# Patient Record
Sex: Female | Born: 1975 | Race: Black or African American | Hispanic: No | Marital: Married | Smoking: Never smoker
Health system: Southern US, Community
[De-identification: ages and names within clinical notes are randomized; demographics above are authoritative.]

## PROBLEM LIST (undated history)

## (undated) DIAGNOSIS — E119 Type 2 diabetes mellitus without complications: Secondary | ICD-10-CM

## (undated) DIAGNOSIS — Z9221 Personal history of antineoplastic chemotherapy: Secondary | ICD-10-CM

## (undated) DIAGNOSIS — Z923 Personal history of irradiation: Secondary | ICD-10-CM

## (undated) DIAGNOSIS — G709 Myoneural disorder, unspecified: Secondary | ICD-10-CM

## (undated) DIAGNOSIS — C801 Malignant (primary) neoplasm, unspecified: Secondary | ICD-10-CM

## (undated) HISTORY — DX: Myoneural disorder, unspecified: G70.9

## (undated) HISTORY — DX: Type 2 diabetes mellitus without complications: E11.9

## (undated) HISTORY — PX: WISDOM TOOTH EXTRACTION: SHX21

## (undated) HISTORY — PX: MASTECTOMY: SHX3

## (undated) HISTORY — DX: Personal history of irradiation: Z92.3

## (undated) HISTORY — PX: TUBAL LIGATION: SHX77

---

## 2003-09-30 ENCOUNTER — Encounter: Admission: RE | Admit: 2003-09-30 | Discharge: 2003-09-30 | Payer: Self-pay | Admitting: General Surgery

## 2004-10-26 ENCOUNTER — Other Ambulatory Visit: Admission: RE | Admit: 2004-10-26 | Discharge: 2004-10-26 | Payer: Self-pay | Admitting: Family Medicine

## 2005-11-16 ENCOUNTER — Other Ambulatory Visit: Admission: RE | Admit: 2005-11-16 | Discharge: 2005-11-16 | Payer: Self-pay | Admitting: Family Medicine

## 2006-07-18 ENCOUNTER — Emergency Department (HOSPITAL_COMMUNITY): Admission: EM | Admit: 2006-07-18 | Discharge: 2006-07-18 | Payer: Self-pay | Admitting: Pediatrics

## 2007-01-25 ENCOUNTER — Other Ambulatory Visit: Admission: RE | Admit: 2007-01-25 | Discharge: 2007-01-25 | Payer: Self-pay | Admitting: Family Medicine

## 2007-05-28 ENCOUNTER — Other Ambulatory Visit (HOSPITAL_COMMUNITY): Admission: RE | Admit: 2007-05-28 | Discharge: 2007-08-26 | Payer: Self-pay | Admitting: Psychiatry

## 2007-06-01 ENCOUNTER — Ambulatory Visit: Payer: Self-pay | Admitting: Psychiatry

## 2008-01-14 ENCOUNTER — Emergency Department (HOSPITAL_COMMUNITY): Admission: EM | Admit: 2008-01-14 | Discharge: 2008-01-15 | Payer: Self-pay | Admitting: Emergency Medicine

## 2008-07-16 ENCOUNTER — Other Ambulatory Visit: Admission: RE | Admit: 2008-07-16 | Discharge: 2008-07-16 | Payer: Self-pay | Admitting: Obstetrics and Gynecology

## 2008-07-30 ENCOUNTER — Emergency Department (HOSPITAL_COMMUNITY)
Admission: EM | Admit: 2008-07-30 | Discharge: 2008-07-30 | Payer: No Typology Code available for payment source | Admitting: Emergency Medicine

## 2010-02-13 ENCOUNTER — Emergency Department (HOSPITAL_COMMUNITY): Admission: EM | Admit: 2010-02-13 | Discharge: 2010-02-13 | Payer: Self-pay | Admitting: Emergency Medicine

## 2010-05-24 ENCOUNTER — Ambulatory Visit (HOSPITAL_COMMUNITY): Admission: RE | Admit: 2010-05-24 | Discharge: 2010-05-24 | Payer: Self-pay | Admitting: Psychiatry

## 2010-05-26 ENCOUNTER — Other Ambulatory Visit (HOSPITAL_COMMUNITY): Admission: RE | Admit: 2010-05-26 | Discharge: 2010-06-14 | Payer: Self-pay | Admitting: Psychiatry

## 2010-05-27 ENCOUNTER — Ambulatory Visit (HOSPITAL_COMMUNITY): Payer: Self-pay | Admitting: Psychiatry

## 2010-06-15 ENCOUNTER — Ambulatory Visit (HOSPITAL_COMMUNITY): Payer: Self-pay | Admitting: Psychiatry

## 2010-06-17 ENCOUNTER — Ambulatory Visit: Payer: Self-pay | Admitting: Psychiatry

## 2010-06-24 ENCOUNTER — Ambulatory Visit (HOSPITAL_COMMUNITY): Payer: Self-pay | Admitting: Psychiatry

## 2010-07-01 ENCOUNTER — Emergency Department (HOSPITAL_COMMUNITY): Admission: EM | Admit: 2010-07-01 | Discharge: 2010-07-01 | Payer: Self-pay | Admitting: Emergency Medicine

## 2010-07-12 ENCOUNTER — Ambulatory Visit (HOSPITAL_COMMUNITY): Payer: Self-pay | Admitting: Psychiatry

## 2010-07-20 ENCOUNTER — Ambulatory Visit (HOSPITAL_COMMUNITY): Payer: Self-pay | Admitting: Psychiatry

## 2011-01-12 ENCOUNTER — Other Ambulatory Visit (HOSPITAL_COMMUNITY)
Admission: RE | Admit: 2011-01-12 | Discharge: 2011-01-12 | Disposition: A | Discharge status: Home / Self Care | Payer: BC Managed Care – PPO | Source: Ambulatory Visit | Attending: Obstetrics and Gynecology | Admitting: Obstetrics and Gynecology

## 2011-01-12 DIAGNOSIS — Z01419 Encounter for gynecological examination (general) (routine) without abnormal findings: Secondary | ICD-10-CM | POA: Insufficient documentation

## 2011-05-12 LAB — RAPID STREP SCREEN (MED CTR MEBANE ONLY): Streptococcus, Group A Screen (Direct): POSITIVE — AB

## 2011-05-20 LAB — D-DIMER, QUANTITATIVE: D-Dimer, Quant: 0.23 ug/mL-FEU (ref 0.00–0.48)

## 2011-12-04 ENCOUNTER — Encounter (HOSPITAL_COMMUNITY): Payer: Self-pay | Admitting: Emergency Medicine

## 2011-12-04 ENCOUNTER — Emergency Department (HOSPITAL_COMMUNITY): Payer: BC Managed Care – PPO

## 2011-12-04 ENCOUNTER — Emergency Department (HOSPITAL_COMMUNITY)
Admission: EM | Admit: 2011-12-04 | Discharge: 2011-12-04 | Disposition: A | Discharge status: Home / Self Care | Payer: BC Managed Care – PPO | Attending: Emergency Medicine | Admitting: Emergency Medicine

## 2011-12-04 DIAGNOSIS — M25579 Pain in unspecified ankle and joints of unspecified foot: Secondary | ICD-10-CM | POA: Insufficient documentation

## 2011-12-04 DIAGNOSIS — Y9229 Other specified public building as the place of occurrence of the external cause: Secondary | ICD-10-CM | POA: Insufficient documentation

## 2011-12-04 DIAGNOSIS — M79609 Pain in unspecified limb: Secondary | ICD-10-CM | POA: Insufficient documentation

## 2011-12-04 DIAGNOSIS — S92901A Unspecified fracture of right foot, initial encounter for closed fracture: Secondary | ICD-10-CM

## 2011-12-04 DIAGNOSIS — X500XXA Overexertion from strenuous movement or load, initial encounter: Secondary | ICD-10-CM | POA: Insufficient documentation

## 2011-12-04 DIAGNOSIS — S92253A Displaced fracture of navicular [scaphoid] of unspecified foot, initial encounter for closed fracture: Secondary | ICD-10-CM | POA: Insufficient documentation

## 2011-12-04 DIAGNOSIS — S92109A Unspecified fracture of unspecified talus, initial encounter for closed fracture: Secondary | ICD-10-CM | POA: Insufficient documentation

## 2011-12-04 MED ORDER — HYDROCODONE-ACETAMINOPHEN 5-325 MG PO TABS: 1.0000 | ORAL_TABLET | ORAL | Status: AC | PRN

## 2011-12-04 NOTE — Discharge Instructions (Signed)
Take ibuprofen, up to 800mg  three times a day, as needed for pain.  Take vicodin as prescribed for severe pain.   Do not drive within four hours of taking this medication (may cause drowsiness or confusion).  Ice 3 times a day for 15-20 minutes.  Elevate when possible.  Follow up with Dr. Despina Hick.Foot Fracture A fractured foot is a broken bone in your foot. These fractures are usually caused by twisting or crush injuries. Some foot fractures are stress fractures which are due to excess walking or exercise. If the bones are in a good position, foot fractures will usually heal in about 6 weeks. You should keep your foot elevated for the next 2 to 4 days and apply ice packs to the area of the injury for 20 to 30 minutes every 2 to 3 hours until the swelling and pain get better. If you have been placed in a cast or splint, keep it on until you have been checked by your caregiver. Do not walk on a broken foot until bearing weight is relatively painless. Often a cast or podiatric shoe with a stiff sole is used to allow early walking. Repeat X-rays are often needed in 3 to 6 weeks to make sure the fracture is healing. Follow up with your caregiver as recommended.  SEEK IMMEDIATE MEDICAL CARE IF:  You have increased pain, or your toes become cold, numb, or pale.  MAKE SURE YOU:   Understand these instructions.   Will watch your condition.   Will get help right away if you are not doing well or get worse.  Document Released: 09/08/2004 Document Revised: 07/21/2011 Document Reviewed: 09/03/2008 [facility] Patient Information 2012 [place], [place]. You may return to the ER if your pain worsens or you have any other concerns.

## 2011-12-04 NOTE — ED Notes (Signed)
Pt states she fell down some steps this morning and injured her right ankle  Pt has swelling noted  Pt able to walk on it with limp

## 2011-12-04 NOTE — ED Notes (Signed)
Pt transported to xray 

## 2011-12-04 NOTE — ED Provider Notes (Signed)
History     CSN: [id number]  Arrival date & time 12/04/11  2046   First MD Initiated Contact with Patient 12/04/11 2135      Chief Complaint  Patient presents with  . Fall  . Ankle Injury    (Consider location/radiation/quality/duration/timing/severity/associated sxs/prior treatment) HPI History provided by pt.   Pt was walking down the stairs in heels at church at 10am this morning, missed a step and twisted her right ankle.  Has had severe pain that shoots from forefoot up her leg ever since.  Aggravated by bearing weight and no relief w/ ibuprofen.  Associated w/ edema; no paresthesias.   History reviewed. No pertinent past medical history.  History reviewed. No pertinent past surgical history.  Family History  Problem Relation Age of Onset  . Diabetes Brother     History  Substance Use Topics  . Smoking status: Never Smoker   . Smokeless tobacco: Not on file  . Alcohol Use: No    OB History    Grav Para Term Preterm Abortions TAB SAB Ect Mult Living                  Review of Systems  All other systems reviewed and are negative.    Allergies  Review of patient's allergies indicates no known allergies.  Home Medications   Current Outpatient Rx  Name Route Sig Dispense Refill  . IBUPROFEN 200 MG PO TABS Oral Take 800 mg by mouth every 6 (six) hours as needed. Ankle pain      BP 114/91  Pulse 84  Temp(Src) 98.8 F (37.1 C) (Oral)  Resp 18  SpO2 100%  LMP 11/06/2011  Physical Exam  Nursing note and vitals reviewed. Constitutional: She is oriented to person, place, and time. She appears well-developed and well-nourished. No distress.  HENT:  Head: Normocephalic and atraumatic.  Eyes:       Normal appearance  Neck: Normal range of motion.  Musculoskeletal:       Right ankle and foot w/out deformity.  Edema of dorsal surface of foot and lateral ankle.  Tenderness over 1st-3rd metatarsals and entire lateral ankle w/ guarding.  Pain w/ minimal  passive ROM ankle.  Full active ROM toes.  2+ DP pulse and distal sensation intact.   Neurological: She is alert and oriented to person, place, and time.  Psychiatric: She has a normal mood and affect. Her behavior is normal.    ED Course  Procedures (including critical care time)  Labs Reviewed - No data to display Dg Ankle Complete Right  12/04/2011  *RADIOLOGY REPORT*  Clinical Data: Lateral right foot and ankle pain after fall.  RIGHT ANKLE - COMPLETE 3+ VIEW  Comparison: None.  Findings: Mild soft tissue swelling.  Tiny osseous densities projecting superior to the distal talus and proximal navicular suggesting avulsion fragments.  No displaced fractures of the ankle are identified.  No focal bone lesion or bone destruction.  The no radiopaque soft tissue foreign bodies.  Minimal Achilles calcaneal spur.  IMPRESSION: Tiny bone fragments over the dorsal aspect of the distal talus and proximal navicular suggesting avulsion fractures.  Original Report Authenticated By: Marlon Pel, M.D.   Dg Foot Complete Right  12/04/2011  *RADIOLOGY REPORT*  Clinical Data: Lateral right foot and ankle pain after fall.  RIGHT FOOT COMPLETE - 3+ VIEW  Comparison: None.  Findings: Small osseous fragments again demonstrated over the distal talus and proximal navicular consistent with avulsion fractures.  Suggestion of focal  cortical irregularity of the distal aspect of the proximal phalanx of the fifth toe which may represent normal variation.  If the patient is tender in this area, nondisplaced fracture is not excluded.  No additional fractures demonstrated.  No focal bone lesion or bone destruction.  No radiopaque soft tissue foreign bodies.  IMPRESSION: Small avulsion fracture fragments demonstrated over the dorsal aspect of the distal talus and proximal navicular.  The focal irregularity of the cortex of the distal aspect of the proximal phalanx of the fifth toe which might represent normal variation versus  nondisplaced fracture.  Clinical correlation with site of pain is recommended.  Original Report Authenticated By: Marlon Pel, M.D.     1. Fracture of right foot       MDM  Pt presents w/ right foot/ankle injury.  Significant tenderness and edema and limited ankle ROM on exam.  No NV deficits.  Xrays ordered to r/o fracture/dislocation and show tiny avulsion fracture of talus vs. Navicular bone as well as possible non-displaced fx of distal aspect of proximal 5th right phalanx.  On re-examination, pt does have tenderness in this location.  Ortho tech placed in short leg and stirrup splint and provided her with crutches.  Pt d/c'd home w/ vicodin and referral to ortho.  Recommended NSAID, ice and elevation.          Otilio Miu, [place] 12/04/11 2253

## 2011-12-04 NOTE — ED Provider Notes (Signed)
Medical screening examination/treatment/procedure(s) were performed by non-physician practitioner and as supervising physician I was immediately available for consultation/collaboration.  Toy Baker, MD 12/04/11 2325

## 2011-12-04 NOTE — ED Notes (Signed)
Otho tech contacted

## 2012-02-09 ENCOUNTER — Emergency Department (HOSPITAL_COMMUNITY)
Admission: EM | Admit: 2012-02-09 | Discharge: 2012-02-10 | Disposition: A | Discharge status: Home / Self Care | Payer: BC Managed Care – PPO | Attending: Emergency Medicine | Admitting: Emergency Medicine

## 2012-02-09 ENCOUNTER — Encounter (HOSPITAL_COMMUNITY): Payer: Self-pay | Admitting: *Deleted

## 2012-02-09 DIAGNOSIS — N949 Unspecified condition associated with female genital organs and menstrual cycle: Secondary | ICD-10-CM | POA: Insufficient documentation

## 2012-02-09 DIAGNOSIS — N898 Other specified noninflammatory disorders of vagina: Secondary | ICD-10-CM | POA: Insufficient documentation

## 2012-02-09 DIAGNOSIS — R102 Pelvic and perineal pain: Secondary | ICD-10-CM

## 2012-02-09 LAB — CBC WITH DIFFERENTIAL/PLATELET
Basophils Absolute: 0 10*3/uL (ref 0.0–0.1)
Basophils Relative: 0 % (ref 0–1)
Eosinophils Absolute: 0.1 10*3/uL (ref 0.0–0.7)
Eosinophils Relative: 1 % (ref 0–5)
HCT: 37.3 % (ref 36.0–46.0)
Hemoglobin: 12 g/dL (ref 12.0–15.0)
Lymphocytes Relative: 39 % (ref 12–46)
Lymphs Abs: 3.3 10*3/uL (ref 0.7–4.0)
MCH: 26.7 pg (ref 26.0–34.0)
MCHC: 32.2 g/dL (ref 30.0–36.0)
MCV: 83.1 fL (ref 78.0–100.0)
Monocytes Absolute: 0.5 10*3/uL (ref 0.1–1.0)
Monocytes Relative: 6 % (ref 3–12)
Neutro Abs: 4.6 10*3/uL (ref 1.7–7.7)
Neutrophils Relative %: 54 % (ref 43–77)
Platelets: 228 10*3/uL (ref 150–400)
RBC: 4.49 MIL/uL (ref 3.87–5.11)
RDW: 13.8 % (ref 11.5–15.5)
WBC: 8.4 10*3/uL (ref 4.0–10.5)

## 2012-02-09 LAB — COMPREHENSIVE METABOLIC PANEL
ALT: 10 U/L (ref 0–35)
AST: 10 U/L (ref 0–37)
Albumin: 3.7 g/dL (ref 3.5–5.2)
Alkaline Phosphatase: 46 U/L (ref 39–117)
BUN: 14 mg/dL (ref 6–23)
CO2: 24 mEq/L (ref 19–32)
Calcium: 9.8 mg/dL (ref 8.4–10.5)
Chloride: 99 mEq/L (ref 96–112)
Creatinine, Ser: 0.57 mg/dL (ref 0.50–1.10)
GFR calc Af Amer: >90 mL/min (ref >90)
GFR calc non Af Amer: >90 mL/min (ref >90)
Glucose, Bld: 288 mg/dL — ABNORMAL HIGH (ref 70–99)
Potassium: 4 mEq/L (ref 3.5–5.1)
Sodium: 134 mEq/L — ABNORMAL LOW (ref 135–145)
Total Bilirubin: 0.3 mg/dL (ref 0.3–1.2)
Total Protein: 7 g/dL (ref 6.0–8.3)

## 2012-02-09 MED ORDER — HYDROCODONE-ACETAMINOPHEN 5-325 MG PO TABS
2.0000 | ORAL_TABLET | Freq: Once | ORAL | Status: AC
  Administered 2012-02-10: 2 via ORAL
  Filled 2012-02-09: qty 2

## 2012-02-09 NOTE — ED Notes (Signed)
Pt states she started to have left pelvic pain about 4 days ago. Pt states is having burning with urination. Pt denies any other symptoms

## 2012-02-09 NOTE — ED Provider Notes (Signed)
History     CSN: [id number]  Arrival date & time 02/09/12  2210   First MD Initiated Contact with Patient 02/09/12 2344      Chief Complaint  Patient presents with  . Pelvic Pain    (Consider location/radiation/quality/duration/timing/severity/associated sxs/prior treatment) Patient is a 36 y.o. female presenting with pelvic pain. The history is provided by the patient.  Pelvic Pain Pertinent negatives include no chest pain, no headaches and no shortness of breath.  pt c/o pelvic pain for past 3-4 days. Constant, dull, non radiating. No back or flank pain. Mild dysuria and vaginal discharge. States had normal period approx 2 weeks ago. ?hx ovarian cyst, pt unsure if same. Denies hx fibroids or endometriosis. No abd trauma. Having normal bms. Normal appetite. No nvd. No fever or chills.     History reviewed. No pertinent past medical history.  History reviewed. No pertinent past surgical history.  Family History  Problem Relation Age of Onset  . Diabetes Brother     History  Substance Use Topics  . Smoking status: Never Smoker   . Smokeless tobacco: Not on file  . Alcohol Use: No    OB History    Grav Para Term Preterm Abortions TAB SAB Ect Mult Living                  Review of Systems  Constitutional: Negative for fever and chills.  HENT: Negative for neck pain.   Eyes: Negative for redness.  Respiratory: Negative for cough and shortness of breath.   Cardiovascular: Negative for chest pain.  Gastrointestinal: Negative for vomiting, diarrhea and constipation.  Genitourinary: Positive for pelvic pain. Negative for flank pain.  Musculoskeletal: Negative for back pain.  Skin: Negative for rash.  Neurological: Negative for headaches.  Hematological: Does not bruise/bleed easily.  Psychiatric/Behavioral: Negative for confusion.    Allergies  Review of patient's allergies indicates no known allergies.  Home Medications  No current outpatient prescriptions on  file.  BP 130/84  Pulse 94  Temp 98.1 F (36.7 C) (Oral)  Resp 18  SpO2 98%  LMP 01/25/2012  Physical Exam  Nursing note and vitals reviewed. Constitutional: She is oriented to person, place, and time. She appears well-developed and well-nourished. No distress.  Eyes: Conjunctivae are normal. No scleral icterus.  Neck: Neck supple. No tracheal deviation present.  Cardiovascular: Normal rate.   Pulmonary/Chest: Effort normal. No respiratory distress.  Abdominal: Soft. Normal appearance and bowel sounds are normal. She exhibits no distension and no mass. There is no tenderness. There is no rebound and no guarding.       No hernias.   Genitourinary:       No cva tenderness. Ext gen normal. Mild whitish discharge. No cmt. No focal adn tenderness.   Musculoskeletal: She exhibits no edema and no tenderness.  Neurological: She is alert and oriented to person, place, and time.  Skin: Skin is warm and dry. No rash noted.  Psychiatric: She has a normal mood and affect.    ED Course  Procedures (including critical care time)   Labs Reviewed  CBC WITH DIFFERENTIAL  COMPREHENSIVE METABOLIC PANEL  URINALYSIS, ROUTINE W REFLEX MICROSCOPIC  PREGNANCY, URINE  WET PREP, GENITAL  GC/CHLAMYDIA PROBE AMP, GENITAL    Results for orders placed during the hospital encounter of 02/09/12  CBC WITH DIFFERENTIAL      Component Value Range   WBC 8.4  4.0 - 10.5 K/uL   RBC 4.49  3.87 - 5.11 MIL/uL  Hemoglobin 12.0  12.0 - 15.0 g/dL   HCT 56.2  13.0 - 86.5 %   MCV 83.1  78.0 - 100.0 fL   MCH 26.7  26.0 - 34.0 pg   MCHC 32.2  30.0 - 36.0 g/dL   RDW 78.4  69.6 - 29.5 %   Platelets 228  150 - 400 K/uL   Neutrophils Relative 54  43 - 77 %   Neutro Abs 4.6  1.7 - 7.7 K/uL   Lymphocytes Relative 39  12 - 46 %   Lymphs Abs 3.3  0.7 - 4.0 K/uL   Monocytes Relative 6  3 - 12 %   Monocytes Absolute 0.5  0.1 - 1.0 K/uL   Eosinophils Relative 1  0 - 5 %   Eosinophils Absolute 0.1  0.0 - 0.7 K/uL    Basophils Relative 0  0 - 1 %   Basophils Absolute 0.0  0.0 - 0.1 K/uL  COMPREHENSIVE METABOLIC PANEL      Component Value Range   Sodium 134 (*) 135 - 145 mEq/L   Potassium 4.0  3.5 - 5.1 mEq/L   Chloride 99  96 - 112 mEq/L   CO2 24  19 - 32 mEq/L   Glucose, Bld 288 (*) 70 - 99 mg/dL   BUN 14  6 - 23 mg/dL   Creatinine, Ser 2.84  0.50 - 1.10 mg/dL   Calcium 9.8  8.4 - 13.2 mg/dL   Total Protein 7.0  6.0 - 8.3 g/dL   Albumin 3.7  3.5 - 5.2 g/dL   AST 10  0 - 37 U/L   ALT 10  0 - 35 U/L   Alkaline Phosphatase 46  39 - 117 U/L   Total Bilirubin 0.3  0.3 - 1.2 mg/dL   GFR calc non Af Amer >90  >90 mL/min   GFR calc Af Amer >90  >90 mL/min  URINALYSIS, ROUTINE W REFLEX MICROSCOPIC      Component Value Range   Color, Urine YELLOW  YELLOW   APPearance CLOUDY (*) CLEAR   Specific Gravity, Urine 1.042 (*) 1.005 - 1.030   pH 6.0  5.0 - 8.0   Glucose, UA >1000 (*) NEGATIVE mg/dL   Hgb urine dipstick NEGATIVE  NEGATIVE   Bilirubin Urine NEGATIVE  NEGATIVE   Ketones, ur TRACE (*) NEGATIVE mg/dL   Protein, ur NEGATIVE  NEGATIVE mg/dL   Urobilinogen, UA 1.0  0.0 - 1.0 mg/dL   Nitrite NEGATIVE  NEGATIVE   Leukocytes, UA NEGATIVE  NEGATIVE  WET PREP, GENITAL      Component Value Range   Yeast Wet Prep HPF POC NONE SEEN  NONE SEEN   Trich, Wet Prep NONE SEEN  NONE SEEN   Clue Cells Wet Prep HPF POC NONE SEEN  NONE SEEN   WBC, Wet Prep HPF POC FEW (*) NONE SEEN  URINE MICROSCOPIC-ADD ON      Component Value Range   Squamous Epithelial / LPF RARE  RARE   WBC, UA 0-2  <3 WBC/hpf   RBC / HPF 0-2  <3 RBC/hpf   Bacteria, UA RARE  RARE  POCT PREGNANCY, URINE      Component Value Range   Preg Test, Ur NEGATIVE  NEGATIVE   US Transvaginal Non-ob  02/10/2012  *RADIOLOGY REPORT*  Clinical Data:  Right-sided pelvic pain.  TRANSABDOMINAL AND TRANSVAGINAL ULTRASOUND OF PELVIS DOPPLER ULTRASOUND OF OVARIES  Technique:  Both transabdominal and transvaginal ultrasound examinations of the pelvis were  performed. Transabdominal  technique was performed for global imaging of the pelvis including uterus, ovaries, adnexal regions, and pelvic cul-de-sac.  It was necessary to proceed with endovaginal exam following the transabdominal exam to visualize the ovaries and endometrium.  Color and duplex Doppler ultrasound was utilized to evaluate blood flow to the ovaries.  Comparison:  None.  Findings:  Uterus:  The uterus is retroverted and measures 8.3 x 5 x 5.5 cm. Small cysts in the cervix consistent with Nabothian cyst.  Endometrium:  Normal endometrial stripe thickness measured at 0.9 mm.  Right ovary: The right ovary measures 3.8 x 2.5 x 2.1 cm.  Normal follicular changes.  Flow is demonstrated within the ovary on color flow Doppler imaging.  Left ovary:     The left ovary measures 3.4 x 1.6 x 2.2 cm.  Normal follicular changes.  Flow is demonstrated in the left ovary using color flow Doppler imaging.  Small amount of free fluid in the pelvis.  Pulsed Doppler evaluation demonstrates normal low-resistance arterial and venous waveforms in both ovaries.  IMPRESSION: Free fluid in the pelvis.  Otherwise normal exam.  No evidence of pelvic mass or other significant abnormality.  No sonographic evidence for ovarian torsion.  Original Report Authenticated By: Marlon Pel, M.D.   US Pelvis Complete  02/10/2012  *RADIOLOGY REPORT*  Clinical Data:  Right-sided pelvic pain.  TRANSABDOMINAL AND TRANSVAGINAL ULTRASOUND OF PELVIS DOPPLER ULTRASOUND OF OVARIES  Technique:  Both transabdominal and transvaginal ultrasound examinations of the pelvis were performed. Transabdominal technique was performed for global imaging of the pelvis including uterus, ovaries, adnexal regions, and pelvic cul-de-sac.  It was necessary to proceed with endovaginal exam following the transabdominal exam to visualize the ovaries and endometrium.  Color and duplex Doppler ultrasound was utilized to evaluate blood flow to the ovaries.  Comparison:   None.  Findings:  Uterus:  The uterus is retroverted and measures 8.3 x 5 x 5.5 cm. Small cysts in the cervix consistent with Nabothian cyst.  Endometrium:  Normal endometrial stripe thickness measured at 0.9 mm.  Right ovary: The right ovary measures 3.8 x 2.5 x 2.1 cm.  Normal follicular changes.  Flow is demonstrated within the ovary on color flow Doppler imaging.  Left ovary:     The left ovary measures 3.4 x 1.6 x 2.2 cm.  Normal follicular changes.  Flow is demonstrated in the left ovary using color flow Doppler imaging.  Small amount of free fluid in the pelvis.  Pulsed Doppler evaluation demonstrates normal low-resistance arterial and venous waveforms in both ovaries.  IMPRESSION: Free fluid in the pelvis.  Otherwise normal exam.  No evidence of pelvic mass or other significant abnormality.  No sonographic evidence for ovarian torsion.  Original Report Authenticated By: Marlon Pel, M.D.   [place] Art/ven Flow Abd Pelv Doppler  02/10/2012  *RADIOLOGY REPORT*  Clinical Data:  Right-sided pelvic pain.  TRANSABDOMINAL AND TRANSVAGINAL ULTRASOUND OF PELVIS DOPPLER ULTRASOUND OF OVARIES  Technique:  Both transabdominal and transvaginal ultrasound examinations of the pelvis were performed. Transabdominal technique was performed for global imaging of the pelvis including uterus, ovaries, adnexal regions, and pelvic cul-de-sac.  It was necessary to proceed with endovaginal exam following the transabdominal exam to visualize the ovaries and endometrium.  Color and duplex Doppler ultrasound was utilized to evaluate blood flow to the ovaries.  Comparison:  None.  Findings:  Uterus:  The uterus is retroverted and measures 8.3 x 5 x 5.5 cm. Small cysts in the cervix consistent with Nabothian  cyst.  Endometrium:  Normal endometrial stripe thickness measured at 0.9 mm.  Right ovary: The right ovary measures 3.8 x 2.5 x 2.1 cm.  Normal follicular changes.  Flow is demonstrated within the ovary on color flow Doppler  imaging.  Left ovary:     The left ovary measures 3.4 x 1.6 x 2.2 cm.  Normal follicular changes.  Flow is demonstrated in the left ovary using color flow Doppler imaging.  Small amount of free fluid in the pelvis.  Pulsed Doppler evaluation demonstrates normal low-resistance arterial and venous waveforms in both ovaries.  IMPRESSION: Free fluid in the pelvis.  Otherwise normal exam.  No evidence of pelvic mass or other significant abnormality.  No sonographic evidence for ovarian torsion.  Original Report Authenticated By: Marlon Pel, M.D.      MDM  Labs. Pt says family member here who can drive her home. vicodin po.   Pt notes hx ovarian cyst. Pain initially persisting. U/s ordered.  Recheck -  Pt comfortable. Denies pain. No nv. abd soft non tender on re-exam.   Discussed close pcp follow up with pt and to return to ER if worsening or increased pain, fevers, vomiting.         Suzi Roots, MD 02/10/12 [phone]

## 2012-02-10 ENCOUNTER — Emergency Department (HOSPITAL_COMMUNITY): Payer: BC Managed Care – PPO

## 2012-02-10 LAB — URINALYSIS, ROUTINE W REFLEX MICROSCOPIC
Bilirubin Urine: NEGATIVE
Glucose, UA: >1000 mg/dL — AB
Hgb urine dipstick: NEGATIVE
Leukocytes, UA: NEGATIVE
Nitrite: NEGATIVE
Protein, ur: NEGATIVE mg/dL
Specific Gravity, Urine: 1.042 — ABNORMAL HIGH (ref 1.005–1.030)
Urobilinogen, UA: 1 mg/dL (ref 0.0–1.0)
pH: 6 (ref 5.0–8.0)

## 2012-02-10 LAB — POCT PREGNANCY, URINE: Preg Test, Ur: NEGATIVE

## 2012-02-10 LAB — WET PREP, GENITAL
Clue Cells Wet Prep HPF POC: NONE SEEN
Trich, Wet Prep: NONE SEEN
Yeast Wet Prep HPF POC: NONE SEEN

## 2012-02-10 LAB — URINE MICROSCOPIC-ADD ON

## 2012-02-10 MED ORDER — IBUPROFEN 600 MG PO TABS: 600.0000 mg | ORAL_TABLET | Freq: Three times a day (TID) | ORAL | Status: AC | PRN

## 2012-02-10 NOTE — ED Notes (Signed)
Dr. Denton Lank in room to perform pelvic exam, accompanied by nurse tech. Pt tolerated well.

## 2012-02-10 NOTE — Discharge Instructions (Signed)
You may take motrin as need for pain.  Follow up with primary care doctor, or here, for recheck in 1 days time if symptoms fail to improve/resolve (return right away if worse, worsening or severe abdominal pain, persistent vomiting, fevers, other concern). Your blood sugar is high today (288). Drink plenty of water, follow diabetic diet, and follow up with primary care doctor for recheck in the next couple days  - discuss this result with them. You were given pain medication in the ER - no driving for the next 4 hours.    Abdominal Pain, Women Abdominal (stomach, pelvic, or belly) pain can be caused by many things. It is important to tell your doctor:  The location of the pain.   Does it come and go or is it present all the time?   Are there things that start the pain (eating certain foods, exercise)?   Are there other symptoms associated with the pain (fever, nausea, vomiting, diarrhea)?  All of this is helpful to know when trying to find the cause of the pain. CAUSES   Stomach: virus or bacteria infection, or ulcer.   Intestine: appendicitis (inflamed appendix), regional ileitis (Crohn's disease), ulcerative colitis (inflamed colon), irritable bowel syndrome, diverticulitis (inflamed diverticulum of the colon), or cancer of the stomach or intestine.   Gallbladder disease or stones in the gallbladder.   Kidney disease, kidney stones, or infection.   Pancreas infection or cancer.   Fibromyalgia (pain disorder).   Diseases of the female organs:   Uterus: fibroid (non-cancerous) tumors or infection.   Fallopian tubes: infection or tubal pregnancy.   Ovary: cysts or tumors.   Pelvic adhesions (scar tissue).   Endometriosis (uterus lining tissue growing in the pelvis and on the pelvic organs).   Pelvic congestion syndrome (female organs filling up with blood just before the menstrual period).   Pain with the menstrual period.   Pain with ovulation (producing an egg).    Pain with an IUD (intrauterine device, birth control) in the uterus.   Cancer of the female organs.   Functional pain (pain not caused by a disease, may improve without treatment).   Psychological pain.   Depression.  DIAGNOSIS  Your doctor will decide the seriousness of your pain by doing an examination.  Blood tests.   X-rays.   Ultrasound.   CT scan (computed tomography, special type of X-ray).   MRI (magnetic resonance imaging).   Cultures, for infection.   Barium enema (dye inserted in the large intestine, to better view it with X-rays).   Colonoscopy (looking in intestine with a lighted tube).   Laparoscopy (minor surgery, looking in abdomen with a lighted tube).   Major abdominal exploratory surgery (looking in abdomen with a large incision).  TREATMENT  The treatment will depend on the cause of the pain.   Many cases can be observed and treated at home.   Over-the-counter medicines recommended by your caregiver.   Prescription medicine.   Antibiotics, for infection.   Birth control pills, for painful periods or for ovulation pain.   Hormone treatment, for endometriosis.   Nerve blocking injections.   Physical therapy.   Antidepressants.   Counseling with a psychologist or psychiatrist.   Minor or major surgery.  HOME CARE INSTRUCTIONS   Do not take laxatives, unless directed by your caregiver.   Take over-the-counter pain medicine only if ordered by your caregiver. Do not take aspirin because it can cause an upset stomach or bleeding.   Try a  clear liquid diet (broth or water) as ordered by your caregiver. Slowly move to a bland diet, as tolerated, if the pain is related to the stomach or intestine.   Have a thermometer and take your temperature several times a day, and record it.   Bed rest and sleep, if it helps the pain.   Avoid sexual intercourse, if it causes pain.   Avoid stressful situations.   Keep your follow-up appointments  and tests, as your caregiver orders.   If the pain does not go away with medicine or surgery, you may try:   Acupuncture.   Relaxation exercises (yoga, meditation).   Group therapy.   Counseling.  SEEK MEDICAL CARE IF:   You notice certain foods cause stomach pain.   Your home care treatment is not helping your pain.   You need stronger pain medicine.   You want your IUD removed.   You feel faint or lightheaded.   You develop nausea and vomiting.   You develop a rash.   You are having side effects or an allergy to your medicine.  SEEK IMMEDIATE MEDICAL CARE IF:   Your pain does not go away or gets worse.   You have a fever.   Your pain is felt only in portions of the abdomen. The right side could possibly be appendicitis. The left lower portion of the abdomen could be colitis or diverticulitis.   You are passing blood in your stools (bright red or black tarry stools, with or without vomiting).   You have blood in your urine.   You develop chills, with or without a fever.   You pass out.  MAKE SURE YOU:   Understand these instructions.   Will watch your condition.   Will get help right away if you are not doing well or get worse.  Document Released: 05/29/2007 Document Revised: 07/21/2011 Document Reviewed: 06/18/2009 [facility] Patient Information 2012 [place], [place].       Hyperglycemia Hyperglycemia occurs when the glucose (sugar) in your blood is too high. Hyperglycemia can happen for many reasons, but it most often happens to people who do not know they have diabetes or are not managing their diabetes properly.  CAUSES  Whether you have diabetes or not, there are other causes of hyperglycemia. Hyperglycemia can occur when you have diabetes, but it can also occur in other situations that you might not be as aware of, such as: Diabetes  If you have diabetes and are having problems controlling your blood glucose, hyperglycemia could occur because of  some of the following reasons:   Not following your meal plan.   Not taking your diabetes medications or not taking it properly.   Exercising less or doing less activity than you normally do.   Being sick.  Pre-diabetes  This cannot be ignored. Before people develop Type 2 diabetes, they almost always have "pre-diabetes." This is when your blood glucose levels are higher than normal, but not yet high enough to be diagnosed as diabetes. Research has shown that some long-term damage to the body, especially the heart and circulatory system, may already be occurring during pre-diabetes. If you take action to manage your blood glucose when you have pre-diabetes, you may delay or prevent Type 2 diabetes from developing.  Stress  If you have diabetes, you may be "diet" controlled or on oral medications or insulin to control your diabetes. However, you may find that your blood glucose is higher than usual in the hospital whether you have  diabetes or not. This is often referred to as "stress hyperglycemia." Stress can elevate your blood glucose. This happens because of hormones put out by the body during times of stress. If stress has been the cause of your high blood glucose, it can be followed regularly by your caregiver. That way he/she can make sure your hyperglycemia does not continue to get worse or progress to diabetes.  Steroids  Steroids are medications that act on the infection fighting system (immune system) to block inflammation or infection. One side effect can be a rise in blood glucose. Most people can produce enough extra insulin to allow for this rise, but for those who cannot, steroids make blood glucose levels go even higher. It is not unusual for steroid treatments to "uncover" diabetes that is developing. It is not always possible to determine if the hyperglycemia will go away after the steroids are stopped. A special blood test called an A1c is sometimes done to determine if your blood  glucose was elevated before the steroids were started.  SYMPTOMS  Thirsty.   Frequent urination.   Dry mouth.   Blurred vision.   Tired or fatigue.   Weakness.   Sleepy.   Tingling in feet or leg.  DIAGNOSIS  Diagnosis is made by monitoring blood glucose in one or all of the following ways:  A1c test. This is a chemical found in your blood.   Fingerstick blood glucose monitoring.   Laboratory results.  TREATMENT  First, knowing the cause of the hyperglycemia is important before the hyperglycemia can be treated. Treatment may include, but is not be limited to:  Education.   Change or adjustment in medications.   Change or adjustment in meal plan.   Treatment for an illness, infection, etc.   More frequent blood glucose monitoring.   Change in exercise plan.   Decreasing or stopping steroids.   Lifestyle changes.  HOME CARE INSTRUCTIONS   Test your blood glucose as directed.   Exercise regularly. Your caregiver will give you instructions about exercise. Pre-diabetes or diabetes which comes on with stress is helped by exercising.   Eat wholesome, balanced meals. Eat often and at regular, fixed times. Your caregiver or nutritionist will give you a meal plan to guide your sugar intake.   Being at an ideal weight is important. If needed, losing as little as 10 to 15 pounds may help improve blood glucose levels.  SEEK MEDICAL CARE IF:   You have questions about medicine, activity, or diet.   You continue to have symptoms (problems such as increased thirst, urination, or weight gain).  SEEK IMMEDIATE MEDICAL CARE IF:   You are vomiting or have diarrhea.   Your breath smells fruity.   You are breathing faster or slower.   You are very sleepy or incoherent.   You have numbness, tingling, or pain in your feet or hands.   You have chest pain.   Your symptoms get worse even though you have been following your caregiver's orders.   If you have any other  questions or concerns.  Document Released: 01/25/2001 Document Revised: 07/21/2011 Document Reviewed: 03/23/2009 [facility] Patient Information 2012 [place], [place].     1800 Calorie Diabetic Diet The 1800 calorie diabetic diet is designed for eating up to 1800 calories each day. Following this diet and making healthy meal choices can help improve overall health. It controls blood glucose (sugar) levels, and it can also help lower blood pressure and cholesterol. SERVING SIZES Measuring foods and  serving sizes helps to make sure you are getting the right amount of food. The list below tells how big or small some common serving sizes are:  1 oz.........4 stacked dice.   3 oz........Marland KitchenDeck of cards.   1 tsp.......Marland KitchenTip of little finger.   1 tbs......Marland KitchenMarland KitchenThumb.   2 tbs.......Marland KitchenGolf ball.    cup......Marland KitchenHalf of a fist.   1 cup.......Marland KitchenA fist.  GUIDELINES FOR CHOOSING FOODS The goal of this diet is to eat a variety of foods and limit calories to 1800 each day. This can be done by choosing foods that are low in calories and fat. The diet also suggests eating small amounts of food frequently. Doing this helps control your blood glucose levels so they do not get too high or too low. Each meal or snack may include a protein food source to help you feel more satisfied. Try to eat about the same amount of food around the same time each day. This includes weekend days, travel days, and days off work. Space your meals about 4 to 5 hours apart, and add a snack between them, if you wish.  For example, a daily food plan could include breakfast, a morning snack, lunch, dinner, and an evening snack. Healthy meals and snacks have different types of foods, including whole grains, vegetables, fruits, lean meats, poultry, fish, and dairy products. As you plan your meals, select a variety of foods. Choose from the bread and starch, vegetable, fruit, dairy, and meat/protein groups. Examples of foods from each group are  listed below with their suggested serving sizes. Use measuring cups and spoons to become familiar with what a healthy portion looks like. Bread and Starch Each serving equals 15 grams of carbohydrates.  1 slice bread.    bagel.    cup cold cereal (unsweetened).    cup hot cereal or mashed potatoes.   1 small potato (size of a computer mouse).   ? cup cooked pasta or rice.    English muffin.   1 cup broth-based soup.   3 cups of popcorn.   4 to 6 whole-wheat crackers.    cup cooked beans, peas, or corn.  Vegetables Each serving equals 5 grams of carbohydrates.   cup cooked vegetables.   1 cup raw vegetables.    cup tomato or vegetable juice.  Fruit Each serving equals 15 grams of carbohydrates.  1 small apple or orange.   1  cup watermelon or strawberries.    cup applesauce (no sugar added).   2 tbs raisins.    banana.    cup canned fruit, packed in water or in its own juice.    cup unsweetened fruit juice.  Dairy Each serving equals 12 to 15 grams of carbohydrates.  1 cup fat-free milk.   6 oz artificially sweetened yogurt or plain yogurt.   1 cup low-fat buttermilk.   1 cup soy milk.   1 cup almond milk.  Meat/Protein  1 large egg.   2 to 3 oz meat, poultry, or fish.    cup low-fat cottage cheese.   1 tbs peanut butter.   1 oz low-fat cheese.    cup tuna, packed in water.    cup tofu.  Fat  1 tsp oil.   1 tsp trans-fat-free margarine.   1 tsp butter.   1 tsp mayonnaise.   2 tbs avocado.   1 tbs salad dressing.   1 tbs cream cheese.   2 tbs sour cream.  SAMPLE 1800 CALORIE DIET PLAN  Breakfast   cup unsweetened cereal (1 carb serving).   1 cup fat-free milk (1 carb serving).   1 slice whole-wheat toast (1 carb serving).    small banana (1 carb serving).   1 scrambled egg.   1 tsp trans-fat-free margarine.  Lunch  Tuna sandwich.   2 slices whole-wheat bread (2 carb servings).    cup canned  tuna in water, drained.   1 tbs reduced fat mayonnaise.   1 stalk celery, chopped.   2 slices tomato.   1 lettuce leaf.   1 cup carrot sticks.   24 to 30 seedless grapes (2 carb servings).   6 oz light yogurt (1 carb serving).  Afternoon Snack  3 graham cracker squares (1 carb serving).   1 cup fat-free milk (1 carb serving).   1 tbs peanut butter.  Dinner  3 oz salmon, broiled with 1 tsp oil.   1 cup mashed potatoes (2 carb servings) with 1 tsp trans-fat-free margarine.   1 cup fresh or frozen green beans.   1 cup steamed asparagus.   1 cup fat-free milk (1 carb serving).  Evening Snack  3 cups of air-popped popcorn (1 carb serving).   2 tbs Parmesan cheese.  Meal Plan You can use this worksheet to help you make a daily meal plan based on the 1800 calorie diabetic diet suggestions. If you are using this plan to help you control your blood glucose, you may interchange carbohydrate-containing foods (dairy, starches, and fruits). Select a variety of fresh foods of varying colors and flavors. The total amount of carbohydrate in your meals or snacks is more important than making sure you include all of the food groups every time you eat. Choose from the approximate amount of the following foods to build your day's meals:  8 Starches.   4 Vegetables.   3 Fruits.   2 Dairy.   6 to 7 oz Meat/Protein.   Up to 4 Fats.  Your dietician can use this worksheet to help you decide how many servings and which types of foods are right for you. BREAKFAST Food Group and Servings / Food Choice Starches _______________________________________________________ Dairy __________________________________________________________ Fruit ___________________________________________________________ Meat/Protein ____________________________________________________ Fat ____________________________________________________________ LUNCH Food Group and Servings / Food Choice Starch  _________________________________________________________ Meat/Protein ___________________________________________________ Vegetables _____________________________________________________ Fruit __________________________________________________________ Dairy __________________________________________________________ Fat ____________________________________________________________ Aura Fey Food Group and Servings / Food Choice Starch ________________________________________________________ Meat/Protein ___________________________________________________ Fruit __________________________________________________________ Dairy __________________________________________________________ Laural Golden Food Group and Servings / Food Choice Starches _______________________________________________________ Meat/Protein ___________________________________________________ Dairy __________________________________________________________ Vegetable ______________________________________________________ Fruit ___________________________________________________________ Fat ____________________________________________________________ Lollie Sails Food Group and Servings / Food Choice Fruit __________________________________________________________ Meat/Protein ___________________________________________________ Dairy __________________________________________________________ Starch _________________________________________________________ DAILY TOTALS Starches _________________________ Vegetables _______________________ Fruits ____________________________ Dairy ____________________________ Meat/Protein_____________________ Fats _____________________________ Document Released: 02/21/2005 Document Revised: 07/21/2011 Document Reviewed: 06/17/2011 [employer] Patient Information 2012 [place], [place].       RESOURCE GUIDE  Chronic Pain Problems: Contact [facility]   [phone] Patients need to be referred by their primary care doctor.  Insufficient Money for Medicine: Contact United Way:  call "211" or [facility] [phone].  No Primary Care Doctor: - Call [facility]  [phone] - can help you locate a primary care doctor that  accepts your insurance, provides certain services, etc. - Physician Referral Service- [phone]  Agencies that provide inexpensive medical care: - [facility]  [phone] - [facility]  [phone] - [facility]  [phone] - Women's Clinic  [phone] - Planned Parenthood  [phone] [facility]  [phone]  Medicaid-accepting [facility] Providers: - [facility]- [address]  [phone], Mon-Fri 9am-7pm, Sat 9am-1pm - [facility]- [address] [place], [place]  [phone] - [facility]- [address] [place] [address] [place]  [phone] [facility]- [address]  [phone] - Renaye Rakers- [address] [place], [address], [phone]  Only accepts [place] patients after they have their name  applied to their card  Self Pay (no insurance) in [place]: - Sickle Cell Patients: Dr Willey Blade, [facility]  [address] [place], [phone] - [facility]- [address] [place]  [phone]       [facility] [place]- [address]       -     [facility]- see information above (Speak to [employer] if you do not have insurance)       -  [facility]- [address] [place], [phone]       -  [facility]- [address],  [phone]       -  [facility]- [address] [phone]       -  Dr Julio Sicks-  [address], [place], [phone]       -  [facility]- [address] [phone]       -  [facility]- [address], [phone],  also [address] [phone]       -    [facility]- [address] [place], [phone], 1st & 3rd Saturday   every month, 10am-1pm  1) Find a Doctor and Pay Out of Pocket Although you won't have to find out who is covered by your insurance plan, it is a good idea to ask around and get recommendations. You will then need to call the office and see if the doctor you have chosen will accept you as a new patient and what types of options they offer for patients who are self-pay. Some doctors offer discounts or will set up payment plans for their patients who do not have insurance, but you will need to ask so you aren't surprised when you get to your appointment.  2) Contact Your Local Health Department Not all health departments have doctors that can see patients for sick visits, but many do, so it is worth a call to see if yours does. If you don't know where your local health department is, you can check in your phone book. The CDC also has a tool to help you locate your state's health department, and many state websites also have listings of all of their local health departments.  3) Find a Walk-in Clinic If your illness is not likely to be very severe or complicated, you may want to try a walk in clinic. These are popping up all over the country in pharmacies, drugstores, and shopping centers. They're usually staffed by nurse practitioners or physician assistants that have been trained to treat common illnesses and complaints. They're usually fairly quick and inexpensive. However, if you have serious medical issues or chronic medical problems, these are probably not your best option  STD Testing - [facility] Department of [facility], STD Clinic, [address] [place], phone [phone] or  [phone].  Monday - Friday, call for an appointment. [facility] Department of [employer], STD Clinic, [address], [place], phone [phone]  or [phone].  Monday - Friday, call for an appointment.  Abuse/Neglect: [facility] Child Abuse Hotline 218-815-3337 [facility] Child Abuse Hotline 979-088-4948 (After Hours)  Emergency Shelter:  [facility] 779-330-1474  Maternity Homes: - [facility] [place] [facility] 480-820-2012 - [facility] [phone]  MRSA Hotline #:   780 542 7400  [facility] Resources  [facility] [place]  United Way [facility] Dept. [address]                 [address]         [address] [place] [address]  Blondell Reveal Phone:  [phone]                                  Phone:  [phone]                   Phone:  [phone]  [facility], [phone] - [facility] - [phone]       -     [facility] in [place], [address]                                  [phone], [facility] Child Abuse Hotline 312 870 1119 or 706-033-0817 (After Hours)   Behavioral Health Services  Substance Abuse Resources: - [facility] Drug [facility]  [phone] - [facility] [phone] - [facility] [phone] Floydene Flock [phone] - Residential & Outpatient Substance Abuse Program  [phone]  Psychological Services: [facility]  [phone] Services  [phone] - [facility], [phone] [place]. [address] [place], ACCESS LINE: [phone] or [phone], [email or web address].[email or web address]  Dental Assistance  If unable to pay or uninsured, contact:  [facility] or [facility]. to become qualified for the adult dental clinic.  Patients with Medicaid: [facility] [phone] [address],  [phone] [address], [phone]  If unable to pay, or uninsured, contact [facility] ([phone]) or [facility] Department [phone] in [place], [phone] in [facility]) to become qualified for the adult dental clinic  Other Low-Cost Community Dental Services: - [facility]- [address] [place], [place], [place], [address], [phone], [address]. [address], 2nd and 4th Thursday of the month at 6:30am.  10 clients each day by appointment, can sometimes see walk-in patients if someone does not show for an appointment. [facility]- [address] Ether Griffins [name], [place], [address], [phone] - [facility]- [address] [place], [place], [phone] [facility]- [phone] - [facility]-  [phone] [facility] Department364 [phone]

## 2012-02-10 NOTE — ED Notes (Signed)
Pelvic cart at bedside. 

## 2012-02-11 LAB — GC/CHLAMYDIA PROBE AMP, GENITAL
Chlamydia, DNA Probe: NEGATIVE
GC Probe Amp, Genital: NEGATIVE

## 2012-05-01 ENCOUNTER — Other Ambulatory Visit: Payer: Self-pay | Admitting: Obstetrics & Gynecology

## 2012-05-01 DIAGNOSIS — N644 Mastodynia: Secondary | ICD-10-CM

## 2012-05-02 ENCOUNTER — Encounter: Payer: Self-pay | Admitting: Dietician

## 2012-05-02 ENCOUNTER — Encounter: Payer: BC Managed Care – PPO | Attending: Obstetrics & Gynecology | Admitting: Dietician

## 2012-05-02 ENCOUNTER — Ambulatory Visit
Admission: RE | Admit: 2012-05-02 | Discharge: 2012-05-02 | Disposition: A | Discharge status: Home / Self Care | Payer: BC Managed Care – PPO | Source: Ambulatory Visit | Attending: Obstetrics & Gynecology | Admitting: Obstetrics & Gynecology

## 2012-05-02 VITALS — Ht 68.0 in | Wt 208.4 lb

## 2012-05-02 DIAGNOSIS — E119 Type 2 diabetes mellitus without complications: Secondary | ICD-10-CM

## 2012-05-02 DIAGNOSIS — Z713 Dietary counseling and surveillance: Secondary | ICD-10-CM | POA: Insufficient documentation

## 2012-05-02 DIAGNOSIS — N644 Mastodynia: Secondary | ICD-10-CM

## 2012-05-02 NOTE — Progress Notes (Signed)
Medical Nutrition Therapy:  Appt start time: 1500 end time:  1630.   Assessment:  Primary concerns today: To find out how to take care of myself.  Has had a urinary infection in June and again in the last 2 weeks.  Was found to have a random glucose of 288 mg on the ED blood draw on 02/09/2012.  At that time the urine was positive for a glucose >1000 mg and was positive for ketones. Since April of this year, has lost 50 lb without dieting.  Recent new diagnosis of type 2 diabetes.    HYPOGLYCEMIA:  Gives history of shakey, sweaty, hungry.  Has occurred intermittently.  HYPERGLYCEMIA:  Gives a history of being really tired.  Has to lay down and rest  BLOOD GLUCOSE:  To learn today.  Provided a One Touch Ultra Meter.  Lot [id number]  Exp: 11/2012.  On return demonstration of the meter, her blood glucose was 288 mg/dl.  Instructed to monitor at least 2-3 times per day and to call her MD office with the results of her blood glucose monitoring and obtain a prescription for glucose meter strips and Delica lancets.   MEDICATIONS: No medications, finishing her run of antibiotics today.   DIETARY INTAKE:  Usual eating pattern includes 3 meals and 2-3 snacks per day.  24-hr recall:  B ( AM): 8:30  Sausage, eggs 2, grits 1/2 cup, biscuit.  Pepsi (regular) or water.   Snk ( AM): water through out the day, will drink 3 (16.9 os ) bottles at work each day. Snack of pringles one serving about 10:30 L ( PM): 12:30 subway 12 inch sub (subway melt with roast beef, and ham and cheese, on wheat bread. Drink of coke of pepsi, 16 oz. Snk ( PM): none D ( PM): 6:00-7:00  OR if second job will eat at 9:00 PM chicken, cabbage or mash potatoes with gravy or the mac and cheese.  9:00 Will eat the food at home, chicken or chop and the veggie, and starch Husband cooks.water or juice of regular soda Snk ( PM): cookies  Orreo cookies or the dunkin sticks.  Usually water Beverages: Water, regular soda, regular juice,    Usual physical activity: Walking daily for about 20 minutes.  Estimated energy needs: HT:  68 in  WT: 208.4 lb  BMI: 31.8 kg/m2  Adj WT: 162 lb (73 kg) 1400-1500 calories 160-165 g carbohydrates 105-110 g protein 38-40 g fat  Progress Towards Goal(s):  No progress.   Nutritional Diagnosis:  [place]-2.1 Inpaired nutrition utilization As related to blood glucose metabolism.  As evidenced by recent diagnosis of type 2 diabetes with blood glucose levels in the 280 mg/dl range, and urine + for ketones..    Intervention:  Nutrition Review of the food groups and their individual effects on blood glucose levels.  Recommended use of the carbohydrate diet for blood glucose control  Recommended use of water and other sugar free and diet beverages rather than regular.  Recommended using more complex carbohydrates with fiber and protein to limit glucose excursions.  Review of the food label, carbohydrate exchange system and portion sizes.  Recommended trying to keep carb intake to 45 gm per meal and 15 gm for snacks.  Have protein at all meals and snacks.  Handouts given during visit include:  Living Well with Diabetes  Controlling Blood Glucose  Menu suggestions for 45 gm CHO meals  Yellow card with diet prescription and carb exchange portions.  Snack list  Monitoring/Evaluation:  Dietary intake, exercise, blood glucose levesl, and body weight in 8-12 weeks.  To call with questions.

## 2012-05-20 ENCOUNTER — Encounter: Payer: Self-pay | Admitting: Dietician

## 2012-06-06 ENCOUNTER — Ambulatory Visit: Payer: BC Managed Care – PPO | Admitting: Family Medicine

## 2012-06-06 VITALS — BP 124/82 | HR 92 | Temp 98.0°F | Resp 16 | Ht 67.25 in | Wt 209.6 lb

## 2012-06-06 DIAGNOSIS — Z79899 Other long term (current) drug therapy: Secondary | ICD-10-CM

## 2012-06-06 DIAGNOSIS — IMO0001 Reserved for inherently not codable concepts without codable children: Secondary | ICD-10-CM

## 2012-06-06 DIAGNOSIS — E119 Type 2 diabetes mellitus without complications: Secondary | ICD-10-CM

## 2012-06-06 LAB — POCT GLYCOSYLATED HEMOGLOBIN (HGB A1C): Hemoglobin A1C: 9.2

## 2012-06-06 MED ORDER — METFORMIN HCL 1000 MG PO TABS: 1000.0000 mg | ORAL_TABLET | Freq: Two times a day (BID) | ORAL | Status: DC

## 2012-06-06 NOTE — Patient Instructions (Signed)
Diabetes, Eating Away From Home Sometimes, you might eat in a restaurant or have meals that are prepared by someone else. You can enjoy eating out. However, the portions in restaurants may be much larger than needed. Listed below are some ideas to help you choose foods that will keep your blood glucose (sugar) in better control.  TIPS FOR EATING OUT  Know your meal plan and how many carbohydrate servings you should have at each meal. You may wish to carry a copy of your meal plan in your purse or wallet. Learn the foods included in each food group.  Make a list of restaurants near you that offer healthy choices. Take a copy of the carry-out menus to see what they offer. Then, you can plan what you will order ahead of time.  Become familiar with serving sizes by practicing them at home using measuring cups and spoons. Once you learn to recognize portion sizes, you will be able to correctly estimate the amount of total carbohydrate you are allowed to eat at the restaurant. Ask for a takeout box if the portion is more than you should have. When your food comes, leave the amount you should have on the plate, and put the rest in the takeout box before you start eating.  Plan ahead if your mealtime will be different from usual. Check with your caregiver to find out how to time meals and medicine if you are taking insulin.  Avoid high-fat foods, such as fried foods, cream sauces, high-fat salad dressings, or any added butter or margarine.  Do not be afraid to ask questions. Ask your server about the portion size, cooking methods, ingredients and if items can be substituted. Restaurants do not list all available items on the menu. You can ask for your main entree to be prepared using skim milk, oil instead of butter or margarine, and without gravy or sauces. Ask your waiter or waitress to serve salad dressings, gravy, sauces, margarine, and sour cream on the side. You can then add the amount your meal plan  suggests.  Add more vegetables whenever possible.  Avoid items that are labeled "jumbo," "giant," "deluxe," or "supersized."  You may want to split an entre with someone and order an extra side salad.  Watch for hidden calories in foods like croutons, bacon, or cheese.  Ask your server to take away the bread basket or chips from your table.  Order a dinner salad as an appetizer. You can eat most foods served in a restaurant. Some foods are better choices than others. Breads and Starches  Recommended: All kinds of bread (wheat, rye, white, oatmeal, [place], [place], raisin), hard or soft dinner rolls, frankfurter or hamburger buns, small bagels, small corn or whole-wheat flour tortillas.  Avoid: Frosted or glazed breads, butter rolls, egg or cheese breads, croissants, sweet rolls, pastries, coffee cake, glazed or frosted doughnuts, muffins. Crackers  Recommended: Animal crackers, graham, rye, saltine, oyster, and matzoth crackers. Bread sticks, melba toast, rusks, pretzels, popcorn (without fat), zwieback toast.  Avoid: High-fat snack crackers or chips. Buttered popcorn. Cereals  Recommended: Hot and cold cereals. Whole grains such as oatmeal or shredded wheat are good choices.  Avoid: Sugar-coated or granola type cereals. Potatoes/Pasta/Rice/Beans  Recommended: Order baked, boiled, or mashed potatoes, rice or noodles without added fat, whole beans. Order gravies, butter, margarine, or sauces on the side so you can control the amount you add.  Avoid: Hash browns or fried potatoes. Potatoes, pasta, or rice prepared with cream  or cheese sauce. Potato or pasta salads prepared with large amounts of dressing. Fried beans or fried rice. Vegetables  Recommended: Order steamed, baked, boiled, or stewed vegetables without sauces or extra fat. Ask that sauce be served on the side. If vegetables are not listed on the menu, ask what is available.  Avoid: Vegetables prepared with cream,  butter, or cheese sauce. Fried vegetables. Salad Bars  Recommended: Many of the vegetables at a salad bar are considered "free." Use lemon juice, vinegar, or low-calorie salad dressing (fewer than 20 calories per serving) as "free" dressings for your salad. Look for salad bar ingredients that have no added fat or sugar such as tomatoes, lettuce, cucumbers, broccoli, carrots, onions, and mushrooms.  Avoid: Prepared salads with large amounts of dressing, such as coleslaw, caesar salad, macaroni salad, bean salad, or carrot salad. Fruit  Recommended: Eat fresh fruit or fresh fruit salad without added dressing. A salad bar often offers fresh fruit choices, but canned fruit at a restaurant is usually packed in sugar or syrup.  Avoid: Sweetened canned or frozen fruits, plain or sweetened fruit juice. Fruit salads with dressing, sour cream, or sugar added to them. Meat and Meat Substitutes  Recommended: Order broiled, baked, roasted, or grilled meat, poultry, or fish. Trim off all visible fat. Do not eat the skin of poultry. The size stated on the menu is the raw weight. Meat shrinks by  in cooking (for example, 4 oz raw equals 3 oz cooked meat).  Avoid: Deep-fat fried meat, poultry, or fish. Breaded meats. Eggs  Recommended: Order soft, hard-cooked, poached, or scrambled eggs. Omelets may be okay, depending on what ingredients are added. Egg substitutes are also a good choice.  Avoid: Fried eggs, eggs prepared with cream or cheese sauce. Milk  Recommended: Order low-fat or fat-free milk according to your meal plan. Plain, nonfat yogurt or flavored yogurt with no sugar added may be used as a substitute for milk. Soy milk may also be used.  Avoid: Milk shakes or sweetened milk beverages. Soups and Combination Foods  Recommended: Clear broth or consomm are "free" foods and may be used as an appetizer. Broth-based soups with fat removed count as a starch serving and are preferred over cream  soups. Soups made with beans or split peas may be eaten but count as a starch.  Avoid: Fatty soups, soup made with cream, cheese soup. Combination foods prepared with excessive amounts of fat or with cream or cheese sauces. Desserts and Sweets  Recommended: Ask for fresh fruit. Sponge or angel food cake without icing, ice milk, no sugar added ice cream, sherbet, or frozen yogurt may fit into your meal plan occasionally.  Avoid: Pastries, puddings, pies, cakes with icing, custard, gelatin desserts. Fats and Oils  Recommended: Choose healthy fats such as olive oil, canola oil, or tub margarine, reduced fat or fat-free sour cream, cream cheese, avocado, or nuts.  Avoid: Any fats in excess of your allowed portion. Deep-fried foods or any food with a large amount of fat. Note: Ask for all fats to be served on the side, and limit your portion sizes according to your meal plan. Document Released: 08/01/2005 Document Revised: 10/24/2011 Document Reviewed: 02/19/2009 [facility] Patient Information 2013 [place], [place].   Diabetes Meal Planning Guide The diabetes meal planning guide is a tool to help you plan your meals and snacks. It is important for people with diabetes to manage their blood glucose (sugar) levels. Choosing the right foods and the right amounts throughout your  day will help control your blood glucose. Eating right can even help you improve your blood pressure and reach or maintain a healthy weight. CARBOHYDRATE COUNTING MADE EASY When you eat carbohydrates, they turn to sugar. This raises your blood glucose level. Counting carbohydrates can help you control this level so you feel better. When you plan your meals by counting carbohydrates, you can have more flexibility in what you eat and balance your medicine with your food intake. Carbohydrate counting simply means adding up the total amount of carbohydrate grams in your meals and snacks. Try to eat about the same amount at each meal.  Foods with carbohydrates are listed below. Each portion below is 1 carbohydrate serving or 15 grams of carbohydrates. Ask your dietician how many grams of carbohydrates you should eat at each meal or snack. Grains and Starches  1 slice bread.   English muffin or hotdog/hamburger bun.   cup cold cereal (unsweetened).   cup cooked pasta or rice.   cup starchy vegetables (corn, potatoes, peas, beans, winter squash).  1 tortilla (6 inches).   bagel.  1 waffle or pancake (size of a CD).   cup cooked cereal.  4 to 6 small crackers. *Whole grain is recommended. Fruit  1 cup fresh unsweetened berries, melon, papaya, pineapple.  1 small fresh fruit.   banana or mango.   cup fruit juice (4 oz unsweetened).   cup canned fruit in natural juice or water.  2 tbs dried fruit.  12 to 15 grapes or cherries. Milk and Yogurt  1 cup fat-free or 1% milk.  1 cup soy milk.  6 oz light yogurt with sugar-free sweetener.  6 oz low-fat soy yogurt.  6 oz plain yogurt. Vegetables  1 cup raw or  cup cooked is counted as 0 carbohydrates or a "free" food.  If you eat 3 or more servings at 1 meal, count them as 1 carbohydrate serving. Other Carbohydrates   oz chips or pretzels.   cup ice cream or frozen yogurt.   cup sherbet or sorbet.  2 inch square cake, no frosting.  1 tbs honey, sugar, jam, jelly, or syrup.  2 small cookies.  3 squares of graham crackers.  3 cups popcorn.  6 crackers.  1 cup broth-based soup.  Count 1 cup casserole or other mixed foods as 2 carbohydrate servings.  Foods with less than 20 calories in a serving may be counted as 0 carbohydrates or a "free" food. You may want to purchase a book or computer software that lists the carbohydrate gram counts of different foods. In addition, the nutrition facts panel on the labels of the foods you eat are a good source of this information. The label will tell you how big the serving size is and  the total number of carbohydrate grams you will be eating per serving. Divide this number by 15 to obtain the number of carbohydrate servings in a portion. Remember, 1 carbohydrate serving equals 15 grams of carbohydrate. SERVING SIZES Measuring foods and serving sizes helps you make sure you are getting the right amount of food. The list below tells how big or small some common serving sizes are.  1 oz.........4 stacked dice.  3 oz........Marland KitchenDeck of cards.  1 tsp.......Marland KitchenTip of little finger.  1 tbs......Marland KitchenMarland KitchenThumb.  2 tbs.......Marland KitchenGolf ball.   cup......Marland KitchenHalf of a fist.  1 cup.......Marland KitchenA fist. SAMPLE DIABETES MEAL PLAN Below is a sample meal plan that includes foods from the grain and starches, dairy, vegetable, fruit, and meat  groups. A dietician can individualize a meal plan to fit your calorie needs and tell you the number of servings needed from each food group. However, controlling the total amount of carbohydrates in your meal or snack is more important than making sure you include all of the food groups at every meal. You may interchange carbohydrate containing foods (dairy, starches, and fruits). The meal plan below is an example of a 2000 calorie diet using carbohydrate counting. This meal plan has 17 carbohydrate servings. Breakfast  1 cup oatmeal (2 carb servings).   cup light yogurt (1 carb serving).  1 cup blueberries (1 carb serving).   cup almonds. Snack  1 large apple (2 carb servings).  1 low-fat string cheese stick. Lunch  Chicken breast salad.  1 cup spinach.   cup chopped tomatoes.  2 oz chicken breast, sliced.  2 tbs low-fat [place] dressing.  12 whole-wheat crackers (2 carb servings).  12 to 15 grapes (1 carb serving).  1 cup low-fat milk (1 carb serving). Snack  1 cup carrots.   cup hummus (1 carb serving). Dinner  3 oz broiled salmon.  1 cup brown rice (3 carb servings). Snack  1  cups steamed broccoli (1 carb serving) drizzled with  1 tsp olive oil and lemon juice.  1 cup light pudding (2 carb servings). DIABETES MEAL PLANNING WORKSHEET Your dietician can use this worksheet to help you decide how many servings of foods and what types of foods are right for you.  BREAKFAST Food Group and Servings / Carb Servings Grain/Starches __________________________________ Dairy __________________________________________ Vegetable ______________________________________ Fruit ___________________________________________ Meat __________________________________________ Fat ____________________________________________ LUNCH Food Group and Servings / Carb Servings Grain/Starches ___________________________________ Dairy ___________________________________________ Fruit ____________________________________________ Meat ___________________________________________ Fat _____________________________________________ Laural Golden Food Group and Servings / Carb Servings Grain/Starches ___________________________________ Dairy ___________________________________________ Fruit ____________________________________________ Meat ___________________________________________ Fat _____________________________________________ SNACKS Food Group and Servings / Carb Servings Grain/Starches ___________________________________ Dairy ___________________________________________ Vegetable _______________________________________ Fruit ____________________________________________ Meat ___________________________________________ Fat _____________________________________________ DAILY TOTALS Starches _________________________ Vegetable ________________________ Fruit ____________________________ Dairy ____________________________ Meat ____________________________ Fat ______________________________ Document Released: 04/28/2005 Document Revised: 10/24/2011 Document Reviewed: 03/09/2009 [employer] Patient Information 2013 [place], [place].   Diabetes,  Frequently Asked Questions WHAT IS DIABETES? Most of the food we eat is turned into glucose (sugar). Our bodies use it for energy. The pancreas makes a hormone called insulin. It helps glucose get into the cells of our bodies. When you have diabetes, your body either does not make enough insulin or cannot use its own insulin as well as it should. This causes sugars to build up in your blood. WHAT ARE THE SYMPTOMS OF DIABETES?  Frequent urination.  Excessive thirst.  Unexplained weight loss.  Extreme hunger.  Blurred vision.  Tingling or numbness in hands or feet.  Feeling very tired much of the time.  Dry, itchy skin.  Sores that are slow to heal.  Yeast infections. WHAT ARE THE TYPES OF DIABETES? Type 1 Diabetes   About 10% of affected people have this type.  Usually occurs before the age of 91.  Usually occurs in thin to normal weight people. Type 2 Diabetes  About 90% of affected people have this type.  Usually occurs after the age of 51.  Usually occurs in overweight people.  More likely to have:  A family history of diabetes.  A history of diabetes during pregnancy (gestational diabetes).  High blood pressure.  High cholesterol and triglycerides. Gestational Diabetes  Occurs in about 4% of pregnancies.  Usually goes away after the baby is born.  More likely to occur in women with:  Family history of diabetes.  Previous gestational diabetes.  Obese.  Over 51 years old. WHAT IS PRE-DIABETES? Pre-diabetes means your blood glucose is higher than normal, but lower than the diabetes range. It also means you are at risk of getting type 2 diabetes and heart disease. If you are told you have pre-diabetes, have your blood glucose checked again in 1 to 2 years. WHAT IS THE TREATMENT FOR DIABETES? Treatment is aimed at keeping blood glucose near normal levels at all times. Learning how to manage this yourself is important in treating diabetes. Depending on  the type of diabetes you have, your treatment will include one or more of the following:  Monitoring your blood glucose.  Meal planning.  Exercise.  Oral medicine (pills) or insulin. CAN DIABETES BE PREVENTED? With type 1 diabetes, prevention is more difficult, because the triggers that cause it are not yet known. With type 2 diabetes, prevention is more likely, with lifestyle changes:  Maintain a healthy weight.  Eat healthy.  Exercise. IS THERE A CURE FOR DIABETES? No, there is no cure for diabetes. There is a lot of research going on that is looking for a cure, and progress is being made. Diabetes can be treated and controlled. People with diabetes can manage their diabetes and lead normal, active lives. SHOULD I BE TESTED FOR DIABETES? If you are at least 36 years old, you should be tested for diabetes. You should be tested again every 3 years. If you are 45 or older and overweight, you may want to get tested more often. If you are younger than 45, overweight, and have one or more of the following risk factors, you should be tested:  Family history of diabetes.  Inactive lifestyle.  High blood pressure. WHAT ARE SOME OTHER SOURCES FOR INFORMATION ON DIABETES? The following organizations may help in your search for more information on diabetes: National Diabetes Education Program (NDEP) Internet: SolarDiscussions.es American Diabetes Association Internet: http://www.diabetes.org  Juvenile Diabetes Foundation International Internet: WetlessWash.is Document Released: 08/04/2003 Document Revised: 10/24/2011 Document Reviewed: 05/29/2009 [facility] Patient Information 2013 [place], [place].   Diabetes and Standards of Medical Care  Diabetes is complicated. You may find that your diabetes team includes a dietitian, nurse, diabetes educator, eye doctor, and more. To help everyone know what is going on and to help you get the care you deserve, the following  schedule of care was developed to help keep you on track. Below are the tests, exams, vaccines, medicines, education, and plans you will need. A1c test  Performed at least 2 times a year if you are meeting treatment goals.  Performed 4 times a year if therapy has changed or if you are not meeting therapy/glycemic goals. Aspirin medicine  Take daily as directed by your caregiver. Blood pressure test  Performed at every routine medical visit. The goal is less than 130/80 mm/Hg. Dental exam  Get a dental exam at least 2 times a year. Dilated eye exam (retinal exam)  Type 1 diabetes: Get an exam within 5 years of diagnosis and then yearly.  Type 2 diabetes: Get an exam at diagnosis and then yearly. All exams thereafter can be extended to every 2 to 3 years if one or more exams have been normal. Foot care exam  Visual foot exams are performed at every routine medical visit. The exams check for cuts, injuries, or other problems with the feet.  A comprehensive foot exam should be done yearly. This  includes visual inspection as well as assessing foot pulses and testing for loss of sensation. Kidney function test (urine microalbumin)  Performed once a year.  Type 1 diabetes: The first test is performed 5 years after diagnosis.  Type 2 diabetes: The first test is performed at the time of diagnosis.  A serum creatinine and estimated glomerular filtration rate (eGFR) test is done once a year to tell the level of chronic kidney disease (CKD), if present. Lipid profile (Cholesterol, HDL, LDL, Triglycerides)  Performed once a year for most people. If at low risk, may be assessed every 2 years.  The goal for LDL is less than 100 mg/dl. If at high risk, the goal is less than 70 mg/dl.  The goal for HDL is higher than 40 mg/dl for men and higher than 50 mg/dl for women.  The goal for triglycerides is less than 150 mg/dl. Flu vaccine, pneumonia vaccine, and hepatitis B vaccine  The flu  vaccine is recommended yearly.  The pneumonia vaccine is generally given once in a lifetime. However, there are some instances where another vaccine is recommended. Check with your caregiver.  The hepatitis B vaccine is also recommended for adults with diabetes. Diabetes self-management education  Recommended at diagnosis and ongoing as needed. Treatment plan  Reviewed at every medical visit. Document Released: 05/29/2009 Document Revised: 10/24/2011 Document Reviewed: 02/01/2011 [facility] Patient Information 2013 [place], [place].  Diabetes and Exercise Regular exercise is important and can help:   Control blood glucose (sugar).  Decrease blood pressure.    Control blood lipids (cholesterol, triglycerides).  Improve overall health. BENEFITS FROM EXERCISE  Improved fitness.  Improved flexibility.  Improved endurance.  Increased bone density.  Weight control.  Increased muscle strength.  Decreased body fat.  Improvement of the body's use of insulin, a hormone.  Increased insulin sensitivity.  Reduction of insulin needs.  Reduced stress and tension.  Helps you feel better. People with diabetes who add exercise to their lifestyle gain additional benefits, including:  Weight loss.  Reduced appetite.  Improvement of the body's use of blood glucose.  Decreased risk factors for heart disease:  Lowering of cholesterol and triglycerides.  Raising the level of good cholesterol (high-density lipoproteins, HDL).  Lowering blood sugar.  Decreased blood pressure. TYPE 1 DIABETES AND EXERCISE  Exercise will usually lower your blood glucose.  If blood glucose is greater than 240 mg/dl, check urine ketones. If ketones are present, do not exercise.  Location of the insulin injection sites may need to be adjusted with exercise. Avoid injecting insulin into areas of the body that will be exercised. For example, avoid injecting insulin into:  The arms when playing  tennis.  The legs when jogging. For more information, discuss this with your caregiver.  Keep a record of:  Food intake.  Type and amount of exercise.  Expected peak times of insulin action.  Blood glucose levels. Do this before, during, and after exercise. Review your records with your caregiver. This will help you to develop guidelines for adjusting food intake and insulin amounts.  TYPE 2 DIABETES AND EXERCISE  Regular physical activity can help control blood glucose.  Exercise is important because it may:  Increase the body's sensitivity to insulin.  Improve blood glucose control.  Exercise reduces the risk of heart disease. It decreases serum cholesterol and triglycerides. It also lowers blood pressure.  Those who take insulin or oral hypoglycemic agents should watch for signs of hypoglycemia. These signs include dizziness, shaking, sweating, chills, and  confusion.  Body water is lost during exercise. It must be replaced. This will help to avoid loss of body fluids (dehydration) or heat stroke. Be sure to talk to your caregiver before starting an exercise program to make sure it is safe for you. Remember, any activity is better than none.  Document Released: 10/22/2003 Document Revised: 10/24/2011 Document Reviewed: 02/05/2009 [facility] Patient Information 2013 [place], [place].

## 2012-06-06 NOTE — Progress Notes (Signed)
Subjective:    Patient ID: Robin Kennedy, female    DOB: [birth year], 36 y.o.   MRN: [id number]  HPI  Recently diagnosed with diabetes by Dr. Aldona Bar [facility] - has 2 kids in college and a 62 yo.  Told her she might have mild DM and need to establish a PCP.  Using one touch meter.  Not on any meds  Review of Systems  Constitutional: Positive for activity change and fatigue. Negative for fever, chills, diaphoresis, appetite change and unexpected weight change.  Eyes: Negative for visual disturbance.  Respiratory: Negative for cough and shortness of breath.   Cardiovascular: Negative for chest pain, palpitations and leg swelling.  Genitourinary: Negative for decreased urine volume.  Neurological: Negative for syncope and headaches.  Hematological: Does not bruise/bleed easily.      BP 124/82  Pulse 92  Temp 98 F (36.7 C) (Oral)  Resp 16  Ht 5' 7.25" (1.708 m)  Wt 209 lb 9.6 oz (95.074 kg)  BMI 32.58 kg/m2  SpO2 100%  LMP 05/30/2012 Objective:   Physical Exam  Constitutional: She is oriented to person, place, and time. She appears well-developed and well-nourished. No distress.  HENT:  Head: Normocephalic and atraumatic.  Right Ear: External ear normal.  Left Ear: External ear normal.  Eyes: Conjunctivae normal are normal. No scleral icterus.  Neck: Normal range of motion. Neck supple. No thyromegaly present.  Cardiovascular: Normal rate, regular rhythm, normal heart sounds and intact distal pulses.   Pulmonary/Chest: Effort normal and breath sounds normal. No respiratory distress.  Musculoskeletal: She exhibits no edema.  Lymphadenopathy:    She has no cervical adenopathy.  Neurological: She is alert and oriented to person, place, and time.  Skin: Skin is warm and dry. She is not diaphoretic. No erythema.  Psychiatric: She has a normal mood and affect. Her behavior is normal.          Results for orders placed in visit on 06/06/12  COMPREHENSIVE  METABOLIC PANEL      Component Value Range   Sodium 137  135 - 145 mEq/L   Potassium 4.1  3.5 - 5.3 mEq/L   Chloride 105  96 - 112 mEq/L   CO2 21  19 - 32 mEq/L   Glucose, Bld 220 (*) 70 - 99 mg/dL   BUN 13  6 - 23 mg/dL   Creat 4.09  8.11 - 9.14 mg/dL   Total Bilirubin 0.4  0.3 - 1.2 mg/dL   Alkaline Phosphatase 35 (*) 39 - 117 U/L   AST 11  0 - 37 U/L   ALT 9  0 - 35 U/L   Total Protein 6.5  6.0 - 8.3 g/dL   Albumin 4.4  3.5 - 5.2 g/dL   Calcium 9.2  8.4 - 78.2 mg/dL  POCT GLYCOSYLATED HEMOGLOBIN (HGB A1C)      Component Value Range   Hemoglobin A1C 9.2    MICROALBUMIN, URINE      Component Value Range   Microalb, Ur 0.87  0.00 - 1.89 mg/dL  C-PEPTIDE      Component Value Range   C-Peptide 3.89  0.80 - 3.90 ng/mL  INSULIN, RANDOM      Component Value Range   Insulin 59 (*) 3 - 28 uIU/mL  GLUTAMIC ACID DECARBOXYLASE AUTO ABS      Component Value Range   Glutamic Acid Decarb Ab <1.0  <=1.0 U/mL  ANTI-ISLET CELL ANTIBODY      Component Value Range  Pancreatic Islet Cell Antibody <5  < 5 JDF Units    Assessment & Plan:   1. Diabetes mellitus - check labs to see if pt is type 1 vs 2 - a1c 9.2 and pt is mildly obese and fairly young to have such advanced type 2 DM. Start asa 81mg  qd.  Recheck in 1-2 wks once labs are back. Start metformin 1000 bid and keep a close cbg log to see if we need to advance meds at f/u OV. POCT glycosylated hemoglobin (Hb A1C), Microalbumin, urine, C-peptide, Insulin, random, metFORMIN (GLUCOPHAGE) 1000 MG tablet, Ambulatory referral to diabetic education, Glutamic acid decarboxylase auto abs, Anti-islet cell antibody  2. Encounter for long-term (current) use of other medications  Comprehensive metabolic panel   Meds ordered this encounter  Medications  . metFORMIN (GLUCOPHAGE) 1000 MG tablet    Sig: Take 1 tablet (1,000 mg total) by mouth 2 (two) times daily with a meal.    Dispense:  180 tablet    Refill:  3

## 2012-06-07 LAB — MICROALBUMIN, URINE: Microalb, Ur: 0.87 mg/dL (ref 0.00–1.89)

## 2012-06-07 LAB — INSULIN, RANDOM: Insulin: 59 u[IU]/mL — ABNORMAL HIGH (ref 3–28)

## 2012-06-07 LAB — C-PEPTIDE: C-Peptide: 3.89 ng/mL (ref 0.80–3.90)

## 2012-06-08 LAB — COMPREHENSIVE METABOLIC PANEL
ALT: 9 U/L (ref 0–35)
AST: 11 U/L (ref 0–37)
Albumin: 4.4 g/dL (ref 3.5–5.2)
Alkaline Phosphatase: 35 U/L — ABNORMAL LOW (ref 39–117)
BUN: 13 mg/dL (ref 6–23)
CO2: 21 mEq/L (ref 19–32)
Calcium: 9.2 mg/dL (ref 8.4–10.5)
Chloride: 105 mEq/L (ref 96–112)
Creat: 0.66 mg/dL (ref 0.50–1.10)
Glucose, Bld: 220 mg/dL — ABNORMAL HIGH (ref 70–99)
Potassium: 4.1 mEq/L (ref 3.5–5.3)
Sodium: 137 mEq/L (ref 135–145)
Total Bilirubin: 0.4 mg/dL (ref 0.3–1.2)
Total Protein: 6.5 g/dL (ref 6.0–8.3)

## 2012-06-11 LAB — GLUTAMIC ACID DECARBOXYLASE AUTO ABS: Glutamic Acid Decarb Ab: <1 U/mL (ref <=1.0)

## 2012-06-12 LAB — ANTI-ISLET CELL ANTIBODY: Pancreatic Islet Cell Antibody: <5 JDF Units (ref <5)

## 2012-06-15 ENCOUNTER — Ambulatory Visit (INDEPENDENT_AMBULATORY_CARE_PROVIDER_SITE_OTHER): Payer: BC Managed Care – PPO | Admitting: Family Medicine

## 2012-06-15 VITALS — BP 118/74 | HR 80 | Temp 99.4°F | Resp 16 | Ht 68.0 in | Wt 207.2 lb

## 2012-06-15 DIAGNOSIS — E119 Type 2 diabetes mellitus without complications: Secondary | ICD-10-CM

## 2012-06-19 DIAGNOSIS — E119 Type 2 diabetes mellitus without complications: Secondary | ICD-10-CM | POA: Insufficient documentation

## 2012-06-19 NOTE — Progress Notes (Signed)
Subjective:    Patient ID: Robin Kennedy, female    DOB: [birth year], 36 y.o.   MRN: [id number]  HPI  Has been doing fine on increased metfomrin dose - tolerating w/o difficulty. Has seen huge improvement in cbgs.  Has appt w/ nutrition set up.  Noticing sig changes in cbgs depending on diet choices so getting good feedback from this.  Starting to exercise - joined [employer] w/ daughter.  We did testing at her last visit to differentiate type II from adult onset type I as pt is quite young and just borderline overweight to obese but all returned confirming type II.  Past Medical History  Diagnosis Date  . Diabetes mellitus    Review of Systems  Constitutional: Negative for fever, chills, diaphoresis, appetite change, fatigue and unexpected weight change.  Eyes: Negative for visual disturbance.  Respiratory: Negative for cough and shortness of breath.   Cardiovascular: Negative for chest pain, palpitations and leg swelling.  Genitourinary: Negative for urgency and frequency.  Neurological: Negative for syncope and headaches.      BP 118/74  Pulse 80  Temp 99.4 F (37.4 C) (Oral)  Resp 16  Ht 5\' 8"  (1.727 m)  Wt 207 lb 3.2 oz (93.985 kg)  BMI 31.50 kg/m2  SpO2 98%  LMP 05/30/2012 Objective:   Physical Exam  Constitutional: She is oriented to person, place, and time. She appears well-developed and well-nourished. No distress.  HENT:  Head: Normocephalic and atraumatic.  Right Ear: External ear normal.  Left Ear: External ear normal.  Eyes: Conjunctivae normal are normal. No scleral icterus.  Neck: Normal range of motion. Neck supple. No thyromegaly present.  Cardiovascular: Normal rate, regular rhythm, normal heart sounds and intact distal pulses.   Pulmonary/Chest: Effort normal and breath sounds normal. No respiratory distress.  Musculoskeletal: She exhibits no edema.  Lymphadenopathy:    She has no cervical adenopathy.  Neurological: She is alert and oriented to  person, place, and time.  Skin: Skin is warm and dry. She is not diaphoretic. No erythema.  Psychiatric: She has a normal mood and affect. Her behavior is normal.          Results for orders placed in visit on 06/06/12  COMPREHENSIVE METABOLIC PANEL      Component Value Range   Sodium 137  135 - 145 mEq/L   Potassium 4.1  3.5 - 5.3 mEq/L   Chloride 105  96 - 112 mEq/L   CO2 21  19 - 32 mEq/L   Glucose, Bld 220 (*) 70 - 99 mg/dL   BUN 13  6 - 23 mg/dL   Creat 4.69  6.29 - 5.28 mg/dL   Total Bilirubin 0.4  0.3 - 1.2 mg/dL   Alkaline Phosphatase 35 (*) 39 - 117 U/L   AST 11  0 - 37 U/L   ALT 9  0 - 35 U/L   Total Protein 6.5  6.0 - 8.3 g/dL   Albumin 4.4  3.5 - 5.2 g/dL   Calcium 9.2  8.4 - 41.3 mg/dL  POCT GLYCOSYLATED HEMOGLOBIN (HGB A1C)      Component Value Range   Hemoglobin A1C 9.2    MICROALBUMIN, URINE      Component Value Range   Microalb, Ur 0.87  0.00 - 1.89 mg/dL  C-PEPTIDE      Component Value Range   C-Peptide 3.89  0.80 - 3.90 ng/mL  INSULIN, RANDOM      Component Value Range   Insulin 59 (*)  3 - 28 uIU/mL  GLUTAMIC ACID DECARBOXYLASE AUTO ABS      Component Value Range   Glutamic Acid Decarb Ab <1.0  <=1.0 U/mL  ANTI-ISLET CELL ANTIBODY      Component Value Range   Pancreatic Islet Cell Antibody <5  < 5 JDF Units    Assessment & Plan:  DM II - Cont metformin 1000 bid. Pt has had sig improvement in cbgs and plans to cont to focus on diet and exercise, will meet w/ DM ed/nutrition. Recheck at f/u in sev mos and if a1c not at goal will have to step up therapy.

## 2012-07-18 ENCOUNTER — Encounter: Payer: BC Managed Care – PPO | Attending: Obstetrics & Gynecology | Admitting: Dietician

## 2012-07-18 ENCOUNTER — Encounter: Payer: Self-pay | Admitting: Dietician

## 2012-07-18 VITALS — Ht 68.0 in | Wt 208.9 lb

## 2012-07-18 DIAGNOSIS — E119 Type 2 diabetes mellitus without complications: Secondary | ICD-10-CM | POA: Insufficient documentation

## 2012-07-18 DIAGNOSIS — Z713 Dietary counseling and surveillance: Secondary | ICD-10-CM | POA: Insufficient documentation

## 2012-07-18 NOTE — Progress Notes (Signed)
  Medical Nutrition Therapy:  Appt start time: 1600 end time:  1630.  Assessment:  Primary concerns today: Comes today concerned that she is not losing weight even though her blood glucose levels have improved.  Weight today is at 208.9 lb with a gain of about 0.5 lb.  It is as if she has hit a plateau.  Request a review of carb counting and label reading. Notes she needs to get back on track.  HYPERGLYCEMIA: Gives no S/S of hight blood glucose  HYPOGLYCEMIA:  Gives no S/S of low blood glucose  BLOOD GLUCOSE MONITORING:  Fasting: 150-160      AC Lunch: 100-120      PC Lunch: 150's  -MEDICATIONS: Has started Metformins since her last visit.  DIETARY INTAKE:  Spent our time reviewing carb counting and label reading and more healthy food choices.  Emphasized the need to monitor portions and measure and weigh her foods periodically.   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  Works a part-time job at [employer] at [employer] on Wednesday's.  On those days, she is on her feet more and moving about the baking area.  Estimated energy needs: HT: 68 in  WT: 208.9 lb  BMI: 31.8 kg/m2  AdJ WT:162 lb (73 kg) 1400 calories 160-165 g carbohydrates 105-110 g protein 38-40 g fat  Progress Towards Goal(s):  In progress.   Nutritional Diagnosis:  [place]-2.1 Inpaired nutrition utilization As related to blood glucose.  As evidenced by diagnosis of type 2 diabetes with slowing of weight loss with decreased activity, and lowering of blood glucose with the use of Metformin.    Intervention:  Nutrition Review of the need to keep her carb levels tight and to have some daily exercise.  Needs to aim for 20+ minutes for 5 days per week.  Handouts given during visit include:  Novo Nordisk Carb Counting booklet  Monitoring/Evaluation:  Dietary intake, exercise, blood glucose levesl, and body weight in 3 months and to call or e-mail  with questions or concerns in the meantime.

## 2012-09-21 ENCOUNTER — Ambulatory Visit (INDEPENDENT_AMBULATORY_CARE_PROVIDER_SITE_OTHER): Payer: BC Managed Care – PPO | Admitting: Family Medicine

## 2012-09-21 ENCOUNTER — Encounter: Payer: Self-pay | Admitting: Family Medicine

## 2012-09-21 VITALS — BP 119/69 | HR 88 | Temp 97.8°F | Resp 16 | Ht 67.0 in | Wt 203.0 lb

## 2012-09-21 DIAGNOSIS — E7800 Pure hypercholesterolemia, unspecified: Secondary | ICD-10-CM

## 2012-09-21 DIAGNOSIS — E785 Hyperlipidemia, unspecified: Secondary | ICD-10-CM

## 2012-09-21 DIAGNOSIS — Z23 Encounter for immunization: Secondary | ICD-10-CM

## 2012-09-21 DIAGNOSIS — E119 Type 2 diabetes mellitus without complications: Secondary | ICD-10-CM

## 2012-09-21 LAB — LIPID PANEL
Cholesterol: 151 mg/dL (ref 0–200)
HDL: 44 mg/dL (ref >39)
LDL Cholesterol: 95 mg/dL (ref 0–99)
Total CHOL/HDL Ratio: 3.4 Ratio
Triglycerides: 58 mg/dL (ref <150)
VLDL: 12 mg/dL (ref 0–40)

## 2012-09-21 LAB — POCT GLYCOSYLATED HEMOGLOBIN (HGB A1C): Hemoglobin A1C: 7.2

## 2012-09-21 MED ORDER — GLUCOSE BLOOD VI STRP: ORAL_STRIP | Status: AC

## 2012-09-21 NOTE — Progress Notes (Signed)
Subjective:    Patient ID: Robin Kennedy, female    DOB: 1976/02/03, 37 y.o.   MRN: [id number] Chief Complaint  Patient presents with  . Diabetes    follow up metformin    HPI  Robin Kennedy is doing great.  She has made some drastic dietary changes since her last OV when she was initially diagnosed w/ DM. Has lost some weight. Is still working out.  Is really following diabetic diet and refers to the handout I have given her every few wks to ensure she is still on track. Cut down her carb intake a lot and when she does eat carbs - eats less and trying to stick with whole grain.  Initially, she was checking her sugars regularly and was able to learn what foods spike them and what her symptoms were of even mildly elevated sugars - just fills a bit more sluggish so now she is just checking them about once a wk.  She was also able to eleminate most of the foods that really spiked her cbgs.  cbg 123 after eating last wk.  Past Medical History  Diagnosis Date  . Diabetes mellitus    Current Outpatient Prescriptions on File Prior to Visit  Medication Sig Dispense Refill  . metFORMIN (GLUCOPHAGE) 1000 MG tablet Take 1 tablet (1,000 mg total) by mouth 2 (two) times daily with a meal.  180 tablet  3   No current facility-administered medications on file prior to visit.   No Known Allergies  Review of Systems  Constitutional: Positive for activity change and appetite change. Negative for fever, chills, diaphoresis, fatigue and unexpected weight change.  Eyes: Negative for visual disturbance.  Respiratory: Negative for cough and shortness of breath.   Cardiovascular: Negative for chest pain, palpitations and leg swelling.  Genitourinary: Negative for decreased urine volume.  Skin: Negative for rash.  Neurological: Negative for syncope, weakness, numbness and headaches.  Hematological: Negative for adenopathy. Does not bruise/bleed easily.      BP 119/69  Pulse 88  Temp(Src) 97.8 F (36.6 C)  (Oral)  Resp 16  Ht 5\' 7"  (1.702 m)  Wt 203 lb (92.08 kg)  BMI 31.79 kg/m2  LMP 09/21/2012 Objective:   Physical Exam  Constitutional: She is oriented to person, place, and time. She appears well-developed and well-nourished. No distress.  HENT:  Head: Normocephalic and atraumatic.  Right Ear: External ear normal.  Left Ear: External ear normal.  Eyes: Conjunctivae are normal. No scleral icterus.  Neck: Normal range of motion. Neck supple. No thyromegaly present.  Cardiovascular: Normal rate, regular rhythm, normal heart sounds and intact distal pulses.   Pulmonary/Chest: Effort normal and breath sounds normal. No respiratory distress.  Musculoskeletal: She exhibits no edema.  Lymphadenopathy:    She has no cervical adenopathy.  Neurological: She is alert and oriented to person, place, and time.  Skin: Skin is warm and dry. She is not diaphoretic. No erythema.  Psychiatric: She has a normal mood and affect. Her behavior is normal.      Results for orders placed in visit on 09/21/12  POCT GLYCOSYLATED HEMOGLOBIN (HGB A1C)      Component Value Range   Hemoglobin A1C 7.2      Assessment & Plan:   1. Need for prophylactic vaccination with combined diphtheria-tetanus-pertussis (DTP) vaccine  Tdap vaccine greater than or equal to 7yo IM   Tdap vaccine greater than or equal to 7yo IM   POCT glycosylated hemoglobin (Hb A1C)   Lipid panel  2. Type II or unspecified type diabetes mellitus without mention of complication, not stated as uncontrolled  POCT glycosylated hemoglobin (Hb A1C)   POCT glycosylated hemoglobin (Hb A1C)   Lipid panel   glucose blood (ONE TOUCH ULTRA TEST) test strip  3. Pure hypercholesterolemia  POCT glycosylated hemoglobin (Hb A1C)   POCT glycosylated hemoglobin (Hb A1C)   Lipid panel  4. Diabetes mellitus    Hgba1c close to goal and dramatically improved of >2 hgba1c decrease!!  Pt doing a great job so continue diet and exercise and weight loss so that we  can try to get a1c <6.5.  Cont to check on feet and take asa 81mg  po qd. Meds ordered this encounter  Medications  . DISCONTD: ONE TOUCH ULTRA TEST test strip    Sig:   . glucose blood (ONE TOUCH ULTRA TEST) test strip    Sig: Use to check cbgs qd    Dispense:  100 each    Refill:  11

## 2012-09-21 NOTE — Patient Instructions (Addendum)
Diabetes and Standards of Medical Care  Diabetes is complicated. You may find that your diabetes team includes a dietitian, nurse, diabetes educator, eye doctor, and more. To help everyone know what is going on and to help you get the care you deserve, the following schedule of care was developed to help keep you on track. Below are the tests, exams, vaccines, medicines, education, and plans you will need. A1c test  Performed at least 2 times a year if you are meeting treatment goals.  Performed 4 times a year if therapy has changed or if you are not meeting therapy/glycemic goals. Aspirin medicine  Take daily as directed by your caregiver. Blood pressure test  Performed at every routine medical visit. The goal is less than 130/80 mm/Hg. Dental exam  Get a dental exam at least 2 times a year. Dilated eye exam (retinal exam)  Type 1 diabetes: Get an exam within 5 years of diagnosis and then yearly.  Type 2 diabetes: Get an exam at diagnosis and then yearly. All exams thereafter can be extended to every 2 to 3 years if one or more exams have been normal. Foot care exam  Visual foot exams are performed at every routine medical visit. The exams check for cuts, injuries, or other problems with the feet.  A comprehensive foot exam should be done yearly. This includes visual inspection as well as assessing foot pulses and testing for loss of sensation. Kidney function test (urine microalbumin)  Performed once a year.  Type 1 diabetes: The first test is performed 5 years after diagnosis.  Type 2 diabetes: The first test is performed at the time of diagnosis.  A serum creatinine and estimated glomerular filtration rate (eGFR) test is done once a year to tell the level of chronic kidney disease (CKD), if present. Lipid profile (Cholesterol, HDL, LDL, Triglycerides)  Performed once a year for most people. If at low risk, may be assessed every 2 years.  The goal for LDL is less than 100  mg/dl. If at high risk, the goal is less than 70 mg/dl.  The goal for HDL is higher than 40 mg/dl for men and higher than 50 mg/dl for women.  The goal for triglycerides is less than 150 mg/dl. Flu vaccine, pneumonia vaccine, and hepatitis B vaccine  The flu vaccine is recommended yearly.  The pneumonia vaccine is generally given once in a lifetime. However, there are some instances where another vaccine is recommended. Check with your caregiver.  The hepatitis B vaccine is also recommended for adults with diabetes. Diabetes self-management education  Recommended at diagnosis and ongoing as needed. Treatment plan  Reviewed at every medical visit. Document Released: 05/29/2009 Document Revised: 10/24/2011 Document Reviewed: 02/01/2011 [facility] Patient Information 2013 [place], [place].   Diabetes Meal Planning Guide The diabetes meal planning guide is a tool to help you plan your meals and snacks. It is important for people with diabetes to manage their blood glucose (sugar) levels. Choosing the right foods and the right amounts throughout your day will help control your blood glucose. Eating right can even help you improve your blood pressure and reach or maintain a healthy weight. CARBOHYDRATE COUNTING MADE EASY When you eat carbohydrates, they turn to sugar. This raises your blood glucose level. Counting carbohydrates can help you control this level so you feel better. When you plan your meals by counting carbohydrates, you can have more flexibility in what you eat and balance your medicine with your food intake. Carbohydrate counting simply  means adding up the total amount of carbohydrate grams in your meals and snacks. Try to eat about the same amount at each meal. Foods with carbohydrates are listed below. Each portion below is 1 carbohydrate serving or 15 grams of carbohydrates. Ask your dietician how many grams of carbohydrates you should eat at each meal or snack. Grains and  Starches  1 slice bread.   English muffin or hotdog/hamburger bun.   cup cold cereal (unsweetened).   cup cooked pasta or rice.   cup starchy vegetables (corn, potatoes, peas, beans, winter squash).  1 tortilla (6 inches).   bagel.  1 waffle or pancake (size of a CD).   cup cooked cereal.  4 to 6 small crackers. *Whole grain is recommended. Fruit  1 cup fresh unsweetened berries, melon, papaya, pineapple.  1 small fresh fruit.   banana or mango.   cup fruit juice (4 oz unsweetened).   cup canned fruit in natural juice or water.  2 tbs dried fruit.  12 to 15 grapes or cherries. Milk and Yogurt  1 cup fat-free or 1% milk.  1 cup soy milk.  6 oz light yogurt with sugar-free sweetener.  6 oz low-fat soy yogurt.  6 oz plain yogurt. Vegetables  1 cup raw or  cup cooked is counted as 0 carbohydrates or a "free" food.  If you eat 3 or more servings at 1 meal, count them as 1 carbohydrate serving. Other Carbohydrates   oz chips or pretzels.   cup ice cream or frozen yogurt.   cup sherbet or sorbet.  2 inch square cake, no frosting.  1 tbs honey, sugar, jam, jelly, or syrup.  2 small cookies.  3 squares of graham crackers.  3 cups popcorn.  6 crackers.  1 cup broth-based soup.  Count 1 cup casserole or other mixed foods as 2 carbohydrate servings.  Foods with less than 20 calories in a serving may be counted as 0 carbohydrates or a "free" food. You may want to purchase a book or computer software that lists the carbohydrate gram counts of different foods. In addition, the nutrition facts panel on the labels of the foods you eat are a good source of this information. The label will tell you how big the serving size is and the total number of carbohydrate grams you will be eating per serving. Divide this number by 15 to obtain the number of carbohydrate servings in a portion. Remember, 1 carbohydrate serving equals 15 grams of  carbohydrate. SERVING SIZES Measuring foods and serving sizes helps you make sure you are getting the right amount of food. The list below tells how big or small some common serving sizes are.  1 oz.........4 stacked dice.  3 oz........[place] KitchenDeck of cards.  1 tsp.......[place] KitchenTip of little finger.  1 tbs......Marland KitchenMarland KitchenThumb.  2 tbs.......Marland KitchenGolf ball.   cup......Marland KitchenHalf of a fist.  1 cup.......Marland KitchenA fist. SAMPLE DIABETES MEAL PLAN Below is a sample meal plan that includes foods from the grain and starches, dairy, vegetable, fruit, and meat groups. A dietician can individualize a meal plan to fit your calorie needs and tell you the number of servings needed from each food group. However, controlling the total amount of carbohydrates in your meal or snack is more important than making sure you include all of the food groups at every meal. You may interchange carbohydrate containing foods (dairy, starches, and fruits). The meal plan below is an example of a 2000 calorie diet using carbohydrate counting. This meal plan has  17 carbohydrate servings. Breakfast  1 cup oatmeal (2 carb servings).   cup light yogurt (1 carb serving).  1 cup blueberries (1 carb serving).   cup almonds. Snack  1 large apple (2 carb servings).  1 low-fat string cheese stick. Lunch  Chicken breast salad.  1 cup spinach.   cup chopped tomatoes.  2 oz chicken breast, sliced.  2 tbs low-fat [place] dressing.  12 whole-wheat crackers (2 carb servings).  12 to 15 grapes (1 carb serving).  1 cup low-fat milk (1 carb serving). Snack  1 cup carrots.   cup hummus (1 carb serving). Dinner  3 oz broiled salmon.  1 cup brown rice (3 carb servings). Snack  1  cups steamed broccoli (1 carb serving) drizzled with 1 tsp olive oil and lemon juice.  1 cup light pudding (2 carb servings). DIABETES MEAL PLANNING WORKSHEET Your dietician can use this worksheet to help you decide how many servings of foods and what types  of foods are right for you.  BREAKFAST Food Group and Servings / Carb Servings Grain/Starches __________________________________ Dairy __________________________________________ Vegetable ______________________________________ Fruit ___________________________________________ Meat __________________________________________ Fat ____________________________________________ LUNCH Food Group and Servings / Carb Servings Grain/Starches ___________________________________ Dairy ___________________________________________ Fruit ____________________________________________ Meat ___________________________________________ Fat _____________________________________________ Laural Golden Food Group and Servings / Carb Servings Grain/Starches ___________________________________ Dairy ___________________________________________ Fruit ____________________________________________ Meat ___________________________________________ Fat _____________________________________________ SNACKS Food Group and Servings / Carb Servings Grain/Starches ___________________________________ Dairy ___________________________________________ Vegetable _______________________________________ Fruit ____________________________________________ Meat ___________________________________________ Fat _____________________________________________ DAILY TOTALS Starches _________________________ Vegetable ________________________ Fruit ____________________________ Dairy ____________________________ Meat ____________________________ Fat ______________________________ Document Released: 04/28/2005 Document Revised: 10/24/2011 Document Reviewed: 03/09/2009 [employer] Patient Information 2013 [place], [place].   Diabetes and Exercise Regular exercise is important and can help:   Control blood glucose (sugar).  Decrease blood pressure.    Control blood lipids (cholesterol, triglycerides).  Improve overall health. BENEFITS  FROM EXERCISE  Improved fitness.  Improved flexibility.  Improved endurance.  Increased bone density.  Weight control.  Increased muscle strength.  Decreased body fat.  Improvement of the body's use of insulin, a hormone.  Increased insulin sensitivity.  Reduction of insulin needs.  Reduced stress and tension.  Helps you feel better. People with diabetes who add exercise to their lifestyle gain additional benefits, including:  Weight loss.  Reduced appetite.  Improvement of the body's use of blood glucose.  Decreased risk factors for heart disease:  Lowering of cholesterol and triglycerides.  Raising the level of good cholesterol (high-density lipoproteins, HDL).  Lowering blood sugar.  Decreased blood pressure. TYPE 1 DIABETES AND EXERCISE  Exercise will usually lower your blood glucose.  If blood glucose is greater than 240 mg/dl, check urine ketones. If ketones are present, do not exercise.  Location of the insulin injection sites may need to be adjusted with exercise. Avoid injecting insulin into areas of the body that will be exercised. For example, avoid injecting insulin into:  The arms when playing tennis.  The legs when jogging. For more information, discuss this with your caregiver.  Keep a record of:  Food intake.  Type and amount of exercise.  Expected peak times of insulin action.  Blood glucose levels. Do this before, during, and after exercise. Review your records with your caregiver. This will help you to develop guidelines for adjusting food intake and insulin amounts.  TYPE 2 DIABETES AND EXERCISE  Regular physical activity can help control blood glucose.  Exercise is important because it may:  Increase the body's sensitivity to insulin.  Improve blood glucose control.  Exercise reduces the risk of heart disease. It decreases serum cholesterol and triglycerides. It also lowers blood pressure.  Those who take insulin or oral  hypoglycemic agents should watch for signs of hypoglycemia. These signs include dizziness, shaking, sweating, chills, and confusion.  Body water is lost during exercise. It must be replaced. This will help to avoid loss of body fluids (dehydration) or heat stroke. Be sure to talk to your caregiver before starting an exercise program to make sure it is safe for you. Remember, any activity is better than none.  Document Released: 10/22/2003 Document Revised: 10/24/2011 Document Reviewed: 02/05/2009 [facility] Patient Information 2013 [place], [place].   Diabetes, Eating Away From Home Sometimes, you might eat in a restaurant or have meals that are prepared by someone else. You can enjoy eating out. However, the portions in restaurants may be much larger than needed. Listed below are some ideas to help you choose foods that will keep your blood glucose (sugar) in better control.  TIPS FOR EATING OUT  Know your meal plan and how many carbohydrate servings you should have at each meal. You may wish to carry a copy of your meal plan in your purse or wallet. Learn the foods included in each food group.  Make a list of restaurants near you that offer healthy choices. Take a copy of the carry-out menus to see what they offer. Then, you can plan what you will order ahead of time.  Become familiar with serving sizes by practicing them at home using measuring cups and spoons. Once you learn to recognize portion sizes, you will be able to correctly estimate the amount of total carbohydrate you are allowed to eat at the restaurant. Ask for a takeout box if the portion is more than you should have. When your food comes, leave the amount you should have on the plate, and put the rest in the takeout box before you start eating.  Plan ahead if your mealtime will be different from usual. Check with your caregiver to find out how to time meals and medicine if you are taking insulin.  Avoid high-fat foods, such as fried  foods, cream sauces, high-fat salad dressings, or any added butter or margarine.  Do not be afraid to ask questions. Ask your server about the portion size, cooking methods, ingredients and if items can be substituted. Restaurants do not list all available items on the menu. You can ask for your main entree to be prepared using skim milk, oil instead of butter or margarine, and without gravy or sauces. Ask your waiter or waitress to serve salad dressings, gravy, sauces, margarine, and sour cream on the side. You can then add the amount your meal plan suggests.  Add more vegetables whenever possible.  Avoid items that are labeled "jumbo," "giant," "deluxe," or "supersized."  You may want to split an entre with someone and order an extra side salad.  Watch for hidden calories in foods like croutons, bacon, or cheese.  Ask your server to take away the bread basket or chips from your table.  Order a dinner salad as an appetizer. You can eat most foods served in a restaurant. Some foods are better choices than others. Breads and Starches  Recommended: All kinds of bread (wheat, rye, white, oatmeal, [place], [place], raisin), hard or soft dinner rolls, frankfurter or hamburger buns, small bagels, small corn or whole-wheat flour tortillas.  Avoid: Frosted or glazed breads, butter rolls, egg or cheese breads, croissants, sweet rolls, pastries, coffee  cake, glazed or frosted doughnuts, muffins. Crackers  Recommended: Animal crackers, graham, rye, saltine, oyster, and matzoth crackers. Bread sticks, melba toast, rusks, pretzels, popcorn (without fat), zwieback toast.  Avoid: High-fat snack crackers or chips. Buttered popcorn. Cereals  Recommended: Hot and cold cereals. Whole grains such as oatmeal or shredded wheat are good choices.  Avoid: Sugar-coated or granola type cereals. Potatoes/Pasta/Rice/Beans  Recommended: Order baked, boiled, or mashed potatoes, rice or noodles without added fat,  whole beans. Order gravies, butter, margarine, or sauces on the side so you can control the amount you add.  Avoid: Hash browns or fried potatoes. Potatoes, pasta, or rice prepared with cream or cheese sauce. Potato or pasta salads prepared with large amounts of dressing. Fried beans or fried rice. Vegetables  Recommended: Order steamed, baked, boiled, or stewed vegetables without sauces or extra fat. Ask that sauce be served on the side. If vegetables are not listed on the menu, ask what is available.  Avoid: Vegetables prepared with cream, butter, or cheese sauce. Fried vegetables. Salad Bars  Recommended: Many of the vegetables at a salad bar are considered "free." Use lemon juice, vinegar, or low-calorie salad dressing (fewer than 20 calories per serving) as "free" dressings for your salad. Look for salad bar ingredients that have no added fat or sugar such as tomatoes, lettuce, cucumbers, broccoli, carrots, onions, and mushrooms.  Avoid: Prepared salads with large amounts of dressing, such as coleslaw, caesar salad, macaroni salad, bean salad, or carrot salad. Fruit  Recommended: Eat fresh fruit or fresh fruit salad without added dressing. A salad bar often offers fresh fruit choices, but canned fruit at a restaurant is usually packed in sugar or syrup.  Avoid: Sweetened canned or frozen fruits, plain or sweetened fruit juice. Fruit salads with dressing, sour cream, or sugar added to them. Meat and Meat Substitutes  Recommended: Order broiled, baked, roasted, or grilled meat, poultry, or fish. Trim off all visible fat. Do not eat the skin of poultry. The size stated on the menu is the raw weight. Meat shrinks by  in cooking (for example, 4 oz raw equals 3 oz cooked meat).  Avoid: Deep-fat fried meat, poultry, or fish. Breaded meats. Eggs  Recommended: Order soft, hard-cooked, poached, or scrambled eggs. Omelets may be okay, depending on what ingredients are added. Egg substitutes are  also a good choice.  Avoid: Fried eggs, eggs prepared with cream or cheese sauce. Milk  Recommended: Order low-fat or fat-free milk according to your meal plan. Plain, nonfat yogurt or flavored yogurt with no sugar added may be used as a substitute for milk. Soy milk may also be used.  Avoid: Milk shakes or sweetened milk beverages. Soups and Combination Foods  Recommended: Clear broth or consomm are "free" foods and may be used as an appetizer. Broth-based soups with fat removed count as a starch serving and are preferred over cream soups. Soups made with beans or split peas may be eaten but count as a starch.  Avoid: Fatty soups, soup made with cream, cheese soup. Combination foods prepared with excessive amounts of fat or with cream or cheese sauces. Desserts and Sweets  Recommended: Ask for fresh fruit. Sponge or angel food cake without icing, ice milk, no sugar added ice cream, sherbet, or frozen yogurt may fit into your meal plan occasionally.  Avoid: Pastries, puddings, pies, cakes with icing, custard, gelatin desserts. Fats and Oils  Recommended: Choose healthy fats such as olive oil, canola oil, or tub margarine, reduced fat or  fat-free sour cream, cream cheese, avocado, or nuts.  Avoid: Any fats in excess of your allowed portion. Deep-fried foods or any food with a large amount of fat. Note: Ask for all fats to be served on the side, and limit your portion sizes according to your meal plan. Document Released: 08/01/2005 Document Revised: 10/24/2011 Document Reviewed: 02/19/2009 [facility] Patient Information 2013 [place], [place].

## 2012-12-21 ENCOUNTER — Ambulatory Visit: Payer: BC Managed Care – PPO | Admitting: Family Medicine

## 2013-06-23 ENCOUNTER — Other Ambulatory Visit: Payer: Self-pay | Admitting: Family Medicine

## 2015-02-19 ENCOUNTER — Encounter: Payer: No Typology Code available for payment source | Attending: Endocrinology

## 2015-02-19 VITALS — Ht 68.0 in | Wt 210.2 lb

## 2015-02-19 DIAGNOSIS — E119 Type 2 diabetes mellitus without complications: Secondary | ICD-10-CM

## 2015-02-19 DIAGNOSIS — Z713 Dietary counseling and surveillance: Secondary | ICD-10-CM | POA: Insufficient documentation

## 2015-02-19 NOTE — Progress Notes (Signed)

## 2015-02-26 DIAGNOSIS — E119 Type 2 diabetes mellitus without complications: Secondary | ICD-10-CM

## 2015-02-26 NOTE — Progress Notes (Signed)

## 2015-03-05 DIAGNOSIS — E119 Type 2 diabetes mellitus without complications: Secondary | ICD-10-CM

## 2015-03-05 NOTE — Progress Notes (Signed)
Patient was seen on 03/05/2015 for the third of a series of three diabetes self-management courses at the [facility]. The following learning objectives were met by the patient during this class:  . State the amount of activity recommended for healthy living . Describe activities suitable for individual needs . Identify ways to regularly incorporate activity into daily life . Identify barriers to activity and ways to over come these barriers  Identify diabetes medications being personally used and their primary action for lowering glucose and possible side effects . Describe role of stress on blood glucose and develop strategies to address psychosocial issues . Identify diabetes complications and ways to prevent them  Explain how to manage diabetes during illness . Evaluate success in meeting personal goal . Establish 2-3 goals that they will plan to diligently work on until they return for the  52-monthfollow-up visit  Goals:   I will count my carb choices at most meals and snacks  I will be active 30 minutes or more 5 times a week  I will test my glucose at least 2 times a day, 7 days a week  Your patient has identified these potential barriers to change:  Motivation  Your patient has identified their diabetes self-care support plan as  Family Support On-line Resources  Plan:  Attend Core 4 in 4 months

## 2015-11-18 DIAGNOSIS — E1165 Type 2 diabetes mellitus with hyperglycemia: Secondary | ICD-10-CM | POA: Diagnosis not present

## 2015-11-18 DIAGNOSIS — E049 Nontoxic goiter, unspecified: Secondary | ICD-10-CM | POA: Diagnosis not present

## 2015-11-18 DIAGNOSIS — E6609 Other obesity due to excess calories: Secondary | ICD-10-CM | POA: Diagnosis not present

## 2016-01-16 ENCOUNTER — Emergency Department (HOSPITAL_COMMUNITY): Payer: BLUE CROSS/BLUE SHIELD

## 2016-01-16 ENCOUNTER — Encounter (HOSPITAL_COMMUNITY): Payer: Self-pay | Admitting: Emergency Medicine

## 2016-01-16 ENCOUNTER — Emergency Department (HOSPITAL_COMMUNITY)
Admission: EM | Admit: 2016-01-16 | Discharge: 2016-01-16 | Disposition: A | Discharge status: Home / Self Care | Payer: BLUE CROSS/BLUE SHIELD | Attending: Emergency Medicine | Admitting: Emergency Medicine

## 2016-01-16 DIAGNOSIS — R200 Anesthesia of skin: Secondary | ICD-10-CM | POA: Insufficient documentation

## 2016-01-16 DIAGNOSIS — Z7984 Long term (current) use of oral hypoglycemic drugs: Secondary | ICD-10-CM | POA: Insufficient documentation

## 2016-01-16 DIAGNOSIS — E119 Type 2 diabetes mellitus without complications: Secondary | ICD-10-CM | POA: Diagnosis not present

## 2016-01-16 DIAGNOSIS — Z791 Long term (current) use of non-steroidal anti-inflammatories (NSAID): Secondary | ICD-10-CM | POA: Diagnosis not present

## 2016-01-16 DIAGNOSIS — R519 Headache, unspecified: Secondary | ICD-10-CM

## 2016-01-16 DIAGNOSIS — R51 Headache: Secondary | ICD-10-CM | POA: Insufficient documentation

## 2016-01-16 LAB — CBC WITH DIFFERENTIAL/PLATELET
Basophils Absolute: 0 10*3/uL (ref 0.0–0.1)
Basophils Relative: 0 %
Eosinophils Absolute: 0.1 10*3/uL (ref 0.0–0.7)
Eosinophils Relative: 2 %
HCT: 35.7 % — ABNORMAL LOW (ref 36.0–46.0)
Hemoglobin: 11.6 g/dL — ABNORMAL LOW (ref 12.0–15.0)
Lymphocytes Relative: 37 %
Lymphs Abs: 2.5 10*3/uL (ref 0.7–4.0)
MCH: 26.4 pg (ref 26.0–34.0)
MCHC: 32.5 g/dL (ref 30.0–36.0)
MCV: 81.3 fL (ref 78.0–100.0)
Monocytes Absolute: 0.4 10*3/uL (ref 0.1–1.0)
Monocytes Relative: 6 %
Neutro Abs: 3.7 10*3/uL (ref 1.7–7.7)
Neutrophils Relative %: 55 %
Platelets: 287 10*3/uL (ref 150–400)
RBC: 4.39 MIL/uL (ref 3.87–5.11)
RDW: 14.6 % (ref 11.5–15.5)
WBC: 6.8 10*3/uL (ref 4.0–10.5)

## 2016-01-16 LAB — I-STAT CHEM 8, ED
BUN: 10 mg/dL (ref 6–20)
Calcium, Ion: 1.2 mmol/L (ref 1.12–1.23)
Chloride: 102 mmol/L (ref 101–111)
Creatinine, Ser: 0.7 mg/dL (ref 0.44–1.00)
Glucose, Bld: 249 mg/dL — ABNORMAL HIGH (ref 65–99)
HCT: 37 % (ref 36.0–46.0)
Hemoglobin: 12.6 g/dL (ref 12.0–15.0)
Potassium: 3.7 mmol/L (ref 3.5–5.1)
Sodium: 140 mmol/L (ref 135–145)
TCO2: 25 mmol/L (ref 0–100)

## 2016-01-16 MED ORDER — IBUPROFEN 400 MG PO TABS: 400.0000 mg | ORAL_TABLET | Freq: Four times a day (QID) | ORAL | Status: DC | PRN

## 2016-01-16 MED ORDER — IOPAMIDOL (ISOVUE-370) INJECTION 76%
100.0000 mL | Freq: Once | INTRAVENOUS | Status: AC | PRN
  Administered 2016-01-16: 100 mL via INTRAVENOUS

## 2016-01-16 MED ORDER — SODIUM CHLORIDE 0.9 % IV BOLUS (SEPSIS)
1000.0000 mL | Freq: Once | INTRAVENOUS | Status: AC
  Administered 2016-01-16: 1000 mL via INTRAVENOUS

## 2016-01-16 MED ORDER — KETOROLAC TROMETHAMINE 30 MG/ML IJ SOLN
15.0000 mg | Freq: Once | INTRAMUSCULAR | Status: AC
  Administered 2016-01-16: 15 mg via INTRAVENOUS
  Filled 2016-01-16: qty 1

## 2016-01-16 MED ORDER — METOCLOPRAMIDE HCL 5 MG/ML IJ SOLN
10.0000 mg | Freq: Once | INTRAMUSCULAR | Status: AC
  Administered 2016-01-16: 10 mg via INTRAVENOUS
  Filled 2016-01-16: qty 2

## 2016-01-16 NOTE — ED Notes (Signed)
Pt c/o sharp pain in head that radiates down her right arm. States her right arm/hand is numb. Started at work approx 0930 today.

## 2016-01-16 NOTE — ED Provider Notes (Signed)
CSN: [id number]     Arrival date & time 01/16/16  1021 History   First MD Initiated Contact with Patient 01/16/16 1036     Chief Complaint  Patient presents with  . Headache  . Numbness    hand     (Consider location/radiation/quality/duration/timing/severity/associated sxs/prior Treatment) HPI Comments: Pt comes in with cc of headaches. She has no hx of headaches. Reports fam hx of brain AN and bleeds on her father's side of the family. Pt reports that at 9:30 am today, she started having severe R sided headaches, throbbing, squeezing type pain. Pain radiates from her forehead, to the back of her head and neck into her R upper extremity. Pt was unable to make a good fist, and she had some numbness in her RUE. The symptoms lasted for just a few minutes, resolved completely, and then returned again after 5 minutes. The pain since then has been 6/10, not as sharp. Pt has no RUE numbness anymore.   ROS 10 Systems reviewed and are negative for acute change except as noted in the HPI.     Patient is a 40 y.o. female presenting with headaches. The history is provided by the patient.  Headache   Past Medical History  Diagnosis Date  . Diabetes mellitus without complication ([place])    History reviewed. No pertinent past surgical history. No family history on file. Social History  Substance Use Topics  . Smoking status: Never Smoker   . Smokeless tobacco: None  . Alcohol Use: None   OB History    No data available     Review of Systems  Neurological: Positive for headaches.      Allergies  Review of patient's allergies indicates no known allergies.  Home Medications   Prior to Admission medications   Medication Sig Start Date End Date Taking? Authorizing Provider  glyBURIDE-metformin (GLUCOVANCE) 2.5-500 MG tablet Take 1 tablet by mouth 2 (two) times daily. 12/15/15  Yes Historical Provider, MD  ibuprofen (ADVIL,MOTRIN) 400 MG tablet Take 1 tablet (400 mg total) by mouth  every 6 (six) hours as needed. 01/16/16   Zaedyn Covin, MD   BP 115/67 mmHg  Pulse 84  Temp(Src) 98.6 F (37 C) (Oral)  Resp 16  SpO2 99%  LMP 01/13/2016 Physical Exam  Constitutional: She is oriented to person, place, and time. She appears well-developed.  HENT:  Head: Normocephalic and atraumatic.  Eyes: Conjunctivae and EOM are normal. Pupils are equal, round, and reactive to light.  Neck: Normal range of motion. Neck supple.  Cardiovascular: Normal rate, regular rhythm, normal heart sounds and intact distal pulses.   Pulmonary/Chest: Effort normal and breath sounds normal. No respiratory distress.  Abdominal: Soft. Bowel sounds are normal. She exhibits no distension. There is no tenderness. There is no rebound and no guarding.  Neurological: She is alert and oriented to person, place, and time. No cranial nerve deficit. Coordination normal.  Cerebellar exam is normal (finger to nose) Sensory exam normal for bilateral upper and lower extremities - and patient is able to discriminate between sharp and dull. Motor exam is 4+/5   Skin: Skin is warm and dry.    ED Course  Procedures (including critical care time) Labs Review Labs Reviewed  CBC WITH DIFFERENTIAL/PLATELET - Abnormal; Notable for the following:    Hemoglobin 11.6 (*)    HCT 35.7 (*)    All other components within normal limits  I-STAT CHEM 8, ED - Abnormal; Notable for the following:    Glucose,  Bld 249 (*)    All other components within normal limits    Imaging Review Ct Angio Head W/cm &/or Wo Cm  01/16/2016  CLINICAL DATA:  Diabetes. Right-sided headache and neck pain. Family history of cerebral aneurysm. Numbness in the right upper extremity. The numbness has resolved. The headaches are intermittent. EXAM: CT ANGIOGRAPHY HEAD AND NECK TECHNIQUE: Multidetector CT imaging of the head and neck was performed using the standard protocol during bolus administration of intravenous contrast. Multiplanar CT image  reconstructions and MIPs were obtained to evaluate the vascular anatomy. Carotid stenosis measurements (when applicable) are obtained utilizing NASCET criteria, using the distal internal carotid diameter as the denominator. CONTRAST:  100 mL Isovue 370 COMPARISON:  None. FINDINGS: CT HEAD Brain: No acute infarct, hemorrhage, or mass lesion is present. The ventricles are of normal size. No significant extraaxial fluid collection is present. Calvarium and skull base: Intact. Paranasal sinuses: Clear Orbits: Within normal limits. CTA NECK Aortic arch: The patient was moving during the exam. This changed the position and the aortic arch was incompletely imaged. The proximal great vessels are unremarkable. Right carotid system: The right common carotid artery is within normal limits. The carotid bifurcation is within normal limits. The cervical right ICA is within normal limits. There is distortion with the patient moved. Left carotid system: The left common carotid artery is within normal limits. The left carotid bifurcation is normal. The cervical left ICA is unremarkable. Again, there is distortion with the patient has moved. Vertebral arteries:Both vertebral arteries originate from the subclavian arteries without significant stenosis. Left vertebral artery is dominant. There is motion distortion at the C1-2 level. No focal stenosis is present. Skeleton: Vertebral body heights and alignment are maintained. There is some straightening of the normal cervical lordosis. Other neck: The soft tissues the neck are within normal limits. Lung apices are clear. CTA HEAD Anterior circulation: The internal carotid arteries are within normal limits from the skullbase through the ICA termini. The A1 and M1 segments are normal. The MCA bifurcations are intact. ACA and MCA branch vessels are within normal limits. Posterior circulation: The PICA origins are visualized and normal. The vertebrobasilar junction is within normal limits.  Both posterior cerebral arteries originate from the basilar tip. A right posterior communicating artery is present. Venous sinuses: The dural sinuses are patent. The straight sinus and deep cerebral veins are intact. Cortical veins are unremarkable. Anatomic variants: None Delayed phase: The postcontrast images demonstrate no pathologic enhancement. IMPRESSION: 1. Negative pre and postcontrast CT of the head. No acute or focal lesion to explain the patient's headaches or numbness. 2. Negative CTA of the neck. No significant carotid or vertebral artery stenosis. 3. Negative CTA circle of Willis. No significant proximal stenosis, aneurysm, or branch vessel occlusion. Electronically Signed   By: San Morelle M.D.   On: 01/16/2016 13:05   Ct Angio Neck W/cm &/or Wo/cm  01/16/2016  CLINICAL DATA:  Diabetes. Right-sided headache and neck pain. Family history of cerebral aneurysm. Numbness in the right upper extremity. The numbness has resolved. The headaches are intermittent. EXAM: CT ANGIOGRAPHY HEAD AND NECK TECHNIQUE: Multidetector CT imaging of the head and neck was performed using the standard protocol during bolus administration of intravenous contrast. Multiplanar CT image reconstructions and MIPs were obtained to evaluate the vascular anatomy. Carotid stenosis measurements (when applicable) are obtained utilizing NASCET criteria, using the distal internal carotid diameter as the denominator. CONTRAST:  100 mL Isovue 370 COMPARISON:  None. FINDINGS: CT HEAD Brain: No  acute infarct, hemorrhage, or mass lesion is present. The ventricles are of normal size. No significant extraaxial fluid collection is present. Calvarium and skull base: Intact. Paranasal sinuses: Clear Orbits: Within normal limits. CTA NECK Aortic arch: The patient was moving during the exam. This changed the position and the aortic arch was incompletely imaged. The proximal great vessels are unremarkable. Right carotid system: The right  common carotid artery is within normal limits. The carotid bifurcation is within normal limits. The cervical right ICA is within normal limits. There is distortion with the patient moved. Left carotid system: The left common carotid artery is within normal limits. The left carotid bifurcation is normal. The cervical left ICA is unremarkable. Again, there is distortion with the patient has moved. Vertebral arteries:Both vertebral arteries originate from the subclavian arteries without significant stenosis. Left vertebral artery is dominant. There is motion distortion at the C1-2 level. No focal stenosis is present. Skeleton: Vertebral body heights and alignment are maintained. There is some straightening of the normal cervical lordosis. Other neck: The soft tissues the neck are within normal limits. Lung apices are clear. CTA HEAD Anterior circulation: The internal carotid arteries are within normal limits from the skullbase through the ICA termini. The A1 and M1 segments are normal. The MCA bifurcations are intact. ACA and MCA branch vessels are within normal limits. Posterior circulation: The PICA origins are visualized and normal. The vertebrobasilar junction is within normal limits. Both posterior cerebral arteries originate from the basilar tip. A right posterior communicating artery is present. Venous sinuses: The dural sinuses are patent. The straight sinus and deep cerebral veins are intact. Cortical veins are unremarkable. Anatomic variants: None Delayed phase: The postcontrast images demonstrate no pathologic enhancement. IMPRESSION: 1. Negative pre and postcontrast CT of the head. No acute or focal lesion to explain the patient's headaches or numbness. 2. Negative CTA of the neck. No significant carotid or vertebral artery stenosis. 3. Negative CTA circle of Willis. No significant proximal stenosis, aneurysm, or branch vessel occlusion. Electronically Signed   By: San Morelle M.D.   On: 01/16/2016  13:05   I have personally reviewed and evaluated these images and lab results as part of my medical decision-making.   EKG Interpretation None      MDM   Final diagnoses:  Nonintractable headache, unspecified chronicity pattern, unspecified headache type    Pt comes in with cc of h/a, neck pain.  DDX includes: Primary headaches - her symptoms are not classic of any. ICH Carotid / vertebral dissection Cavernous sinus thrombosis Tumor Vascular headaches AV malformation Brain aneurysm  A/P: Pt comes in with cc of headaches and neck pain. She had some RUE numbness as well, which has now resolved. She reports fam hx of brain AN.  Sudden, severe pain - which is now better, with some neuro deficits. CT-A head and neck ordered. R/o SAH and dissection / aneurysm in the process.  Hx of DM. No clinical concerns for infectious process.  2:28 PM Upon reassessment, patient reports that the headache has resolved. She continued to have no neurologic complains. Strict return precautions discussed, pt will return to the ER if there is visual complains, seizures, altered mental status, loss of consciousness, dizziness, new focal weakness, or numbness.       Varney Biles, MD 01/16/16 1428

## 2016-01-16 NOTE — Discharge Instructions (Signed)

## 2016-01-18 ENCOUNTER — Encounter: Payer: Self-pay | Admitting: Family Medicine

## 2016-03-17 DIAGNOSIS — E6609 Other obesity due to excess calories: Secondary | ICD-10-CM | POA: Diagnosis not present

## 2016-03-17 DIAGNOSIS — E1165 Type 2 diabetes mellitus with hyperglycemia: Secondary | ICD-10-CM | POA: Diagnosis not present

## 2016-03-17 DIAGNOSIS — E049 Nontoxic goiter, unspecified: Secondary | ICD-10-CM | POA: Diagnosis not present

## 2016-07-18 DIAGNOSIS — E6609 Other obesity due to excess calories: Secondary | ICD-10-CM | POA: Diagnosis not present

## 2016-07-18 DIAGNOSIS — E049 Nontoxic goiter, unspecified: Secondary | ICD-10-CM | POA: Diagnosis not present

## 2016-07-18 DIAGNOSIS — E1165 Type 2 diabetes mellitus with hyperglycemia: Secondary | ICD-10-CM | POA: Diagnosis not present

## 2016-10-03 DIAGNOSIS — E119 Type 2 diabetes mellitus without complications: Secondary | ICD-10-CM | POA: Diagnosis not present

## 2016-10-10 DIAGNOSIS — E049 Nontoxic goiter, unspecified: Secondary | ICD-10-CM | POA: Diagnosis not present

## 2016-10-10 DIAGNOSIS — E1165 Type 2 diabetes mellitus with hyperglycemia: Secondary | ICD-10-CM | POA: Diagnosis not present

## 2016-10-17 ENCOUNTER — Ambulatory Visit (INDEPENDENT_AMBULATORY_CARE_PROVIDER_SITE_OTHER): Payer: BLUE CROSS/BLUE SHIELD | Admitting: Obstetrics & Gynecology

## 2016-10-17 ENCOUNTER — Other Ambulatory Visit: Payer: Self-pay | Admitting: Obstetrics & Gynecology

## 2016-10-17 ENCOUNTER — Encounter: Payer: Self-pay | Admitting: Obstetrics & Gynecology

## 2016-10-17 VITALS — BP 113/85 | HR 82 | Resp 18 | Ht 68.0 in | Wt 214.0 lb

## 2016-10-17 DIAGNOSIS — Z1231 Encounter for screening mammogram for malignant neoplasm of breast: Secondary | ICD-10-CM

## 2016-10-17 DIAGNOSIS — R102 Pelvic and perineal pain: Secondary | ICD-10-CM

## 2016-10-17 DIAGNOSIS — Z01419 Encounter for gynecological examination (general) (routine) without abnormal findings: Secondary | ICD-10-CM

## 2016-10-17 DIAGNOSIS — N920 Excessive and frequent menstruation with regular cycle: Secondary | ICD-10-CM

## 2016-10-17 DIAGNOSIS — E049 Nontoxic goiter, unspecified: Secondary | ICD-10-CM | POA: Diagnosis not present

## 2016-10-17 DIAGNOSIS — R1032 Left lower quadrant pain: Secondary | ICD-10-CM | POA: Diagnosis not present

## 2016-10-17 DIAGNOSIS — E1165 Type 2 diabetes mellitus with hyperglycemia: Secondary | ICD-10-CM | POA: Diagnosis not present

## 2016-10-17 DIAGNOSIS — E6609 Other obesity due to excess calories: Secondary | ICD-10-CM | POA: Diagnosis not present

## 2016-10-17 DIAGNOSIS — Z0000 Encounter for general adult medical examination without abnormal findings: Secondary | ICD-10-CM | POA: Diagnosis not present

## 2016-10-17 NOTE — Patient Instructions (Addendum)
  To manage bleeding:  Hormones vs Lysteda (do research about these)   Thank you for enrolling in [facility]. Please follow the instructions below to securely access your online medical record. MyChart allows you to send messages to your doctor, view your test results, manage appointments, and more.   How Do I Sign Up? 1. In your Internet browser, go to [employer] and enter [email or web address]. 2. Click on the Sign Up Now link in the Sign In box. You will see the New Member Sign Up page. 3. Enter your MyChart Access Code exactly as it appears below. You will not need to use this code after you've completed the sign-up process. If you do not sign up before the expiration date, you must request a new code.  MyChart Access Code: [email or web address] Expires: 12/16/2016  3:07 PM  4. Enter your Social Security Number ([id number]) and Date of Birth (mm/dd/yyyy) as indicated and click Submit. You will be taken to the next sign-up page. 5. Create a MyChart ID. This will be your MyChart login ID and cannot be changed, so think of one that is secure and easy to remember. 6. Create a MyChart password. You can change your password at any time. 7. Enter your Password Reset Question and Answer. This can be used at a later time if you forget your password.  8. Enter your e-mail address. You will receive e-mail notification when new information is available in [facility]. 9. Click Sign Up. You can now view your medical record.   Additional Information Remember, MyChart is NOT to be used for urgent needs. For medical emergencies, dial 911.

## 2016-10-17 NOTE — Progress Notes (Signed)
GYNECOLOGY ANNUAL PREVENTATIVE CARE ENCOUNTER NOTE  Subjective:   Robin Kennedy is a 41 y.o. [phone] female here for a routine annual gynecologic exam.  Current complaints: periodic left lower abdominal pain x 4 months; not related to menses, food or other cause.  Alleviated by NSAIDs, exacerbated by intercourse. Also noticed heavier flow during her menstrual periods since past 4 months.  Denies abnormal vaginal discharge or other gynecologic concerns.    Gynecologic History Patient's last menstrual period was 10/09/2016. Contraception: tubal ligation Last Pap: 3 years ago. Results were: normal Last mammogram: 2013. Results were: normal  Obstetric History OB History  Gravida Para Term Preterm AB Living  4 3 3  0 1 3  SAB TAB Ectopic Multiple Live Births  0 1 0   3    # Outcome Date GA Lbr Len/2nd Weight Sex Delivery Anes PTL Lv  4 Term 02/06/02    M Vag-Spont   LIV  3 TAB 1998          2 Term 10/09/95    M Vag-Spont   LIV  1 Term 11/19/93    F Vag-Spont   LIV      Past Medical History:  Diagnosis Date  . Diabetes mellitus   . Diabetes mellitus without complication [facility])     Past Surgical History:  Procedure Laterality Date  . TUBAL LIGATION      Current Outpatient Prescriptions on File Prior to Visit  Medication Sig Dispense Refill  . glucose blood (ONE TOUCH ULTRA TEST) test strip Use to check cbgs qd 100 each 11   No current facility-administered medications on file prior to visit.     No Known Allergies  Social History   Social History  . Marital status: Married    Spouse name: N/A  . Number of children: N/A  . Years of education: N/A   Occupational History  . Not on file.   Social History Main Topics  . Smoking status: Never Smoker  . Smokeless tobacco: Never Used  . Alcohol use No  . Drug use: No  . Sexual activity: Yes    Birth control/ protection: Surgical   Other Topics Concern  . Not on file   Social History Narrative   **  Merged History Encounter **        Family History  Problem Relation Age of Onset  . Diabetes Brother   . Asthma Daughter   . GER disease Daughter   . Stroke Paternal Grandmother   . Hyperlipidemia Maternal Aunt   . Stroke Maternal Uncle     The following portions of the patient's history were reviewed and updated as appropriate: allergies, current medications, past family history, past medical history, past social history, past surgical history and problem list.  Review of Systems Pertinent items noted in HPI and remainder of comprehensive ROS otherwise negative.   Objective:  BP 113/85 (BP Location: Left Arm, Patient Position: Sitting, Cuff Size: Large)   Pulse 82   Resp 18   Ht 5\' 8"  (1.727 m)   Wt 214 lb (97.1 kg)   LMP 10/09/2016   BMI 32.54 kg/m  CONSTITUTIONAL: Well-developed, well-nourished female in no acute distress.  HENT:  Normocephalic, atraumatic, External right and left ear normal. Oropharynx is clear and moist EYES: Conjunctivae and EOM are normal. Pupils are equal, round, and reactive to light. No scleral icterus.  NECK: Normal range of motion, supple, no masses.  Normal thyroid.  SKIN: Skin is warm and dry. No  rash noted. Not diaphoretic. No erythema. No pallor. NEUROLOGIC: Alert and oriented to person, place, and time. Normal reflexes, muscle tone coordination. No cranial nerve deficit noted. PSYCHIATRIC: Normal mood and affect. Normal behavior. Normal judgment and thought content. CARDIOVASCULAR: Normal heart rate noted, regular rhythm RESPIRATORY: Clear to auscultation bilaterally. Effort and breath sounds normal, no problems with respiration noted. BREASTS: Symmetric in size. No masses, skin changes, nipple drainage, or lymphadenopathy. ABDOMEN: Soft, normal bowel sounds, no distention noted.  Mild left adnexal tenderness, no rebound or guarding.  PELVIC: Normal appearing external genitalia; normal appearing vaginal mucosa and cervix.  No abnormal discharge  noted.  Pap smear obtained.  Normal uterine size, no other palpable masses, no uterine tenderness but has left adnexal tenderness. MUSCULOSKELETAL: Normal range of motion. No tenderness.  No cyanosis, clubbing, or edema.  2+ distal pulses.   Assessment:  Annual gynecologic examination with pap smear Pelvic pain and menorrhagia   Plan:  Will follow up results of pap smear and manage accordingly. Pelvic ultrasound ordered for further evaluation of pain; CBC and TSH checked for menorrhagia. Offered hormonal management vs Lysteda for AUB; she declined for now. Mammogram scheduled, routine preventative health maintenance measures emphasized. Please refer to After Visit Summary for other counseling recommendations.    Verita Schneiders, MD, [place] Attending Obstetrician & Gynecologist, [place] for [facility]

## 2016-10-17 NOTE — Progress Notes (Signed)
Pt here today to establish care and have her annual exam.  Currently c/o pelvic pain on and off over the past 4 months as well as a change in her menstrual cycle with an increased flow.

## 2016-10-19 LAB — TSH: TSH: 1.41 u[IU]/mL (ref 0.450–4.500)

## 2016-10-21 ENCOUNTER — Ambulatory Visit (HOSPITAL_COMMUNITY)
Admission: RE | Admit: 2016-10-21 | Discharge: 2016-10-21 | Disposition: A | Discharge status: Home / Self Care | Payer: BLUE CROSS/BLUE SHIELD | Source: Ambulatory Visit | Attending: Obstetrics & Gynecology | Admitting: Obstetrics & Gynecology

## 2016-10-21 DIAGNOSIS — N920 Excessive and frequent menstruation with regular cycle: Secondary | ICD-10-CM

## 2016-10-21 DIAGNOSIS — D252 Subserosal leiomyoma of uterus: Secondary | ICD-10-CM | POA: Insufficient documentation

## 2016-10-21 DIAGNOSIS — R102 Pelvic and perineal pain: Secondary | ICD-10-CM

## 2016-10-21 DIAGNOSIS — D250 Submucous leiomyoma of uterus: Secondary | ICD-10-CM | POA: Insufficient documentation

## 2016-10-21 LAB — CYTOLOGY - PAP
Bacterial vaginitis: NEGATIVE
Candida vaginitis: NEGATIVE
Chlamydia: NEGATIVE
Diagnosis: NEGATIVE
HPV: NOT DETECTED
Neisseria Gonorrhea: NEGATIVE
Trichomonas: NEGATIVE

## 2016-10-31 ENCOUNTER — Ambulatory Visit
Admission: RE | Admit: 2016-10-31 | Discharge: 2016-10-31 | Disposition: A | Discharge status: Home / Self Care | Payer: BLUE CROSS/BLUE SHIELD | Source: Ambulatory Visit | Attending: Obstetrics & Gynecology | Admitting: Obstetrics & Gynecology

## 2016-10-31 DIAGNOSIS — Z1231 Encounter for screening mammogram for malignant neoplasm of breast: Secondary | ICD-10-CM

## 2016-11-07 ENCOUNTER — Ambulatory Visit (INDEPENDENT_AMBULATORY_CARE_PROVIDER_SITE_OTHER): Payer: BLUE CROSS/BLUE SHIELD | Admitting: Obstetrics & Gynecology

## 2016-11-07 ENCOUNTER — Encounter: Payer: Self-pay | Admitting: Obstetrics & Gynecology

## 2016-11-07 ENCOUNTER — Ambulatory Visit: Payer: BLUE CROSS/BLUE SHIELD | Admitting: Obstetrics & Gynecology

## 2016-11-07 VITALS — BP 104/71 | HR 76 | Ht 68.0 in | Wt 216.0 lb

## 2016-11-07 DIAGNOSIS — D250 Submucous leiomyoma of uterus: Secondary | ICD-10-CM

## 2016-11-07 DIAGNOSIS — D251 Intramural leiomyoma of uterus: Secondary | ICD-10-CM

## 2016-11-07 DIAGNOSIS — R102 Pelvic and perineal pain: Secondary | ICD-10-CM

## 2016-11-07 DIAGNOSIS — N920 Excessive and frequent menstruation with regular cycle: Secondary | ICD-10-CM | POA: Diagnosis not present

## 2016-11-07 DIAGNOSIS — D252 Subserosal leiomyoma of uterus: Secondary | ICD-10-CM

## 2016-11-07 NOTE — Progress Notes (Addendum)
GYNECOLOGY OFFICE VISIT NOTE  History:  41 y.o. [id number] here today for discussion of ultrasound results; done for menorrhagia and pelvic pain.  No current bleeding, pain is intermittent, mild-moderate and attributed to her large fibroid.  She denies any abnormal vaginal discharge, bleeding or other concerns.   Past Medical History:  Diagnosis Date  . Diabetes mellitus without complication [facility])     Past Surgical History:  Procedure Laterality Date  . TUBAL LIGATION      The following portions of the patient's history were reviewed and updated as appropriate: allergies, current medications, past family history, past medical history, past social history, past surgical history and problem list.   Health Maintenance:  Normal pap and negative HRHPV on 10/17/2016.    Review of Systems:  Pertinent items noted in HPI and remainder of comprehensive ROS otherwise negative.   Objective:  Physical Exam BP 104/71 (BP Location: Right Arm, Patient Position: Sitting, Cuff Size: Large)   Pulse 76   Ht 5\' 8"  (1.727 m)   Wt 216 lb (98 kg)   LMP 10/09/2016   BMI 32.84 kg/m  CONSTITUTIONAL: Well-developed, well-nourished female in no acute distress.  HENT:  Normocephalic, atraumatic. External right and left ear normal. Oropharynx is clear and moist EYES: Conjunctivae and EOM are normal. Pupils are equal, round, and reactive to light. No scleral icterus.  NECK: Normal range of motion, supple, no masses SKIN: Skin is warm and dry. No rash noted. Not diaphoretic. No erythema. No pallor. NEUROLOGIC: Alert and oriented to person, place, and time. Normal reflexes, muscle tone coordination. No cranial nerve deficit noted. PSYCHIATRIC: Normal mood and affect. Normal behavior. Normal judgment and thought content. CARDIOVASCULAR: Normal heart rate noted RESPIRATORY: Effort and breath sounds normal, no problems with respiration noted ABDOMEN: Soft, no distention noted.   PELVIC: Deferred MUSCULOSKELETAL:  Normal range of motion. No edema noted.  Labs and Imaging US Transvaginal Non-ob  Result Date: 10/21/2016 CLINICAL DATA:  Menorrhagia for 4 months. EXAM: TRANSABDOMINAL AND TRANSVAGINAL ULTRASOUND OF PELVIS TECHNIQUE: Both transabdominal and transvaginal ultrasound examinations of the pelvis were performed. Transabdominal technique was performed for global imaging of the pelvis including uterus, ovaries, adnexal regions, and pelvic cul-de-sac. It was necessary to proceed with endovaginal exam following the transabdominal exam to visualize the endometrium and ovaries. COMPARISON:  None FINDINGS: Uterus Measurements: 9.1 x 10.5 x 7.2 cm. 7.5 x 5.6 x 7 cm hypoechoic anterior fundal subserosal fibroid. 1.3 x 1 x 1.4 cm intramural left fundal hypoechoic mass consistent with a fibroid. 1.2 x 0.9 x 1.1 cm submucosal left posterior uterine mass most consistent with a fibroid. Endometrium Thickness: 10.3 mm.  No focal abnormality visualized. Right ovary Measurements: 3.8 x 1.9 x 2 cm. Normal appearance/no adnexal mass. Left ovary Not visualize. Other findings Trace pelvic free fluid in the right adnexa. IMPRESSION: 1. Multiple uterine fibroids as described above. Electronically Signed   By: Kathreen Devoid   On: 10/21/2016 20:06   US Pelvis Complete  Result Date: 10/21/2016 CLINICAL DATA:  Menorrhagia for 4 months. EXAM: TRANSABDOMINAL AND TRANSVAGINAL ULTRASOUND OF PELVIS TECHNIQUE: Both transabdominal and transvaginal ultrasound examinations of the pelvis were performed. Transabdominal technique was performed for global imaging of the pelvis including uterus, ovaries, adnexal regions, and pelvic cul-de-sac. It was necessary to proceed with endovaginal exam following the transabdominal exam to visualize the endometrium and ovaries. COMPARISON:  None FINDINGS: Uterus Measurements: 9.1 x 10.5 x 7.2 cm. 7.5 x 5.6 x 7 cm hypoechoic anterior fundal subserosal fibroid.  1.3 x 1 x 1.4 cm intramural left fundal hypoechoic mass  consistent with a fibroid. 1.2 x 0.9 x 1.1 cm submucosal left posterior uterine mass most consistent with a fibroid. Endometrium Thickness: 10.3 mm.  No focal abnormality visualized. Right ovary Measurements: 3.8 x 1.9 x 2 cm. Normal appearance/no adnexal mass. Left ovary Not visualize. Other findings Trace pelvic free fluid in the right adnexa. IMPRESSION: 1. Multiple uterine fibroids as described above. Electronically Signed   By: Kathreen Devoid   On: 10/21/2016 20:06   Mm Screening Breast Tomo Bilateral  Result Date: 10/31/2016 CLINICAL DATA:  Screening. EXAM: 2D DIGITAL SCREENING BILATERAL MAMMOGRAM WITH CAD AND ADJUNCT TOMO COMPARISON:  Previous exam(s). ACR Breast Density Category b: There are scattered areas of fibroglandular density. FINDINGS: There are no findings suspicious for malignancy. Images were processed with CAD. IMPRESSION: No mammographic evidence of malignancy. A result letter of this screening mammogram will be mailed directly to the patient. RECOMMENDATION: Screening mammogram in one year. (Code:SM-B-01Y) BI-RADS CATEGORY  1: Negative. Electronically Signed   By: Curlene Dolphin M.D.   On: 10/31/2016 07:39    Assessment & Plan:  1. Menorrhagia with regular cycle 2. Pelvic pain in female 3. Intramural, submucous, and subserous leiomyoma of uterus Discussed ultrasound findings with patient and management options.  She does not want medical options as she is worried about the large 7 cm fibroid causing her pain. She initially desired laparoscopic myomectomy and removal of smaller fibroids (via possible hysteroscopy) for treatment of her AUB.  After much discussion with her and emphasizing that she is done with childbearing and had a tubal ligation, she changed her mind and desires a hysterectomy. Worried about recovery time and wants minimally invasive surgery.  Discussed option of robotic-assisted laparoscopic hysterectomy.  Appointment made with Dr. Lavonia Drafts at [facility] to have further discussion about this surgical modality.    Routine preventative health maintenance measures emphasized. Please refer to After Visit Summary for other counseling recommendations.   Return for appointment scheduled with Dr. Ihor Dow at [facility] on 11/14/16 at 8:15 am.   Total face-to-face time with patient: 25 minutes. Over 50% of encounter was spent on counseling and coordination of care.   Verita Schneiders, MD, [place] Attending [place], [facility] for [employer], [place]

## 2016-11-07 NOTE — Patient Instructions (Signed)

## 2016-11-14 ENCOUNTER — Encounter (HOSPITAL_COMMUNITY): Payer: Self-pay

## 2016-11-14 ENCOUNTER — Encounter: Payer: Self-pay | Admitting: Obstetrics & Gynecology

## 2016-11-14 ENCOUNTER — Ambulatory Visit (INDEPENDENT_AMBULATORY_CARE_PROVIDER_SITE_OTHER): Payer: BLUE CROSS/BLUE SHIELD | Admitting: Obstetrics & Gynecology

## 2016-11-14 VITALS — BP 114/63 | HR 92 | Ht 68.0 in | Wt 218.0 lb

## 2016-11-14 DIAGNOSIS — D250 Submucous leiomyoma of uterus: Secondary | ICD-10-CM | POA: Diagnosis not present

## 2016-11-14 DIAGNOSIS — R102 Pelvic and perineal pain: Secondary | ICD-10-CM

## 2016-11-14 DIAGNOSIS — D252 Subserosal leiomyoma of uterus: Secondary | ICD-10-CM

## 2016-11-14 DIAGNOSIS — D251 Intramural leiomyoma of uterus: Secondary | ICD-10-CM

## 2016-11-14 NOTE — Progress Notes (Signed)
History:  41 y.o. [id number] here today for eval of pelvic pain and bleeding. LMP 11/06/2016.  Pt denies intermenstral bleeding but, reports heavy menses.  Pt reports that she has been having lots of pain with intercourse.  She can feel movement in her pelvis.  Pt is here on referral from Dr. Harolyn Rutherford for hysterectomy she has already been evaluated with exam and [place].  Pt requests minimally invasive surgery so that she can go back to work quickly. 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   The following portions of the patient's history were reviewed and updated as appropriate: allergies, current medications, past family history, past medical history, past social history, past surgical history and problem list.  Review of Systems:  Pertinent items are noted in HPI.   Objective:  Physical Exam Blood pressure 114/63, pulse 92, height 5\' 8"  (1.727 m), weight 218 lb (98.9 kg), last menstrual period 11/06/2016. BP 114/63 (BP Location: Left Arm)   Pulse 92   Ht 5\' 8"  (1.727 m)   Wt 218 lb (98.9 kg)   LMP 11/06/2016 (Exact Date)   BMI 33.15 kg/m  CONSTITUTIONAL: Well-developed, well-nourished female in no acute distress.  HENT:  Normocephalic, atraumatic EYES: Conjunctivae and EOM are normal. No scleral icterus.  NECK: Normal range of motion SKIN: Skin is warm and dry. No rash noted. Not diaphoretic.No pallor. Bruno: Alert and oriented to person, place, and time. Normal coordination.  Abd: Soft, nontender and nondistended. No surgical incision noted. Pelvic: Normal appearing external genitalia; normal appearing vaginal mucosa and cervix.  Normal discharge.  Enlarged uterus to 11 weeks with good descensus and irreg contour, no other palpable masses, no uterine or adnexal tenderness  Labs and Imaging US Transvaginal Non-ob  Result Date: 10/21/2016 CLINICAL DATA:  Menorrhagia for 4 months. EXAM: TRANSABDOMINAL AND TRANSVAGINAL ULTRASOUND OF PELVIS TECHNIQUE: Both transabdominal  and transvaginal ultrasound examinations of the pelvis were performed. Transabdominal technique was performed for global imaging of the pelvis including uterus, ovaries, adnexal regions, and pelvic cul-de-sac. It was necessary to proceed with endovaginal exam following the transabdominal exam to visualize the endometrium and ovaries. COMPARISON:  None FINDINGS: Uterus Measurements: 9.1 x 10.5 x 7.2 cm. 7.5 x 5.6 x 7 cm hypoechoic anterior fundal subserosal fibroid. 1.3 x 1 x 1.4 cm intramural left fundal hypoechoic mass consistent with a fibroid. 1.2 x 0.9 x 1.1 cm submucosal left posterior uterine mass most consistent with a fibroid. Endometrium Thickness: 10.3 mm.  No focal abnormality visualized. Right ovary Measurements: 3.8 x 1.9 x 2 cm. Normal appearance/no adnexal mass. Left ovary Not visualize. Other findings Trace pelvic free fluid in the right adnexa. IMPRESSION: 1. Multiple uterine fibroids as described above. Electronically Signed   By: Kathreen Devoid   On: 10/21/2016 20:06   US Pelvis Complete  Result Date: 10/21/2016 CLINICAL DATA:  Menorrhagia for 4 months. EXAM: TRANSABDOMINAL AND TRANSVAGINAL ULTRASOUND OF PELVIS TECHNIQUE: Both transabdominal and transvaginal ultrasound examinations of the pelvis were performed. Transabdominal technique was performed for global imaging of the pelvis including uterus, ovaries, adnexal regions, and pelvic cul-de-sac. It was necessary to proceed with endovaginal exam following the transabdominal exam to visualize the endometrium and ovaries. COMPARISON:  None FINDINGS: Uterus Measurements: 9.1 x 10.5 x 7.2 cm. 7.5 x 5.6 x 7 cm hypoechoic anterior fundal subserosal fibroid. 1.3 x 1 x 1.4 cm intramural left fundal hypoechoic mass consistent with a fibroid. 1.2 x 0.9 x 1.1 cm submucosal left posterior uterine mass most  consistent with a fibroid. Endometrium Thickness: 10.3 mm.  No focal abnormality visualized. Right ovary Measurements: 3.8 x 1.9 x 2 cm. Normal  appearance/no adnexal mass. Left ovary Not visualize. Other findings Trace pelvic free fluid in the right adnexa. IMPRESSION: 1. Multiple uterine fibroids as described above. Electronically Signed   By: Kathreen Devoid   On: 10/21/2016 20:06   Mm Screening Breast Tomo Bilateral  Result Date: 10/31/2016 CLINICAL DATA:  Screening. EXAM: 2D DIGITAL SCREENING BILATERAL MAMMOGRAM WITH CAD AND ADJUNCT TOMO COMPARISON:  Previous exam(s). ACR Breast Density Category b: There are scattered areas of fibroglandular density. FINDINGS: There are no findings suspicious for malignancy. Images were processed with CAD. IMPRESSION: No mammographic evidence of malignancy. A result letter of this screening mammogram will be mailed directly to the patient. RECOMMENDATION: Screening mammogram in one year. (Code:[id number]-B-01Y) BI-RADS CATEGORY  1: Negative. Electronically Signed   By: Curlene Dolphin M.D.   On: 10/31/2016 07:39    Assessment & Plan:  Pelvic pain and AUB due to uterine fibroids.  Patient desires surgical management with [place] with bilateral salpingectomy.  The risks of surgery were discussed in detail with the patient including but not limited to: bleeding which may require transfusion or reoperation; infection which may require prolonged hospitalization or re-hospitalization and antibiotic therapy; injury to bowel, bladder, ureters and major vessels or other surrounding organs; need for additional procedures including laparotomy; thromboembolic phenomenon, incisional problems and other postoperative or anesthesia complications.  Patient was told that the likelihood that her condition and symptoms will be treated effectively with this surgical management was very high; the postoperative expectations were also discussed in detail. The patient also understands the alternative treatment options which were discussed in full. All questions were answered.  She was told that she will be contacted by our surgical scheduler  regarding the time and date of her surgery; routine preoperative instructions of having nothing to eat or drink after midnight on the day prior to surgery and also coming to the hospital 1 1/2 hours prior to her time of surgery were also emphasized.  She was told she may be called for a preoperative appointment about a week prior to surgery and will be given further preoperative instructions at that visit. Printed patient education handouts about the procedure were given to the patient to review at home.  Pt has diabetes that she reports is now controlled.  She reports that her HgbA1c is <7.   I have asked her to have her results faxed to my ofc.  Total face-to-face time with patient was 30 min.  Greater than 50% was spent in counseling and coordination of care with the patient.   Melisse Caetano L. Harraway-Smith, M.D., [name]

## 2016-11-14 NOTE — Patient Instructions (Addendum)
Total Laparoscopic Hysterectomy A total laparoscopic hysterectomy is a minimally invasive surgery to remove your uterus and cervix. This surgery is performed by making several small cuts (incisions) in your abdomen. It can also be done with a thin, lighted tube (laparoscope) inserted into two small incisions in your lower abdomen. Your fallopian tubes and ovaries can be removed (bilateral salpingo-oophorectomy) during this surgery as well.Benefits of minimally invasive surgery include:  Less pain.  Less risk of blood loss.  Less risk of infection.  Quicker return to normal activities.  Tell a health care provider about:  Any allergies you have.  All medicines you are taking, including vitamins, herbs, eye drops, creams, and over-the-counter medicines.  Any problems you or family members have had with anesthetic medicines.  Any blood disorders you have.  Any surgeries you have had.  Any medical conditions you have. What are the risks? Generally, this is a safe procedure. However, as with any procedure, complications can occur. Possible complications include:  Bleeding.  Blood clots in the legs or lung.  Infection.  Injury to surrounding organs.  Problems with anesthesia.  Early menopause symptoms (hot flashes, night sweats, insomnia).  Risk of conversion to an open abdominal incision.  What happens before the procedure?  Ask your health care provider about changing or stopping your regular medicines.  Do not take aspirin or blood thinners (anticoagulants) for 1 week before the surgery or as told by your health care provider.  Do not eat or drink anything for 8 hours before the surgery or as told by your health care provider.  Quit smoking if you smoke.  Arrange for a ride home after surgery and for someone to help you at home during recovery. What happens during the procedure?  You will be given antibiotic medicine.  An IV tube will be placed in your arm. You  will be given medicine to make you sleep (general anesthetic).  A gas (carbon dioxide) will be used to inflate your abdomen. This will allow your surgeon to look inside your abdomen, perform your surgery, and treat any other problems found if necessary.  Three or four small incisions (often less than 1/2 inch) will be made in your abdomen. One of these incisions will be made in the area of your belly button (navel). The laparoscope will be inserted into the incision. Your surgeon will look through the laparoscope while doing your procedure.  Other surgical instruments will be inserted through the other incisions.  Your uterus may be removed through your vagina or cut into small pieces and removed through the small incisions.  Your incisions will be closed. What happens after the procedure?  The gas will be released from inside your abdomen.  You will be taken to the recovery area where a nurse will watch and check your progress. Once you are awake, stable, and taking fluids well, without other problems, you will return to your room or be allowed to go home.  There is usually minimal discomfort following the surgery because the incisions are so small.  You will be given pain medicine while you are in the hospital and for when you go home. This information is not intended to replace advice given to you by your health care provider. Make sure you discuss any questions you have with your health care provider. Document Released: 05/29/2007 Document Revised: 01/07/2016 Document Reviewed: 02/19/2013 Elsevier Interactive Patient Education  2017 [employer]. Total Laparoscopic Hysterectomy, Care After Refer to this sheet in the next few   weeks. These instructions provide you with information on caring for yourself after your procedure. Your health care provider may also give you more specific instructions. Your treatment has been planned according to current medical practices, but problems sometimes  occur. Call your health care provider if you have any problems or questions after your procedure. What can I expect after the procedure?  Pain and bruising at the incision sites. You will be given pain medicine to control it.  Menopausal symptoms such as hot flashes, night sweats, and insomnia if your ovaries were removed.  Sore throat from the breathing tube that was inserted during surgery. Follow these instructions at home:  Only take over-the-counter or prescription medicines for pain, discomfort, or fever as directed by your health care provider.  Do not take aspirin. It can cause bleeding.  Do not drive when taking pain medicine.  Follow your health care provider's advice regarding diet, exercise, lifting, driving, and general activities.  Resume your usual diet as directed and allowed.  Get plenty of rest and sleep.  Do not douche, use tampons, or have sexual intercourse for at least 6 weeks, or until your health care provider gives you permission.  Change your bandages (dressings) as directed by your health care provider.  Monitor your temperature and notify your health care provider of a fever.  Take showers instead of baths for 2-3 weeks.  Do not drink alcohol until your health care provider gives you permission.  If you develop constipation, you may take a mild laxative with your health care provider's permission. Bran foods may help with constipation problems. Drinking enough fluids to keep your urine clear or pale yellow may help as well.  Try to have someone home with you for 1-2 weeks to help around the house.  Keep all of your follow-up appointments as directed by your health care provider. Contact a health care provider if:  You have swelling, redness, or increasing pain around your incision sites.  You have pus coming from your incision.  You notice a bad smell coming from your incision.  Your incision breaks open.  You feel dizzy or  lightheaded.  You have pain or bleeding when you urinate.  You have persistent diarrhea.  You have persistent nausea and vomiting.  You have abnormal vaginal discharge.  You have a rash.  You have any type of abnormal reaction or develop an allergy to your medicine.  You have poor pain control with your prescribed medicine. Get help right away if:  You have chest pain or shortness of breath.  You have severe abdominal pain that is not relieved with pain medicine.  You have pain or swelling in your legs. This information is not intended to replace advice given to you by your health care provider. Make sure you discuss any questions you have with your health care provider. Document Released: 05/22/2013 Document Revised: 01/07/2016 Document Reviewed: 02/19/2013 Elsevier Interactive Patient Education  2017 Elsevier Inc.  

## 2016-12-06 DIAGNOSIS — R102 Pelvic and perineal pain: Secondary | ICD-10-CM

## 2016-12-06 DIAGNOSIS — D219 Benign neoplasm of connective and other soft tissue, unspecified: Secondary | ICD-10-CM

## 2016-12-06 HISTORY — DX: Benign neoplasm of connective and other soft tissue, unspecified: D21.9

## 2016-12-08 NOTE — Patient Instructions (Addendum)
Your procedure is scheduled on:  Tuesday, May 8  Enter through the Main Entrance of [facility] at: 6 am  Pick up the phone at the desk and dial [phone].  Call this number if you have problems the morning of surgery: [phone].  Remember: Do NOT eat or drink (including water) after midnight Monday  Take these medicines the morning of surgery with a SIP OF WATER: None  Diabetes medication:  Do not take your diabetes medication on the day of surgery.  Do not take your glimepride Monday night dose.  We will check your sugar upon arrival to Short Stay and treat if needed.  Do Not Smoke on the day of surgery.  Do NOT wear jewelry (body piercing), metal hair clips/bobby pins, make-up, or nail polish. Do NOT wear lotions, powders, or perfumes.  You may wear deoderant. Do NOT shave for 48 hours prior to surgery. Do NOT bring valuables to the hospital.   Leave suitcase in car.  After surgery it may be brought to your room.  For patients admitted to the hospital, checkout time is 11:00 AM the day of discharge. Have a responsible adult drive you home and stay with you for 24 hours after your procedure.  Home with husband [name] cell [phone].

## 2016-12-12 ENCOUNTER — Encounter (HOSPITAL_COMMUNITY)
Admission: RE | Admit: 2016-12-12 | Discharge: 2016-12-12 | Disposition: A | Discharge status: Home / Self Care | Payer: BLUE CROSS/BLUE SHIELD | Source: Ambulatory Visit | Attending: Obstetrics & Gynecology | Admitting: Obstetrics & Gynecology

## 2016-12-12 ENCOUNTER — Encounter (HOSPITAL_COMMUNITY): Payer: Self-pay

## 2016-12-12 DIAGNOSIS — Z01812 Encounter for preprocedural laboratory examination: Secondary | ICD-10-CM | POA: Diagnosis not present

## 2016-12-12 LAB — BASIC METABOLIC PANEL
Anion gap: 8 (ref 5–15)
BUN: 13 mg/dL (ref 6–20)
CO2: 23 mmol/L (ref 22–32)
Calcium: 9.3 mg/dL (ref 8.9–10.3)
Chloride: 103 mmol/L (ref 101–111)
Creatinine, Ser: 0.73 mg/dL (ref 0.44–1.00)
GFR calc Af Amer: >60 mL/min (ref >60)
GFR calc non Af Amer: >60 mL/min (ref >60)
Glucose, Bld: 193 mg/dL — ABNORMAL HIGH (ref 65–99)
Potassium: 3.9 mmol/L (ref 3.5–5.1)
Sodium: 134 mmol/L — ABNORMAL LOW (ref 135–145)

## 2016-12-12 LAB — CBC
HCT: 35.4 % — ABNORMAL LOW (ref 36.0–46.0)
Hemoglobin: 11.6 g/dL — ABNORMAL LOW (ref 12.0–15.0)
MCH: 26.4 pg (ref 26.0–34.0)
MCHC: 32.8 g/dL (ref 30.0–36.0)
MCV: 80.6 fL (ref 78.0–100.0)
Platelets: 273 10*3/uL (ref 150–400)
RBC: 4.39 MIL/uL (ref 3.87–5.11)
RDW: 14.9 % (ref 11.5–15.5)
WBC: 7.7 10*3/uL (ref 4.0–10.5)

## 2016-12-12 LAB — TYPE AND SCREEN
ABO/RH(D): A POS
Antibody Screen: NEGATIVE

## 2016-12-12 LAB — ABO/RH: ABO/RH(D): A POS

## 2016-12-13 ENCOUNTER — Other Ambulatory Visit: Payer: Self-pay | Admitting: General Practice

## 2016-12-14 LAB — HEMOGLOBIN A1C
Hgb A1c MFr Bld: 7.6 % — ABNORMAL HIGH (ref 4.8–5.6)
Mean Plasma Glucose: 171 mg/dL

## 2016-12-20 ENCOUNTER — Ambulatory Visit (HOSPITAL_COMMUNITY): Payer: BLUE CROSS/BLUE SHIELD | Admitting: Anesthesiology

## 2016-12-20 ENCOUNTER — Encounter (HOSPITAL_COMMUNITY): Payer: Self-pay | Admitting: *Deleted

## 2016-12-20 ENCOUNTER — Observation Stay (HOSPITAL_COMMUNITY)
Admission: RE | Admit: 2016-12-20 | Discharge: 2016-12-21 | Disposition: A | Discharge status: Home / Self Care | Payer: BLUE CROSS/BLUE SHIELD | Source: Ambulatory Visit | Attending: Obstetrics & Gynecology | Admitting: Obstetrics & Gynecology

## 2016-12-20 ENCOUNTER — Encounter (HOSPITAL_COMMUNITY)
Admission: RE | Disposition: A | Discharge status: Home / Self Care | Payer: Self-pay | Source: Ambulatory Visit | Attending: Obstetrics & Gynecology

## 2016-12-20 DIAGNOSIS — N800 Endometriosis of uterus: Secondary | ICD-10-CM | POA: Insufficient documentation

## 2016-12-20 DIAGNOSIS — E119 Type 2 diabetes mellitus without complications: Secondary | ICD-10-CM | POA: Insufficient documentation

## 2016-12-20 DIAGNOSIS — Z9889 Other specified postprocedural states: Secondary | ICD-10-CM

## 2016-12-20 DIAGNOSIS — N838 Other noninflammatory disorders of ovary, fallopian tube and broad ligament: Secondary | ICD-10-CM | POA: Insufficient documentation

## 2016-12-20 DIAGNOSIS — R102 Pelvic and perineal pain unspecified side: Secondary | ICD-10-CM

## 2016-12-20 DIAGNOSIS — Z7984 Long term (current) use of oral hypoglycemic drugs: Secondary | ICD-10-CM | POA: Insufficient documentation

## 2016-12-20 DIAGNOSIS — D252 Subserosal leiomyoma of uterus: Secondary | ICD-10-CM

## 2016-12-20 DIAGNOSIS — D251 Intramural leiomyoma of uterus: Secondary | ICD-10-CM | POA: Diagnosis not present

## 2016-12-20 DIAGNOSIS — E0800 Diabetes mellitus due to underlying condition with hyperosmolarity without nonketotic hyperglycemic-hyperosmolar coma (NKHHC): Secondary | ICD-10-CM

## 2016-12-20 DIAGNOSIS — D250 Submucous leiomyoma of uterus: Secondary | ICD-10-CM

## 2016-12-20 DIAGNOSIS — N938 Other specified abnormal uterine and vaginal bleeding: Secondary | ICD-10-CM | POA: Diagnosis not present

## 2016-12-20 DIAGNOSIS — D259 Leiomyoma of uterus, unspecified: Secondary | ICD-10-CM | POA: Diagnosis not present

## 2016-12-20 DIAGNOSIS — Z794 Long term (current) use of insulin: Secondary | ICD-10-CM

## 2016-12-20 DIAGNOSIS — D219 Benign neoplasm of connective and other soft tissue, unspecified: Secondary | ICD-10-CM

## 2016-12-20 HISTORY — PX: ROBOTIC ASSISTED TOTAL HYSTERECTOMY WITH BILATERAL SALPINGO OOPHERECTOMY: SHX6086

## 2016-12-20 HISTORY — PX: CYSTOSCOPY: SHX5120

## 2016-12-20 LAB — GLUCOSE, CAPILLARY
Glucose-Capillary: 94 mg/dL (ref 65–99)
Glucose-Capillary: 149 mg/dL — ABNORMAL HIGH (ref 65–99)
Glucose-Capillary: 161 mg/dL — ABNORMAL HIGH (ref 65–99)
Glucose-Capillary: 179 mg/dL — ABNORMAL HIGH (ref 65–99)
Glucose-Capillary: 159 mg/dL — ABNORMAL HIGH (ref 65–99)
Glucose-Capillary: 121 mg/dL — ABNORMAL HIGH (ref 65–99)

## 2016-12-20 LAB — PREGNANCY, URINE: Preg Test, Ur: NEGATIVE

## 2016-12-20 SURGERY — ROBOTIC ASSISTED TOTAL HYSTERECTOMY WITH BILATERAL SALPINGO OOPHORECTOMY
Anesthesia: General | Site: Bladder

## 2016-12-20 MED ORDER — HYDROMORPHONE HCL 1 MG/ML IJ SOLN
INTRAMUSCULAR | Status: AC
  Filled 2016-12-20: qty 1

## 2016-12-20 MED ORDER — ONDANSETRON HCL 4 MG/2ML IJ SOLN: 4.0000 mg | Freq: Four times a day (QID) | INTRAMUSCULAR | Status: DC | PRN

## 2016-12-20 MED ORDER — METOCLOPRAMIDE HCL 5 MG/ML IJ SOLN
INTRAMUSCULAR | Status: AC
  Filled 2016-12-20: qty 2

## 2016-12-20 MED ORDER — BUPIVACAINE HCL (PF) 0.5 % IJ SOLN
INTRAMUSCULAR | Status: AC
  Filled 2016-12-20: qty 30

## 2016-12-20 MED ORDER — STERILE WATER FOR IRRIGATION IR SOLN
Status: DC | PRN
  Administered 2016-12-20: 1000 mL via INTRAVESICAL

## 2016-12-20 MED ORDER — FENTANYL CITRATE (PF) 250 MCG/5ML IJ SOLN
INTRAMUSCULAR | Status: AC
  Filled 2016-12-20: qty 5

## 2016-12-20 MED ORDER — ONDANSETRON HCL 4 MG PO TABS: 4.0000 mg | ORAL_TABLET | Freq: Four times a day (QID) | ORAL | Status: DC | PRN

## 2016-12-20 MED ORDER — CEFAZOLIN SODIUM-DEXTROSE 2-4 GM/100ML-% IV SOLN
2.0000 g | INTRAVENOUS | Status: AC
  Administered 2016-12-20: 2 g via INTRAVENOUS

## 2016-12-20 MED ORDER — ONDANSETRON HCL 4 MG/2ML IJ SOLN
INTRAMUSCULAR | Status: AC
  Filled 2016-12-20: qty 2

## 2016-12-20 MED ORDER — LIDOCAINE HCL (CARDIAC) 20 MG/ML IV SOLN
INTRAVENOUS | Status: DC | PRN
  Administered 2016-12-20: 80 mg via INTRAVENOUS

## 2016-12-20 MED ORDER — SUGAMMADEX SODIUM 200 MG/2ML IV SOLN
INTRAVENOUS | Status: AC
  Filled 2016-12-20: qty 2

## 2016-12-20 MED ORDER — FENTANYL CITRATE (PF) 100 MCG/2ML IJ SOLN
INTRAMUSCULAR | Status: DC | PRN
  Administered 2016-12-20: 100 ug via INTRAVENOUS
  Administered 2016-12-20: 150 ug via INTRAVENOUS
  Administered 2016-12-20: 100 ug via INTRAVENOUS

## 2016-12-20 MED ORDER — PANTOPRAZOLE SODIUM 40 MG PO TBEC
40.0000 mg | DELAYED_RELEASE_TABLET | Freq: Every day | ORAL | Status: DC
  Administered 2016-12-21: 40 mg via ORAL
  Filled 2016-12-20: qty 1

## 2016-12-20 MED ORDER — METOCLOPRAMIDE HCL 5 MG/ML IJ SOLN
INTRAMUSCULAR | Status: DC | PRN
  Administered 2016-12-20: 10 mg via INTRAVENOUS

## 2016-12-20 MED ORDER — METFORMIN HCL ER 500 MG PO TB24
1000.0000 mg | ORAL_TABLET | Freq: Every day | ORAL | Status: DC
  Administered 2016-12-21: 1000 mg via ORAL
  Filled 2016-12-20: qty 2

## 2016-12-20 MED ORDER — ROCURONIUM BROMIDE 100 MG/10ML IV SOLN
INTRAVENOUS | Status: DC | PRN
  Administered 2016-12-20: 50 mg via INTRAVENOUS
  Administered 2016-12-20: 20 mg via INTRAVENOUS

## 2016-12-20 MED ORDER — ROCURONIUM BROMIDE 100 MG/10ML IV SOLN
INTRAVENOUS | Status: AC
  Filled 2016-12-20: qty 1

## 2016-12-20 MED ORDER — OXYCODONE HCL 5 MG PO TABS: 5.0000 mg | ORAL_TABLET | Freq: Once | ORAL | Status: DC | PRN

## 2016-12-20 MED ORDER — SUGAMMADEX SODIUM 200 MG/2ML IV SOLN
INTRAVENOUS | Status: DC | PRN
  Administered 2016-12-20: 200 mg via INTRAVENOUS

## 2016-12-20 MED ORDER — KETOROLAC TROMETHAMINE 30 MG/ML IJ SOLN
30.0000 mg | Freq: Four times a day (QID) | INTRAMUSCULAR | Status: DC
  Administered 2016-12-20 – 2016-12-21 (×3): 30 mg via INTRAVENOUS
  Filled 2016-12-20 (×3): qty 1

## 2016-12-20 MED ORDER — PROPOFOL 10 MG/ML IV BOLUS
INTRAVENOUS | Status: DC | PRN
  Administered 2016-12-20: 200 mg via INTRAVENOUS

## 2016-12-20 MED ORDER — ONDANSETRON HCL 4 MG/2ML IJ SOLN
4.0000 mg | Freq: Four times a day (QID) | INTRAMUSCULAR | Status: DC | PRN
Start: 2016-12-20 — End: 2016-12-20

## 2016-12-20 MED ORDER — PROPOFOL 10 MG/ML IV BOLUS
INTRAVENOUS | Status: AC
  Filled 2016-12-20: qty 20

## 2016-12-20 MED ORDER — SODIUM CHLORIDE 0.9 % IR SOLN
Status: DC | PRN
Start: 2016-12-20 — End: 2016-12-20
  Administered 2016-12-20: 3000 mL

## 2016-12-20 MED ORDER — SCOPOLAMINE 1 MG/3DAYS TD PT72
1.0000 | MEDICATED_PATCH | Freq: Once | TRANSDERMAL | Status: DC
  Administered 2016-12-20: 1.5 mg via TRANSDERMAL

## 2016-12-20 MED ORDER — MIDAZOLAM HCL 2 MG/2ML IJ SOLN
INTRAMUSCULAR | Status: AC
  Filled 2016-12-20: qty 2

## 2016-12-20 MED ORDER — DOCUSATE SODIUM 100 MG PO CAPS
100.0000 mg | ORAL_CAPSULE | Freq: Two times a day (BID) | ORAL | Status: DC
  Administered 2016-12-20 – 2016-12-21 (×2): 100 mg via ORAL
  Filled 2016-12-20 (×2): qty 1

## 2016-12-20 MED ORDER — OXYCODONE-ACETAMINOPHEN 5-325 MG PO TABS: 1.0000 | ORAL_TABLET | ORAL | Status: DC | PRN

## 2016-12-20 MED ORDER — MAGNESIUM CITRATE PO SOLN
1.0000 | Freq: Once | ORAL | Status: DC | PRN
  Filled 2016-12-20: qty 296

## 2016-12-20 MED ORDER — IBUPROFEN 600 MG PO TABS: 600.0000 mg | ORAL_TABLET | Freq: Four times a day (QID) | ORAL | Status: DC | PRN

## 2016-12-20 MED ORDER — SIMETHICONE 80 MG PO CHEW: 80.0000 mg | CHEWABLE_TABLET | Freq: Four times a day (QID) | ORAL | Status: DC | PRN

## 2016-12-20 MED ORDER — ONDANSETRON HCL 4 MG/2ML IJ SOLN
INTRAMUSCULAR | Status: DC | PRN
  Administered 2016-12-20: 4 mg via INTRAVENOUS

## 2016-12-20 MED ORDER — METHYLENE BLUE 0.5 % INJ SOLN
INTRAVENOUS | Status: DC | PRN
  Administered 2016-12-20: 50 mg via INTRAVENOUS

## 2016-12-20 MED ORDER — OXYCODONE HCL 5 MG/5ML PO SOLN: 5.0000 mg | Freq: Once | ORAL | Status: DC | PRN

## 2016-12-20 MED ORDER — HYDROMORPHONE HCL 1 MG/ML IJ SOLN
INTRAMUSCULAR | Status: DC | PRN
  Administered 2016-12-20: 1 mg via INTRAVENOUS

## 2016-12-20 MED ORDER — DEXTROSE-NACL 5-0.45 % IV SOLN: INTRAVENOUS | Status: AC

## 2016-12-20 MED ORDER — POLYETHYLENE GLYCOL 3350 17 G PO PACK: 17.0000 g | PACK | Freq: Every day | ORAL | Status: DC | PRN

## 2016-12-20 MED ORDER — BISACODYL 10 MG RE SUPP
10.0000 mg | Freq: Every day | RECTAL | Status: DC | PRN
  Filled 2016-12-20: qty 1

## 2016-12-20 MED ORDER — DEXTROSE-NACL 5-0.45 % IV SOLN
INTRAVENOUS | Status: DC
  Administered 2016-12-20: 15:00:00 via INTRAVENOUS

## 2016-12-20 MED ORDER — KETOROLAC TROMETHAMINE 30 MG/ML IJ SOLN
INTRAMUSCULAR | Status: AC
  Filled 2016-12-20: qty 1

## 2016-12-20 MED ORDER — MIDAZOLAM HCL 2 MG/2ML IJ SOLN
INTRAMUSCULAR | Status: DC | PRN
  Administered 2016-12-20: 2 mg via INTRAVENOUS

## 2016-12-20 MED ORDER — SCOPOLAMINE 1 MG/3DAYS TD PT72
MEDICATED_PATCH | TRANSDERMAL | Status: AC
  Administered 2016-12-20: 1.5 mg via TRANSDERMAL
  Filled 2016-12-20: qty 1

## 2016-12-20 MED ORDER — KETOROLAC TROMETHAMINE 30 MG/ML IJ SOLN
INTRAMUSCULAR | Status: DC | PRN
  Administered 2016-12-20: 30 mg via INTRAVENOUS

## 2016-12-20 MED ORDER — HYDROMORPHONE HCL 1 MG/ML IJ SOLN
0.2500 mg | INTRAMUSCULAR | Status: DC | PRN
Start: 2016-12-20 — End: 2016-12-20
  Administered 2016-12-20: 0.5 mg via INTRAVENOUS

## 2016-12-20 MED ORDER — KETOROLAC TROMETHAMINE 30 MG/ML IJ SOLN: 30.0000 mg | Freq: Four times a day (QID) | INTRAMUSCULAR | Status: DC

## 2016-12-20 MED ORDER — LINAGLIPTIN 5 MG PO TABS
5.0000 mg | ORAL_TABLET | Freq: Every day | ORAL | Status: DC
  Administered 2016-12-21: 5 mg via ORAL
  Filled 2016-12-20: qty 1

## 2016-12-20 MED ORDER — SITAGLIP PHOS-METFORMIN HCL ER 50-1000 MG PO TB24: 1.0000 | ORAL_TABLET | Freq: Every day | ORAL | Status: DC

## 2016-12-20 MED ORDER — HYDROMORPHONE HCL 1 MG/ML IJ SOLN
0.2000 mg | INTRAMUSCULAR | Status: DC | PRN
  Administered 2016-12-20: 0.6 mg via INTRAVENOUS
  Filled 2016-12-20: qty 1

## 2016-12-20 MED ORDER — LACTATED RINGERS IV SOLN
INTRAVENOUS | Status: DC
  Administered 2016-12-20: 09:00:00 via INTRAVENOUS
  Administered 2016-12-20: 125 mL/h via INTRAVENOUS
  Administered 2016-12-20: 07:00:00 via INTRAVENOUS

## 2016-12-20 MED ORDER — GLIMEPIRIDE 4 MG PO TABS
4.0000 mg | ORAL_TABLET | Freq: Two times a day (BID) | ORAL | Status: DC
  Administered 2016-12-20 – 2016-12-21 (×2): 4 mg via ORAL
  Filled 2016-12-20 (×2): qty 1

## 2016-12-20 MED ORDER — BUPIVACAINE HCL 0.5 % IJ SOLN
INTRAMUSCULAR | Status: DC | PRN
  Administered 2016-12-20: 30 mL

## 2016-12-20 MED ORDER — LIDOCAINE HCL (CARDIAC) 20 MG/ML IV SOLN
INTRAVENOUS | Status: AC
  Filled 2016-12-20: qty 5

## 2016-12-20 MED ORDER — METHYLENE BLUE 0.5 % INJ SOLN
INTRAVENOUS | Status: AC
  Filled 2016-12-20: qty 10

## 2016-12-20 SURGICAL SUPPLY — 73 items
ADH SKN CLS APL DERMABOND .7 (GAUZE/BANDAGES/DRESSINGS) ×2
APL SRG 38 LTWT LNG FL B (MISCELLANEOUS) ×2
APPLICATOR ARISTA FLEXITIP XL (MISCELLANEOUS) ×1 IMPLANT
APPLIER CLIP 5 13 M/L LIGAMAX5 (MISCELLANEOUS)
APR CLP MED LRG 5 ANG JAW (MISCELLANEOUS)
BARRIER ADHS 3X4 INTERCEED (GAUZE/BANDAGES/DRESSINGS) IMPLANT
BRR ADH 4X3 ABS CNTRL BYND (GAUZE/BANDAGES/DRESSINGS)
CATH FOLEY 3WAY  5CC 16FR (CATHETERS) ×1
CATH FOLEY 3WAY 5CC 16FR (CATHETERS) ×2 IMPLANT
CLIP APPLIE 5 13 M/L LIGAMAX5 (MISCELLANEOUS) IMPLANT
CLOTH BEACON ORANGE TIMEOUT ST (SAFETY) ×3 IMPLANT
CONT PATH 16OZ SNAP LID 3702 (MISCELLANEOUS) ×3 IMPLANT
COVER BACK TABLE 60X90IN (DRAPES) ×6 IMPLANT
COVER TIP SHEARS 8 DVNC (MISCELLANEOUS) ×2 IMPLANT
COVER TIP SHEARS 8MM DA VINCI (MISCELLANEOUS) ×1
DECANTER SPIKE VIAL GLASS SM (MISCELLANEOUS) ×3 IMPLANT
DEFOGGER SCOPE WARMER CLEARIFY (MISCELLANEOUS) ×3 IMPLANT
DERMABOND ADVANCED (GAUZE/BANDAGES/DRESSINGS) ×1
DERMABOND ADVANCED .7 DNX12 (GAUZE/BANDAGES/DRESSINGS) ×2 IMPLANT
DRAPE ROBOTICS STRL (DRAPES) ×1 IMPLANT
DRAPE UNDERBUTTOCKS STRL (DRAPE) ×1 IMPLANT
DRSG OPSITE POSTOP 3X4 (GAUZE/BANDAGES/DRESSINGS) ×3 IMPLANT
DURAPREP 26ML APPLICATOR (WOUND CARE) ×3 IMPLANT
ELECT REM PT RETURN 9FT ADLT (ELECTROSURGICAL) ×3
ELECTRODE REM PT RTRN 9FT ADLT (ELECTROSURGICAL) ×2 IMPLANT
GAUZE VASELINE 3X9 (GAUZE/BANDAGES/DRESSINGS) IMPLANT
GLOVE BIO SURGEON STRL SZ7 (GLOVE) ×6 IMPLANT
GLOVE BIOGEL PI IND STRL 7.0 (GLOVE) ×10 IMPLANT
GLOVE BIOGEL PI INDICATOR 7.0 (GLOVE) ×5
GYRUS RUMI II 2.5CM BLUE (DISPOSABLE) ×3
GYRUS RUMI II 3.5CM BLUE (DISPOSABLE)
GYRUS RUMI II 4.0CM BLUE (DISPOSABLE)
HEMOSTAT ARISTA ABSORB 3G PWDR (MISCELLANEOUS) ×1 IMPLANT
KIT ACCESSORY DA VINCI DISP (KITS) ×1
KIT ACCESSORY DVNC DISP (KITS) ×2 IMPLANT
LEGGING LITHOTOMY PAIR STRL (DRAPES) ×3 IMPLANT
NEEDLE INSUFFLATION 120MM (ENDOMECHANICALS) ×3 IMPLANT
OCCLUDER COLPOPNEUMO (BALLOONS) ×3 IMPLANT
PACK ROBOT WH (CUSTOM PROCEDURE TRAY) ×3 IMPLANT
PACK ROBOTIC GOWN (GOWN DISPOSABLE) ×3 IMPLANT
PACK TRENDGUARD 450 HYBRID PRO (MISCELLANEOUS) IMPLANT
PACK TRENDGUARD 600 HYBRD PROC (MISCELLANEOUS) IMPLANT
PAD PREP 24X48 CUFFED NSTRL (MISCELLANEOUS) ×3 IMPLANT
PROTECTOR NERVE ULNAR (MISCELLANEOUS) ×6 IMPLANT
RUMI II 3.0CM BLUE KOH-EFFICIE (DISPOSABLE) ×1 IMPLANT
RUMI II GYRUS 2.5CM BLUE (DISPOSABLE) IMPLANT
RUMI II GYRUS 3.5CM BLUE (DISPOSABLE) IMPLANT
RUMI II GYRUS 4.0CM BLUE (DISPOSABLE) IMPLANT
SEALER ENDOWRIST ONE VESSEL (MISCELLANEOUS) ×3 IMPLANT
SET CYSTO W/LG BORE CLAMP LF (SET/KITS/TRAYS/PACK) ×1 IMPLANT
SET IRRIG TUBING LAPAROSCOPIC (IRRIGATION / IRRIGATOR) ×3 IMPLANT
SET TRI-LUMEN FLTR TB AIRSEAL (TUBING) IMPLANT
SUT VIC AB 0 CT1 27 (SUTURE) ×6
SUT VIC AB 0 CT1 27XBRD ANBCTR (SUTURE) ×4 IMPLANT
SUT VICRYL 0 UR6 27IN ABS (SUTURE) ×6 IMPLANT
SUT VICRYL 4-0 PS2 18IN ABS (SUTURE) ×6 IMPLANT
SUT VLOC 180 0 9IN  GS21 (SUTURE) ×1
SUT VLOC 180 0 9IN GS21 (SUTURE) ×2 IMPLANT
SYSTEM CARTER THOMASON II (TROCAR) IMPLANT
TIP RUMI ORANGE 6.7MMX12CM (TIP) IMPLANT
TIP UTERINE 5.1X6CM LAV DISP (MISCELLANEOUS) IMPLANT
TIP UTERINE 6.7X10CM GRN DISP (MISCELLANEOUS) IMPLANT
TIP UTERINE 6.7X6CM WHT DISP (MISCELLANEOUS) IMPLANT
TIP UTERINE 6.7X8CM BLUE DISP (MISCELLANEOUS) ×1 IMPLANT
TOWEL OR 17X24 6PK STRL BLUE (TOWEL DISPOSABLE) ×6 IMPLANT
TRENDGUARD 450 HYBRID PRO PACK (MISCELLANEOUS) ×3
TRENDGUARD 600 HYBRID PROC PK (MISCELLANEOUS)
TROCAR 12M 150ML BLUNT (TROCAR) ×1 IMPLANT
TROCAR DILATING TIP 12MM 150MM (ENDOMECHANICALS) ×1 IMPLANT
TROCAR DISP BLADELESS 8 DVNC (TROCAR) ×2 IMPLANT
TROCAR DISP BLADELESS 8MM (TROCAR) ×1
TROCAR PORT AIRSEAL 5X120 (TROCAR) IMPLANT
WATER STERILE IRR 1000ML POUR (IV SOLUTION) ×3 IMPLANT

## 2016-12-20 NOTE — Anesthesia Postprocedure Evaluation (Signed)
Anesthesia Post Note  Patient: Industrial/product designer  Procedure(s) Performed: Procedure(s) (LRB): ROBOTIC ASSISTED TOTAL HYSTERECTOMY WITH BILATERAL SALPINGO OOPHORECTOMY (Bilateral) CYSTOSCOPY (N/A)  Patient location during evaluation: [facility] Anesthesia Type: General Level of consciousness: awake and alert and oriented Pain management: pain level controlled Vital Signs Assessment: post-procedure vital signs reviewed and stable Respiratory status: spontaneous breathing, nonlabored ventilation, respiratory function stable and patient connected to nasal cannula oxygen Cardiovascular status: blood pressure returned to baseline Postop Assessment: no signs of nausea or vomiting and adequate PO intake Anesthetic complications: no        Last Vitals:  Vitals:   12/20/16 1140 12/20/16 1240  BP: 101/62 (!) 93/58  Pulse: 86 89  Resp: 16 16  Temp: 36.9 C 36.7 C    Last Pain:  Vitals:   12/20/16 1433  TempSrc:   PainSc: 7    Pain Goal: Patients Stated Pain Goal: 3 (12/20/16 9233)               Willa Rough

## 2016-12-20 NOTE — Anesthesia Procedure Notes (Signed)
Procedure Name: Intubation Date/Time: 12/20/2016 7:24 AM Performed by: Jonna Munro Pre-anesthesia Checklist: Patient identified, Emergency Drugs available, Suction available, Timeout performed and Patient being monitored Patient Re-evaluated:Patient Re-evaluated prior to inductionOxygen Delivery Method: Circle system utilized Preoxygenation: Pre-oxygenation with 100% oxygen Intubation Type: IV induction Ventilation: Mask ventilation without difficulty Laryngoscope Size: Mac and 3 Grade View: Grade I Tube type: Oral Number of attempts: 1 Airway Equipment and Method: Stylet Placement Confirmation: ETT inserted through vocal cords under direct vision,  positive ETCO2 and breath sounds checked- equal and bilateral Secured at: 22 cm Tube secured with: Tape Dental Injury: Teeth and Oropharynx as per pre-operative assessment

## 2016-12-20 NOTE — Anesthesia Postprocedure Evaluation (Signed)
Anesthesia Post Note  Patient: Industrial/product designer  Procedure(s) Performed: Procedure(s) (LRB): ROBOTIC ASSISTED TOTAL HYSTERECTOMY WITH BILATERAL SALPINGO OOPHORECTOMY (Bilateral)  Patient location during evaluation: PACU Anesthesia Type: General Level of consciousness: awake and alert and patient cooperative Pain management: pain level controlled Vital Signs Assessment: post-procedure vital signs reviewed and stable Respiratory status: spontaneous breathing and respiratory function stable Cardiovascular status: stable Anesthetic complications: no        Last Vitals:  Vitals:   12/20/16 1115 12/20/16 1140  BP: 106/73 101/62  Pulse: 78 86  Resp: 16 16  Temp: 36.6 C 36.9 C    Last Pain:  Vitals:   12/20/16 1140  TempSrc:   PainSc: 3    Pain Goal: Patients Stated Pain Goal: 3 (12/20/16 2411)               Amori Colomb S

## 2016-12-20 NOTE — Transfer of Care (Signed)
Immediate Anesthesia Transfer of Care Note  Patient: Robin Kennedy  Procedure(s) Performed: Procedure(s): ROBOTIC ASSISTED TOTAL HYSTERECTOMY WITH BILATERAL SALPINGO OOPHORECTOMY (Bilateral)  Patient Location: PACU  Anesthesia Type:General  Level of Consciousness: awake, alert  and oriented  Airway & Oxygen Therapy: Patient Spontanous Breathing and Patient connected to nasal cannula oxygen  Post-op Assessment: Report given to RN and Post -op Vital signs reviewed and stable  Post vital signs: Reviewed and stable  Last Vitals:  Vitals:   12/20/16 0608  BP: 110/79  Pulse: 88  Resp: 18  Temp: 36.9 C    Last Pain:  Vitals:   12/20/16 0608  TempSrc: Oral      Patients Stated Pain Goal: 3 (59/74/71 8550)  Complications: No apparent anesthesia complications

## 2016-12-20 NOTE — Op Note (Addendum)
12/20/2016  10:07 AM  PATIENT:  Robin Kennedy  41 y.o. female  PRE-OPERATIVE DIAGNOSIS:  Pelvic Pain DUB  POST-OPERATIVE DIAGNOSIS:  PELVIC PAIN,DYSFUNCTIONAL UTERINE BLEEDING  PROCEDURE:  Procedure(s): ROBOTIC ASSISTED TOTAL HYSTERECTOMY WITH BILATERAL SALPINGO OOPHORECTOMY (Bilateral); cystoscopy  SURGEON:  Surgeon(s) and Role:    * Lavonia Drafts, MD - Primary    * Anyanwu, Sallyanne Havers, MD - Assisting  ANESTHESIA:   general  EBL:  Total I/O In: 1300 [I.V.:1300] Out: 600 [Urine:400; Blood:200]  BLOOD ADMINISTERED:none  DRAINS: none   LOCAL MEDICATIONS USED:  MARCAINE     SPECIMEN:  Source of Specimen:  uterus and fallopian tubes  DISPOSITION OF SPECIMEN:  PATHOLOGY  COUNTS:  YES  DICTATION: .Note written in EPIC  PLAN OF CARE: Admit for overnight observation  PATIENT DISPOSITION:  PACU - hemodynamically stable.   Delay start of Pharmacological VTE agent (>24hrs) due to surgical blood loss or risk of bleeding: yes  Complications : none immediate  The risks, benefits, and alternatives of surgery were explained, understood, and accepted. Consents were signed. All questions were answered. She was taken to the operating room and general anesthesia was applied without complication. She was placed in the dorsal lithotomy position and her abdomen and vagina were prepped and draped after she had been carefully positioned on the table. A bimanual exam revealed a 12 week size uterus that was mobile. Her adnexa were not enlarged. The cervix was measured and the uterus was sounded to 8 cm. A Rumi uterine manipulator was placed without difficulty. A Foley catheter was placed and it drained clear throughout the case. Gloves were changed and attention was turned to the abdomen where a 47mm incision was made in the umbilicus and a Veress needle was placed intraperitoneally. CO2 was used to insufflate the abdomen to approximately 4 L. After good pneumoperitoneum was  established, a 12 mm incision was made about 4 cm above the umbilicus. A trocar was placed. Laparoscopy confirmed correct placement. She was placed in Trendelenburg position and ports were placed in appropriate positions on her abdomen to allow maximum exposure during the robotic case. Specifically there was an 71mm assistant port placed in the left upper quadrant under direct laproscopic visualization. Two 8 mm ports were placed 8cm lateral to the midline port.  These were all placed under direct laparoscopic visualization. The robot was docked and I proceeded with the robotic portion of the case.  The pelvis was inspected and the uterus was found to have fibroids and be enlarged.  The fallopian tubes and ovaries were found to be normal. The remainder of her pelvis appeared normal. The ureters and the infundibulopelvic ligaments were identified. I excised the fallopian tubes bilaterally. The round ligament on each side was cauterized and cut. The Vessel Sealer instrument was used for this portion. The round ligaments were identified, cauterized and ligated, a bladder flap was created anteriorly. The uterine vessels were identified and cauterized and then cut.The bladder was pushed out of the operative site and an anterior colpotomy was made. The colpotomy incision was extended circumferentially, following the blue outline of the Rumi manipulator. All pedicles were hemostatic.  The uterus was removed from the vagina with the fallopian tube segments. The vaginal cuff was closed with v-lock suture.  Excellent hemostasis was noted throughout. The pelvis was irrigated. At this point I performed cystoscopy. The cystoscopy revealed blue ejection from both ureters. After determining excellent hemostasis, the robot was undocked.   The midline fascial incision was closed with  0 vicryl.  The skin from all of the other ports was closed with 3-0 vicryl. 30cc of 0.5% Marcaine was injected into the port sites.  The patient was  then extubated and taken to recovery in stable condition.   Sponge, lap and needle counts were correct x 2.   Malashia Kamaka L. Harraway-Smith, M.D., [name]

## 2016-12-20 NOTE — Addendum Note (Signed)
Addendum  created 12/20/16 1559 by Jonna Munro, CRNA   Anesthesia Intra Flowsheets edited, Sign clinical note

## 2016-12-20 NOTE — Brief Op Note (Addendum)
12/20/2016  10:07 AM  PATIENT:  Robin Kennedy  41 y.o. female  PRE-OPERATIVE DIAGNOSIS:  Pelvic Pain DUB  POST-OPERATIVE DIAGNOSIS:  PELVIC PAIN,DYSFUNCTIONAL UTERINE BLEEDING  PROCEDURE:  Procedure(s): ROBOTIC ASSISTED TOTAL HYSTERECTOMY WITH BILATERAL SALPINGO OOPHORECTOMY (Bilateral);cystoscopy  SURGEON:  Surgeon(s) and Role:    * Lavonia Drafts, MD - Primary    * Anyanwu, Sallyanne Havers, MD - Assisting  ANESTHESIA:   general  EBL:  Total I/O In: 1300 [I.V.:1300] Out: 600 [Urine:400; Blood:200]  BLOOD ADMINISTERED:none  DRAINS: none   LOCAL MEDICATIONS USED:  MARCAINE     SPECIMEN:  Source of Specimen:  uterus and fallopian tubes  DISPOSITION OF SPECIMEN:  PATHOLOGY  COUNTS:  YES  TOURNIQUET:  * No tourniquets in log *  DICTATION: .Note written in EPIC  PLAN OF CARE: Admit for overnight observation  PATIENT DISPOSITION:  PACU - hemodynamically stable.   Delay start of Pharmacological VTE agent (>24hrs) due to surgical blood loss or risk of bleeding: yes  Complications : none immediate  Tionna Gigante L. Harraway-Smith, M.D., [name]

## 2016-12-20 NOTE — Anesthesia Preprocedure Evaluation (Signed)
Anesthesia Evaluation  Patient identified by MRN, date of birth, ID band Patient awake    Reviewed: Allergy & Precautions, H&P , NPO status , Patient's Chart, lab work & pertinent test results  Airway Mallampati: II   Neck ROM: full    Dental   Pulmonary    breath sounds clear to auscultation       Cardiovascular negative cardio ROS   Rhythm:regular Rate:Normal     Neuro/Psych    GI/Hepatic   Endo/Other  diabetes, Type 2  Renal/GU      Musculoskeletal   Abdominal   Peds  Hematology   Anesthesia Other Findings   Reproductive/Obstetrics                             Anesthesia Physical Anesthesia Plan  ASA: II  Anesthesia Plan: General   Post-op Pain Management:    Induction: Intravenous  Airway Management Planned: Oral ETT  Additional Equipment:   Intra-op Plan:   Post-operative Plan: Extubation in OR  Informed Consent: I have reviewed the patients History and Physical, chart, labs and discussed the procedure including the risks, benefits and alternatives for the proposed anesthesia with the patient or authorized representative who has indicated his/her understanding and acceptance.     Plan Discussed with: CRNA, Anesthesiologist and Surgeon  Anesthesia Plan Comments:         Anesthesia Quick Evaluation

## 2016-12-20 NOTE — H&P (Signed)
Preoperative History and Physical  Lenah Messenger is a 41 y.o. [phone] here for surgical management of uterine fibroids, menorrhagia and pelvic pain   Proposed surgery: Robot assisted total laparoscopic hysterectomy with bilateral salpingectomy  Past Medical History:  Diagnosis Date  . Diabetes mellitus without complication ([place])   . SVD (spontaneous vaginal delivery)    X 3   Past Surgical History:  Procedure Laterality Date  . TUBAL LIGATION    . WISDOM TOOTH EXTRACTION     OB History    Gravida Para Term Preterm AB Living   4 3 3  0 1 3   SAB TAB Ectopic Multiple Live Births   0 1 0   3     Patient denies any cervical dysplasia or STIs. Prescriptions Prior to Admission  Medication Sig Dispense Refill Last Dose  . glimepiride (AMARYL) 4 MG tablet Take 4 mg by mouth 2 (two) times daily.  6 12/19/2016 at 900  . glucose blood (ONE TOUCH ULTRA TEST) test strip Use to check cbgs qd 100 each 11 12/19/2016 at Unknown time  . JANUMET XR 50-1000 MG TB24 Take 1 tablet by mouth daily.  12 12/19/2016 at Unknown time    No Known Allergies Social History:   reports that she has never smoked. She has never used smokeless tobacco. She reports that she does not drink alcohol or use drugs. Family History  Problem Relation Age of Onset  . Diabetes Brother   . Asthma Daughter   . GER disease Daughter   . Stroke Paternal Grandmother   . Hyperlipidemia Maternal Aunt   . Stroke Maternal Uncle     Review of Systems: Noncontributory  PHYSICAL EXAM: Blood pressure 110/79, pulse 88, temperature 98.4 F (36.9 C), temperature source Oral, resp. rate 18, SpO2 97 %. General appearance - alert, well appearing, and in no distress Chest - clear to auscultation, no wheezes, rales or rhonchi, symmetric air entry Heart - normal rate and regular rhythm Abdomen - soft, nontender, nondistended, no masses or organomegaly Pelvic - examination not indicated Extremities - peripheral pulses normal, no  pedal edema, no clubbing or cyanosis  Labs: Results for orders placed or performed during the hospital encounter of 12/20/16 (from the past 336 hour(s))  Pregnancy, urine   Collection Time: 12/20/16  6:00 AM  Result Value Ref Range   Preg Test, Ur NEGATIVE NEGATIVE  Results for orders placed or performed in visit on 12/13/16 (from the past 336 hour(s))  Hemoglobin A1c   Collection Time: 12/12/16  8:45 AM  Result Value Ref Range   Hgb A1c MFr Bld 7.6 (H) 4.8 - 5.6 %   Mean Plasma Glucose 171 mg/dL  Results for orders placed or performed during the hospital encounter of 12/12/16 (from the past 336 hour(s))  CBC   Collection Time: 12/12/16  8:45 AM  Result Value Ref Range   WBC 7.7 4.0 - 10.5 K/uL   RBC 4.39 3.87 - 5.11 MIL/uL   Hemoglobin 11.6 (L) 12.0 - 15.0 g/dL   HCT 35.4 (L) 36.0 - 46.0 %   MCV 80.6 78.0 - 100.0 fL   MCH 26.4 26.0 - 34.0 pg   MCHC 32.8 30.0 - 36.0 g/dL   RDW 14.9 11.5 - 15.5 %   Platelets 273 150 - 400 K/uL  Basic metabolic panel   Collection Time: 12/12/16  8:45 AM  Result Value Ref Range   Sodium 134 (L) 135 - 145 mmol/L   Potassium 3.9 3.5 - 5.1 mmol/L  Chloride 103 101 - 111 mmol/L   CO2 23 22 - 32 mmol/L   Glucose, Bld 193 (H) 65 - 99 mg/dL   BUN 13 6 - 20 mg/dL   Creatinine, Ser 0.73 0.44 - 1.00 mg/dL   Calcium 9.3 8.9 - 10.3 mg/dL   GFR calc non Af Amer >60 >60 mL/min   GFR calc Af Amer >60 >60 mL/min   Anion gap 8 5 - 15  Type and screen   Collection Time: 12/12/16  8:45 AM  Result Value Ref Range   ABO/RH(D) A POS    Antibody Screen NEG    Sample Expiration 12/26/2016    Extend sample reason NO TRANSFUSIONS OR PREGNANCY IN THE PAST 3 MONTHS   ABO/Rh   Collection Time: 12/12/16  8:45 AM  Result Value Ref Range   ABO/RH(D) A POS     Imaging Studies: No results found.  Assessment: Patient Active Problem List   Diagnosis Date Noted  . Fibroids 12/06/2016  . Pelvic pain in female 12/06/2016  . Diabetes mellitus ([place]) 06/19/2012     Plan: Patient will undergo surgical management with Robot assisted total laparoscopic hysterectomy with bilateral salpingectomy.   The risks of surgery were discussed in detail with the patient including but not limited to: bleeding which may require transfusion or reoperation; infection which may require antibiotics; injury to surrounding organs which may involve bowel, bladder, ureters ; need for additional procedures including laparoscopy or laparotomy; thromboembolic phenomenon, surgical site problems and other postoperative/anesthesia complications. Likelihood of success in alleviating the patient's condition was discussed. Routine postoperative instructions will be reviewed with the patient and her family in detail after surgery.  The patient concurred with the proposed plan, giving informed written consent for the surgery.  Patient has been NPO since last night she will remain NPO for procedure.  Anesthesia and OR aware.  Preoperative prophylactic antibiotics and SCDs ordered on call to the OR.  To OR when ready.  Loyd Salvador L. Harraway-Smith, M.D., [facility] 12/20/2016 7:06 AM

## 2016-12-21 ENCOUNTER — Encounter (HOSPITAL_COMMUNITY): Payer: Self-pay | Admitting: Obstetrics & Gynecology

## 2016-12-21 DIAGNOSIS — N938 Other specified abnormal uterine and vaginal bleeding: Secondary | ICD-10-CM | POA: Diagnosis not present

## 2016-12-21 DIAGNOSIS — D259 Leiomyoma of uterus, unspecified: Secondary | ICD-10-CM | POA: Diagnosis not present

## 2016-12-21 DIAGNOSIS — N800 Endometriosis of uterus: Secondary | ICD-10-CM | POA: Diagnosis not present

## 2016-12-21 DIAGNOSIS — Z7984 Long term (current) use of oral hypoglycemic drugs: Secondary | ICD-10-CM | POA: Diagnosis not present

## 2016-12-21 DIAGNOSIS — N838 Other noninflammatory disorders of ovary, fallopian tube and broad ligament: Secondary | ICD-10-CM | POA: Diagnosis not present

## 2016-12-21 DIAGNOSIS — R102 Pelvic and perineal pain: Secondary | ICD-10-CM | POA: Diagnosis not present

## 2016-12-21 DIAGNOSIS — E119 Type 2 diabetes mellitus without complications: Secondary | ICD-10-CM | POA: Diagnosis not present

## 2016-12-21 LAB — BASIC METABOLIC PANEL
Anion gap: 6 (ref 5–15)
BUN: 10 mg/dL (ref 6–20)
CO2: 24 mmol/L (ref 22–32)
Calcium: 8.2 mg/dL — ABNORMAL LOW (ref 8.9–10.3)
Chloride: 107 mmol/L (ref 101–111)
Creatinine, Ser: 0.72 mg/dL (ref 0.44–1.00)
GFR calc Af Amer: >60 mL/min (ref >60)
GFR calc non Af Amer: >60 mL/min (ref >60)
Glucose, Bld: 148 mg/dL — ABNORMAL HIGH (ref 65–99)
Potassium: 3.5 mmol/L (ref 3.5–5.1)
Sodium: 137 mmol/L (ref 135–145)

## 2016-12-21 LAB — CBC
HCT: 29.7 % — ABNORMAL LOW (ref 36.0–46.0)
Hemoglobin: 9.8 g/dL — ABNORMAL LOW (ref 12.0–15.0)
MCH: 26.6 pg (ref 26.0–34.0)
MCHC: 33 g/dL (ref 30.0–36.0)
MCV: 80.7 fL (ref 78.0–100.0)
Platelets: 244 10*3/uL (ref 150–400)
RBC: 3.68 MIL/uL — ABNORMAL LOW (ref 3.87–5.11)
RDW: 15.1 % (ref 11.5–15.5)
WBC: 10.1 10*3/uL (ref 4.0–10.5)

## 2016-12-21 LAB — GLUCOSE, CAPILLARY: Glucose-Capillary: 129 mg/dL — ABNORMAL HIGH (ref 65–99)

## 2016-12-21 MED ORDER — IBUPROFEN 600 MG PO TABS: 600.0000 mg | ORAL_TABLET | Freq: Four times a day (QID) | ORAL | 0 refills | Status: DC | PRN

## 2016-12-21 MED ORDER — OXYCODONE-ACETAMINOPHEN 5-325 MG PO TABS: 1.0000 | ORAL_TABLET | ORAL | 0 refills | Status: DC | PRN

## 2016-12-21 MED ORDER — DOCUSATE SODIUM 100 MG PO CAPS: 100.0000 mg | ORAL_CAPSULE | Freq: Two times a day (BID) | ORAL | 0 refills | Status: DC

## 2016-12-21 NOTE — Progress Notes (Signed)
Pt d/c home teaching complete  Out in wheelchair

## 2016-12-21 NOTE — Discharge Instructions (Signed)

## 2016-12-21 NOTE — Discharge Summary (Signed)
Physician Discharge Summary  Patient ID: Robin Kennedy MRN: [id number] DOB/AGE: February 18, 1976 41 y.o.  Admit date: 12/20/2016 Discharge date: 12/21/2016  Admission Diagnoses: Pelvic pain; AUB; fibroids   Discharge Diagnoses:  Principal Problem:   Fibroids Active Problems:   Pelvic pain in female   Post-operative state   Discharged Condition: good  Hospital Course: Patient had an uncomplicated surgery; for further details of this surgery, please refer to the operative note. Furthermore, the patient had an uncomplicated postoperative course.  By time of discharge, her pain was controlled on oral pain medications; she was ambulating, voiding without difficulty, tolerating regular diet and passing flatus.  She was deemed stable for discharge to home.  Her glucose was well controlled throughout her admission. I reviewed with her the importance of glucose control.  Consults: None  Significant Diagnostic Studies: labs: CBC  Treatments: surgery: Robot assisted total lap hyst with bilateral salpingectomy  Discharge Exam: Blood pressure 102/70, pulse 97, temperature 98.2 F (36.8 C), temperature source Oral, resp. rate 18, SpO2 100 %.  I/O last 3 completed shifts: In: 1780 [P.O.:480; I.V.:1300] Out: 1800 [Urine:1600; Blood:200] Total I/O In: -  Out: 350 [Urine:350]    General appearance: alert and no distress Resp: clear to auscultation bilaterally Cardio: regular rate and rhythm, S1, S2 normal, no murmur, click, rub or gallop GI: soft, non-tender; bowel sounds normal; no masses,  no organomegaly Extremities: extremities normal, atraumatic, no cyanosis or edema Incision/Wound: all port site are intact with no erythema or tenderness.  CBC Latest Ref Rng & Units 12/21/2016 12/12/2016 01/16/2016  WBC 4.0 - 10.5 K/uL 10.1 7.7 -  Hemoglobin 12.0 - 15.0 g/dL 9.8(L) 11.6(L) 12.6  Hematocrit 36.0 - 46.0 % 29.7(L) 35.4(L) 37.0  Platelets 150 - 400 K/uL 244 273 -   Disposition: 01-Home or  Self Care  Discharge Instructions    Call MD for:  difficulty breathing, headache or visual disturbances    Complete by:  As directed    Call MD for:  extreme fatigue    Complete by:  As directed    Call MD for:  hives    Complete by:  As directed    Call MD for:  persistant dizziness or light-headedness    Complete by:  As directed    Call MD for:  persistant nausea and vomiting    Complete by:  As directed    Call MD for:  redness, tenderness, or signs of infection (pain, swelling, redness, odor or green/yellow discharge around incision site)    Complete by:  As directed    Call MD for:  severe uncontrolled pain    Complete by:  As directed    Call MD for:  temperature >100.4    Complete by:  As directed    Diet Carb Modified    Complete by:  As directed    Driving Restrictions    Complete by:  As directed    No driving for 2 weeks   Increase activity slowly    Complete by:  As directed    Lifting restrictions    Complete by:  As directed    No heavy lifting for 4 weeks   Sexual Activity Restrictions    Complete by:  As directed    No sexual activity for 8 weeks      Allergies as of 12/21/2016   No Known Allergies     Medication List    TAKE these medications   docusate sodium 100 MG capsule Commonly known as:  COLACE Take 1 capsule (100 mg total) by mouth 2 (two) times daily.   glimepiride 4 MG tablet Commonly known as:  AMARYL Take 4 mg by mouth 2 (two) times daily.   glucose blood test strip Commonly known as:  ONE TOUCH ULTRA TEST Use to check cbgs qd   ibuprofen 600 MG tablet Commonly known as:  ADVIL,MOTRIN Take 1 tablet (600 mg total) by mouth every 6 (six) hours as needed (mild pain).   JANUMET XR 50-1000 MG Tb24 Generic drug:  SitaGLIPtin-MetFORMIN HCl Take 1 tablet by mouth daily.   oxyCODONE-acetaminophen 5-325 MG tablet Commonly known as:  PERCOCET/ROXICET Take 1-2 tablets by mouth every 4 (four) hours as needed (moderate to severe pain  (when tolerating fluids)).       Follow-up Information    [facility] Follow up in 2 week(s).   Contact information: [address], [place] [phone]          Signed: Lavonia Drafts 12/21/2016, 8:29 AM

## 2017-01-04 ENCOUNTER — Ambulatory Visit (INDEPENDENT_AMBULATORY_CARE_PROVIDER_SITE_OTHER): Payer: BLUE CROSS/BLUE SHIELD | Admitting: Obstetrics & Gynecology

## 2017-01-04 ENCOUNTER — Encounter: Payer: Self-pay | Admitting: Obstetrics & Gynecology

## 2017-01-04 VITALS — BP 130/79 | HR 112 | Wt 213.0 lb

## 2017-01-04 DIAGNOSIS — Z9889 Other specified postprocedural states: Secondary | ICD-10-CM

## 2017-01-04 NOTE — Patient Instructions (Signed)

## 2017-01-04 NOTE — Progress Notes (Signed)
History:  41 y.o. [name] here today for 2 week post op check. Pt reports that she is doing well. This is her first day out of the house. She has limited her activities.  The following portions of the patient's history were reviewed and updated as appropriate: allergies, current medications, past family history, past medical history, past social history, past surgical history and problem list.  Review of Systems:  Pertinent items are noted in HPI.   Objective:  Physical Exam Weight 213 lb (96.6 kg). Gen: NAD Lungs: CTA CV: RRR Abd: Soft, nontender and non distended; port sites healing well Pelvic: Normal appearing external genitalia; normal appearing vaginal mucosa and cervix.  Normal discharge.  Small uterus, no other palpable masses, no uterine or adnexal tenderness  Labs and Imaging 12/20/2016 Diagnosis Uterus, cervix and bilateral fallopian tubes - UTERUS: -ENDOMETRIUM: SECRETORY ENDOMETRIUM. NO HYPERPLASIA OR MALIGNANCY. -MYOMETRIUM: ADENOMYOSIS. LEIOMYOMATA. NO MALIGNANCY. -SEROSA: UNREMARKABLE. NO MALIGNANCY. - CERVIX: BENIGN SQUAMOUS AND ENDOCERVICAL MUCOSA. NO DYSPLASIA OR MALIGNANCY. - BILATERAL FALLOPIAN TUBES: PARATUBAL CYSTS. NO MALIGNANCY.  Assessment & Plan:  2 week post op check. Pt is doing well  I reviewed surg path with pt Gradual increase of activities return to work 6 weeks post op NO intercourse for 8 full weeks post op  Idris Edmundson L. Harraway-Smith, M.D., [name]

## 2017-01-27 ENCOUNTER — Ambulatory Visit: Payer: BLUE CROSS/BLUE SHIELD | Admitting: Obstetrics & Gynecology

## 2017-01-30 ENCOUNTER — Ambulatory Visit (INDEPENDENT_AMBULATORY_CARE_PROVIDER_SITE_OTHER): Payer: BLUE CROSS/BLUE SHIELD | Admitting: Obstetrics & Gynecology

## 2017-01-30 ENCOUNTER — Encounter: Payer: Self-pay | Admitting: Obstetrics & Gynecology

## 2017-01-30 VITALS — BP 122/65 | HR 85 | Ht 68.0 in | Wt 211.0 lb

## 2017-01-30 DIAGNOSIS — Z9889 Other specified postprocedural states: Secondary | ICD-10-CM

## 2017-01-30 NOTE — Patient Instructions (Addendum)
MIRALAX- 1-2 capfuls daily until you're having 1 BM per day.   Preventing Constipation After Surgery Constipation is when a person has fewer than 3 bowel movements a week; has difficulty having a bowel movement; or has stools that are dry, hard, or larger than normal. Many things can make constipation likely after surgery. They include:  Medicines, especially numbing medicines (anesthetics) and very strong pain medicines called narcotics.  Feeling stressed because of the surgery.  Eating different foods than normal.  Being less active.  Symptoms of constipation include:  Having fewer than 3 bowel movements a week.  Straining to have a bowel movement.  Having hard, dry, or larger-than-normal stools.  Feeling full or bloated.  Having pain in the lower abdomen.  Not feeling relief after having a bowel movement.  Follow these instructions at home: Diet  Eat foods that have a lot of fiber. These include fruits, vegetables, whole grains, and beans. Limit foods high in fat and processed sugars. These include french fries, hamburgers, cookies, and candy.  Take a fiber supplement as directed. If you are not taking a fiber supplement and think that you are not getting enough fiber from foods, talk to your health care provider about adding a fiber supplement to your diet.  Drink clear fluids, especially water. Avoid drinking alcohol, caffeine, and soda. These can make constipation worse.  Drink enough fluids to keep your urine clear or pale yellow. Activity  After surgery, return to your normal activities slowly or when your health care provider says it is okay.  Start walking as soon as you can. Try to go a little farther each day.  Once your health care provider approves, do some sort of regular exercise. This helps prevent constipation. Bowel Movements  Go to the restroom when you have the urge to go. Do not hold it in.  Try drinking something hot to get a bowel movement  started.  Keep track of how often you use the restroom. If you miss 2-3 bowel movements, talk to your health care provider about medicines that prevent constipation. Your health care provider may suggest a stool softener, laxative, or fiber supplement.  Only take over-the-counter or prescription medicines as directed by your health care provider.  Do not take other medicines without talking to your health care provider first. If you become constipated and take a medicine to make you have a bowel movement, the problem may get worse. Other kinds of medicine can also make the problem worse. Contact a health care provider if:  You used stool softeners or laxatives and still have not had a bowel movement within 24-48 hours after using them.  You have not had a bowel movement in 3 days. Get help right away if:  Your constipation lasts for more than 4 days or gets worse.  You have bright red blood in your stool.  You have abdominal or rectal pain.  You have very bad cramping.  You have thin, pencil-like stools.  You have unexplained weight loss.  You have a fever or persistent symptoms for more than 2-3 days.  You have a fever and your symptoms suddenly get worse. This information is not intended to replace advice given to you by your health care provider. Make sure you discuss any questions you have with your health care provider. Document Released: 11/26/2012 Document Revised: 01/07/2016 Document Reviewed: 09/14/2012 Elsevier Interactive Patient Education  2018 [employer]. Total Laparoscopic Hysterectomy, Care After Refer to this sheet in the next few weeks.  These instructions provide you with information on caring for yourself after your procedure. Your health care provider may also give you more specific instructions. Your treatment has been planned according to current medical practices, but problems sometimes occur. Call your health care provider if you have any problems or  questions after your procedure. What can I expect after the procedure?  Pain and bruising at the incision sites. You will be given pain medicine to control it.  Menopausal symptoms such as hot flashes, night sweats, and insomnia if your ovaries were removed.  Sore throat from the breathing tube that was inserted during surgery. Follow these instructions at home:  Only take over-the-counter or prescription medicines for pain, discomfort, or fever as directed by your health care provider.  Do not take aspirin. It can cause bleeding.  Do not drive when taking pain medicine.  Follow your health care provider's advice regarding diet, exercise, lifting, driving, and general activities.  Resume your usual diet as directed and allowed.  Get plenty of rest and sleep.  Do not douche, use tampons, or have sexual intercourse for at least 6 weeks, or until your health care provider gives you permission.  Change your bandages (dressings) as directed by your health care provider.  Monitor your temperature and notify your health care provider of a fever.  Take showers instead of baths for 2-3 weeks.  Do not drink alcohol until your health care provider gives you permission.  If you develop constipation, you may take a mild laxative with your health care provider's permission. Bran foods may help with constipation problems. Drinking enough fluids to keep your urine clear or pale yellow may help as well.  Try to have someone home with you for 1-2 weeks to help around the house.  Keep all of your follow-up appointments as directed by your health care provider. Contact a health care provider if:  You have swelling, redness, or increasing pain around your incision sites.  You have pus coming from your incision.  You notice a bad smell coming from your incision.  Your incision breaks open.  You feel dizzy or lightheaded.  You have pain or bleeding when you urinate.  You have persistent  diarrhea.  You have persistent nausea and vomiting.  You have abnormal vaginal discharge.  You have a rash.  You have any type of abnormal reaction or develop an allergy to your medicine.  You have poor pain control with your prescribed medicine. Get help right away if:  You have chest pain or shortness of breath.  You have severe abdominal pain that is not relieved with pain medicine.  You have pain or swelling in your legs. This information is not intended to replace advice given to you by your health care provider. Make sure you discuss any questions you have with your health care provider. Document Released: 05/22/2013 Document Revised: 01/07/2016 Document Reviewed: 02/19/2013 Elsevier Interactive Patient Education  2017 [employer].

## 2017-01-30 NOTE — Progress Notes (Signed)
History:  41 y.o. [id number] here today for her 6 week post check.  Pt is s/p  RATH with bilateral salpingectomy on 12/20/2016. Pt  Reports only 2 stools in 2 weeks. She reports that her stools are hard and difficult to pass. This is new for her.   She reports stools every 2 days prior to surgery.   She reports one episode of hot flushes 2 weeks ago. None since.  She denies problems voiding.    The following portions of the patient's history were reviewed and updated as appropriate: allergies, current medications, past family history, past medical history, past social history, past surgical history and problem list.  Review of Systems:  Pertinent items are noted in HPI.   Objective:  Physical Exam Blood pressure 122/65, pulse 85, height 5\' 8"  (1.727 m), weight 211 lb (95.7 kg). CONSTITUTIONAL: Well-developed, well-nourished female in no acute distress.  HENT:  Normocephalic, atraumatic EYES: Conjunctivae and EOM are normal. No scleral icterus.  NECK: Normal range of motion SKIN: Skin is warm and dry. No rash noted. Not diaphoretic.No pallor. [place]: Alert and oriented to person, place, and time. Normal coordination.  Abd: Soft, nontender and nondistended; port sites all closed. The LUQ port is healing slower. It appears that the glue came off it Is healing well by secondary intention.  Pelvic: Normal appearing external genitalia; normal appearing vaginal mucosa and cervix.  Normal discharge.  Small uterus, no other palpable masses, no uterine or adnexal tenderness  Labs and Imaging No results found.  Assessment & Plan:  6 week post op check constipation  MIRALAX- 1-2 capfuls daily until you're having 1 BM per day.  F/u in 3 months or sooner prn May begin intercourse in 2 weeks Gradual return to all activities.  Esterlene Atiyeh L. Harraway-Smith, M.D., [name]

## 2017-05-03 ENCOUNTER — Ambulatory Visit (INDEPENDENT_AMBULATORY_CARE_PROVIDER_SITE_OTHER): Payer: BLUE CROSS/BLUE SHIELD | Admitting: Obstetrics & Gynecology

## 2017-05-03 ENCOUNTER — Encounter: Payer: Self-pay | Admitting: Obstetrics & Gynecology

## 2017-05-03 VITALS — BP 111/66 | HR 75 | Resp 16 | Ht 68.0 in | Wt 211.0 lb

## 2017-05-03 DIAGNOSIS — Z01419 Encounter for gynecological examination (general) (routine) without abnormal findings: Secondary | ICD-10-CM

## 2017-05-03 DIAGNOSIS — Z309 Encounter for contraceptive management, unspecified: Secondary | ICD-10-CM

## 2017-05-03 NOTE — Progress Notes (Signed)
History:  41 y.o. [name] here today for 3 month f/u after a RATH with BSO. Pt reports no complaints. She denies incontinence. She does not report vasomotor sx. She denies pain with intercourse.     The following portions of the patient's history were reviewed and updated as appropriate: allergies, current medications, past family history, past medical history, past social history, past surgical history and problem list.  Review of Systems:  Pertinent items are noted in HPI.   Objective:  Physical Exam Blood pressure 111/66, pulse 75, resp. rate 16, height 5\' 8"  (1.727 m), weight 211 lb (95.7 kg), last menstrual period 11/06/2016. CONSTITUTIONAL: Well-developed, well-nourished female in no acute distress.  HENT:  Normocephalic, atraumatic EYES: Conjunctivae and EOM are normal. No scleral icterus.  NECK: Normal range of motion SKIN: Skin is warm and dry. No rash noted. Not diaphoretic.No pallor. [place]: Alert and oriented to person, place, and time. Normal coordination.  Abd: Soft, nontender and nondistended; well healed port sites Pelvic: Normal appearing external genitalia; normal appearing vaginal mucosa. Normal discharge.  Uterus surgically absent.  No palpable masses, no adnexal tenderness   Assessment & Plan:  3 month post op check- pt is doing well Rec f/u in 1 year or sooner prn  Total face-to-face time with patient was 10 min.  Greater than 50% was spent in counseling and coordination of care with the patient.   Robin Kennedy L. Robin Kennedy, M.D., Robin Kennedy

## 2017-06-15 DIAGNOSIS — E6609 Other obesity due to excess calories: Secondary | ICD-10-CM | POA: Diagnosis not present

## 2017-06-15 DIAGNOSIS — I80299 Phlebitis and thrombophlebitis of other deep vessels of unspecified lower extremity: Secondary | ICD-10-CM | POA: Diagnosis not present

## 2017-06-15 DIAGNOSIS — E049 Nontoxic goiter, unspecified: Secondary | ICD-10-CM | POA: Diagnosis not present

## 2017-06-15 DIAGNOSIS — E1165 Type 2 diabetes mellitus with hyperglycemia: Secondary | ICD-10-CM | POA: Diagnosis not present

## 2017-06-16 DIAGNOSIS — I809 Phlebitis and thrombophlebitis of unspecified site: Secondary | ICD-10-CM | POA: Diagnosis not present

## 2017-10-10 DIAGNOSIS — I80299 Phlebitis and thrombophlebitis of other deep vessels of unspecified lower extremity: Secondary | ICD-10-CM | POA: Diagnosis not present

## 2017-10-10 DIAGNOSIS — E049 Nontoxic goiter, unspecified: Secondary | ICD-10-CM | POA: Diagnosis not present

## 2017-10-10 DIAGNOSIS — E1165 Type 2 diabetes mellitus with hyperglycemia: Secondary | ICD-10-CM | POA: Diagnosis not present

## 2017-10-17 DIAGNOSIS — E1165 Type 2 diabetes mellitus with hyperglycemia: Secondary | ICD-10-CM | POA: Diagnosis not present

## 2017-10-17 DIAGNOSIS — E049 Nontoxic goiter, unspecified: Secondary | ICD-10-CM | POA: Diagnosis not present

## 2017-10-17 DIAGNOSIS — E6609 Other obesity due to excess calories: Secondary | ICD-10-CM | POA: Diagnosis not present

## 2017-12-08 DIAGNOSIS — E119 Type 2 diabetes mellitus without complications: Secondary | ICD-10-CM | POA: Diagnosis not present

## 2018-06-19 DIAGNOSIS — E049 Nontoxic goiter, unspecified: Secondary | ICD-10-CM | POA: Diagnosis not present

## 2018-06-19 DIAGNOSIS — E1165 Type 2 diabetes mellitus with hyperglycemia: Secondary | ICD-10-CM | POA: Diagnosis not present

## 2018-06-19 DIAGNOSIS — E6609 Other obesity due to excess calories: Secondary | ICD-10-CM | POA: Diagnosis not present

## 2018-07-18 ENCOUNTER — Encounter: Payer: Self-pay | Admitting: Obstetrics & Gynecology

## 2018-07-18 ENCOUNTER — Ambulatory Visit (INDEPENDENT_AMBULATORY_CARE_PROVIDER_SITE_OTHER): Payer: BLUE CROSS/BLUE SHIELD | Admitting: Obstetrics & Gynecology

## 2018-07-18 VITALS — BP 97/67 | HR 97 | Ht 68.0 in | Wt 211.0 lb

## 2018-07-18 DIAGNOSIS — Z1239 Encounter for other screening for malignant neoplasm of breast: Secondary | ICD-10-CM

## 2018-07-18 DIAGNOSIS — Z01419 Encounter for gynecological examination (general) (routine) without abnormal findings: Secondary | ICD-10-CM

## 2018-07-18 NOTE — Progress Notes (Signed)
Subjective:     Robin Kennedy is a 42 y.o. female here for a routine exam.  Current complaints: none. Pt reports that we are her primary care provider. She does see endocrine for control of her DM.       Gynecologic History Patient's last menstrual period was 11/06/2016 (exact date). Contraception: status post hysterectomy Last Pap: 10/17/2016. Results were: normal Last mammogram: 10/31/2016. Results were: normal  Obstetric History OB History  Gravida Para Term Preterm AB Living  _0 0 1 3  SAB TAB Ectopic Multiple Live Births  0 1 0   3    # Outcome Date GA Lbr Len/2nd Weight Sex Delivery Anes PTL Lv  4 Term 02/06/02    M Vag-Spont   LIV  3 TAB 1998          2 Term 10/09/95    M Vag-Spont   LIV  1 Term 11/19/93    F Vag-Spont   LIV    The following portions of the patient's history were reviewed and updated as appropriate: allergies, current medications, past family history, past medical history, past social history, past surgical history and problem list.  Review of Systems Pertinent items are noted in HPI.    Objective:  BP 97/67   Pulse 97   Ht _1  (1.727 m)   Wt 211 lb (95.7 kg)   LMP 11/06/2016 (Exact Date)   BMI 32.08 kg/m  General Appearance:    Alert, cooperative, no distress, appears stated age  Head:    Normocephalic, without obvious abnormality, atraumatic  Eyes:    conjunctiva/corneas clear, EOM's intact, both eyes  Ears:    Normal external ear canals, both ears  Nose:   Nares normal, septum midline, mucosa normal, no drainage    or sinus tenderness  Throat:   Lips, mucosa, and tongue normal; teeth and gums normal  Neck:   Supple, symmetrical, trachea midline, no adenopathy;    thyroid:  no enlargement/tenderness/nodules  Back:     Symmetric, no curvature, ROM normal, no CVA tenderness  Lungs:     Clear to auscultation bilaterally, respirations unlabored  Chest Wall:    No tenderness or deformity   Heart:    Regular rate and rhythm, S1 and S2  normal, no murmur, rub   or gallop  Breast Exam:    No tenderness, masses, or nipple abnormality  Abdomen:     Soft, non-tender, bowel sounds active all four quadrants,    no masses, no organomegaly  Genitalia:    Normal female without lesion, discharge or tenderness     Extremities:   Extremities normal, atraumatic, no cyanosis or edema  Pulses:   2+ and symmetric all extremities  Skin:   Skin color, texture, turgor normal, no rashes or lesions    Assessment:   Robin Kennedy was seen today for gynecologic exam.  Diagnoses and all orders for this visit:  Well female exam with routine gynecological exam -     Hemoglobin A1c -     HIV antibody (with reflex) -     CBC w/Diff -     Comp Met (CMET)  Breast cancer screening -     Cancel: MM DIGITAL SCREENING BILATERAL; Future -     MM 3D SCREEN BREAST BILATERAL; Future  Other orders -     Fe Fum-FePoly-FA-Vit C-Vit B3 (INTEGRA F) 125-1 MG CAPS; Take 1 capsule by mouth daily.  Stephen Baruch L. Harraway-Smith, M.D., [name]

## 2018-07-19 ENCOUNTER — Ambulatory Visit (HOSPITAL_BASED_OUTPATIENT_CLINIC_OR_DEPARTMENT_OTHER)
Admission: RE | Admit: 2018-07-19 | Discharge: 2018-07-19 | Disposition: A | Discharge status: Home / Self Care | Payer: BLUE CROSS/BLUE SHIELD | Source: Ambulatory Visit | Attending: Obstetrics & Gynecology | Admitting: Obstetrics & Gynecology

## 2018-07-19 ENCOUNTER — Telehealth: Payer: Self-pay

## 2018-07-19 DIAGNOSIS — Z1239 Encounter for other screening for malignant neoplasm of breast: Secondary | ICD-10-CM | POA: Insufficient documentation

## 2018-07-19 DIAGNOSIS — Z1231 Encounter for screening mammogram for malignant neoplasm of breast: Secondary | ICD-10-CM | POA: Diagnosis not present

## 2018-07-19 LAB — CBC WITH DIFFERENTIAL/PLATELET
Basophils Absolute: 0 10*3/uL (ref 0.0–0.2)
Basos: 0 %
EOS (ABSOLUTE): 0.1 10*3/uL (ref 0.0–0.4)
Eos: 1 %
Hematocrit: 36.8 % (ref 34.0–46.6)
Hemoglobin: 12.4 g/dL (ref 11.1–15.9)
Immature Grans (Abs): 0 10*3/uL (ref 0.0–0.1)
Immature Granulocytes: 0 %
Lymphocytes Absolute: 3.1 10*3/uL (ref 0.7–3.1)
Lymphs: 43 %
MCH: 27.1 pg (ref 26.6–33.0)
MCHC: 33.7 g/dL (ref 31.5–35.7)
MCV: 80 fL (ref 79–97)
Monocytes Absolute: 0.4 10*3/uL (ref 0.1–0.9)
Monocytes: 6 %
Neutrophils Absolute: 3.6 10*3/uL (ref 1.4–7.0)
Neutrophils: 50 %
Platelets: 283 10*3/uL (ref 150–450)
RBC: 4.58 x10E6/uL (ref 3.77–5.28)
RDW: 13 % (ref 12.3–15.4)
WBC: 7.2 10*3/uL (ref 3.4–10.8)

## 2018-07-19 LAB — COMPREHENSIVE METABOLIC PANEL
ALT: 9 IU/L (ref 0–32)
AST: 9 IU/L (ref 0–40)
Albumin/Globulin Ratio: 2 (ref 1.2–2.2)
Albumin: 4.6 g/dL (ref 3.5–5.5)
Alkaline Phosphatase: 48 IU/L (ref 39–117)
BUN/Creatinine Ratio: 15 (ref 9–23)
BUN: 9 mg/dL (ref 6–24)
Bilirubin Total: 0.3 mg/dL (ref 0.0–1.2)
CO2: 18 mmol/L — ABNORMAL LOW (ref 20–29)
Calcium: 9.7 mg/dL (ref 8.7–10.2)
Chloride: 103 mmol/L (ref 96–106)
Creatinine, Ser: 0.61 mg/dL (ref 0.57–1.00)
GFR calc Af Amer: 129 mL/min/{1.73_m2} (ref >59)
GFR calc non Af Amer: 112 mL/min/{1.73_m2} (ref >59)
Globulin, Total: 2.3 g/dL (ref 1.5–4.5)
Glucose: 146 mg/dL — ABNORMAL HIGH (ref 65–99)
Potassium: 4.1 mmol/L (ref 3.5–5.2)
Sodium: 138 mmol/L (ref 134–144)
Total Protein: 6.9 g/dL (ref 6.0–8.5)

## 2018-07-19 LAB — HEMOGLOBIN A1C
Est. average glucose Bld gHb Est-mCnc: 223 mg/dL
Hgb A1c MFr Bld: 9.4 % — ABNORMAL HIGH (ref 4.8–5.6)

## 2018-07-19 LAB — HIV ANTIBODY (ROUTINE TESTING W REFLEX): HIV Screen 4th Generation wRfx: NONREACTIVE

## 2018-07-19 MED ORDER — INTEGRA F 125-1 MG PO CAPS: 1.0000 | ORAL_CAPSULE | Freq: Every day | ORAL | 6 refills | Status: DC

## 2018-07-19 NOTE — Telephone Encounter (Signed)
Patient called and made aware that she needs to begin taking Integra daily on empty stomach. Patient advised best absorbed with OJ. Patient warned of constipation and may turn stools darker color.   ALso made aware of hgba1c >9% and this needs attention from her endocrinologist. Patient states she was just started on new medication from endocrinologist and is schedule for follow up at beginning of feb. 2020. Kathrene Alu RN

## 2018-07-19 NOTE — Telephone Encounter (Signed)
-----   Message from Lavonia Drafts, MD sent at 07/19/2018  2:36 PM EST ----- Please call pt.   #1 Please call pt. She is anemic. She needs to begin Integra daily. Rx sent to pharmacy  She should take this med on an empty stomach.   She needs a repeat CBC in 3 months. Will decide at that point how long she needs to be on the meds.    Please warn her that her stools may turn dark and she may develop some constipation while on this medication.    #2  Her HgbA1c is >9%. She should f/u with her endocrinologist for better control.   Thx, clh-S

## 2018-09-02 DIAGNOSIS — S80861A Insect bite (nonvenomous), right lower leg, initial encounter: Secondary | ICD-10-CM | POA: Diagnosis not present

## 2018-12-19 DIAGNOSIS — E1165 Type 2 diabetes mellitus with hyperglycemia: Secondary | ICD-10-CM | POA: Diagnosis not present

## 2018-12-19 DIAGNOSIS — E049 Nontoxic goiter, unspecified: Secondary | ICD-10-CM | POA: Diagnosis not present

## 2018-12-25 DIAGNOSIS — E049 Nontoxic goiter, unspecified: Secondary | ICD-10-CM | POA: Diagnosis not present

## 2018-12-25 DIAGNOSIS — E6609 Other obesity due to excess calories: Secondary | ICD-10-CM | POA: Diagnosis not present

## 2018-12-25 DIAGNOSIS — E1165 Type 2 diabetes mellitus with hyperglycemia: Secondary | ICD-10-CM | POA: Diagnosis not present

## 2019-05-01 DIAGNOSIS — E1165 Type 2 diabetes mellitus with hyperglycemia: Secondary | ICD-10-CM | POA: Diagnosis not present

## 2019-05-01 DIAGNOSIS — E049 Nontoxic goiter, unspecified: Secondary | ICD-10-CM | POA: Diagnosis not present

## 2019-05-01 DIAGNOSIS — E6609 Other obesity due to excess calories: Secondary | ICD-10-CM | POA: Diagnosis not present

## 2019-05-27 DIAGNOSIS — E119 Type 2 diabetes mellitus without complications: Secondary | ICD-10-CM | POA: Diagnosis not present

## 2019-07-16 DIAGNOSIS — E1165 Type 2 diabetes mellitus with hyperglycemia: Secondary | ICD-10-CM | POA: Diagnosis not present

## 2019-07-16 DIAGNOSIS — E049 Nontoxic goiter, unspecified: Secondary | ICD-10-CM | POA: Diagnosis not present

## 2019-07-23 DIAGNOSIS — E6609 Other obesity due to excess calories: Secondary | ICD-10-CM | POA: Diagnosis not present

## 2019-07-23 DIAGNOSIS — E049 Nontoxic goiter, unspecified: Secondary | ICD-10-CM | POA: Diagnosis not present

## 2019-07-23 DIAGNOSIS — E1165 Type 2 diabetes mellitus with hyperglycemia: Secondary | ICD-10-CM | POA: Diagnosis not present

## 2019-08-19 ENCOUNTER — Ambulatory Visit: Payer: BLUE CROSS/BLUE SHIELD | Admitting: Obstetrics & Gynecology

## 2019-09-13 ENCOUNTER — Encounter: Payer: Self-pay | Admitting: Obstetrics & Gynecology

## 2019-09-13 ENCOUNTER — Ambulatory Visit (INDEPENDENT_AMBULATORY_CARE_PROVIDER_SITE_OTHER): Payer: 59 | Admitting: Obstetrics & Gynecology

## 2019-09-13 ENCOUNTER — Other Ambulatory Visit: Payer: Self-pay

## 2019-09-13 VITALS — BP 127/77 | HR 73 | Ht 68.0 in

## 2019-09-13 DIAGNOSIS — Z1239 Encounter for other screening for malignant neoplasm of breast: Secondary | ICD-10-CM | POA: Diagnosis not present

## 2019-09-13 DIAGNOSIS — R209 Unspecified disturbances of skin sensation: Secondary | ICD-10-CM | POA: Diagnosis not present

## 2019-09-13 DIAGNOSIS — R231 Pallor: Secondary | ICD-10-CM

## 2019-09-13 DIAGNOSIS — Z01419 Encounter for gynecological examination (general) (routine) without abnormal findings: Secondary | ICD-10-CM | POA: Diagnosis not present

## 2019-09-13 DIAGNOSIS — D508 Other iron deficiency anemias: Secondary | ICD-10-CM | POA: Diagnosis not present

## 2019-09-13 NOTE — Patient Instructions (Signed)
Menopause Menopause is the normal time of life when menstrual periods stop completely. It is usually confirmed by 12 months without a menstrual period. The transition to menopause (perimenopause) most often happens between the ages of 45 and 55. During perimenopause, hormone levels change in your body, which can cause symptoms and affect your health. Menopause may increase your risk for:  Loss of bone (osteoporosis), which causes bone breaks (fractures).  Depression.  Hardening and narrowing of the arteries (atherosclerosis), which can cause heart attacks and strokes. What are the causes? This condition is usually caused by a natural change in hormone levels that happens as you get older. The condition may also be caused by surgery to remove both ovaries (bilateral oophorectomy). What increases the risk? This condition is more likely to start at an earlier age if you have certain medical conditions or treatments, including:  A tumor of the pituitary gland in the brain.  A disease that affects the ovaries and hormone production.  Radiation treatment for cancer.  Certain cancer treatments, such as chemotherapy or hormone (anti-estrogen) therapy.  Heavy smoking and excessive alcohol use.  Family history of early menopause. This condition is also more likely to develop earlier in women who are very thin. What are the signs or symptoms? Symptoms of this condition include:  Hot flashes.  Irregular menstrual periods.  Night sweats.  Changes in feelings about sex. This could be a decrease in sex drive or an increased comfort around your sexuality.  Vaginal dryness and thinning of the vaginal walls. This may cause painful intercourse.  Dryness of the skin and development of wrinkles.  Headaches.  Problems sleeping (insomnia).  Mood swings or irritability.  Memory problems.  Weight gain.  Hair growth on the face and chest.  Bladder infections or problems with urinating. How  is this diagnosed? This condition is diagnosed based on your medical history, a physical exam, your age, your menstrual history, and your symptoms. Hormone tests may also be done. How is this treated? In some cases, no treatment is needed. You and your health care provider should make a decision together about whether treatment is necessary. Treatment will be based on your individual condition and preferences. Treatment for this condition focuses on managing symptoms. Treatment may include:  Menopausal hormone therapy (MHT).  Medicines to treat specific symptoms or complications.  Acupuncture.  Vitamin or herbal supplements. Before starting treatment, make sure to let your health care provider know if you have a personal or family history of:  Heart disease.  Breast cancer.  Blood clots.  Diabetes.  Osteoporosis. Follow these instructions at home: Lifestyle  Do not use any products that contain nicotine or tobacco, such as cigarettes and e-cigarettes. If you need help quitting, ask your health care provider.  Get at least 30 minutes of physical activity on 5 or more days each week.  Avoid alcoholic and caffeinated beverages, as well as spicy foods. This may help prevent hot flashes.  Get 7-8 hours of sleep each night.  If you have hot flashes, try: ? Dressing in layers. ? Avoiding things that may trigger hot flashes, such as spicy food, warm places, or stress. ? Taking slow, deep breaths when a hot flash starts. ? Keeping a fan in your home and office.  Find ways to manage stress, such as deep breathing, meditation, or journaling.  Consider going to group therapy with other women who are having menopause symptoms. Ask your health care provider about recommended group therapy meetings. Eating and   drinking  Eat a healthy, balanced diet that contains whole grains, lean protein, low-fat dairy, and plenty of fruits and vegetables.  Your health care provider may recommend  adding more soy to your diet. Foods that contain soy include tofu, tempeh, and soy milk.  Eat plenty of foods that contain calcium and vitamin D for bone health. Items that are rich in calcium include low-fat milk, yogurt, beans, almonds, sardines, broccoli, and kale. Medicines  Take over-the-counter and prescription medicines only as told by your health care provider.  Talk with your health care provider before starting any herbal supplements. If prescribed, take vitamins and supplements as told by your health care provider. These may include: ? Calcium. Women age 51 and older should get 1,200 mg (milligrams) of calcium every day. ? Vitamin D. Women need 600-800 International Units of vitamin D each day. ? Vitamins B12 and B6. Aim for 50 micrograms of B12 and 1.5 mg of B6 each day. General instructions  Keep track of your menstrual periods, including: ? When they occur. ? How heavy they are and how long they last. ? How much time passes between periods.  Keep track of your symptoms, noting when they start, how often you have them, and how long they last.  Use vaginal lubricants or moisturizers to help with vaginal dryness and improve comfort during sex.  Keep all follow-up visits as told by your health care provider. This is important. This includes any group therapy or counseling. Contact a health care provider if:  You are still having menstrual periods after age 55.  You have pain during sex.  You have not had a period for 12 months and you develop vaginal bleeding. Get help right away if:  You have: ? Severe depression. ? Excessive vaginal bleeding. ? Pain when you urinate. ? A fast or irregular heart beat (palpitations). ? Severe headaches. ? Abdomen (abdominal) pain or severe indigestion.  You fell and you think you have a broken bone.  You develop leg or chest pain.  You develop vision problems.  You feel a lump in your breast. Summary  Menopause is the normal  time of life when menstrual periods stop completely. It is usually confirmed by 12 months without a menstrual period.  The transition to menopause (perimenopause) most often happens between the ages of 45 and 55.  Symptoms can be managed through medicines, lifestyle changes, and complementary therapies such as acupuncture.  Eat a balanced diet that is rich in nutrients to promote bone health and heart health and to manage symptoms during menopause. This information is not intended to replace advice given to you by your health care provider. Make sure you discuss any questions you have with your health care provider. Document Revised: 07/14/2017 Document Reviewed: 09/03/2016 Elsevier Patient Education  2020 [employer]  

## 2019-09-13 NOTE — Progress Notes (Signed)
Subjective:     Robin Kennedy is a 44 y.o. female here for a routine exam.  LMP 2018. Pt is s/p hyst. Current complaints: Pt reports new onset of hot flushes. They occur intermittently. Not incapacitating.   She denies bleeding, pelvic pain or leaking of urine.       Gynecologic History Patient's last menstrual period was 11/06/2016 (exact date). Contraception: status post hysterectomy Last Pap: 10/17/2016. Results were: normal Last mammogram: 07/19/2018. Results were: normal  Obstetric History OB History  Gravida Para Term Preterm AB Living  4 3 3  0 1 3  SAB TAB Ectopic Multiple Live Births  0 1 0   3    # Outcome Date GA Lbr Len/2nd Weight Sex Delivery Anes PTL Lv  4 Term 02/06/02    M Vag-Spont   LIV  3 TAB 1998          2 Term 10/09/95    M Vag-Spont   LIV  1 Term 11/19/93    F Vag-Spont   LIV     The following portions of the patient's history were reviewed and updated as appropriate: allergies, current medications, past family history, past medical history, past social history, past surgical history and problem list.  Review of Systems Pertinent items are noted in HPI.    Objective:  BP 127/77   Pulse 73   Ht 5\' 8"  (1.727 m)   LMP 11/06/2016 (Exact Date)   BMI 32.08 kg/m   General Appearance:    Alert, cooperative, no distress, appears stated age  Head:    Normocephalic, without obvious abnormality, atraumatic  Eyes:    conjunctiva/corneas clear, EOM's intact, both eyes  Ears:    Normal external ear canals, both ears  Nose:   Nares normal, septum midline, mucosa normal, no drainage    or sinus tenderness  Throat:   Lips, mucosa, and tongue normal; teeth and gums normal  Neck:   Supple, symmetrical, trachea midline, no adenopathy;    thyroid:  no enlargement/tenderness/nodules  Back:     Symmetric, no curvature, ROM normal, no CVA tenderness  Lungs:     respirations unlabored  Chest Wall:    No tenderness or deformity   Heart:    Regular rate and rhythm   Breast Exam:    No tenderness, masses, or nipple abnormality  Abdomen:     Soft, non-tender, bowel sounds active all four quadrants,    no masses, no organomegaly  Genitalia:    Normal female without lesion, discharge or tenderness; vaginal cuff well healed- no prolapse     Extremities:   Extremities normal, atraumatic, no cyanosis or edema  Pulses:   2+ and symmetric all extremities  Skin:   Skin color, texture, turgor normal, no rashes or lesions    Assessment:    Healthy female exam.   Hot flushes- may be related to perimenopause. Pt with a h/o anemia. Will check CBC and TSH    Plan:   Screening mammogram F/u in 1 year or sooner prn Labs: CBC and TSH  Anayeli Arel L. Harraway-Smith, M.D., [name]

## 2019-09-14 LAB — CBC
Hematocrit: 37.9 % (ref 34.0–46.6)
Hemoglobin: 12.4 g/dL (ref 11.1–15.9)
MCH: 27.3 pg (ref 26.6–33.0)
MCHC: 32.7 g/dL (ref 31.5–35.7)
MCV: 84 fL (ref 79–97)
Platelets: 269 10*3/uL (ref 150–450)
RBC: 4.54 x10E6/uL (ref 3.77–5.28)
RDW: 12.7 % (ref 11.7–15.4)
WBC: 6.6 10*3/uL (ref 3.4–10.8)

## 2019-09-14 LAB — TSH: TSH: 1.76 u[IU]/mL (ref 0.450–4.500)

## 2019-09-16 ENCOUNTER — Ambulatory Visit (HOSPITAL_BASED_OUTPATIENT_CLINIC_OR_DEPARTMENT_OTHER)
Admission: RE | Admit: 2019-09-16 | Discharge: 2019-09-16 | Disposition: A | Discharge status: Home / Self Care | Payer: 59 | Source: Ambulatory Visit | Attending: Obstetrics & Gynecology | Admitting: Obstetrics & Gynecology

## 2019-09-16 ENCOUNTER — Other Ambulatory Visit: Payer: Self-pay

## 2019-09-16 DIAGNOSIS — Z1239 Encounter for other screening for malignant neoplasm of breast: Secondary | ICD-10-CM

## 2019-09-16 DIAGNOSIS — Z1231 Encounter for screening mammogram for malignant neoplasm of breast: Secondary | ICD-10-CM | POA: Diagnosis present

## 2019-09-16 DIAGNOSIS — Z01419 Encounter for gynecological examination (general) (routine) without abnormal findings: Secondary | ICD-10-CM | POA: Diagnosis not present

## 2019-09-18 ENCOUNTER — Other Ambulatory Visit: Payer: Self-pay | Admitting: Obstetrics & Gynecology

## 2019-09-18 DIAGNOSIS — R928 Other abnormal and inconclusive findings on diagnostic imaging of breast: Secondary | ICD-10-CM

## 2019-09-24 ENCOUNTER — Ambulatory Visit
Admission: RE | Admit: 2019-09-24 | Discharge: 2019-09-24 | Disposition: A | Discharge status: Home / Self Care | Payer: 59 | Source: Ambulatory Visit | Attending: Obstetrics & Gynecology | Admitting: Obstetrics & Gynecology

## 2019-09-24 ENCOUNTER — Other Ambulatory Visit: Payer: Self-pay | Admitting: Obstetrics & Gynecology

## 2019-09-24 ENCOUNTER — Other Ambulatory Visit: Payer: Self-pay

## 2019-09-24 DIAGNOSIS — R921 Mammographic calcification found on diagnostic imaging of breast: Secondary | ICD-10-CM

## 2019-09-24 DIAGNOSIS — R928 Other abnormal and inconclusive findings on diagnostic imaging of breast: Secondary | ICD-10-CM

## 2019-10-02 ENCOUNTER — Ambulatory Visit
Admission: RE | Admit: 2019-10-02 | Discharge: 2019-10-02 | Disposition: A | Discharge status: Home / Self Care | Payer: 59 | Source: Ambulatory Visit | Attending: Obstetrics & Gynecology | Admitting: Obstetrics & Gynecology

## 2019-10-02 ENCOUNTER — Other Ambulatory Visit: Payer: Self-pay | Admitting: Obstetrics & Gynecology

## 2019-10-02 ENCOUNTER — Other Ambulatory Visit: Payer: Self-pay

## 2019-10-02 DIAGNOSIS — R921 Mammographic calcification found on diagnostic imaging of breast: Secondary | ICD-10-CM

## 2020-04-01 ENCOUNTER — Other Ambulatory Visit: Payer: Self-pay

## 2020-04-01 ENCOUNTER — Ambulatory Visit
Admission: RE | Admit: 2020-04-01 | Discharge: 2020-04-01 | Disposition: A | Discharge status: Home / Self Care | Payer: 59 | Source: Ambulatory Visit | Attending: Obstetrics & Gynecology | Admitting: Obstetrics & Gynecology

## 2020-04-01 ENCOUNTER — Other Ambulatory Visit: Payer: Self-pay | Admitting: Obstetrics & Gynecology

## 2020-04-01 DIAGNOSIS — R921 Mammographic calcification found on diagnostic imaging of breast: Secondary | ICD-10-CM

## 2020-05-27 ENCOUNTER — Other Ambulatory Visit: Payer: Self-pay

## 2020-05-27 ENCOUNTER — Ambulatory Visit (INDEPENDENT_AMBULATORY_CARE_PROVIDER_SITE_OTHER): Payer: 59 | Admitting: Family Medicine

## 2020-05-27 ENCOUNTER — Encounter: Payer: Self-pay | Admitting: Family Medicine

## 2020-05-27 VITALS — BP 106/72 | HR 84 | Ht 68.0 in | Wt 205.0 lb

## 2020-05-27 DIAGNOSIS — N61 Mastitis without abscess: Secondary | ICD-10-CM | POA: Diagnosis not present

## 2020-05-27 MED ORDER — SULFAMETHOXAZOLE-TRIMETHOPRIM 800-160 MG PO TABS
1.0000 | ORAL_TABLET | Freq: Two times a day (BID) | ORAL | 0 refills | Status: AC
Start: 2020-05-27 — End: 2020-06-06

## 2020-05-27 NOTE — Progress Notes (Signed)
Pt states left breast has been swollen since yesterday. Philipp Callegari l Rachelann Enloe, CMA

## 2020-05-27 NOTE — Progress Notes (Signed)
   Subjective:    Patient ID: Robin Kennedy, female    DOB: 12-07-1975, 44 y.o.   MRN: [id number]  HPI  Patient seen for left breast swelling and erythema that started yesterday and has continued today. Encompasses areola and upper breast. Tender to palpation. This hasn't happened before. No fevers, chills.  Review of Systems     Objective:   Physical Exam Vitals reviewed. Exam conducted with a chaperone present.  Constitutional:      Appearance: Normal appearance.  Chest:    Neurological:     Mental Status: She is alert.        Assessment & Plan:  1. Mastitis, left, acute Bactrim BID x 10 days. Return next week - check [place] if not improving.

## 2020-06-01 ENCOUNTER — Ambulatory Visit (INDEPENDENT_AMBULATORY_CARE_PROVIDER_SITE_OTHER): Payer: 59 | Admitting: Obstetrics & Gynecology

## 2020-06-01 ENCOUNTER — Encounter: Payer: Self-pay | Admitting: Obstetrics & Gynecology

## 2020-06-01 ENCOUNTER — Other Ambulatory Visit: Payer: Self-pay

## 2020-06-01 ENCOUNTER — Other Ambulatory Visit: Payer: Self-pay | Admitting: Obstetrics & Gynecology

## 2020-06-01 VITALS — BP 109/68 | HR 80 | Wt 205.0 lb

## 2020-06-01 DIAGNOSIS — N644 Mastodynia: Secondary | ICD-10-CM

## 2020-06-01 DIAGNOSIS — N61 Mastitis without abscess: Secondary | ICD-10-CM

## 2020-06-01 DIAGNOSIS — N63 Unspecified lump in unspecified breast: Secondary | ICD-10-CM | POA: Diagnosis not present

## 2020-06-01 NOTE — Progress Notes (Signed)
Patient states that she she is still having pain in the left breast  And notes that it is still swollen.

## 2020-06-01 NOTE — Progress Notes (Signed)
History:  44 y.o. [id number] here today for follow up of breast swelling and pain.  Pt reports that about 6-7 days ago she notice swelling and 'heaviness' of her left breast. She was dx'd with mastitis and started on atbx. Pt denies trauma or manipulation of the breast. She denies pain at present and does report that after 5 days of atbx the breast is somewhat smaller but, it is still 'heavy' but, not painful. She denies nipple discharge.        The following portions of the patient's history were reviewed and updated as appropriate: allergies, current medications, past family history, past medical history, past social history, past surgical history and problem list.  Review of Systems:  Pertinent items are noted in HPI.    Objective:  Physical Exam Blood pressure 109/68, pulse 80, weight 205 lb (93 kg), last menstrual period 11/06/2016.  CONSTITUTIONAL: Well-developed, well-nourished female in no acute distress.  HENT:  Normocephalic, atraumatic EYES: Conjunctivae and EOM are normal. No scleral icterus.  NECK: Normal range of motion SKIN: Skin is warm and dry. No rash noted. Not diaphoretic.No pallor. Royal: Alert and oriented to person, place, and time. Normal coordination.   Breast exam: Left breast: peau d'orange changes to the skin especially of the areola. There is a firm area in the breast at the 11-1 o'clock region. There is no fluctuance, tenderness or erythema. NO nipple discharge. The left breast is larger than the right.    Labs and Imaging 04/01/2020 CLINICAL DATA:  Short-term follow-up for right breast calcifications, which were assessed on 09/24/2019. An attempt at stereotactic biopsy was performed on 10/02/2019, but the calcifications were too superficial, either in the dermis or immediate subdermal fat. Short-term follow-up was recommended.  EXAM: DIGITAL DIAGNOSTIC UNILATERAL RIGHT MAMMOGRAM WITH TOMO AND CAD  COMPARISON:  Previous exam(s).  ACR Breast Density  Category b: There are scattered areas of fibroglandular density.  FINDINGS: The small group of probably benign calcifications are unchanged, lying very superficial in the medial right breast.  There are no suspicious masses or new calcifications. There are no areas of architectural distortion.  Mammographic images were processed with CAD.  IMPRESSION: 1. Probably benign calcifications in the right breast, stable for 6 months. Additional short-term follow-up recommended.  RECOMMENDATION: Diagnostic mammography with right breast magnification views in 6 months.  I have discussed the findings and recommendations with the patient. If applicable, a reminder letter will be sent to the patient regarding the next appointment.  BI-RADS CATEGORY  3: Probably benign.   Assessment & Plan:  Breast enlargement and swelling. Pt has had a mammogram in Aug but, the findings were in the right breast and were birad 3. Although an infection has not bee ruled out, pt has no risk factors for mastitis and the usual s/sx of mastitis are absent. I am encouraged by the fact that the sx are slightly better after 5 days of atbx, I will have her go for imagining to r/o malignancy.   Keep Bactrim to complete 10 days F/u in 1 week Diagnostic mammogram on the left side. (may need breast MRI)  Total face-to-face time with patient was 25 min.  Greater than 50% was spent in counseling and coordination of care with the patient.   Tyeler Goedken L. Harraway-Smith, M.D., [name]

## 2020-06-02 ENCOUNTER — Ambulatory Visit
Admission: RE | Admit: 2020-06-02 | Discharge: 2020-06-02 | Disposition: A | Discharge status: Home / Self Care | Payer: 59 | Source: Ambulatory Visit | Attending: Obstetrics & Gynecology | Admitting: Obstetrics & Gynecology

## 2020-06-02 ENCOUNTER — Other Ambulatory Visit: Payer: Self-pay | Admitting: Obstetrics & Gynecology

## 2020-06-02 DIAGNOSIS — N61 Mastitis without abscess: Secondary | ICD-10-CM

## 2020-06-02 HISTORY — PX: BREAST BIOPSY: SHX20

## 2020-06-04 ENCOUNTER — Encounter: Payer: Self-pay | Admitting: *Deleted

## 2020-06-04 ENCOUNTER — Telehealth: Payer: Self-pay | Admitting: Obstetrics & Gynecology

## 2020-06-04 NOTE — Telephone Encounter (Signed)
TC to pt to check on her post dx of ductal breast cancer. All questions answered. Support given.  Robin Kennedy, M.D., [name]

## 2020-06-05 ENCOUNTER — Other Ambulatory Visit: Payer: Self-pay | Admitting: *Deleted

## 2020-06-05 ENCOUNTER — Encounter: Payer: Self-pay | Admitting: *Deleted

## 2020-06-05 ENCOUNTER — Telehealth: Payer: Self-pay | Admitting: Hematology and Oncology

## 2020-06-05 DIAGNOSIS — Z170 Estrogen receptor positive status [ER+]: Secondary | ICD-10-CM | POA: Insufficient documentation

## 2020-06-05 DIAGNOSIS — C50212 Malignant neoplasm of upper-inner quadrant of left female breast: Secondary | ICD-10-CM

## 2020-06-05 DIAGNOSIS — C801 Malignant (primary) neoplasm, unspecified: Secondary | ICD-10-CM

## 2020-06-05 HISTORY — DX: Malignant neoplasm of upper-inner quadrant of left female breast: Z17.0

## 2020-06-05 HISTORY — DX: Malignant neoplasm of upper-inner quadrant of left female breast: C50.212

## 2020-06-05 HISTORY — DX: Malignant (primary) neoplasm, unspecified: C80.1

## 2020-06-05 NOTE — Telephone Encounter (Signed)
Spoke to patient to confirm morning [facility] appointment for 10/27, explained surgeon's office will call, packet was emailed to patient at [email or web address].

## 2020-06-08 ENCOUNTER — Ambulatory Visit: Payer: 59 | Admitting: Obstetrics & Gynecology

## 2020-06-09 NOTE — Progress Notes (Signed)
Radiation Oncology         [phone] ________________________________  Initial Outpatient Consultation  Name: Robin Kennedy MRN: [id number]  Date: 06/10/2020  DOB: 10-26-1975  XT:[id number], No Pcp Per  [name], MD   REFERRING PHYSICIAN: [name], MD  DIAGNOSIS:    ICD-10-CM   1. Malignant neoplasm of upper-inner quadrant of left breast in female, estrogen receptor positive ([place])  C50.212    Z17.0     Cancer Staging Malignant neoplasm of upper-inner quadrant of left breast in female, estrogen receptor positive ([place]) Staging form: Breast, AJCC 8th Edition - Clinical stage from 06/10/2020: Stage IIB (cT2, cN1, cM0, G3, ER+, PR-, HER2+) - Unsigned  CHIEF COMPLAINT: Here to discuss management of left breast cancer  HISTORY OF PRESENT ILLNESS::Robin Kennedy is a 44 y.o. female who presented with left breast abnormality on the following imaging: diagnostic left mammogram of the left breast on the date of 06/02/2020.  Symptoms, if any, at that time, were: a one-week history of an inflamed, enlarged, and painful left breast. Ultrasound of breast on 06/02/2020 revealed imaging findings that were suspicious for inflammatory breast carcinoma. There was a bilobed, 3.5 cm hypoechoic irregular mass at the 11 o'clock position that was considered inflammatory versus a complex collection. There were also enlarged/thickened left axillary lymph nodes that could have been reactive but also metastatic. 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 ER status: 10% moderate; PR status: 0% negative; Her2 status: positive; Grade 3.  She reports she has not yet been vaccinated against COVID.   Re: breast changes, She reiterates that her symptoms came on rapidly - approximate one week history before work up.  PREVIOUS RADIATION THERAPY: No  PAST MEDICAL HISTORY:  has a past medical history of Diabetes mellitus  without complication ([place]) and SVD (spontaneous vaginal delivery).    PAST SURGICAL HISTORY: Past Surgical History:  Procedure Laterality Date  . CYSTOSCOPY N/A 12/20/2016   Procedure: CYSTOSCOPY;  Surgeon: Lavonia Drafts, MD;  Location: [facility];  Service: Gynecology;  Laterality: N/A;  . ROBOTIC ASSISTED TOTAL HYSTERECTOMY WITH BILATERAL SALPINGO OOPHERECTOMY Bilateral 12/20/2016   Procedure: ROBOTIC ASSISTED TOTAL HYSTERECTOMY WITH BILATERAL SALPINGO OOPHORECTOMY;  Surgeon: Lavonia Drafts, MD;  Location: [facility];  Service: Gynecology;  Laterality: Bilateral;  . TUBAL LIGATION    . WISDOM TOOTH EXTRACTION      FAMILY HISTORY: family history includes Asthma in her daughter; Diabetes in her brother; GER disease in her daughter; Hyperlipidemia in her maternal aunt; Sarcoidosis in her mother; Stroke in her maternal uncle and paternal grandmother.  SOCIAL HISTORY:  reports that she has never smoked. She has never used smokeless tobacco. She reports that she does not drink alcohol and does not use drugs.  ALLERGIES: Patient has no known allergies.  MEDICATIONS:  Current Outpatient Medications  Medication Sig Dispense Refill  . Fe Fum-FePoly-FA-Vit C-Vit B3 (INTEGRA F) 125-1 MG CAPS Take 1 capsule by mouth daily. (Patient not taking: Reported on 05/27/2020) 30 capsule 6  . glimepiride (AMARYL) 4 MG tablet Take 4 mg by mouth 2 (two) times daily.  6  . glucose blood (ONE TOUCH ULTRA TEST) test strip Use to check cbgs qd 100 each 11  . ibuprofen (ADVIL,MOTRIN) 600 MG tablet Take 1 tablet (600 mg total) by mouth every 6 (six) hours as needed (mild pain). (Patient not taking: Reported on 05/27/2020) 30 tablet 0  . JANUMET XR 50-1000 MG TB24 Take 1  tablet by mouth daily. (Patient not taking: Reported on 05/27/2020)  12  . lidocaine-prilocaine (EMLA) cream Apply to affected area once 30 g 3  . LORazepam (ATIVAN) 0.5 MG tablet Take 1 tablet (0.5 mg total) by mouth at bedtime as needed for  sleep. 30 tablet 1  . metFORMIN (GLUCOPHAGE-XR) 500 MG 24 hr tablet Take 1,000 mg by mouth 2 (two) times daily.  3  . ondansetron (ZOFRAN) 8 MG tablet Take 1 tablet (8 mg total) by mouth 2 (two) times daily as needed (Nausea or vomiting). Start on the third day after chemotherapy. 30 tablet 1  . prochlorperazine (COMPAZINE) 10 MG tablet Take 1 tablet (10 mg total) by mouth every 6 (six) hours as needed (Nausea or vomiting). 30 tablet 1   No current facility-administered medications for this encounter.    REVIEW OF SYSTEMS: As above   PHYSICAL EXAM:  vitals were not taken for this visit.   General: Alert and oriented, in no acute distress  Heart: Regular in rate and rhythm with no murmurs, rubs, or gallops. Chest: Clear to auscultation bilaterally, with no rhonchi, wheezes, or rales.  Psychiatric: Judgment and insight are intact. Affect is appropriate. Breasts: significant swelling of left breast, no erythema of skin.  Mass in [place] in upper left breast is hard to measure - seems at least 5cm on exam to palpation. No other palpable masses appreciated in the breasts or axillae appreciated.   ECOG = 0  0 - Asymptomatic (Fully active, able to carry on all predisease activities without restriction)  1 - Symptomatic but completely ambulatory (Restricted in physically strenuous activity but ambulatory and able to carry out work of a light or sedentary nature. For example, light housework, office work)  2 - Symptomatic, <50% in bed during the day (Ambulatory and capable of all self care but unable to carry out any work activities. Up and about more than 50% of waking hours)  3 - Symptomatic, >50% in bed, but not bedbound (Capable of only limited self-care, confined to bed or chair 50% or more of waking hours)  4 - Bedbound (Completely disabled. Cannot carry on any self-care. Totally confined to bed or chair)  5 - Death   Eustace Pen MM, Creech RH, Tormey DC, et al. [phone]). "Toxicity and response  criteria of the [facility]". [place] Oncol. 5 (6): 649-55   LABORATORY DATA:  Lab Results  Component Value Date   WBC 7.8 06/10/2020   HGB 12.1 06/10/2020   HCT 38.2 06/10/2020   MCV 84.9 06/10/2020   PLT 253 06/10/2020   CMP     Component Value Date/Time   NA 139 06/10/2020 0857   NA 138 07/18/2018 1632   K 4.0 06/10/2020 0857   CL 105 06/10/2020 0857   CO2 26 06/10/2020 0857   GLUCOSE 161 (H) 06/10/2020 0857   BUN 9 06/10/2020 0857   BUN 9 07/18/2018 1632   CREATININE 0.78 06/10/2020 0857   CREATININE 0.66 06/06/2012 1746   CALCIUM 9.6 06/10/2020 0857   PROT 6.6 06/10/2020 0857   PROT 6.9 07/18/2018 1632   ALBUMIN 3.7 06/10/2020 0857   ALBUMIN 4.6 07/18/2018 1632   AST 8 (L) 06/10/2020 0857   ALT 7 06/10/2020 0857   ALKPHOS 43 06/10/2020 0857   BILITOT 0.6 06/10/2020 0857   GFRNONAA >60 06/10/2020 0857   GFRAA 129 07/18/2018 1632         RADIOGRAPHY: US BREAST LTD UNI LEFT INC AXILLA  Addendum  Date: 06/02/2020   ADDENDUM REPORT: 06/02/2020 15:41 ADDENDUM: Patient was prescribed a 10 day course of Augmentin, 875 mg tablets, 1 by mouth twice daily. She was instructed to fill this prescription once she finished her current Bactrim course. If the biopsy reveals carcinoma, she will not need to fill this prescription or continue her Bactrim. Electronically Signed   By: Lajean Manes M.D.   On: 06/02/2020 15:41   Result Date: 06/02/2020 CLINICAL DATA:  Patient presents with an inflamed, enlarged left breast, which began approximately 1 week ago, relatively abruptly, at which time there was significant associated pain. She was given Bactrim antibiotic therapy by her clinician, of which she has taken approximately 7 days worth, with improvement, particularly in pain. She still has breast swelling and heaviness. EXAM: DIGITAL DIAGNOSTIC LEFT MAMMOGRAM WITH CAD AND TOMO ULTRASOUND LEFT BREAST COMPARISON:  Previous exam(s). ACR Breast Density Category  c: The breast tissue is heterogeneously dense, which may obscure small masses. FINDINGS: There is diffuse increased density in the breast, predominantly located in the retroareolar and upper outer quadrant, with associated trabecular thickening and significant anterior skin thickening. In the upper inner quadrant there is a bilobed mass versus 2 directly contiguous masses, with irregular margins. There are no other defined masses. There is no architectural distortion and there are no suspicious calcifications. Mammographic images were processed with CAD. On physical exam, the breast is enlarged and edematous with the skin anteriorly being thickened. Breast is firm to palpation anteriorly. There is no defined mass. Targeted ultrasound is performed, showing an irregular hypoechoic bilobed mass at 11 o'clock, 9 cm from the nipple, measuring 3.5 x 2.0 x 2.3 cm. Mass shows internal blood flow on color Doppler analysis. In the left axilla there are at least 3 abnormal lymph nodes, the largest with a cortical thickening 1.2 cm. IMPRESSION: 1. 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 There is a bilobed, 3.5 cm hypoechoic irregular mass at 11 o'clock, which could be inflammatory or even a complex collection. Enlarged/thickened left axillary lymph nodes may all be reactive, but could be metastatic. RECOMMENDATION: 1. Attempt at aspiration of bilobed mass versus complex collection. If this cannot be aspirated, then ultrasound-guided core needle biopsy will be performed along with ultrasound-guided core needle biopsy of 1 of the enlarged/abnormal left axillary lymph nodes. This will be performed today. I have discussed the findings and recommendations with the patient. If applicable, a reminder letter will be sent to the patient regarding the next appointment. BI-RADS CATEGORY  4: Suspicious. Electronically Signed: By: Lajean Manes M.D. On: 06/02/2020  15:33   MM DIAG BREAST TOMO UNI LEFT  Addendum Date: 06/02/2020   ADDENDUM REPORT: 06/02/2020 15:41 ADDENDUM: Patient was prescribed a 10 day course of Augmentin, 875 mg tablets, 1 by mouth twice daily. She was instructed to fill this prescription once she finished her current Bactrim course. If the biopsy reveals carcinoma, she will not need to fill this prescription or continue her Bactrim. Electronically Signed   By: Lajean Manes M.D.   On: 06/02/2020 15:41   Result Date: 06/02/2020 CLINICAL DATA:  Patient presents with an inflamed, enlarged left breast, which began approximately 1 week ago, relatively abruptly, at which time there was significant associated pain. She was given Bactrim antibiotic therapy by her clinician, of which she has taken approximately 7 days worth, with improvement, particularly in pain. She still has breast swelling and heaviness. EXAM: DIGITAL DIAGNOSTIC LEFT MAMMOGRAM  WITH CAD AND TOMO ULTRASOUND LEFT BREAST COMPARISON:  Previous exam(s). ACR Breast Density Category c: The breast tissue is heterogeneously dense, which may obscure small masses. FINDINGS: There is diffuse increased density in the breast, predominantly located in the retroareolar and upper outer quadrant, with associated trabecular thickening and significant anterior skin thickening. In the upper inner quadrant there is a bilobed mass versus 2 directly contiguous masses, with irregular margins. There are no other defined masses. There is no architectural distortion and there are no suspicious calcifications. Mammographic images were processed with CAD. On physical exam, the breast is enlarged and edematous with the skin anteriorly being thickened. Breast is firm to palpation anteriorly. There is no defined mass. Targeted ultrasound is performed, showing an irregular hypoechoic bilobed mass at 11 o'clock, 9 cm from the nipple, measuring 3.5 x 2.0 x 2.3 cm. Mass shows internal blood flow on color Doppler analysis.  In the left axilla there are at least 3 abnormal lymph nodes, the largest with a cortical thickening 1.2 cm. IMPRESSION: 1. 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 There is a bilobed, 3.5 cm hypoechoic irregular mass at 11 o'clock, which could be inflammatory or even a complex collection. Enlarged/thickened left axillary lymph nodes may all be reactive, but could be metastatic. RECOMMENDATION: 1. Attempt at aspiration of bilobed mass versus complex collection. If this cannot be aspirated, then ultrasound-guided core needle biopsy will be performed along with ultrasound-guided core needle biopsy of 1 of the enlarged/abnormal left axillary lymph nodes. This will be performed today. I have discussed the findings and recommendations with the patient. If applicable, a reminder letter will be sent to the patient regarding the next appointment. BI-RADS CATEGORY  4: Suspicious. Electronically Signed: By: Lajean Manes M.D. On: 06/02/2020 15:33   [place] AXILLARY NODE CORE BIOPSY LEFT  Addendum Date: 06/03/2020   ADDENDUM REPORT: 06/03/2020 13:19 ADDENDUM: Pathology revealed GRADE III INVASIVE DUCTAL CARCINOMA of the LEFT breast, 11 o'clock, 9 cmfn. This was found to be concordant by Dr. Lajean Manes. Pathology revealed INVASIVE DUCTAL CARCINOMA of the LEFT axilla, no residual nodal tissue was identified in the biopsy material; this may represent an entirely replaced metastatic lymph node. This was found to be concordant by Dr. Lajean Manes. Pathology results were discussed with the patient by telephone. The patient reported doing well after the biopsies with tenderness and minimal bleeding at the sites. Post biopsy instructions and care were reviewed and questions were answered. The patient was encouraged to call [facility] for any additional concerns. My direct phone number was provided. The patient was referred to  The [facility] on June 10, 2020. Recommendation for a bilateral breast MRI for further evaluation of extent of disease given heterogeneously dense breasts, prior to scheduling a stereotatic guided biopsy of the RIGHT breast, probably benign, stable calcifications. The patient was instructed to discontinue the use of any antibiotics she is currently taking for her breast. Pathology results reported by Terie Purser, RN on 06/03/2020. Electronically Signed   By: Lajean Manes M.D.   On: 06/03/2020 13:19   Result Date: 06/03/2020 CLINICAL DATA:  Patient presented with an inflamed, enlarged left breast, which began approximately 1 week ago, relatively abruptly, at which time there was significant associated pain. She was given Bactrim antibiotic therapy by her clinician of which she has taken approximately 7 days worth, with improvement,  particularly in pain. She still has breast swelling and heaviness. On imaging a bilobed mass was detected at 10 o'clock. The patient also had at least 3 enlarged lymph nodes in the left axilla. An initial attempt at aspiration was performed which was unsuccessful. EXAM: ULTRASOUND GUIDED LEFT BREAST CORE NEEDLE BIOPSY ULTRASOUND GUIDED LEFT LEFT AXILLARY LYMPH NODE CORE NEEDLE BIOPSY COMPARISON:  Previous exam(s). PROCEDURE: I met with the patient and we discussed the procedure of ultrasound-guided biopsy, including benefits and alternatives. We discussed the high likelihood of a successful procedure. We discussed the risks of the procedure, including infection, bleeding, tissue injury, clip migration, and inadequate sampling. Informed written consent was given. The usual time-out protocol was performed immediately prior to the procedure. Lesion #1: 10 o'clock position left breast mass. Lesion quadrant: Upper inner quadrant Using sterile technique and 1% Lidocaine as local anesthetic, under direct ultrasound  visualization, a 12 gauge spring-loaded device was used to perform biopsy of the bilobed 10 o'clock position left breast mass using an inferior approach. At the conclusion of the procedure ribbon shaped tissue marker clip was deployed into the biopsy cavity. Lesion #2: Abnormal left axillary lymph node. Lesion location: Left axilla Using sterile technique and 1% Lidocaine as local anesthetic, under direct ultrasound visualization, a 14 gauge spring-loaded device was used to perform biopsy of the largest of the left axilla abnormal lymph nodes using an inferior approach. At the conclusion of the procedure [facility] tissue marker clip was deployed into the biopsy cavity Follow up 2 view mammogram was performed and dictated separately. IMPRESSION: Ultrasound guided biopsy of a left breast mass and an abnormal left axillary lymph nodes. No apparent complications. Electronically Signed: By: Lajean Manes M.D. On: 06/02/2020 15:21   MM CLIP PLACEMENT LEFT  Result Date: 06/02/2020 CLINICAL DATA:  Evaluate post biopsy marker clip placement following ultrasound-guided core needle biopsy of a left breast mass and an abnormal left axillary lymph node. EXAM: DIAGNOSTIC LEFT MAMMOGRAM POST ULTRASOUND BIOPSY COMPARISON:  Previous exam(s). FINDINGS: Mammographic images were obtained following ultrasound guided biopsy of a left breast mass and an abnormal left axillary lymph node. The ribbon shaped biopsy clip lies within the bilobed upper inner quadrant left breast mass. The [facility] biopsy clip lies within enlarged left axillary lymph node. IMPRESSION: Appropriate positioning of the ribbon shaped biopsy marking clip and the HydroMARK biopsy clip at the site of biopsy in the upper inner left breast and left axilla respectively. Final Assessment: Post Procedure Mammograms for Marker Placement Electronically Signed   By: Lajean Manes M.D.   On: 06/02/2020 15:24   [place] LT BREAST BX W LOC DEV 1ST LESION IMG BX SPEC US  GUIDE  Addendum Date: 06/03/2020   ADDENDUM REPORT: 06/03/2020 13:19 ADDENDUM: Pathology revealed GRADE III INVASIVE DUCTAL CARCINOMA of the LEFT breast, 11 o'clock, 9 cmfn. This was found to be concordant by Dr. Lajean Manes. Pathology revealed INVASIVE DUCTAL CARCINOMA of the LEFT axilla, no residual nodal tissue was identified in the biopsy material; this may represent an entirely replaced metastatic lymph node. This was found to be concordant by Dr. Lajean Manes. Pathology results were discussed with the patient by telephone. The patient reported doing well after the biopsies with tenderness and minimal bleeding at the sites. Post biopsy instructions and care were reviewed and questions were answered. The patient was encouraged to call [facility] for any additional concerns. My direct phone number was provided. The patient was referred to [facility] on June 10, 2020. Recommendation for a bilateral breast MRI for further evaluation of extent of disease given heterogeneously dense breasts, prior to scheduling a stereotatic guided biopsy of the RIGHT breast, probably benign, stable calcifications. The patient was instructed to discontinue the use of any antibiotics she is currently taking for her breast. Pathology results reported by Terie Purser, RN on 06/03/2020. Electronically Signed   By: Lajean Manes M.D.   On: 06/03/2020 13:19   Result Date: 06/03/2020 CLINICAL DATA:  Patient presented with an inflamed, enlarged left breast, which began approximately 1 week ago, relatively abruptly, at which time there was significant associated pain. She was given Bactrim antibiotic therapy by her clinician of which she has taken approximately 7 days worth, with improvement, particularly in pain. She still has breast swelling and heaviness. On imaging a bilobed mass was detected at 10 o'clock. The patient also  had at least 3 enlarged lymph nodes in the left axilla. An initial attempt at aspiration was performed which was unsuccessful. EXAM: ULTRASOUND GUIDED LEFT BREAST CORE NEEDLE BIOPSY ULTRASOUND GUIDED LEFT LEFT AXILLARY LYMPH NODE CORE NEEDLE BIOPSY COMPARISON:  Previous exam(s). PROCEDURE: I met with the patient and we discussed the procedure of ultrasound-guided biopsy, including benefits and alternatives. We discussed the high likelihood of a successful procedure. We discussed the risks of the procedure, including infection, bleeding, tissue injury, clip migration, and inadequate sampling. Informed written consent was given. The usual time-out protocol was performed immediately prior to the procedure. Lesion #1: 10 o'clock position left breast mass. Lesion quadrant: Upper inner quadrant Using sterile technique and 1% Lidocaine as local anesthetic, under direct ultrasound visualization, a 12 gauge spring-loaded device was used to perform biopsy of the bilobed 10 o'clock position left breast mass using an inferior approach. At the conclusion of the procedure ribbon shaped tissue marker clip was deployed into the biopsy cavity. Lesion #2: Abnormal left axillary lymph node. Lesion location: Left axilla Using sterile technique and 1% Lidocaine as local anesthetic, under direct ultrasound visualization, a 14 gauge spring-loaded device was used to perform biopsy of the largest of the left axilla abnormal lymph nodes using an inferior approach. At the conclusion of the procedure [facility] tissue marker clip was deployed into the biopsy cavity Follow up 2 view mammogram was performed and dictated separately. IMPRESSION: Ultrasound guided biopsy of a left breast mass and an abnormal left axillary lymph nodes. No apparent complications. Electronically Signed: By: Lajean Manes M.D. On: 06/02/2020 15:21      IMPRESSION/PLAN: Left breast cancer, locally advanced.  It was a pleasure meeting the patient today. We discussed  the risks, benefits, and side effects of radiotherapy. I recommend radiotherapy to the left breast and regional nodes (she understands chest wall would be treated in the setting of mastectomy, if pursued),  to reduce her risk of locoregional recurrence by 2/3.  We discussed that radiation would take approximately 6-7 weeks to complete and that I would give the patient a few weeks to heal following surgery before starting treatment planning.  Anticipate chemotherapy to precede surgery. We spoke about acute effects including skin irritation and fatigue as well as much less common late effects including internal organ injury or irritation. We spoke about the latest technology that is used to minimize the risk of late effects for patients undergoing radiotherapy to the breast or chest wall. No guarantees of treatment were given. The patient is enthusiastic about proceeding with treatment. I look  forward to participating in the patient's care.  I will await her referral back to me for postoperative follow-up and eventual CT simulation/treatment planning.  We discussed measures to reduce the risk of infection during the COVID-19 pandemic. She has not been vaccinated yet and I recommended she strongly consider this. She will pursue this today at the [facility] before leaving.  On date of service, in total, I spent 45 minutes on this encounter. Patient was seen in person.   __________________________________________   Eppie Gibson, MD  This document serves as a record of services personally performed by Eppie Gibson, MD. It was created on his behalf by Clerance Lav, a trained medical scribe. The creation of this record is based on the scribe's personal observations and the provider's statements to them. This document has been checked and approved by the attending provider.

## 2020-06-09 NOTE — Progress Notes (Signed)
[place] NOTE  Patient Care Team: Patient, No Pcp Per as PCP - General (General Practice) Shawnee Knapp, MD (Family Medicine) Mauro Kaufmann, RN as Oncology Nurse Navigator Rockwell Germany, RN as Oncology Nurse Navigator [name], MD as Consulting Physician (General Surgery) Nicholas Lose, MD as Consulting Physician (Hematology and Oncology) Eppie Gibson, MD as Attending Physician (Radiation Oncology)  CHIEF COMPLAINTS/PURPOSE OF CONSULTATION:  Newly diagnosed breast cancer  HISTORY OF PRESENTING ILLNESS:  Robin Kennedy 44 y.o. female is here because of recent diagnosis of invasive ductal carcinoma of the left breast. Screening mammogram on 09/16/19 showed right breast calcifications. Biopsy was attempted on 09/24/19 but calcifications were too superficial and short term follow-up was recommended. Right breast diagnostic mammogram on 04/01/20 showed probably benign, stable right breast calcifications. Patient reported an inflamed, enlarged left breast x1 week that began abruptly with significant pain and she was placed on Bactrim. Diagnostic mammogram of the left breast on 06/02/20 showed a 3.5cm mass at the 11 o'clock position and at least 3 abnormal left axillary lymph nodes, the largest with cortical thickening 1.2cm. Biopsy on 06/02/20 showed invasive ductal carcinoma, grade 3 in the breast and axilla, HER-2 positive (3+), ER+ 20%, PR- 0%, Ki67 60%. She presents to the clinic today for initial evaluation and discussion of treatment options.   I reviewed her records extensively and collaborated the history with the patient.  SUMMARY OF ONCOLOGIC HISTORY: Oncology History  Malignant neoplasm of upper-inner quadrant of left breast in female, estrogen receptor positive ([place])  06/05/2020 Initial Diagnosis   06/05/2020: Enlarging left breast with pain and diffuse skin thickening: Mammogram revealed 3.5 cm mass 11 o'clock position with 3 abnormal lymph nodes:  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 Right breast benign-appearing calcifications previous stereotactic biopsy was attempted but it was too superficial and felt to be benign.     MEDICAL HISTORY:  Past Medical History:  Diagnosis Date  . Diabetes mellitus without complication ([place])   . SVD (spontaneous vaginal delivery)    X 3    SURGICAL HISTORY: Past Surgical History:  Procedure Laterality Date  . CYSTOSCOPY N/A 12/20/2016   Procedure: CYSTOSCOPY;  Surgeon: Lavonia Drafts, MD;  Location: [facility];  Service: Gynecology;  Laterality: N/A;  . ROBOTIC ASSISTED TOTAL HYSTERECTOMY WITH BILATERAL SALPINGO OOPHERECTOMY Bilateral 12/20/2016   Procedure: ROBOTIC ASSISTED TOTAL HYSTERECTOMY WITH BILATERAL SALPINGO OOPHORECTOMY;  Surgeon: Lavonia Drafts, MD;  Location: [facility];  Service: Gynecology;  Laterality: Bilateral;  . TUBAL LIGATION    . WISDOM TOOTH EXTRACTION      SOCIAL HISTORY: Social History   Socioeconomic History  . Marital status: Married    Spouse name: Not on file  . Number of children: Not on file  . Years of education: Not on file  . Highest education level: Not on file  Occupational History  . Not on file  Tobacco Use  . Smoking status: Never Smoker  . Smokeless tobacco: Never Used  Vaping Use  . Vaping Use: Never used  Substance and Sexual Activity  . Alcohol use: No  . Drug use: No  . Sexual activity: Yes    Birth control/protection: Surgical  Other Topics Concern  . Not on file  Social History Narrative   ** Merged History Encounter **       Social Determinants of Health   Financial Resource Strain:   . Difficulty of Paying Living Expenses: Not  on file  Food Insecurity:   . Worried About Charity fundraiser in the Last Year: Not on file  . Ran Out of Food in the Last Year: Not on file  Transportation Needs:   . Lack of Transportation (Medical): Not on file  . Lack of Transportation  (Non-Medical): Not on file  Physical Activity:   . Days of Exercise per Week: Not on file  . Minutes of Exercise per Session: Not on file  Stress:   . Feeling of Stress : Not on file  Social Connections:   . Frequency of Communication with Friends and Family: Not on file  . Frequency of Social Gatherings with Friends and Family: Not on file  . Attends Religious Services: Not on file  . Active Member of Clubs or Organizations: Not on file  . Attends Archivist Meetings: Not on file  . Marital Status: Not on file  Intimate Partner Violence:   . Fear of Current or Ex-Partner: Not on file  . Emotionally Abused: Not on file  . Physically Abused: Not on file  . Sexually Abused: Not on file    FAMILY HISTORY: Family History  Problem Relation Age of Onset  . Diabetes Brother   . Asthma Daughter   . GER disease Daughter   . Stroke Paternal Grandmother   . Sarcoidosis Mother   . Hyperlipidemia Maternal Aunt   . Stroke Maternal Uncle     ALLERGIES:  has No Known Allergies.  MEDICATIONS:  Current Outpatient Medications  Medication Sig Dispense Refill  . Fe Fum-FePoly-FA-Vit C-Vit B3 (INTEGRA F) 125-1 MG CAPS Take 1 capsule by mouth daily. (Patient not taking: Reported on 05/27/2020) 30 capsule 6  . glimepiride (AMARYL) 4 MG tablet Take 4 mg by mouth 2 (two) times daily.  6  . glucose blood (ONE TOUCH ULTRA TEST) test strip Use to check cbgs qd 100 each 11  . ibuprofen (ADVIL,MOTRIN) 600 MG tablet Take 1 tablet (600 mg total) by mouth every 6 (six) hours as needed (mild pain). (Patient not taking: Reported on 05/27/2020) 30 tablet 0  . JANUMET XR 50-1000 MG TB24 Take 1 tablet by mouth daily. (Patient not taking: Reported on 05/27/2020)  12  . metFORMIN (GLUCOPHAGE-XR) 500 MG 24 hr tablet Take 1,000 mg by mouth 2 (two) times daily.  3   No current facility-administered medications for this visit.    REVIEW OF SYSTEMS:      Breast: Left breast enlargement, tenderness, and  skin thickening  All other systems were reviewed with the patient and are negative.  PHYSICAL EXAMINATION: ECOG PERFORMANCE STATUS: 1 - Symptomatic but completely ambulatory  Vitals:   06/10/20 0923  BP: 127/71  Pulse: 92  Resp: 18  Temp: 98.1 F (36.7 C)  SpO2: 98%   Filed Weights   06/10/20 0923  Weight: 204 lb 14.4 oz (92.9 kg)      BREAST: Large palpable mass in the left breast with lymphedema and a palpable left axillary lymph nodes.    (exam performed in the presence of a chaperone)   LABORATORY DATA:  I have reviewed the data as listed Lab Results  Component Value Date   WBC 7.8 06/10/2020   HGB 12.1 06/10/2020   HCT 38.2 06/10/2020   MCV 84.9 06/10/2020   PLT 253 06/10/2020   Lab Results  Component Value Date   NA 139 06/10/2020   K 4.0 06/10/2020   CL 105 06/10/2020   CO2 26 06/10/2020  RADIOGRAPHIC STUDIES: I have personally reviewed the radiological reports and agreed with the findings in the report.  ASSESSMENT AND PLAN:  Malignant neoplasm of upper-inner quadrant of left breast in female, estrogen receptor positive ([place]) 06/05/2020: Enlarging left breast with pain and diffuse skin thickening: Mammogram revealed 3.5 cm mass 11 o'clock position with 3 abnormal lymph nodes: 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 Right breast benign-appearing calcifications previous stereotactic biopsy was attempted but it was too superficial and felt to be benign.  Pathology and radiology counseling: Discussed with the patient, the details of pathology including the type of breast cancer,the clinical staging, the significance of ER, PR and HER-2/neu receptors and the implications for treatment. After reviewing the pathology in detail, we proceeded to discuss the different treatment options between surgery, radiation, chemotherapy, antiestrogen therapies.  Recommendation based on multidisciplinary tumor board: 1. Neoadjuvant  chemotherapy with TCH Perjeta 6 cycles followed by Herceptin Perjeta versus Kadcyla maintenance for 1 year 2. Followed by breast conserving surgery if possible with sentinel lymph node study 3. Followed by adjuvant radiation therapy if patient had lumpectomy  Chemotherapy Counseling: I discussed the risks and benefits of chemotherapy including the risks of nausea/ vomiting, risk of infection from low WBC count, fatigue due to chemo or anemia, bruising or bleeding due to low platelets, mouth sores, loss/ change in taste and decreased appetite. Liver and kidney function will be monitored through out chemotherapy as abnormalities in liver and kidney function may be a side effect of treatment. Cardiac dysfunction due to Herceptin and Perjeta were discussed in detail. Risk of permanent bone marrow dysfunction due to chemo were also discussed.  Plan: 1. Port placement 2. Echocardiogram 3. Chemotherapy class 4. Breast MRI 5. CT chest abdomen pelvis and bone scan for staging  Return to clinic in 2 weeks to start chemotherapy.     All questions were answered. The patient knows to call the clinic with any problems, questions or concerns.   Rulon Eisenmenger, MD, MPH 06/10/2020    I, Molly Dorshimer, am acting as scribe for Nicholas Lose, MD.  I have reviewed the above documentation for accuracy and completeness, and I agree with the above.

## 2020-06-10 ENCOUNTER — Encounter: Payer: Self-pay | Admitting: *Deleted

## 2020-06-10 ENCOUNTER — Ambulatory Visit (HOSPITAL_BASED_OUTPATIENT_CLINIC_OR_DEPARTMENT_OTHER): Payer: 59 | Admitting: Genetic Counselor

## 2020-06-10 ENCOUNTER — Inpatient Hospital Stay: Payer: 59

## 2020-06-10 ENCOUNTER — Other Ambulatory Visit: Payer: Self-pay

## 2020-06-10 ENCOUNTER — Inpatient Hospital Stay: Payer: 59 | Attending: Hematology and Oncology | Admitting: Hematology and Oncology

## 2020-06-10 ENCOUNTER — Ambulatory Visit: Payer: 59 | Admitting: Physical Therapy

## 2020-06-10 ENCOUNTER — Ambulatory Visit
Admission: RE | Admit: 2020-06-10 | Discharge: 2020-06-10 | Disposition: A | Discharge status: Home / Self Care | Payer: 59 | Source: Ambulatory Visit | Attending: Radiation Oncology | Admitting: Radiation Oncology

## 2020-06-10 ENCOUNTER — Inpatient Hospital Stay: Payer: 59 | Admitting: Licensed Clinical Social Worker

## 2020-06-10 ENCOUNTER — Encounter: Payer: Self-pay | Admitting: Physical Therapy

## 2020-06-10 ENCOUNTER — Other Ambulatory Visit: Payer: Self-pay | Admitting: General Surgery

## 2020-06-10 VITALS — BP 127/71 | HR 92 | Temp 98.1°F | Resp 18 | Ht 68.0 in | Wt 204.9 lb

## 2020-06-10 DIAGNOSIS — Z170 Estrogen receptor positive status [ER+]: Secondary | ICD-10-CM

## 2020-06-10 DIAGNOSIS — C50212 Malignant neoplasm of upper-inner quadrant of left female breast: Secondary | ICD-10-CM

## 2020-06-10 DIAGNOSIS — Z23 Encounter for immunization: Secondary | ICD-10-CM

## 2020-06-10 DIAGNOSIS — R293 Abnormal posture: Secondary | ICD-10-CM

## 2020-06-10 LAB — CMP (CANCER CENTER ONLY)
ALT: 7 U/L (ref 0–44)
AST: 8 U/L — ABNORMAL LOW (ref 15–41)
Albumin: 3.7 g/dL (ref 3.5–5.0)
Alkaline Phosphatase: 43 U/L (ref 38–126)
Anion gap: 8 (ref 5–15)
BUN: 9 mg/dL (ref 6–20)
CO2: 26 mmol/L (ref 22–32)
Calcium: 9.6 mg/dL (ref 8.9–10.3)
Chloride: 105 mmol/L (ref 98–111)
Creatinine: 0.78 mg/dL (ref 0.44–1.00)
GFR, Estimated: >60 mL/min (ref >60)
Glucose, Bld: 161 mg/dL — ABNORMAL HIGH (ref 70–99)
Potassium: 4 mmol/L (ref 3.5–5.1)
Sodium: 139 mmol/L (ref 135–145)
Total Bilirubin: 0.6 mg/dL (ref 0.3–1.2)
Total Protein: 6.6 g/dL (ref 6.5–8.1)

## 2020-06-10 LAB — CBC WITH DIFFERENTIAL (CANCER CENTER ONLY)
Abs Immature Granulocytes: 0.01 10*3/uL (ref 0.00–0.07)
Basophils Absolute: 0 10*3/uL (ref 0.0–0.1)
Basophils Relative: 1 %
Eosinophils Absolute: 0.1 10*3/uL (ref 0.0–0.5)
Eosinophils Relative: 2 %
HCT: 38.2 % (ref 36.0–46.0)
Hemoglobin: 12.1 g/dL (ref 12.0–15.0)
Immature Granulocytes: 0 %
Lymphocytes Relative: 35 %
Lymphs Abs: 2.7 10*3/uL (ref 0.7–4.0)
MCH: 26.9 pg (ref 26.0–34.0)
MCHC: 31.7 g/dL (ref 30.0–36.0)
MCV: 84.9 fL (ref 80.0–100.0)
Monocytes Absolute: 0.5 10*3/uL (ref 0.1–1.0)
Monocytes Relative: 7 %
Neutro Abs: 4.4 10*3/uL (ref 1.7–7.7)
Neutrophils Relative %: 55 %
Platelet Count: 253 10*3/uL (ref 150–400)
RBC: 4.5 MIL/uL (ref 3.87–5.11)
RDW: 13.5 % (ref 11.5–15.5)
WBC Count: 7.8 10*3/uL (ref 4.0–10.5)
nRBC: 0 % (ref 0.0–0.2)

## 2020-06-10 LAB — GENETIC SCREENING ORDER

## 2020-06-10 MED ORDER — PROCHLORPERAZINE MALEATE 10 MG PO TABS: 10.0000 mg | ORAL_TABLET | Freq: Four times a day (QID) | ORAL | 1 refills | Status: DC | PRN

## 2020-06-10 MED ORDER — LIDOCAINE-PRILOCAINE 2.5-2.5 % EX CREA: TOPICAL_CREAM | CUTANEOUS | 3 refills | Status: DC

## 2020-06-10 MED ORDER — ONDANSETRON HCL 8 MG PO TABS: 8.0000 mg | ORAL_TABLET | Freq: Two times a day (BID) | ORAL | 1 refills | Status: DC | PRN

## 2020-06-10 MED ORDER — LORAZEPAM 0.5 MG PO TABS: 0.5000 mg | ORAL_TABLET | Freq: Every evening | ORAL | 1 refills | Status: DC | PRN

## 2020-06-10 NOTE — Progress Notes (Signed)
   Covid-19 Vaccination Clinic  Name:  Robin Kennedy    MRN: [id number] DOB: 06/02/1976  06/10/2020  Robin Kennedy was observed post Covid-19 immunization for 30 minutes based on pre-vaccination screening without incident. She was provided with Vaccine Information Sheet and instruction to access the V-Safe system.   Robin Kennedy was instructed to call 911 with any severe reactions post vaccine: Marland Kitchen Difficulty breathing  . Swelling of face and throat  . A fast heartbeat  . A bad rash all over body  . Dizziness and weakness   Immunizations Administered    Name Date Dose VIS Date Route   Pfizer COVID-19 Vaccine 06/10/2020 12:41 PM 0.3 mL 10/09/2018 Intramuscular   Manufacturer: [employer]   Lot: [id number]   [place]: [id number]

## 2020-06-10 NOTE — Assessment & Plan Note (Signed)
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 Right breast benign-appearing calcifications previous stereotactic biopsy was attempted but it was too superficial and felt to be benign.  Pathology and radiology counseling: Discussed with the patient, the details of pathology including the type of breast cancer,the clinical staging, the significance of ER, PR and HER-2/neu receptors and the implications for treatment. After reviewing the pathology in detail, we proceeded to discuss the different treatment options between surgery, radiation, chemotherapy, antiestrogen therapies.  Recommendation based on multidisciplinary tumor board: 1. Neoadjuvant chemotherapy with TCH Perjeta 6 cycles followed by Herceptin Perjeta versus Kadcyla maintenance for 1 year 2. Followed by breast conserving surgery if possible with sentinel lymph node study 3. Followed by adjuvant radiation therapy if patient had lumpectomy  Chemotherapy Counseling: I discussed the risks and benefits of chemotherapy including the risks of nausea/ vomiting, risk of infection from low WBC count, fatigue due to chemo or anemia, bruising or bleeding due to low platelets, mouth sores, loss/ change in taste and decreased appetite. Liver and kidney function will be monitored through out chemotherapy as abnormalities in liver and kidney function may be a side effect of treatment. Cardiac dysfunction due to Herceptin and Perjeta were discussed in detail. Risk of permanent bone marrow dysfunction due to chemo were also discussed.  Plan: 1. Port placement 2. Echocardiogram 3. Chemotherapy class 4. Breast MRI 5. CT chest abdomen pelvis and bone scan for staging  Return to clinic in 2 weeks to start chemotherapy.

## 2020-06-10 NOTE — Patient Instructions (Signed)

## 2020-06-10 NOTE — Progress Notes (Signed)
REFERRING PROVIDER: Nicholas Lose, MD [place],  [place] [id number]  PRIMARY PROVIDER:  None listed   PRIMARY REASON FOR VISIT:  1. Malignant neoplasm of upper-inner quadrant of left breast in female, estrogen receptor positive ([place])    I connected with [name] on 06/10/2020 at 11:50am EDT by Webex video conference and verified that I am speaking with the correct person using two identifiers.   Patient location: [facility]  Provider location: [place] OF PRESENT ILLNESS:   [name], a 44 y.o. female, was seen for a [place] cancer genetics consultation during breast multidisciplinary clinic at the request of Dr. Lindi Adie due to a personal history of breast cancer.  [name] presents to clinic today with her husband to discuss the possibility of a hereditary predisposition to cancer, to discuss genetic testing, and to further clarify her future cancer risks, as well as potential cancer risks for family members.   In October 2021, at the age of 50, [name] was diagnosed with invasive ductal carcinoma of the left breast. The preliminary treatment plan includes neoadjuvant chemotherapy and surgery.   CANCER HISTORY:  Oncology History  Malignant neoplasm of upper-inner quadrant of left breast in female, estrogen receptor positive ([place])  06/05/2020 Initial Diagnosis   06/05/2020: Enlarging left breast with pain and diffuse skin thickening: Mammogram revealed 3.5 cm mass 11 o'clock position with 3 abnormal lymph nodes: 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 Right breast benign-appearing calcifications previous stereotactic biopsy was attempted but it was too superficial and felt to be benign.   06/23/2020 -  Chemotherapy   The patient had palonosetron (ALOXI) injection 0.25 mg, 0.25 mg, Intravenous,  Once, 0 of 6 cycles pegfilgrastim-jmdb (FULPHILA) injection 6 mg, 6 mg, Subcutaneous,   Once, 0 of 6 cycles CARBOplatin (PARAPLATIN) 700 mg in sodium chloride 0.9 % 250 mL chemo infusion, 700 mg (100 % of original dose 700 mg), Intravenous,  Once, 0 of 6 cycles Dose modification: 700 mg (original dose 700 mg, Cycle 1) DOCEtaxel (TAXOTERE) 160 mg in sodium chloride 0.9 % 250 mL chemo infusion, 75 mg/m2 = 160 mg, Intravenous,  Once, 0 of 6 cycles fosaprepitant (EMEND) 150 mg in sodium chloride 0.9 % 145 mL IVPB, 150 mg, Intravenous,  Once, 0 of 6 cycles pertuzumab (PERJETA) 420 mg in sodium chloride 0.9 % 250 mL chemo infusion, 420 mg (100 % of original dose 420 mg), Intravenous, Once, 0 of 6 cycles Dose modification: 420 mg (original dose 420 mg, Cycle 1, Reason: Provider Judgment) trastuzumab-dkst (OGIVRI) 735 mg in sodium chloride 0.9 % 250 mL chemo infusion, 8 mg/kg = 735 mg, Intravenous,  Once, 0 of 6 cycles  for chemotherapy treatment.       RISK FACTORS:  Menarche was at age 11.  First live birth at age 16.  OCP use for approximately 0 years.  Ovaries intact: no; BSO in 2018 Hysterectomy: yes; TAH in 2018 Menopausal status: last period in 2018 HRT use: 0 years. Colonoscopy: no; not examined. Mammogram within the last year: yes. Up to date with pelvic exams: yes.  Past Medical History:  Diagnosis Date  . Diabetes mellitus without complication ([place])   . SVD (spontaneous vaginal delivery)    X 3    Past Surgical History:  Procedure Laterality Date  . CYSTOSCOPY N/A 12/20/2016   Procedure: CYSTOSCOPY;  Surgeon: Lavonia Drafts, MD;  Location: [facility];  Service: Gynecology;  Laterality: N/A;  . ROBOTIC ASSISTED TOTAL HYSTERECTOMY WITH BILATERAL SALPINGO OOPHERECTOMY Bilateral 12/20/2016   Procedure: ROBOTIC ASSISTED TOTAL HYSTERECTOMY WITH BILATERAL SALPINGO OOPHORECTOMY;  Surgeon: Lavonia Drafts, MD;  Location: [facility];  Service: Gynecology;  Laterality: Bilateral;  . TUBAL LIGATION    . WISDOM TOOTH EXTRACTION      Social History   Socioeconomic  History  . Marital status: Married    Spouse name: Not on file  . Number of children: Not on file  . Years of education: Not on file  . Highest education level: Not on file  Occupational History  . Not on file  Tobacco Use  . Smoking status: Never Smoker  . Smokeless tobacco: Never Used  Vaping Use  . Vaping Use: Never used  Substance and Sexual Activity  . Alcohol use: No  . Drug use: No  . Sexual activity: Yes    Birth control/protection: Surgical  Other Topics Concern  . Not on file  Social History Narrative   ** Merged History Encounter **       Social Determinants of Health   Financial Resource Strain:   . Difficulty of Paying Living Expenses: Not on file  Food Insecurity: No Food Insecurity  . Worried About Charity fundraiser in the Last Year: Never true  . Ran Out of Food in the Last Year: Never true  Transportation Needs: No Transportation Needs  . Lack of Transportation (Medical): No  . Lack of Transportation (Non-Medical): No  Physical Activity:   . Days of Exercise per Week: Not on file  . Minutes of Exercise per Session: Not on file  Stress:   . Feeling of Stress : Not on file  Social Connections:   . Frequency of Communication with Friends and Family: Not on file  . Frequency of Social Gatherings with Friends and Family: Not on file  . Attends Religious Services: Not on file  . Active Member of Clubs or Organizations: Not on file  . Attends Archivist Meetings: Not on file  . Marital Status: Not on file     FAMILY HISTORY:  We obtained a detailed, 4-generation family history.  Significant diagnoses are listed below:  [name] has two sons and one daughter and several maternal and paternal half siblings, all of whom do not have a cancer history.  [name] mother passed away at age 72 and did not have a cancer history.  No maternal family history of cancer was reported.  [name] father passed away at age 51 and did not  have cancer.  No paternal family history of cancer was reported.   [name] is unaware of previous family history of genetic testing for hereditary cancer risks. Patient's maternal ancestors are of African American descent, and paternal ancestors are of African American descent. There is no reported Ashkenazi Jewish ancestry. There is no known consanguinity.  GENETIC COUNSELING ASSESSMENT: [name] is a 44 y.o. female with a personal history of breast which is somewhat suggestive of a hereditary cancer syndrome and predisposition to cancer given her age of diagnosis. We, therefore, discussed and recommended the following at today's visit.   DISCUSSION: We discussed that 5 - 10% of cancer is hereditary, with most cases of hereditary breast cancer associated with mutations in BRCA1/2.  There are other genes that can be associated with hereditary breast cancer syndromes.  The type of cancer risk and level of risk are gene-specific.  We discussed that testing is beneficial for  several reasons including knowing how to follow individuals after completing their treatment and understanding if other family members could be at risk for cancer and allowing them to undergo genetic testing.   We reviewed the characteristics, features and inheritance patterns of hereditary cancer syndromes. We also discussed genetic testing, including the appropriate family members to test, the process of testing, insurance coverage and turn-around-time for results. We discussed the implications of a negative, positive, carrier and/or variant of uncertain significant result. We recommended [name] pursue genetic testing for a panel that includes genes associated with breast cancer.   [name]  was offered a common hereditary cancer panel (48 genes) and an expanded pan-cancer panel (85 genes). [name] was informed of the benefits and limitations of each panel, including that expanded pan-cancer panels  contain several preliminary evidence genes that do not have clear management guidelines at this point in time.  We also discussed that as the number of genes included on a panel increases, the chances of variants of uncertain significance increases.  After considering the benefits and limitations of each gene panel, [name]  elected to have a common hereditary cancer panel through [facility].  The Common Hereditary Cancers Panel offered by [facility] includes sequencing and/or deletion duplication testing of the following 48 genes: APC, ATM, AXIN2, BARD1, BMPR1A, BRCA1, BRCA2, BRIP1, CDH1, CDK4, CDKN2A (p14ARF), CDKN2A (p16INK4a), CHEK2, CTNNA1, DICER1, EPCAM (Deletion/duplication testing only), GREM1 (promoter region deletion/duplication testing only), KIT, MEN1, MLH1, MSH2, MSH3, MSH6, MUTYH, NBN, NF1, NHTL1, PALB2, PDGFRA, PMS2, POLD1, POLE, PTEN, RAD50, RAD51C, RAD51D, RNF43, SDHB, SDHC, SDHD, SMAD4, SMARCA4. STK11, TP53, TSC1, TSC2, and VHL.  The following genes were evaluated for sequence changes only: SDHA and HOXB13 c.251G>A variant only.  Based on [name]'s personal history of breast cancer before the age of 5, she meets medical criteria for genetic testing. Despite that she meets criteria, she may still have an out of pocket cost. We discussed that if her out of pocket cost for testing is over $100, the laboratory will call and confirm whether she wants to proceed with testing.  If the out of pocket cost of testing is less than $100 she will be billed by the genetic testing laboratory.   PLAN: After considering the risks, benefits, and limitations, [name] provided informed consent to pursue genetic testing and the blood sample was sent to [facility] for analysis of the Common Hereditary Cancers Panel. Results should be available within approximately 3 weeks' time, at which point they will be disclosed by telephone to [name], as will any additional  recommendations warranted by these results. [name] will receive a summary of her genetic counseling visit and a copy of her results once available. This information will also be available in Epic.   Lastly, we encouraged [name] to remain in contact with cancer genetics annually so that we can continuously update the family history and inform her of any changes in cancer genetics and testing that may be of benefit for this family.   [name] questions were answered to her satisfaction today. Our contact information was provided should additional questions or concerns arise. Thank you for the referral and allowing [place] to share in the care of your patient.   Robin Kennedy, [place], [facility] Certified Film/video [email or web address] (P) [phone]  The patient was seen for a total of 15 minutes in face-to-face genetic counseling.  This patient was discussed with Drs. Magrinat, Lindi Adie and/or Burr Medico who agrees with the above.  _______________________________________________________________________ For Office Staff:  Number of people involved in session: 1 Was an Intern/ student involved with case: no

## 2020-06-10 NOTE — Progress Notes (Signed)
START ON PATHWAY REGIMEN - Breast     A cycle is every 21 days:     Pertuzumab      Pertuzumab      Trastuzumab-xxxx      Trastuzumab-xxxx      Carboplatin      Docetaxel   **Always confirm dose/schedule in your pharmacy ordering system**  Patient Characteristics: Preoperative or Nonsurgical Candidate (Clinical Staging), Neoadjuvant Therapy followed by Surgery, Invasive Disease, Chemotherapy, HER2 Positive, ER Positive Therapeutic Status: Preoperative or Nonsurgical Candidate (Clinical Staging) AJCC M Category: cM0 AJCC Grade: G3 Breast Surgical Plan: Neoadjuvant Therapy followed by Surgery ER Status: Positive (+) AJCC 8 Stage Grouping: IIIB HER2 Status: Positive (+) AJCC T Category: cT4b AJCC N Category: cN1 PR Status: Negative (-) Intent of Therapy: Curative Intent, Discussed with Patient

## 2020-06-10 NOTE — Progress Notes (Signed)
[place] Work  Initial Assessment   Robin Kennedy is a 44 y.o. year old female accompanied by patient and husband, Robin Kennedy. Clinical Social Work was referred by [facility] for assessment of psychosocial needs.   SDOH (Social Determinants of Health) assessments performed: Yes SDOH Interventions     Most Recent Value  SDOH Interventions  Food Insecurity Interventions Intervention Not Indicated  Housing Interventions Intervention Not Indicated  Transportation Interventions Intervention Not Indicated      Distress Screen completed: Yes ONCBCN DISTRESS SCREENING 06/10/2020  Screening Type Initial Screening  Distress experienced in past week (1-10) 3  Emotional problem type Nervousness/Anxiety  Information Concerns Type Lack of info about diagnosis;Lack of info about treatment    Family/Social Information:  . Housing Arrangement: patient lives with husband, youngest son (53yo). Older son (31yo) and daughter (37 yo) live outside the home . Family members/support persons in your life? Family and Friends- has group of 11 people who have formed a "Kindness group" to help during her treatment . Transportation concerns: no  . 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 Husband also works. Income source: Employment . Financial concerns: No o Type of concern: None . Food access concerns: no . Religious or spiritual practice: yes . Services Currently in place:  Working on bringing in short-term disability and FMLA paperwork to be completed  Coping/ Adjustment to diagnosis: . Patient understands treatment plan and what happens next? yes, exact plan still pending more scans, but understands general plan . Concerns about diagnosis and/or treatment: Overwhelmed by information . Patient reported stressors: Anxiety and Adjusting to my illness . Hopes and priorities: hopes to be cured . Current coping skills/ strengths: Average or above  average intelligence, Capable of independent living, Communication skills, Financial means, Motivation for treatment/growth and Supportive family/friends    SUMMARY: Current SDOH Barriers:  . none noted today  Clinical Social Work Clinical Goal(s):  Marland Kitchen Patient will continue to attend medical appts  Interventions: . Discussed common feeling and emotions when being diagnosed with cancer, and the importance of support during treatment . Informed patient of the support team roles and support services at [facility] . Provided CSW contact information and encouraged patient to call with any questions or concerns   Follow Up Plan: Patient will contact CSW with any questions or future needs. Her children are welcome to speak with this CSW to help with their own coping if needed Patient verbalizes understanding of plan: Yes    Robin Kennedy Robin Googe LCSW

## 2020-06-10 NOTE — Therapy (Signed)
[place], [place], [address] Phone: [phone]   Fax:  [phone]  Physical Therapy Evaluation  Patient Details  Name: Robin Kennedy MRN: [id number] Date of Birth: 1976/01/11 Referring Provider (PT): Dr. [name]   Encounter Date: 06/10/2020   PT End of Session - 06/10/20 0929    Visit Number 1    Number of Visits 2    Date for PT Re-Evaluation 12/09/20    PT Start Time 1008    PT Stop Time 1030   Also saw pt from [phone] for a total of 37 minutes   PT Time Calculation (min) 22 min    Activity Tolerance Patient tolerated treatment well    Behavior During Therapy [place] for tasks assessed/performed           Past Medical History:  Diagnosis Date  . Diabetes mellitus without complication ([place])   . SVD (spontaneous vaginal delivery)    X 3    Past Surgical History:  Procedure Laterality Date  . CYSTOSCOPY N/A 12/20/2016   Procedure: CYSTOSCOPY;  Surgeon: Lavonia Drafts, MD;  Location: [facility];  Service: Gynecology;  Laterality: N/A;  . ROBOTIC ASSISTED TOTAL HYSTERECTOMY WITH BILATERAL SALPINGO OOPHERECTOMY Bilateral 12/20/2016   Procedure: ROBOTIC ASSISTED TOTAL HYSTERECTOMY WITH BILATERAL SALPINGO OOPHORECTOMY;  Surgeon: Lavonia Drafts, MD;  Location: [facility];  Service: Gynecology;  Laterality: Bilateral;  . TUBAL LIGATION    . WISDOM TOOTH EXTRACTION      There were no vitals filed for this visit.    Subjective Assessment - 06/10/20 5701    Subjective Patient reports she is here today to be seen by her medical team for her newly diagnosed left breast cancer.    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 It measures 3.5 cm and is located in the upper inner quadrant. It is ER positive, PR negative, and HER2 positive with a Ki67 of 60%. She has 3 abnormal appearing lymph nodes and 1 was biopsied  and found to be positive. She has type II diabetes.    Patient Stated Goals Reduce lymphedema risk and learn post op shoulder ROM HEP    Currently in Pain? No/denies              [facility] PT Assessment - 06/10/20 0001      Assessment   Medical Diagnosis Left breast cancer    Referring Provider (PT) Dr. [name]    Onset Date/Surgical Date 06/02/20    Hand Dominance Right    Prior Therapy none      Precautions   Precautions Other (comment)    Precaution Comments active cancer      Restrictions   Weight Bearing Restrictions No      Balance Screen   Has the patient fallen in the past 6 months No    Has the patient had a decrease in activity level because of a fear of falling?  No    Is the patient reluctant to leave their home because of a fear of falling?  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; [employer]    Leisure She does not exercise  Cognition   Overall Cognitive Status Within Functional Limits for tasks assessed      Posture/Postural Control   Posture/Postural Control Postural limitations    Postural Limitations Rounded Shoulders;Forward head      ROM / Strength   AROM / PROM / Strength AROM;Strength      AROM   AROM Assessment Site Shoulder    Right/Left Shoulder Right;Left    Right Shoulder Extension 70 Degrees    Right Shoulder Flexion 167 Degrees    Right Shoulder ABduction 180 Degrees    Right Shoulder Internal Rotation 74 Degrees    Right Shoulder External Rotation 78 Degrees    Left Shoulder Extension 67 Degrees    Left Shoulder Flexion 151 Degrees    Left Shoulder ABduction 167 Degrees    Left Shoulder Internal Rotation 58 Degrees    Left Shoulder External Rotation 80 Degrees      Strength    Overall Strength Within functional limits for tasks performed             LYMPHEDEMA/ONCOLOGY QUESTIONNAIRE - 06/10/20 0001      Type   Cancer Type Left breast cancer      Lymphedema Assessments   Lymphedema Assessments Upper extremities      Right Upper Extremity Lymphedema   10 cm Proximal to Olecranon Process 29.7 cm    Olecranon Process 25.3 cm    10 cm Proximal to Ulnar Styloid Process 23.5 cm    Just Proximal to Ulnar Styloid Process 16.7 cm    Across Hand at [employer] 20 cm    At [place] of 2nd Digit 6.9 cm      Left Upper Extremity Lymphedema   10 cm Proximal to Olecranon Process 31 cm    Olecranon Process 25 cm    10 cm Proximal to Ulnar Styloid Process 22.9 cm    Just Proximal to Ulnar Styloid Process 15.9 cm    Across Hand at [employer] 19.6 cm    At [place] of 2nd Digit 6.8 cm           L-DEX FLOWSHEETS - 06/10/20 0900      L-DEX LYMPHEDEMA SCREENING   Measurement Type Unilateral    L-DEX MEASUREMENT EXTREMITY Upper Extremity    POSITION  Standing    DOMINANT SIDE Right    At Risk Side Left    BASELINE SCORE (UNILATERAL) 4.7           The patient was assessed using the L-Dex machine today to produce a lymphedema index baseline score. The patient will be reassessed on a regular basis (typically every 3 months) to obtain new L-Dex scores. If the score is > 6.5 points away from his/her baseline score indicating onset of subclinical lymphedema, it will be recommended to wear a compression garment for 4 weeks, 12 hours per day and then be reassessed. If the score continues to be > 6.5 points from baseline at reassessment, we will initiate lymphedema treatment. Assessing in this manner has a 95% rate of preventing clinically significant lymphedema.      Katina Dung - 06/10/20 0001    Open a tight or new jar No difficulty    Do heavy household chores (wash walls, wash floors) No difficulty    Carry a shopping bag or briefcase No difficulty    Wash  your back No difficulty    Use a knife to cut food No difficulty    Recreational activities in which you take some  force or impact through your arm, shoulder, or hand (golf, hammering, tennis) No difficulty    During the past week, to what extent has your arm, shoulder or hand problem interfered with your normal social activities with family, friends, neighbors, or groups? Not at all    During the past week, to what extent has your arm, shoulder or hand problem limited your work or other regular daily activities Not at all    Arm, shoulder, or hand pain. Mild    Tingling (pins and needles) in your arm, shoulder, or hand Mild    Difficulty Sleeping No difficulty    DASH Score 4.55 %            Objective measurements completed on examination: See above findings.        Patient was instructed today in a home exercise program today for post op shoulder range of motion. These included active assist shoulder flexion in sitting, scapular retraction, wall walking with shoulder abduction, and hands behind head external rotation.  She was encouraged to do these twice a day, holding 3 seconds and repeating 5 times when permitted by her physician.           PT Education - 06/10/20 0926    Education Details Lymphedema risk reduction and post op shoulder ROM HEP    Person(s) Educated Patient    Methods Explanation;Demonstration;Handout    Comprehension Returned demonstration;Verbalized understanding               PT Long Term Goals - 06/10/20 0937      PT LONG TERM GOAL #1   Title Patient will demonstrate she has regained full shoulder ROM and function post op compared to baseline assessments.    Time 6    Period Months    Status New    Target Date 12/09/20           Breast Clinic Goals - 06/10/20 1497      Patient will be able to verbalize understanding of pertinent lymphedema risk reduction practices relevant to her diagnosis specifically related to skin care.   Time 1     Period Days    Status Achieved      Patient will be able to return demonstrate and/or verbalize understanding of the post-op home exercise program related to regaining shoulder range of motion.   Time 1    Period Days    Status Achieved      Patient will be able to verbalize understanding of the importance of attending the postoperative After Breast Cancer Class for further lymphedema risk reduction education and therapeutic exercise.   Time 1    Period Days    Status Achieved                 Plan - 06/10/20 0930    Clinical Impression Statement Patient was diagnosed on 06/02/2020 with left grade III invasive ductal carcinoma breast cancer. It measures 3.5 cm and is located in the upper inner quadrant. It is ER positive, PR negative, and HER2 positive with a Ki67 of 60%. She has 3 abnormal appearing lymph nodes and 1 was biopsied and found to be positive. She has type II diabetes. Her multidisciplinary medical team met prior to her assessments to determine a recommended treatment plan. She is planning to have neoadjuvant chemotherapy followed by a left lumpectomy or mastectomy with a targeted axillary lymph node dissection, radiation, and anti-estrogen therapy. She will benefit from a post op PT reassessment to determine  needs and from L-Dex screens every 3 months for 2 years to detect subclinical lymphedema.    Stability/Clinical Decision Making Stable/Uncomplicated    Clinical Decision Making Low    Rehab Potential Excellent    PT Frequency --   Eval and 1 f/u visit   PT Treatment/Interventions ADLs/Self Care Home Management;Therapeutic exercise;Patient/family education    PT Next Visit Plan Will reassess 3-4 weeks post op to determine needs    PT Home Exercise Plan Post op shoulder ROM HEP    Consulted and Agree with Plan of Care Patient           Patient will benefit from skilled therapeutic intervention in order to improve the following deficits and impairments:  Postural  dysfunction, Decreased knowledge of precautions, Impaired UE functional use, Pain, Decreased range of motion  Visit Diagnosis: Malignant neoplasm of upper-inner quadrant of left breast in female, estrogen receptor positive ([place]) - Plan: PT plan of care cert/re-cert  Abnormal posture - Plan: PT plan of care cert/re-cert   Patient will follow up at outpatient cancer rehab 3-4 weeks following surgery.  If the patient requires physical therapy at that time, a specific plan will be dictated and sent to the referring physician for approval. The patient was educated today on appropriate basic range of motion exercises to begin post operatively and the importance of attending the After Breast Cancer class following surgery.  Patient was educated today on lymphedema risk reduction practices as it pertains to recommendations that will benefit the patient immediately following surgery.  She verbalized good understanding.     Problem List Patient Active Problem List   Diagnosis Date Noted  . Malignant neoplasm of upper-inner quadrant of left breast in female, estrogen receptor positive ([place]) 06/05/2020  . Post-operative state 12/20/2016  . Fibroids 12/06/2016  . Pelvic pain in female 12/06/2016  . Diabetes mellitus ([place]) 06/19/2012   Annia Friendly, PT 06/10/20 1:24 PM  [place], [place], [address] Phone: [phone]   Fax:  [phone]  Name: Robin Kennedy MRN: [id number] Date of Birth: 1976-04-11

## 2020-06-12 ENCOUNTER — Encounter: Payer: Self-pay | Admitting: Radiation Oncology

## 2020-06-15 ENCOUNTER — Other Ambulatory Visit: Payer: Self-pay | Admitting: Hematology and Oncology

## 2020-06-15 ENCOUNTER — Ambulatory Visit
Admission: RE | Admit: 2020-06-15 | Discharge: 2020-06-15 | Disposition: A | Discharge status: Home / Self Care | Payer: 59 | Source: Ambulatory Visit | Attending: Hematology and Oncology | Admitting: Hematology and Oncology

## 2020-06-15 ENCOUNTER — Ambulatory Visit (HOSPITAL_COMMUNITY)
Admission: RE | Admit: 2020-06-15 | Discharge: 2020-06-15 | Disposition: A | Discharge status: Home / Self Care | Payer: 59 | Source: Ambulatory Visit | Attending: Hematology and Oncology | Admitting: Hematology and Oncology

## 2020-06-15 ENCOUNTER — Other Ambulatory Visit: Payer: Self-pay

## 2020-06-15 DIAGNOSIS — Z170 Estrogen receptor positive status [ER+]: Secondary | ICD-10-CM | POA: Diagnosis not present

## 2020-06-15 DIAGNOSIS — C50212 Malignant neoplasm of upper-inner quadrant of left female breast: Secondary | ICD-10-CM | POA: Diagnosis not present

## 2020-06-15 DIAGNOSIS — C779 Secondary and unspecified malignant neoplasm of lymph node, unspecified: Secondary | ICD-10-CM | POA: Insufficient documentation

## 2020-06-15 DIAGNOSIS — Z0189 Encounter for other specified special examinations: Secondary | ICD-10-CM

## 2020-06-15 DIAGNOSIS — Z01818 Encounter for other preprocedural examination: Secondary | ICD-10-CM | POA: Diagnosis not present

## 2020-06-15 LAB — ECHOCARDIOGRAM COMPLETE
Area-P 1/2: 3.53 cm2
Calc EF: 59.9 %
S' Lateral: 2.8 cm
Single Plane A2C EF: 67.3 %
Single Plane A4C EF: 54.7 %

## 2020-06-15 MED ORDER — GADOBUTROL 1 MMOL/ML IV SOLN
9.0000 mL | Freq: Once | INTRAVENOUS | Status: AC | PRN
  Administered 2020-06-15: 9 mL via INTRAVENOUS

## 2020-06-15 NOTE — Progress Notes (Signed)
  Echocardiogram 2D Echocardiogram has been performed.  Robin Kennedy 06/15/2020, 8:49 AM

## 2020-06-16 ENCOUNTER — Other Ambulatory Visit: Payer: Self-pay | Admitting: Hematology and Oncology

## 2020-06-16 ENCOUNTER — Ambulatory Visit (HOSPITAL_COMMUNITY)
Admission: RE | Admit: 2020-06-16 | Discharge: 2020-06-16 | Disposition: A | Discharge status: Home / Self Care | Payer: 59 | Source: Ambulatory Visit | Attending: Hematology and Oncology | Admitting: Hematology and Oncology

## 2020-06-16 ENCOUNTER — Other Ambulatory Visit: Payer: Self-pay | Admitting: *Deleted

## 2020-06-16 ENCOUNTER — Encounter (HOSPITAL_COMMUNITY)
Admission: RE | Admit: 2020-06-16 | Discharge: 2020-06-16 | Disposition: A | Discharge status: Home / Self Care | Payer: 59 | Source: Ambulatory Visit | Attending: Hematology and Oncology | Admitting: Hematology and Oncology

## 2020-06-16 ENCOUNTER — Inpatient Hospital Stay (HOSPITAL_BASED_OUTPATIENT_CLINIC_OR_DEPARTMENT_OTHER): Payer: 59 | Admitting: Hematology and Oncology

## 2020-06-16 ENCOUNTER — Other Ambulatory Visit: Payer: Self-pay

## 2020-06-16 ENCOUNTER — Inpatient Hospital Stay: Payer: 59 | Attending: Hematology and Oncology

## 2020-06-16 DIAGNOSIS — R928 Other abnormal and inconclusive findings on diagnostic imaging of breast: Secondary | ICD-10-CM

## 2020-06-16 DIAGNOSIS — Z170 Estrogen receptor positive status [ER+]: Secondary | ICD-10-CM

## 2020-06-16 DIAGNOSIS — Z5111 Encounter for antineoplastic chemotherapy: Secondary | ICD-10-CM | POA: Diagnosis not present

## 2020-06-16 DIAGNOSIS — C50212 Malignant neoplasm of upper-inner quadrant of left female breast: Secondary | ICD-10-CM

## 2020-06-16 DIAGNOSIS — Z79899 Other long term (current) drug therapy: Secondary | ICD-10-CM | POA: Insufficient documentation

## 2020-06-16 DIAGNOSIS — Z23 Encounter for immunization: Secondary | ICD-10-CM | POA: Diagnosis not present

## 2020-06-16 DIAGNOSIS — N6310 Unspecified lump in the right breast, unspecified quadrant: Secondary | ICD-10-CM

## 2020-06-16 DIAGNOSIS — Z01818 Encounter for other preprocedural examination: Secondary | ICD-10-CM | POA: Diagnosis not present

## 2020-06-16 MED ORDER — TECHNETIUM TC 99M MEDRONATE IV KIT
19.6000 | PACK | Freq: Once | INTRAVENOUS | Status: AC
  Administered 2020-06-16: 19.6 via INTRAVENOUS

## 2020-06-16 MED ORDER — IOHEXOL 300 MG/ML  SOLN
100.0000 mL | Freq: Once | INTRAMUSCULAR | Status: AC | PRN
  Administered 2020-06-16: 100 mL via INTRAVENOUS

## 2020-06-16 NOTE — Progress Notes (Signed)
I was informed that the orders for CT chest abdomen pelvis were not available.  Therefore I entered new orders today.

## 2020-06-16 NOTE — Progress Notes (Signed)
Patient Care Team: Lavonia Drafts, MD as PCP - General (Obstetrics and Gynecology) Shawnee Knapp, MD (Family Medicine) Mauro Kaufmann, RN as Oncology Nurse Navigator Rockwell Germany, RN as Oncology Nurse Navigator [name], MD as Consulting Physician (General Surgery) Nicholas Lose, MD as Consulting Physician (Hematology and Oncology) Eppie Gibson, MD as Attending Physician (Radiation Oncology)  DIAGNOSIS:  Encounter Diagnosis  Name Primary?  . Malignant neoplasm of upper-inner quadrant of left breast in female, estrogen receptor positive ([place])     SUMMARY OF ONCOLOGIC HISTORY: Oncology History  Malignant neoplasm of upper-inner quadrant of left breast in female, estrogen receptor positive ([place])  06/05/2020 Initial Diagnosis   06/05/2020: Enlarging left breast with pain and diffuse skin thickening: Mammogram revealed 3.5 cm mass 11 o'clock position with 3 abnormal lymph nodes: 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 Right breast benign-appearing calcifications previous stereotactic biopsy was attempted but it was too superficial and felt to be benign.   06/23/2020 -  Chemotherapy   The patient had palonosetron (ALOXI) injection 0.25 mg, 0.25 mg, Intravenous,  Once, 0 of 6 cycles pegfilgrastim-jmdb (FULPHILA) injection 6 mg, 6 mg, Subcutaneous,  Once, 0 of 6 cycles CARBOplatin (PARAPLATIN) 700 mg in sodium chloride 0.9 % 250 mL chemo infusion, 700 mg (100 % of original dose 700 mg), Intravenous,  Once, 0 of 6 cycles Dose modification: 700 mg (original dose 700 mg, Cycle 1) DOCEtaxel (TAXOTERE) 160 mg in sodium chloride 0.9 % 250 mL chemo infusion, 75 mg/m2 = 160 mg, Intravenous,  Once, 0 of 6 cycles fosaprepitant (EMEND) 150 mg in sodium chloride 0.9 % 145 mL IVPB, 150 mg, Intravenous,  Once, 0 of 6 cycles pertuzumab (PERJETA) 420 mg in sodium chloride 0.9 % 250 mL chemo infusion, 420 mg (100 % of original dose 420 mg),  Intravenous, Once, 0 of 6 cycles Dose modification: 420 mg (original dose 420 mg, Cycle 1, Reason: Provider Judgment) trastuzumab-dkst (OGIVRI) 735 mg in sodium chloride 0.9 % 250 mL chemo infusion, 8 mg/kg = 735 mg, Intravenous,  Once, 0 of 6 cycles  for chemotherapy treatment.      CHIEF COMPLIANT: Follow-up after recent scans  INTERVAL HISTORY: Robin Kennedy is a 44 year old above-mentioned history of inflammatory left breast cancer who underwent scans and is here today to discuss results.  Breast MRI showed inflammatory changes but also had multiple multicentric nodules and at least 9 lymph nodes.  There was a small lesion in the contralateral breast which will need to be biopsied.  The CT chest abdomen pelvis revealed inflammatory breast mass with enlarged axillary, pectoral and supraclavicular lymph nodes.   ALLERGIES:  has No Known Allergies.  MEDICATIONS:  Current Outpatient Medications  Medication Sig Dispense Refill  . Fe Fum-FePoly-FA-Vit C-Vit B3 (INTEGRA F) 125-1 MG CAPS Take 1 capsule by mouth daily. (Patient not taking: Reported on 05/27/2020) 30 capsule 6  . glimepiride (AMARYL) 4 MG tablet Take 4 mg by mouth 2 (two) times daily.  6  . glucose blood (ONE TOUCH ULTRA TEST) test strip Use to check cbgs qd 100 each 11  . ibuprofen (ADVIL,MOTRIN) 600 MG tablet Take 1 tablet (600 mg total) by mouth every 6 (six) hours as needed (mild pain). (Patient not taking: Reported on 05/27/2020) 30 tablet 0  . JANUMET XR 50-1000 MG TB24 Take 1 tablet by mouth daily. (Patient not taking: Reported on 05/27/2020)  12  . lidocaine-prilocaine (EMLA) cream Apply to affected  area once 30 g 3  . LORazepam (ATIVAN) 0.5 MG tablet Take 1 tablet (0.5 mg total) by mouth at bedtime as needed for sleep. 30 tablet 1  . metFORMIN (GLUCOPHAGE-XR) 500 MG 24 hr tablet Take 1,000 mg by mouth 2 (two) times daily.  3  . ondansetron (ZOFRAN) 8 MG tablet Take 1 tablet (8 mg total) by mouth 2 (two) times  daily as needed (Nausea or vomiting). Start on the third day after chemotherapy. 30 tablet 1  . prochlorperazine (COMPAZINE) 10 MG tablet Take 1 tablet (10 mg total) by mouth every 6 (six) hours as needed (Nausea or vomiting). 30 tablet 1   No current facility-administered medications for this visit.    PHYSICAL EXAMINATION: ECOG PERFORMANCE STATUS: 1 - Symptomatic but completely ambulatory  There were no vitals filed for this visit. There were no vitals filed for this visit.     LABORATORY DATA:  I have reviewed the data as listed CMP Latest Ref Rng & Units 06/10/2020 07/18/2018 12/21/2016  Glucose 70 - 99 mg/dL 161(H) 146(H) 148(H)  BUN 6 - 20 mg/dL '9 9 10  ' Creatinine 0.44 - 1.00 mg/dL 0.78 0.61 0.72  Sodium 135 - 145 mmol/L 139 138 137  Potassium 3.5 - 5.1 mmol/L 4.0 4.1 3.5  Chloride 98 - 111 mmol/L 105 103 107  CO2 22 - 32 mmol/L 26 18(L) 24  Calcium 8.9 - 10.3 mg/dL 9.6 9.7 8.2(L)  Total Protein 6.5 - 8.1 g/dL 6.6 6.9 -  Total Bilirubin 0.3 - 1.2 mg/dL 0.6 0.3 -  Alkaline Phos 38 - 126 U/L 43 48 -  AST 15 - 41 U/L 8(L) 9 -  ALT 0 - 44 U/L 7 9 -    Lab Results  Component Value Date   WBC 7.8 06/10/2020   HGB 12.1 06/10/2020   HCT 38.2 06/10/2020   MCV 84.9 06/10/2020   PLT 253 06/10/2020   NEUTROABS 4.4 06/10/2020    ASSESSMENT & PLAN:  Malignant neoplasm of upper-inner quadrant of left breast in female, estrogen receptor positive ([place]) 06/05/2020: Enlarging left breast with pain and diffuse skin thickening: Mammogram revealed 3.5 cm mass 11 o'clock position with 3 abnormal lymph nodes: 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 Right breast benign-appearing calcifications previous stereotactic biopsy was attempted but it was too superficial and felt to be benign.  Recommendation based on multidisciplinary tumor board: 1. Neoadjuvant chemotherapy with TCH Perjeta 6 cycles followed by Herceptin Perjeta versus Kadcyla  maintenance for 1 year 2. Followed by breast conserving surgery if possible with sentinel lymph node study 3. Followed by adjuvant radiation therapy if patient had lumpectomy ------------------------------------------------------------------------------------------------------------------------------------------ Breast MRI 06/15/2020: Inflammatory left breast cancer changes, multicentric contiguous masses 10 cm max, 9 satellite nodules, 9 axillary lymph nodes, enlarged anterior mediastinal lymph node, indeterminate 1.1 cm mass right breast posterior depth  CT CAP 06/16/2020: Extensive cutaneous thickening left breast, metastatic adenopathy left axilla, left pectoralis, left supraclavicular.  No other distant metastatic disease.  Bone scan performed today results are pending. I discussed the results with the patient and determined that the treatment plan will remain the same. The mediastinal mass detected on the breast MRI was not visible on the CT scan.  Return to clinic next week to start chemotherapy.    No orders of the defined types were placed in this encounter.  The patient has a good understanding of the overall plan. she agrees with it. she will  call with any problems that may develop before the next visit here. Total time spent: 30 mins including face to face time and time spent for planning, charting and co-ordination of care   Harriette Ohara, MD 06/16/20

## 2020-06-16 NOTE — Assessment & Plan Note (Signed)
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 Right breast benign-appearing calcifications previous stereotactic biopsy was attempted but it was too superficial and felt to be benign.  Recommendation based on multidisciplinary tumor board: 1. Neoadjuvant chemotherapy with TCH Perjeta 6 cycles followed by Herceptin Perjeta versus Kadcyla maintenance for 1 year 2. Followed by breast conserving surgery if possible with sentinel lymph node study 3. Followed by adjuvant radiation therapy if patient had lumpectomy ------------------------------------------------------------------------------------------------------------------------------------------ Breast MRI 06/15/2020: Inflammatory left breast cancer changes, multicentric contiguous masses 10 cm max, 9 satellite nodules, 9 axillary lymph nodes, enlarged anterior mediastinal lymph node, indeterminate 1.1 cm mass right breast posterior depth  CT CAP 06/16/2020: Extensive cutaneous thickening left breast, metastatic adenopathy left axilla, left pectoralis, left supraclavicular.  No other distant metastatic disease.  Bone scan performed today results are pending. I discussed the results with the patient and determined that the treatment plan will remain the same. The mediastinal mass detected on the breast MRI was not visible on the CT scan.  Return to clinic next week to start chemotherapy.

## 2020-06-17 ENCOUNTER — Other Ambulatory Visit: Payer: Self-pay | Admitting: Hematology and Oncology

## 2020-06-17 ENCOUNTER — Encounter (HOSPITAL_BASED_OUTPATIENT_CLINIC_OR_DEPARTMENT_OTHER): Payer: Self-pay | Admitting: General Surgery

## 2020-06-17 ENCOUNTER — Other Ambulatory Visit: Payer: Self-pay

## 2020-06-17 DIAGNOSIS — R928 Other abnormal and inconclusive findings on diagnostic imaging of breast: Secondary | ICD-10-CM

## 2020-06-17 DIAGNOSIS — C50212 Malignant neoplasm of upper-inner quadrant of left female breast: Secondary | ICD-10-CM

## 2020-06-17 DIAGNOSIS — N6310 Unspecified lump in the right breast, unspecified quadrant: Secondary | ICD-10-CM

## 2020-06-17 DIAGNOSIS — Z170 Estrogen receptor positive status [ER+]: Secondary | ICD-10-CM

## 2020-06-18 ENCOUNTER — Encounter: Payer: Self-pay | Admitting: *Deleted

## 2020-06-18 ENCOUNTER — Telehealth: Payer: Self-pay | Admitting: *Deleted

## 2020-06-18 NOTE — Telephone Encounter (Signed)
Spoke with patient to follow up from [facility] last week and assess navigation needs. Patient states she is doing ok and seems to have a very positive outlook and ready to move forward with her treatment.  Encouraged her to call should anything arise.

## 2020-06-19 ENCOUNTER — Telehealth: Payer: Self-pay | Admitting: Hematology and Oncology

## 2020-06-19 NOTE — Telephone Encounter (Signed)
No 11/2 los, no changes made to pt schedule

## 2020-06-20 ENCOUNTER — Other Ambulatory Visit (HOSPITAL_COMMUNITY): Payer: 59

## 2020-06-22 ENCOUNTER — Other Ambulatory Visit (HOSPITAL_COMMUNITY)
Admission: RE | Admit: 2020-06-22 | Discharge: 2020-06-22 | Disposition: A | Discharge status: Home / Self Care | Payer: 59 | Source: Ambulatory Visit | Attending: General Surgery | Admitting: General Surgery

## 2020-06-22 ENCOUNTER — Ambulatory Visit
Admission: RE | Admit: 2020-06-22 | Discharge: 2020-06-22 | Disposition: A | Discharge status: Home / Self Care | Payer: 59 | Source: Ambulatory Visit | Attending: Hematology and Oncology | Admitting: Hematology and Oncology

## 2020-06-22 ENCOUNTER — Encounter (HOSPITAL_BASED_OUTPATIENT_CLINIC_OR_DEPARTMENT_OTHER)
Admission: RE | Admit: 2020-06-22 | Discharge: 2020-06-22 | Disposition: A | Discharge status: Home / Self Care | Payer: 59 | Source: Ambulatory Visit | Attending: General Surgery | Admitting: General Surgery

## 2020-06-22 ENCOUNTER — Other Ambulatory Visit (HOSPITAL_COMMUNITY): Payer: Self-pay | Admitting: Diagnostic Radiology

## 2020-06-22 ENCOUNTER — Other Ambulatory Visit: Payer: Self-pay

## 2020-06-22 ENCOUNTER — Other Ambulatory Visit (HOSPITAL_COMMUNITY): Payer: 59

## 2020-06-22 DIAGNOSIS — Z01812 Encounter for preprocedural laboratory examination: Secondary | ICD-10-CM | POA: Diagnosis not present

## 2020-06-22 DIAGNOSIS — C50212 Malignant neoplasm of upper-inner quadrant of left female breast: Secondary | ICD-10-CM

## 2020-06-22 DIAGNOSIS — Z170 Estrogen receptor positive status [ER+]: Secondary | ICD-10-CM

## 2020-06-22 DIAGNOSIS — N6310 Unspecified lump in the right breast, unspecified quadrant: Secondary | ICD-10-CM

## 2020-06-22 DIAGNOSIS — R928 Other abnormal and inconclusive findings on diagnostic imaging of breast: Secondary | ICD-10-CM

## 2020-06-22 DIAGNOSIS — Z20822 Contact with and (suspected) exposure to covid-19: Secondary | ICD-10-CM | POA: Diagnosis not present

## 2020-06-22 HISTORY — PX: BREAST BIOPSY: SHX20

## 2020-06-22 LAB — BASIC METABOLIC PANEL
Anion gap: 10 (ref 5–15)
BUN: 5 mg/dL — ABNORMAL LOW (ref 6–20)
CO2: 24 mmol/L (ref 22–32)
Calcium: 9 mg/dL (ref 8.9–10.3)
Chloride: 105 mmol/L (ref 98–111)
Creatinine, Ser: 0.67 mg/dL (ref 0.44–1.00)
GFR, Estimated: >60 mL/min (ref >60)
Glucose, Bld: 199 mg/dL — ABNORMAL HIGH (ref 70–99)
Potassium: 4 mmol/L (ref 3.5–5.1)
Sodium: 139 mmol/L (ref 135–145)

## 2020-06-22 LAB — SARS CORONAVIRUS 2 (TAT 6-24 HRS): SARS Coronavirus 2: NEGATIVE

## 2020-06-22 MED ORDER — GADOBUTROL 1 MMOL/ML IV SOLN
10.0000 mL | Freq: Once | INTRAVENOUS | Status: AC | PRN
  Administered 2020-06-22: 10 mL via INTRAVENOUS

## 2020-06-22 NOTE — H&P (Signed)
Robin Kennedy Appointment: 06/10/2020 9:00 AM Location: [facility] Patient #: [id number] DOB: 05-Jun-1976 Undefined / Language: Cleophus Molt / Race: Black or African American Female   History of Present Illness [name] MD; 06/12/2020 5:40 PM) The patient is a 44 year old female who presents with breast cancer. Pt is a 44 yo F referred by Dr. Autumn Patty for a new diagnosis of left breast cancer 05/2020. The patient presented with a palpable left breast pain and was found to have a palpable left breast mass. Diagnostic imaging was performed and it showed a 3.5 cm mass in the upper inner quadrant with three enlarged lymph nodes. 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 Ki 67 was 60%. She had a normal mammogram 6 months ago on that side and had some right sided calcs that appeared stable for 6 months.   She has no known family history of cancer. She had menarche at age 1, menopause with hysterectomy. She is a G4P3 with first child age 5.    dx mammogram/us 06/02/2020 CLINICAL DATA: Patient presents with an inflamed, enlarged left breast, which began approximately 1 week ago, relatively abruptly, at which time there was significant associated pain. She was given Bactrim antibiotic therapy by her clinician, of which she has taken approximately 7 days worth, with improvement, particularly in pain. She still has breast swelling and heaviness.  EXAM: DIGITAL DIAGNOSTIC LEFT MAMMOGRAM WITH CAD AND TOMO  ULTRASOUND LEFT BREAST  COMPARISON: Previous exam(s).  ACR Breast Density Category c: The breast tissue is heterogeneously dense, which may obscure small masses.  FINDINGS: There is diffuse increased density in the breast, predominantly located in the retroareolar and upper outer quadrant, with associated trabecular thickening and significant anterior skin thickening. In the upper inner  quadrant there is a bilobed mass versus 2 directly contiguous masses, with irregular margins. There are no other defined masses. There is no architectural distortion and there are no suspicious calcifications.  Mammographic images were processed with CAD.  On physical exam, the breast is enlarged and edematous with the skin anteriorly being thickened. Breast is firm to palpation anteriorly. There is no defined mass.  Targeted ultrasound is performed, showing an irregular hypoechoic bilobed mass at 11 o'clock, 9 cm from the nipple, measuring 3.5 x 2.0 x 2.3 cm. Mass shows internal blood flow on color Doppler analysis. In the left axilla there are at least 3 abnormal lymph nodes, the largest with a cortical thickening 1.2 cm.  IMPRESSION: 1. 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 There is a bilobed, 3.5 cm hypoechoic irregular mass at 11 o'clock, which could be inflammatory or even a complex collection. Enlarged/thickened left axillary lymph nodes may all be reactive, but could be metastatic.  RECOMMENDATION: 1. Attempt at aspiration of bilobed mass versus complex collection. If this cannot be aspirated, then ultrasound-guided core needle biopsy will be performed along with ultrasound-guided core needle biopsy of 1 of the enlarged/abnormal left axillary lymph nodes. This will be performed today.  I have discussed the findings and recommendations with the patient. If applicable, a reminder letter will be sent to the patient regarding the next appointment.  BI-RADS CATEGORY 4: Suspicious.    pathology 06/02/2020 1. Breast, left, needle core biopsy, 11 o'clock, 9 cmfn - INVASIVE DUCTAL CARCINOMA - SEE COMMENT The tumor cells are POSITIVE for Her2 (3+). Estrogen Receptor: 10%,  POSITIVE, MODERATE STAINING INTENSITY Progesterone Receptor: 0%, NEGATIVE Proliferation Marker Ki67: 60%  2. Lymph node,  needle/core biopsy, axilla - INVASIVE DUCTAL CARCINOMA - SEE COMMENT The tumor cells are POSITIVE for Her2 (3+). Estrogen Receptor: 20%, POSITIVE, MODERATE STAINING INTENSITY Progesterone Receptor: 0%, NEGATIVE Proliferation Marker Ki67: 60%  Microscopic Comment 1. Based on the biopsy, the carcinoma appears Nottingham grade 3 of 3 and measures 1.4 cm in greatest linear extent.   CBC, CMET essentially normal except for glucose 161 06/10/2020    Past Surgical History Conni Slipper, RN; 06/10/2020 8:15 AM) Hysterectomy (due to cancer) - Partial   Diagnostic Studies History Conni Slipper, RN; 06/10/2020 8:15 AM) Colonoscopy  never Mammogram  within last year Pap Smear  1-5 years ago  Medication History Conni Slipper, RN; 06/10/2020 8:15 AM) Medications Reconciled  Social History Conni Slipper, RN; 06/10/2020 8:15 AM) Caffeine use  Carbonated beverages. No alcohol use  No drug use  Tobacco use  Never smoker.  Family History Conni Slipper, RN; 06/10/2020 8:15 AM) Alcohol Abuse  Father. Depression  Family Members In General, Father. Diabetes Mellitus  Brother. Respiratory Condition  Mother. Seizure disorder  Brother.  Pregnancy / Birth History Conni Slipper, RN; 06/10/2020 8:15 AM) Age at menarche  31 years. Contraceptive History  Depo-provera, Oral contraceptives. Gravida  4 Irregular periods  Maternal age  48-20 Para  3  Other Problems Conni Slipper, RN; 06/10/2020 8:15 AM) Diabetes Mellitus     Review of Systems Conni Slipper RN; 06/10/2020 8:15 AM) General Not Present- Appetite Loss, Chills, Fatigue, Fever, Night Sweats, Weight Gain and Weight Loss. Skin Present- Change in Wart/Mole. Not Present- Dryness, Hives, Jaundice, New Lesions, Non-Healing Wounds, Rash and Ulcer. HEENT Present- Wears glasses/contact lenses. Not Present- Earache, Hearing Loss, Hoarseness, Nose Bleed, Oral Ulcers, Ringing in the Ears, Seasonal Allergies, Sinus Pain, Sore Throat,  Visual Disturbances and Yellow Eyes. Respiratory Not Present- Bloody sputum, Chronic Cough, Difficulty Breathing, Snoring and Wheezing. Breast Not Present- Breast Mass, Breast Pain, Nipple Discharge and Skin Changes. Cardiovascular Present- Swelling of Extremities. Not Present- Chest Pain, Difficulty Breathing Lying Down, Leg Cramps, Palpitations, Rapid Heart Rate and Shortness of Breath. Gastrointestinal Present- Gets full quickly at meals. Not Present- Abdominal Pain, Bloating, Bloody Stool, Change in Bowel Habits, Chronic diarrhea, Constipation, Difficulty Swallowing, Excessive gas, Hemorrhoids, Indigestion, Nausea, Rectal Pain and Vomiting. Female Genitourinary Not Present- Frequency, Nocturia, Painful Urination, Pelvic Pain and Urgency. Musculoskeletal Present- Swelling of Extremities. Not Present- Back Pain, Joint Pain, Joint Stiffness, Muscle Pain and Muscle Weakness. Psychiatric Not Present- Anxiety, Bipolar, Change in Sleep Pattern, Depression, Fearful and Frequent crying. Endocrine Present- Cold Intolerance, Hot flashes and New Diabetes. Not Present- Excessive Hunger, Hair Changes and Heat Intolerance. Hematology Present- Easy Bruising and Excessive bleeding. Not Present- Blood Thinners, Gland problems, HIV and Persistent Infections.  Vitals [name] MD; 06/12/2020 5:33 PM) 06/12/2020 5:31 PM Weight: 204.9 lb Height: 68in Body Surface Area: 2.07 m Body Mass Index: 31.15 kg/m  Temp.: 98.15F  Pulse: 92 (Regular)  Resp.: 17 (Unlabored)  BP: 127/71(Sitting, Left Arm, Standard)       Physical Exam [name] MD; 06/12/2020 5:42 PM) General Mental Status-Alert. General Appearance-Consistent with stated age. Hydration-Well hydrated. Voice-Normal.  Head and Neck Head-normocephalic, atraumatic with no lesions or palpable masses. Trachea-midline. Thyroid Gland Characteristics - normal size and consistency.  Eye Eyeball - Bilateral-Extraocular  movements intact. Sclera/Conjunctiva - Bilateral-No scleral icterus.  Chest and Lung Exam Chest and lung exam reveals -quiet, even and easy respiratory effort with no use  of accessory muscles and on auscultation, normal breath sounds, no adventitious sounds and normal vocal resonance. Inspection Chest Wall - Normal. Back - normal.  Breast Note: left breast appears larger wtihout erythema, but with lymphedema medially. upper inner left breast contour abnormal. + lymph nodes palpable on the left. mass palpable around 4 cm upper inner left breast. no nipple retraction or skin dimpling. no LAD on right. right breast benign.   Cardiovascular Cardiovascular examination reveals -normal heart sounds, regular rate and rhythm with no murmurs and normal pedal pulses bilaterally.  Abdomen Inspection Inspection of the abdomen reveals - No Hernias. Palpation/Percussion Palpation and Percussion of the abdomen reveal - Soft, Non Tender, No Rebound tenderness, No Rigidity (guarding) and No hepatosplenomegaly. Auscultation Auscultation of the abdomen reveals - Bowel sounds normal.  Neurologic Neurologic evaluation reveals -alert and oriented x 3 with no impairment of recent or remote memory. Mental Status-Normal.  Musculoskeletal Global Assessment -Note: no gross deformities.  Normal Exam - Left-Upper Extremity Strength Normal and Lower Extremity Strength Normal. Normal Exam - Right-Upper Extremity Strength Normal and Lower Extremity Strength Normal.  Lymphatic Head & Neck  General Head & Neck Lymphatics: Bilateral - Description - Normal. Axillary  General Axillary Region: Bilateral - Description - Normal. Tenderness - Non Tender. Femoral & Inguinal  Generalized Femoral & Inguinal Lymphatics: Bilateral - Description - No Generalized lymphadenopathy.    Assessment & Plan [name] MD; 06/12/2020 5:46 PM) MALIGNANT NEOPLASM OF UPPER-INNER QUADRANT OF LEFT BREAST IN  FEMALE, ESTROGEN RECEPTOR POSITIVE (C50.212) Impression: Pt with newly diagnosed left breast cancer, cT2N1. Will get MR to better define given right sided prior findings as well as young age and her 2 positive tumor.  Will plan genetics referral given young age and neoajduvant chemotherapy. She is larger breasted and does not appear to have inflammatory cancer. If her genetic testing is negative, she may desire breast conservation if she has a good response to chemotherapy. This would also depend on MR findings.  no matter the type of breast surgery, she is recommended to receive adjuvant XRT and antihormonal therapy.  Staging scans will also be ordered.  I reviewed port placement and risks. CARCINOMA OF LEFT BREAST METASTATIC TO AXILLARY LYMPH NODE (C50.912) Impression: If MR shows more than 3 nodes, she will be recommended to get ALND. otherwise, would plan seed targeted node with SLN bx.

## 2020-06-22 NOTE — Progress Notes (Signed)

## 2020-06-23 ENCOUNTER — Ambulatory Visit (HOSPITAL_COMMUNITY): Payer: 59

## 2020-06-23 ENCOUNTER — Encounter (HOSPITAL_BASED_OUTPATIENT_CLINIC_OR_DEPARTMENT_OTHER): Payer: Self-pay | Admitting: General Surgery

## 2020-06-23 ENCOUNTER — Encounter: Payer: Self-pay | Admitting: *Deleted

## 2020-06-23 ENCOUNTER — Other Ambulatory Visit: Payer: Self-pay

## 2020-06-23 ENCOUNTER — Ambulatory Visit (HOSPITAL_BASED_OUTPATIENT_CLINIC_OR_DEPARTMENT_OTHER): Payer: 59 | Admitting: Anesthesiology

## 2020-06-23 ENCOUNTER — Encounter (HOSPITAL_BASED_OUTPATIENT_CLINIC_OR_DEPARTMENT_OTHER)
Admission: RE | Disposition: A | Discharge status: Home / Self Care | Payer: Self-pay | Source: Home / Self Care | Attending: General Surgery

## 2020-06-23 ENCOUNTER — Ambulatory Visit (HOSPITAL_BASED_OUTPATIENT_CLINIC_OR_DEPARTMENT_OTHER)
Admission: RE | Admit: 2020-06-23 | Discharge: 2020-06-23 | Disposition: A | Discharge status: Home / Self Care | Payer: 59 | Attending: General Surgery | Admitting: General Surgery

## 2020-06-23 DIAGNOSIS — Z170 Estrogen receptor positive status [ER+]: Secondary | ICD-10-CM | POA: Insufficient documentation

## 2020-06-23 DIAGNOSIS — C50212 Malignant neoplasm of upper-inner quadrant of left female breast: Secondary | ICD-10-CM | POA: Diagnosis not present

## 2020-06-23 DIAGNOSIS — Z419 Encounter for procedure for purposes other than remedying health state, unspecified: Secondary | ICD-10-CM

## 2020-06-23 DIAGNOSIS — Z95828 Presence of other vascular implants and grafts: Secondary | ICD-10-CM

## 2020-06-23 HISTORY — PX: PORTACATH PLACEMENT: SHX2246

## 2020-06-23 LAB — GLUCOSE, CAPILLARY
Glucose-Capillary: 65 mg/dL — ABNORMAL LOW (ref 70–99)
Glucose-Capillary: 71 mg/dL (ref 70–99)

## 2020-06-23 SURGERY — INSERTION, TUNNELED CENTRAL VENOUS DEVICE, WITH PORT
Anesthesia: General | Site: Chest | Laterality: Left

## 2020-06-23 MED ORDER — OXYCODONE HCL 5 MG PO TABS: 5.0000 mg | ORAL_TABLET | Freq: Once | ORAL | Status: DC | PRN

## 2020-06-23 MED ORDER — LIDOCAINE-EPINEPHRINE (PF) 1 %-1:200000 IJ SOLN
INTRAMUSCULAR | Status: DC | PRN
  Administered 2020-06-23: 17 mL via SUBCUTANEOUS

## 2020-06-23 MED ORDER — CHLORHEXIDINE GLUCONATE CLOTH 2 % EX PADS: 6.0000 | MEDICATED_PAD | Freq: Once | CUTANEOUS | Status: DC

## 2020-06-23 MED ORDER — ACETAMINOPHEN 500 MG PO TABS
1000.0000 mg | ORAL_TABLET | Freq: Once | ORAL | Status: AC
  Administered 2020-06-23: 1000 mg via ORAL

## 2020-06-23 MED ORDER — PHENYLEPHRINE HCL (PRESSORS) 10 MG/ML IV SOLN
INTRAVENOUS | Status: DC | PRN
  Administered 2020-06-23 (×2): 80 ug via INTRAVENOUS

## 2020-06-23 MED ORDER — SCOPOLAMINE 1 MG/3DAYS TD PT72
MEDICATED_PATCH | TRANSDERMAL | Status: AC
  Filled 2020-06-23: qty 1

## 2020-06-23 MED ORDER — HYDROMORPHONE HCL 1 MG/ML IJ SOLN: 0.2500 mg | INTRAMUSCULAR | Status: DC | PRN

## 2020-06-23 MED ORDER — OXYCODONE HCL 5 MG PO TABS: 5.0000 mg | ORAL_TABLET | Freq: Four times a day (QID) | ORAL | 0 refills | Status: DC | PRN

## 2020-06-23 MED ORDER — LIDOCAINE-EPINEPHRINE (PF) 1 %-1:200000 IJ SOLN
INTRAMUSCULAR | Status: AC
  Filled 2020-06-23: qty 30

## 2020-06-23 MED ORDER — LIDOCAINE HCL (CARDIAC) PF 100 MG/5ML IV SOSY
PREFILLED_SYRINGE | INTRAVENOUS | Status: DC | PRN
  Administered 2020-06-23: 40 mg via INTRAVENOUS

## 2020-06-23 MED ORDER — HEPARIN (PORCINE) IN NACL 1000-0.9 UT/500ML-% IV SOLN
INTRAVENOUS | Status: AC
  Filled 2020-06-23: qty 500

## 2020-06-23 MED ORDER — FENTANYL CITRATE (PF) 100 MCG/2ML IJ SOLN
INTRAMUSCULAR | Status: DC | PRN
  Administered 2020-06-23 (×2): 50 ug via INTRAVENOUS

## 2020-06-23 MED ORDER — PROPOFOL 10 MG/ML IV BOLUS
INTRAVENOUS | Status: AC
  Filled 2020-06-23: qty 20

## 2020-06-23 MED ORDER — PROPOFOL 10 MG/ML IV BOLUS
INTRAVENOUS | Status: DC | PRN
  Administered 2020-06-23: 200 mg via INTRAVENOUS

## 2020-06-23 MED ORDER — BUPIVACAINE HCL (PF) 0.25 % IJ SOLN
INTRAMUSCULAR | Status: AC
  Filled 2020-06-23: qty 30

## 2020-06-23 MED ORDER — LIDOCAINE 2% (20 MG/ML) 5 ML SYRINGE
INTRAMUSCULAR | Status: AC
  Filled 2020-06-23: qty 5

## 2020-06-23 MED ORDER — PROMETHAZINE HCL 25 MG/ML IJ SOLN: 6.2500 mg | INTRAMUSCULAR | Status: DC | PRN

## 2020-06-23 MED ORDER — HEPARIN SOD (PORK) LOCK FLUSH 100 UNIT/ML IV SOLN
INTRAVENOUS | Status: AC
  Filled 2020-06-23: qty 5

## 2020-06-23 MED ORDER — FENTANYL CITRATE (PF) 100 MCG/2ML IJ SOLN
INTRAMUSCULAR | Status: AC
  Filled 2020-06-23: qty 2

## 2020-06-23 MED ORDER — HEPARIN SOD (PORK) LOCK FLUSH 100 UNIT/ML IV SOLN
INTRAVENOUS | Status: DC | PRN
  Administered 2020-06-23: 500 [IU]

## 2020-06-23 MED ORDER — PHENYLEPHRINE 40 MCG/ML (10ML) SYRINGE FOR IV PUSH (FOR BLOOD PRESSURE SUPPORT)
PREFILLED_SYRINGE | INTRAVENOUS | Status: AC
  Filled 2020-06-23: qty 10

## 2020-06-23 MED ORDER — SCOPOLAMINE 1 MG/3DAYS TD PT72
1.0000 | MEDICATED_PATCH | TRANSDERMAL | Status: DC
  Administered 2020-06-23: 1.5 mg via TRANSDERMAL

## 2020-06-23 MED ORDER — DEXAMETHASONE SODIUM PHOSPHATE 4 MG/ML IJ SOLN
INTRAMUSCULAR | Status: DC | PRN
  Administered 2020-06-23: 5 mg via INTRAVENOUS

## 2020-06-23 MED ORDER — MIDAZOLAM HCL 5 MG/5ML IJ SOLN
INTRAMUSCULAR | Status: DC | PRN
  Administered 2020-06-23: 2 mg via INTRAVENOUS

## 2020-06-23 MED ORDER — OXYCODONE HCL 5 MG/5ML PO SOLN: 5.0000 mg | Freq: Once | ORAL | Status: DC | PRN

## 2020-06-23 MED ORDER — ONDANSETRON HCL 4 MG/2ML IJ SOLN
INTRAMUSCULAR | Status: AC
  Filled 2020-06-23: qty 2

## 2020-06-23 MED ORDER — CEFAZOLIN SODIUM-DEXTROSE 2-4 GM/100ML-% IV SOLN
INTRAVENOUS | Status: AC
  Filled 2020-06-23: qty 100

## 2020-06-23 MED ORDER — ACETAMINOPHEN 500 MG PO TABS
ORAL_TABLET | ORAL | Status: AC
  Filled 2020-06-23: qty 2

## 2020-06-23 MED ORDER — CEFAZOLIN SODIUM-DEXTROSE 2-4 GM/100ML-% IV SOLN
2.0000 g | INTRAVENOUS | Status: AC
  Administered 2020-06-23: 2 g via INTRAVENOUS

## 2020-06-23 MED ORDER — CELECOXIB 200 MG PO CAPS
400.0000 mg | ORAL_CAPSULE | ORAL | Status: AC
  Administered 2020-06-23: 400 mg via ORAL

## 2020-06-23 MED ORDER — LACTATED RINGERS IV SOLN: INTRAVENOUS | Status: DC

## 2020-06-23 MED ORDER — ACETAMINOPHEN 500 MG PO TABS: 1000.0000 mg | ORAL_TABLET | ORAL | Status: AC

## 2020-06-23 MED ORDER — MIDAZOLAM HCL 2 MG/2ML IJ SOLN
INTRAMUSCULAR | Status: AC
  Filled 2020-06-23: qty 2

## 2020-06-23 MED ORDER — HEPARIN (PORCINE) IN NACL 2-0.9 UNITS/ML
INTRAMUSCULAR | Status: AC | PRN
  Administered 2020-06-23: 1 via INTRAVENOUS

## 2020-06-23 MED ORDER — CELECOXIB 200 MG PO CAPS
ORAL_CAPSULE | ORAL | Status: AC
  Filled 2020-06-23: qty 2

## 2020-06-23 MED ORDER — ONDANSETRON HCL 4 MG/2ML IJ SOLN
INTRAMUSCULAR | Status: DC | PRN
  Administered 2020-06-23: 4 mg via INTRAVENOUS

## 2020-06-23 SURGICAL SUPPLY — 43 items
ADH SKN CLS APL DERMABOND .7 (GAUZE/BANDAGES/DRESSINGS) ×1
APL PRP STRL LF DISP 70% ISPRP (MISCELLANEOUS) ×1
BAG DECANTER FOR FLEXI CONT (MISCELLANEOUS) ×2 IMPLANT
BLADE HEX COATED 2.75 (ELECTRODE) ×2 IMPLANT
BLADE SURG 11 STRL SS (BLADE) ×2 IMPLANT
BLADE SURG 15 STRL LF DISP TIS (BLADE) ×1 IMPLANT
BLADE SURG 15 STRL SS (BLADE) ×2
CHLORAPREP W/TINT 26 (MISCELLANEOUS) ×2 IMPLANT
COVER BACK TABLE 60X90IN (DRAPES) ×2 IMPLANT
COVER MAYO STAND STRL (DRAPES) ×2 IMPLANT
COVER WAND RF STERILE (DRAPES) IMPLANT
DECANTER SPIKE VIAL GLASS SM (MISCELLANEOUS) IMPLANT
DERMABOND ADVANCED (GAUZE/BANDAGES/DRESSINGS) ×1
DERMABOND ADVANCED .7 DNX12 (GAUZE/BANDAGES/DRESSINGS) ×1 IMPLANT
DRAPE C-ARM 42X72 X-RAY (DRAPES) ×2 IMPLANT
DRAPE LAPAROTOMY TRNSV 102X78 (DRAPES) ×2 IMPLANT
DRAPE UTILITY XL STRL (DRAPES) ×2 IMPLANT
DRSG TEGADERM 4X4.75 (GAUZE/BANDAGES/DRESSINGS) IMPLANT
ELECT REM PT RETURN 9FT ADLT (ELECTROSURGICAL) ×2
ELECTRODE REM PT RTRN 9FT ADLT (ELECTROSURGICAL) ×1 IMPLANT
GAUZE SPONGE 4X4 12PLY STRL LF (GAUZE/BANDAGES/DRESSINGS) IMPLANT
GLOVE BIO SURGEON STRL SZ 6 (GLOVE) ×2 IMPLANT
GLOVE BIOGEL PI IND STRL 6.5 (GLOVE) ×1 IMPLANT
GLOVE BIOGEL PI INDICATOR 6.5 (GLOVE) ×1
GOWN STRL REUS W/ TWL LRG LVL3 (GOWN DISPOSABLE) ×1 IMPLANT
GOWN STRL REUS W/TWL 2XL LVL3 (GOWN DISPOSABLE) ×2 IMPLANT
GOWN STRL REUS W/TWL LRG LVL3 (GOWN DISPOSABLE) ×2
IV CONNECTOR ONE LINK NDLESS (IV SETS) IMPLANT
KIT PORT POWER 8FR ISP CVUE (Port) ×1 IMPLANT
NDL HYPO 25X1 1.5 SAFETY (NEEDLE) ×1 IMPLANT
NEEDLE HYPO 25X1 1.5 SAFETY (NEEDLE) ×2 IMPLANT
PACK BASIN DAY SURGERY FS (CUSTOM PROCEDURE TRAY) ×2 IMPLANT
PENCIL SMOKE EVACUATOR (MISCELLANEOUS) ×2 IMPLANT
SLEEVE SCD COMPRESS KNEE MED (MISCELLANEOUS) ×2 IMPLANT
SUT MNCRL AB 4-0 PS2 18 (SUTURE) ×2 IMPLANT
SUT PROLENE 2 0 SH DA (SUTURE) ×4 IMPLANT
SUT VIC AB 3-0 SH 27 (SUTURE) ×2
SUT VIC AB 3-0 SH 27X BRD (SUTURE) ×1 IMPLANT
SUT VICRYL 3-0 CR8 SH (SUTURE) IMPLANT
SYR 10ML LL (SYRINGE) ×2 IMPLANT
SYR 5ML LUER SLIP (SYRINGE) ×2 IMPLANT
SYR CONTROL 10ML LL (SYRINGE) ×2 IMPLANT
TOWEL GREEN STERILE FF (TOWEL DISPOSABLE) ×2 IMPLANT

## 2020-06-23 NOTE — Anesthesia Postprocedure Evaluation (Signed)
Anesthesia Post Note  Patient: Industrial/product designer  Procedure(s) Performed: INSERTION PORT-A-CATH WITH ULTRASOUND GUIDANCE (Left Chest)     Patient location during evaluation: PACU Anesthesia Type: General Level of consciousness: awake and alert, oriented and patient cooperative Pain management: pain level controlled Vital Signs Assessment: post-procedure vital signs reviewed and stable Respiratory status: spontaneous breathing, nonlabored ventilation and respiratory function stable Cardiovascular status: blood pressure returned to baseline and stable Postop Assessment: no apparent nausea or vomiting Anesthetic complications: no   No complications documented.  Last Vitals:  Vitals:   06/23/20 1452 06/23/20 1500  BP:  126/84  Pulse:  (!) 103  Resp:  20  Temp:    SpO2: 99% 100%    Last Pain:  Vitals:   06/23/20 1447  TempSrc:   PainSc: 0-No pain                 Pervis Hocking

## 2020-06-23 NOTE — Transfer of Care (Signed)
Immediate Anesthesia Transfer of Care Note  Patient: Robin Kennedy  Procedure(s) Performed: INSERTION PORT-A-CATH WITH ULTRASOUND GUIDANCE (Left Chest)  Patient Location: PACU  Anesthesia Type:General  Level of Consciousness: awake, alert , oriented and patient cooperative  Airway & Oxygen Therapy: Patient Spontanous Breathing and Patient connected to face mask oxygen  Post-op Assessment: Report given to RN and Post -op Vital signs reviewed and stable  Post vital signs: Reviewed and stable  Last Vitals:  Vitals Value Taken Time  BP 124/78 06/23/20 1447  Temp    Pulse 111 06/23/20 1448  Resp 18 06/23/20 1448  SpO2 100 % 06/23/20 1448  Vitals shown include unvalidated device data.  Last Pain:  Vitals:   06/23/20 1313  TempSrc: Oral  PainSc: 0-No pain      Patients Stated Pain Goal: 1 (15/80/63 8685)  Complications: No complications documented.

## 2020-06-23 NOTE — Anesthesia Procedure Notes (Signed)
Procedure Name: LMA Insertion Performed by: Kenita Bines, [place], CRNA Pre-anesthesia Checklist: Patient identified, Emergency Drugs available, Suction available and Patient being monitored Patient Re-evaluated:Patient Re-evaluated prior to induction Oxygen Delivery Method: Circle system utilized Preoxygenation: Pre-oxygenation with 100% oxygen Induction Type: IV induction Ventilation: Mask ventilation without difficulty LMA: LMA inserted LMA Size: 4.0 Number of attempts: 1 Airway Equipment and Method: Bite block Placement Confirmation: positive ETCO2 Tube secured with: Tape Dental Injury: Teeth and Oropharynx as per pre-operative assessment        

## 2020-06-23 NOTE — Op Note (Signed)
PREOPERATIVE DIAGNOSIS:  Breast cancer     POSTOPERATIVE DIAGNOSIS:  Same     PROCEDURE: Left subclavian port placement, Bard ClearVue  Power Port, MRI safe, 8-French.      SURGEON:  [name], MD      ANESTHESIA:  General   FINDINGS:  Good venous return, easy flush, and tip of the catheter and   SVC 23.5 cm.      SPECIMEN:  None.      ESTIMATED BLOOD LOSS:  Minimal.      COMPLICATIONS:  None known.      PROCEDURE:  Pt was identified in the holding area and taken to   the operating room, where patient was placed supine on the operating room   table.  General anesthesia was induced.  Patient's arms were tucked and the upper   chest and neck were prepped and draped in sterile fashion.  Time-out was   performed according to the surgical safety check list.  When all was   correct, we continued.   Local anesthetic was administered over this   area at the angle of the clavicle.  The vein was accessed with 3 pass(es) of the needle. There was good venous return and the wire passed easily with no ectopy.   Fluoroscopy was used to confirm that the wire was in the vena cava.      The patient was placed back level and the area for the pocket was anethetized   with local anesthetic.  A 3-cm transverse incision was made with a #15   blade.  Cautery was used to divide the subcutaneous tissues down to the   pectoralis muscle.  An Army[employer] retractor was used to elevate the skin   while a pocket was created on top of the pectoralis fascia.  The port   was placed into the pocket to confirm that it was of adequate size.  The   catheter was preattached to the port.  The port was then secured to the   pectoralis fascia with four 2-0 Prolene sutures.  These were clamped and   not tied down yet.    The catheter was tunneled through to the wire exit   site.  The catheter was placed along the wire to determine what length it should be to be in the SVC.  The catheter was cut at 23.5 cm.  The  tunneler sheath and dilator were passed over the wire and the dilator and wire were removed.  The catheter was advanced through the tunneler sheath and the tunneler sheath was pulled away.  Care was taken to keep the catheter in the tunneler sheath as this occurred. This was advanced and the tunneler sheath was removed.  There was good venous   return and easy flush of the catheter.  The Prolene sutures were tied   down to the pectoral fascia.  The skin was reapproximated using 3-0   Vicryl interrupted deep dermal sutures.    Fluoroscopy was used to re-confirm good position of the catheter.  The skin   was then closed using 4-0 Monocryl in a subcuticular fashion.  The port was flushed with concentrated heparin flush as well.  The wounds were then cleaned, dried, and dressed with Dermabond.  The patient was awakened from anesthesia and taken to the PACU in stable condition.  Needle, sponge, and instrument counts were correct.               [name], MD

## 2020-06-23 NOTE — Anesthesia Preprocedure Evaluation (Addendum)
Anesthesia Evaluation  Patient identified by MRN, date of birth, ID band Patient awake    Reviewed: Allergy & Precautions, NPO status , Patient's Chart, lab work & pertinent test results  Airway Mallampati: II  TM Distance: >3 FB Neck ROM: Full    Dental no notable dental hx. (+) Teeth Intact, Dental Advisory Given   Pulmonary neg pulmonary ROS,    Pulmonary exam normal breath sounds clear to auscultation       Cardiovascular negative cardio ROS Normal cardiovascular exam Rhythm:Regular Rate:Normal     Neuro/Psych negative neurological ROS  negative psych ROS   GI/Hepatic negative GI ROS, Neg liver ROS,   Endo/Other  diabetes, Poorly Controlled, Type 2, Oral Hypoglycemic AgentsLast a1c 9.4 Obesity BMI 31  Renal/GU negative Renal ROS  negative genitourinary   Musculoskeletal negative musculoskeletal ROS (+)   Abdominal   Peds  Hematology negative hematology ROS (+)   Anesthesia Other Findings Left breast ca  Reproductive/Obstetrics negative OB ROS                            Anesthesia Physical Anesthesia Plan  ASA: II  Anesthesia Plan: General   Post-op Pain Management:    Induction: Intravenous  PONV Risk Score and Plan: 3 and Dexamethasone, Ondansetron, Midazolam, Treatment may vary due to age or medical condition and Scopolamine patch - Pre-op  Airway Management Planned: LMA  Additional Equipment: None  Intra-op Plan:   Post-operative Plan: Extubation in OR  Informed Consent: I have reviewed the patients History and Physical, chart, labs and discussed the procedure including the risks, benefits and alternatives for the proposed anesthesia with the patient or authorized representative who has indicated his/her understanding and acceptance.     Dental advisory given  Plan Discussed with: CRNA  Anesthesia Plan Comments:        Anesthesia Quick Evaluation

## 2020-06-23 NOTE — Progress Notes (Addendum)
Hypoglycemic Event  CBG: 65  Treatment: 8 oz juice/soda  Symptoms: None  Follow-up CBG: Time:1506 CBG Result:71  Possible Reasons for Event: Other: surgery'  Comments/MD notified: Dr. Delmar Landau

## 2020-06-23 NOTE — Interval H&P Note (Signed)
History and Physical Interval Note:  06/23/2020 1:29 PM  Robin Kennedy  has presented today for surgery, with the diagnosis of LEFT BREAST CANCER.  The various methods of treatment have been discussed with the patient and family. After consideration of risks, benefits and other options for treatment, the patient has consented to  Procedure(s): INSERTION PORT-A-CATH WITH ULTRASOUND GUIDANCE (N/A) as a surgical intervention.  The patient's history has been reviewed, patient examined, no change in status, stable for surgery.  I have reviewed the patient's chart and labs.  Questions were answered to the patient's satisfaction.     [name]

## 2020-06-23 NOTE — Anesthesia Procedure Notes (Signed)
Procedure Name: LMA Insertion Date/Time: 06/23/2020 1:52 PM Performed by: Signe Colt, CRNA Pre-anesthesia Checklist: Patient identified, Emergency Drugs available, Suction available and Patient being monitored Patient Re-evaluated:Patient Re-evaluated prior to induction Oxygen Delivery Method: Circle System Utilized Preoxygenation: Pre-oxygenation with 100% oxygen Induction Type: IV induction Ventilation: Mask ventilation without difficulty LMA: LMA inserted LMA Size: 4.0 Number of attempts: 1 Airway Equipment and Method: bite block Placement Confirmation: positive ETCO2 Tube secured with: Tape Dental Injury: Teeth and Oropharynx as per pre-operative assessment
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
## 2020-06-23 NOTE — Discharge Instructions (Addendum)
[place] Office Phone Number [phone]   POST OP INSTRUCTIONS  Always review your discharge instruction sheet given to you by the facility where your surgery was performed.  IF YOU HAVE DISABILITY OR FAMILY LEAVE FORMS, YOU MUST BRING THEM TO THE OFFICE FOR PROCESSING.  DO NOT GIVE THEM TO YOUR DOCTOR.  1. A prescription for pain medication may be given to you upon discharge.  Take your pain medication as prescribed, if needed.  If narcotic pain medicine is not needed, then you may take acetaminophen (Tylenol) or ibuprofen (Advil) as needed. 2. Take your usually prescribed medications unless otherwise directed 3. If you need a refill on your pain medication, please contact your pharmacy.  They will contact our office to request authorization.  Prescriptions will not be filled after 5pm or on week-ends. 4. You should eat very light the first 24 hours after surgery, such as soup, crackers, pudding, etc.  Resume your normal diet the day after surgery 5. It is common to experience some constipation if taking pain medication after surgery.  Increasing fluid intake and taking a stool softener will usually help or prevent this problem from occurring.  A mild laxative (Milk of Magnesia or Miralax) should be taken according to package directions if there are no bowel movements after 48 hours. 6. You may shower in 48 hours.  The surgical glue will flake off in 2-3 weeks.   7. ACTIVITIES:  No strenuous activity or heavy lifting for 1 week.   a. You may drive when you no longer are taking prescription pain medication, you can comfortably wear a seatbelt, and you can safely maneuver your car and apply brakes. b. RETURN TO WORK:  __________as tolerated if no lifting.  _______________ Robin Kennedy should see your doctor in the office for a follow-up appointment approximately three-four weeks after your surgery.    WHEN TO CALL YOUR DOCTOR: 1. Fever over 101.0 2. Nausea and/or  vomiting. 3. Extreme swelling or bruising. 4. Continued bleeding from incision. 5. Increased pain, redness, or drainage from the incision.  The clinic staff is available to answer your questions during regular business hours.  Please don't hesitate to call and ask to speak to one of the nurses for clinical concerns.  If you have a medical emergency, go to the nearest emergency room or call 911.  A surgeon from [facility] is always on call at the hospital.  For further questions, please visit [email or web address]     Post Anesthesia Home Care Instructions  Activity: Get plenty of rest for the remainder of the day. A responsible individual must stay with you for 24 hours following the procedure.  For the next 24 hours, DO NOT: -Drive a car -Paediatric nurse -Drink alcoholic beverages -Take any medication unless instructed by your physician -Make any legal decisions or sign important papers.  Meals: Start with liquid foods such as gelatin or soup. Progress to regular foods as tolerated. Avoid greasy, spicy, heavy foods. If nausea and/or vomiting occur, drink only clear liquids until the nausea and/or vomiting subsides. Call your physician if vomiting continues.  Special Instructions/Symptoms: Your throat may feel dry or sore from the anesthesia or the breathing tube placed in your throat during surgery. If this causes discomfort, gargle with warm salt water. The discomfort should disappear within 24 hours.  If you had a scopolamine patch placed behind your ear for the management of post- operative nausea and/or vomiting:  1. The medication in the patch is effective for  72 hours, after which it should be removed.  Wrap patch in a tissue and discard in the trash. Wash hands thoroughly with soap and water. 2. You may remove the patch earlier than 72 hours if you experience unpleasant side effects which may include dry mouth, dizziness or visual disturbances. 3. Avoid  touching the patch. Wash your hands with soap and water after contact with the patch.    May take Tylenol and Ibuprofen after 7:30pm, if needed.

## 2020-06-24 ENCOUNTER — Inpatient Hospital Stay (HOSPITAL_BASED_OUTPATIENT_CLINIC_OR_DEPARTMENT_OTHER): Payer: 59 | Admitting: Hematology and Oncology

## 2020-06-24 ENCOUNTER — Encounter (HOSPITAL_BASED_OUTPATIENT_CLINIC_OR_DEPARTMENT_OTHER): Payer: Self-pay | Admitting: General Surgery

## 2020-06-24 ENCOUNTER — Encounter: Payer: Self-pay | Admitting: Hematology and Oncology

## 2020-06-24 ENCOUNTER — Inpatient Hospital Stay: Payer: 59

## 2020-06-24 ENCOUNTER — Encounter: Payer: Self-pay | Admitting: Licensed Clinical Social Worker

## 2020-06-24 ENCOUNTER — Other Ambulatory Visit: Payer: Self-pay

## 2020-06-24 DIAGNOSIS — Z170 Estrogen receptor positive status [ER+]: Secondary | ICD-10-CM | POA: Diagnosis not present

## 2020-06-24 DIAGNOSIS — C50212 Malignant neoplasm of upper-inner quadrant of left female breast: Secondary | ICD-10-CM

## 2020-06-24 LAB — CBC WITH DIFFERENTIAL (CANCER CENTER ONLY)
Abs Immature Granulocytes: 0.01 10*3/uL (ref 0.00–0.07)
Basophils Absolute: 0 10*3/uL (ref 0.0–0.1)
Basophils Relative: 0 %
Eosinophils Absolute: 0.1 10*3/uL (ref 0.0–0.5)
Eosinophils Relative: 1 %
HCT: 35.1 % — ABNORMAL LOW (ref 36.0–46.0)
Hemoglobin: 11 g/dL — ABNORMAL LOW (ref 12.0–15.0)
Immature Granulocytes: 0 %
Lymphocytes Relative: 39 %
Lymphs Abs: 3.3 10*3/uL (ref 0.7–4.0)
MCH: 27.2 pg (ref 26.0–34.0)
MCHC: 31.3 g/dL (ref 30.0–36.0)
MCV: 86.7 fL (ref 80.0–100.0)
Monocytes Absolute: 0.5 10*3/uL (ref 0.1–1.0)
Monocytes Relative: 6 %
Neutro Abs: 4.4 10*3/uL (ref 1.7–7.7)
Neutrophils Relative %: 54 %
Platelet Count: 283 10*3/uL (ref 150–400)
RBC: 4.05 MIL/uL (ref 3.87–5.11)
RDW: 13.7 % (ref 11.5–15.5)
WBC Count: 8.4 10*3/uL (ref 4.0–10.5)
nRBC: 0 % (ref 0.0–0.2)

## 2020-06-24 LAB — CMP (CANCER CENTER ONLY)
ALT: 9 U/L (ref 0–44)
AST: 19 U/L (ref 15–41)
Albumin: 3.4 g/dL — ABNORMAL LOW (ref 3.5–5.0)
Alkaline Phosphatase: 37 U/L — ABNORMAL LOW (ref 38–126)
Anion gap: 9 (ref 5–15)
BUN: 13 mg/dL (ref 6–20)
CO2: 23 mmol/L (ref 22–32)
Calcium: 8.9 mg/dL (ref 8.9–10.3)
Chloride: 107 mmol/L (ref 98–111)
Creatinine: 0.81 mg/dL (ref 0.44–1.00)
GFR, Estimated: >60 mL/min (ref >60)
Glucose, Bld: 137 mg/dL — ABNORMAL HIGH (ref 70–99)
Potassium: 3.4 mmol/L — ABNORMAL LOW (ref 3.5–5.1)
Sodium: 139 mmol/L (ref 135–145)
Total Bilirubin: 0.5 mg/dL (ref 0.3–1.2)
Total Protein: 6.7 g/dL (ref 6.5–8.1)

## 2020-06-24 NOTE — Progress Notes (Signed)
Met with patient at registration to introduce myself as Arboriculturist and to offer available resources.  Discussed one-time $1000 Radio broadcast assistant to assist with personal expenses while going through treatment.  Also discussed available copay assistance for specific treatment drugs if insurance will leave with co-insurance.  Gave her my card if interested in applying and for any additional financial questions or concerns.

## 2020-06-24 NOTE — Assessment & Plan Note (Signed)
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 Right breast benign-appearing calcifications previous stereotactic biopsy was attempted but it was too superficial and felt to be benign.  Recommendation based on multidisciplinary tumor board: 1. Neoadjuvant chemotherapy with TCH Perjeta 6 cycles followed by Herceptin Perjeta versus Kadcyla maintenance for 1 year 2. Followed by breast conserving surgery if possible with sentinel lymph node study 3. Followed by adjuvant radiation therapy if patient had lumpectomy ------------------------------------------------------------------------------------------------------------------------------------------ Breast MRI 06/15/2020: Inflammatory left breast cancer changes, multicentric contiguous masses 10 cm max, 9 satellite nodules, 9 axillary lymph nodes, enlarged anterior mediastinal lymph node, indeterminate 1.1 cm mass right breast posterior depth  CT CAP 06/16/2020: Extensive cutaneous thickening left breast, metastatic adenopathy left axilla, left pectoralis, left supraclavicular.  No other distant metastatic disease.  Bone Scan 06/17/20: Solitary small but suspicious focus of increased radiotracer activity in the proximal shaft of the right humerus. Right shoulder/humerus MRI will be ordered.  ------------------------------------------------------------------------------------------------------------------------------------------------------- Current Treatment: Neoadjuvant chemotherapy with TCH Perjeta today is cycle 1 Echo 06/15/2020: EF 60 to 65% Labs reviewed Chemo education completed, chemo consent obtained Right shoulder MRI will be ordered Return to clinic in 1 week for toxicity check

## 2020-06-24 NOTE — Progress Notes (Signed)
[facility] CSW Progress Note  [employer] representative met with patient to discuss assistance available through breast cancer foundations due to concern about medical bills. Patient will look at them and apply as appropriate based on criteria.  CSW also discussed with patient about how to find out her deductible and out-of-pocket maximums from her insurance.  Patient will contact this CSW when ready to submit foundation applications or if any other questions arise.    Christeen Douglas , LCSW

## 2020-06-25 ENCOUNTER — Other Ambulatory Visit: Payer: Self-pay

## 2020-06-25 ENCOUNTER — Encounter: Payer: Self-pay | Admitting: *Deleted

## 2020-06-25 ENCOUNTER — Inpatient Hospital Stay: Payer: 59

## 2020-06-25 VITALS — BP 124/82 | HR 94 | Temp 98.3°F | Resp 18

## 2020-06-25 DIAGNOSIS — C50212 Malignant neoplasm of upper-inner quadrant of left female breast: Secondary | ICD-10-CM | POA: Diagnosis not present

## 2020-06-25 DIAGNOSIS — Z170 Estrogen receptor positive status [ER+]: Secondary | ICD-10-CM

## 2020-06-25 LAB — GLUCOSE, CAPILLARY: Glucose-Capillary: 82 mg/dL (ref 70–99)

## 2020-06-25 MED ORDER — PALONOSETRON HCL INJECTION 0.25 MG/5ML
INTRAVENOUS | Status: AC
  Filled 2020-06-25: qty 5

## 2020-06-25 MED ORDER — SODIUM CHLORIDE 0.9 % IV SOLN
Freq: Once | INTRAVENOUS | Status: AC
  Filled 2020-06-25: qty 250

## 2020-06-25 MED ORDER — SODIUM CHLORIDE 0.9 % IV SOLN
420.0000 mg | Freq: Once | INTRAVENOUS | Status: AC
  Administered 2020-06-25: 420 mg via INTRAVENOUS
  Filled 2020-06-25: qty 14

## 2020-06-25 MED ORDER — ACETAMINOPHEN 325 MG PO TABS
650.0000 mg | ORAL_TABLET | Freq: Once | ORAL | Status: AC
  Administered 2020-06-25: 650 mg via ORAL

## 2020-06-25 MED ORDER — SODIUM CHLORIDE 0.9% FLUSH
10.0000 mL | INTRAVENOUS | Status: DC | PRN
  Administered 2020-06-25: 10 mL
  Filled 2020-06-25: qty 10

## 2020-06-25 MED ORDER — SODIUM CHLORIDE 0.9 % IV SOLN
700.0000 mg | Freq: Once | INTRAVENOUS | Status: AC
  Administered 2020-06-25: 700 mg via INTRAVENOUS
  Filled 2020-06-25: qty 70

## 2020-06-25 MED ORDER — SODIUM CHLORIDE 0.9 % IV SOLN
10.0000 mg | Freq: Once | INTRAVENOUS | Status: AC
  Administered 2020-06-25: 10 mg via INTRAVENOUS
  Filled 2020-06-25: qty 10

## 2020-06-25 MED ORDER — TRASTUZUMAB-DKST CHEMO 150 MG IV SOLR
750.0000 mg | Freq: Once | INTRAVENOUS | Status: AC
  Administered 2020-06-25: 750 mg via INTRAVENOUS
  Filled 2020-06-25: qty 35.72

## 2020-06-25 MED ORDER — PALONOSETRON HCL INJECTION 0.25 MG/5ML
0.2500 mg | Freq: Once | INTRAVENOUS | Status: AC
  Administered 2020-06-25: 0.25 mg via INTRAVENOUS

## 2020-06-25 MED ORDER — DIPHENHYDRAMINE HCL 25 MG PO CAPS
ORAL_CAPSULE | ORAL | Status: AC
  Filled 2020-06-25: qty 2

## 2020-06-25 MED ORDER — DIPHENHYDRAMINE HCL 25 MG PO CAPS
50.0000 mg | ORAL_CAPSULE | Freq: Once | ORAL | Status: AC
  Administered 2020-06-25: 50 mg via ORAL

## 2020-06-25 MED ORDER — ACETAMINOPHEN 325 MG PO TABS
ORAL_TABLET | ORAL | Status: AC
  Filled 2020-06-25: qty 2

## 2020-06-25 MED ORDER — SODIUM CHLORIDE 0.9 % IV SOLN
75.0000 mg/m2 | Freq: Once | INTRAVENOUS | Status: AC
  Administered 2020-06-25: 160 mg via INTRAVENOUS
  Filled 2020-06-25: qty 16

## 2020-06-25 MED ORDER — SODIUM CHLORIDE 0.9 % IV SOLN
150.0000 mg | Freq: Once | INTRAVENOUS | Status: AC
  Administered 2020-06-25: 150 mg via INTRAVENOUS
  Filled 2020-06-25: qty 150

## 2020-06-25 MED ORDER — HEPARIN SOD (PORK) LOCK FLUSH 100 UNIT/ML IV SOLN
500.0000 [IU] | Freq: Once | INTRAVENOUS | Status: AC | PRN
  Administered 2020-06-25: 500 [IU]
  Filled 2020-06-25: qty 5

## 2020-06-25 NOTE — Progress Notes (Signed)
Patient had hysterectomy per obgyn notes back in 2018; therefore pregnancy test results not needed.

## 2020-06-25 NOTE — Patient Instructions (Signed)
[facility] Discharge Instructions for Patients Receiving Chemotherapy  Today you received the following chemotherapy agents: Trastuzumab, Pertuzumab, Docetaxel, and Carboplatin  To help prevent nausea and vomiting after your treatment, we encourage you to take your nausea medication as prescribed.    If you develop nausea and vomiting that is not controlled by your nausea medication, call the clinic.   BELOW ARE SYMPTOMS THAT SHOULD BE REPORTED IMMEDIATELY:  *FEVER GREATER THAN 100.5 F  *CHILLS WITH OR WITHOUT FEVER  NAUSEA AND VOMITING THAT IS NOT CONTROLLED WITH YOUR NAUSEA MEDICATION  *UNUSUAL SHORTNESS OF BREATH  *UNUSUAL BRUISING OR BLEEDING  TENDERNESS IN MOUTH AND THROAT WITH OR WITHOUT PRESENCE OF ULCERS  *URINARY PROBLEMS  *BOWEL PROBLEMS  UNUSUAL RASH Items with * indicate a potential emergency and should be followed up as soon as possible.  Feel free to call the clinic should you have any questions or concerns. The clinic phone number is [phone].  Please show the [place] [facility] at check-in to the Emergency Department and triage nurse.

## 2020-06-26 ENCOUNTER — Telehealth: Payer: Self-pay | Admitting: *Deleted

## 2020-06-26 NOTE — Telephone Encounter (Signed)
-----   Message from Jolaine Click, RN sent at 06/25/2020 10:16 AM EST ----- Regarding: First Time TCHP - Dr. Lindi Adie Patient First Time TCHP - Dr. Lindi Adie Patient

## 2020-06-26 NOTE — Telephone Encounter (Signed)
Called pt to see how she did with her chemo yest & she reports some fatigue last eve & diarrhea x 1 today.  Discussed diarrhea & imodium if needed.  Pt expressed understanding & knows to call with any problems.

## 2020-06-27 ENCOUNTER — Inpatient Hospital Stay: Payer: 59

## 2020-06-27 ENCOUNTER — Other Ambulatory Visit: Payer: Self-pay

## 2020-06-27 VITALS — BP 104/68 | HR 103 | Resp 20

## 2020-06-27 DIAGNOSIS — C50212 Malignant neoplasm of upper-inner quadrant of left female breast: Secondary | ICD-10-CM

## 2020-06-27 DIAGNOSIS — Z170 Estrogen receptor positive status [ER+]: Secondary | ICD-10-CM

## 2020-06-27 MED ORDER — PEGFILGRASTIM-JMDB 6 MG/0.6ML ~~LOC~~ SOSY
6.0000 mg | PREFILLED_SYRINGE | Freq: Once | SUBCUTANEOUS | Status: AC
  Administered 2020-06-27: 6 mg via SUBCUTANEOUS

## 2020-06-27 MED ORDER — PEGFILGRASTIM-JMDB 6 MG/0.6ML ~~LOC~~ SOSY
PREFILLED_SYRINGE | SUBCUTANEOUS | Status: AC
  Filled 2020-06-27: qty 0.6

## 2020-06-29 ENCOUNTER — Ambulatory Visit: Payer: Self-pay | Admitting: Genetic Counselor

## 2020-06-29 ENCOUNTER — Encounter: Payer: Self-pay | Admitting: Genetic Counselor

## 2020-06-29 ENCOUNTER — Telehealth: Payer: Self-pay | Admitting: Hematology and Oncology

## 2020-06-29 ENCOUNTER — Telehealth: Payer: Self-pay | Admitting: Genetic Counselor

## 2020-06-29 DIAGNOSIS — Z1379 Encounter for other screening for genetic and chromosomal anomalies: Secondary | ICD-10-CM | POA: Insufficient documentation

## 2020-06-29 DIAGNOSIS — Z803 Family history of malignant neoplasm of breast: Secondary | ICD-10-CM

## 2020-06-29 DIAGNOSIS — Z170 Estrogen receptor positive status [ER+]: Secondary | ICD-10-CM

## 2020-06-29 DIAGNOSIS — C50212 Malignant neoplasm of upper-inner quadrant of left female breast: Secondary | ICD-10-CM

## 2020-06-29 DIAGNOSIS — Z13220 Encounter for screening for lipoid disorders: Secondary | ICD-10-CM | POA: Insufficient documentation

## 2020-06-29 NOTE — Progress Notes (Signed)
HPI:  [name] was previously seen in the [place] clinic due to a personal history of breast cancer and concerns regarding a hereditary predisposition to cancer. Please refer to our prior cancer genetics clinic note for more information regarding our discussion, assessment and recommendations, at the time. [name] recent genetic test results were disclosed to her, as were recommendations warranted by these results. These results and recommendations are discussed in more detail below.  CANCER HISTORY:  Oncology History  Malignant neoplasm of upper-inner quadrant of left breast in female, estrogen receptor positive (Robin Kennedy)  06/05/2020 Initial Diagnosis   06/05/2020: Enlarging left breast with pain and diffuse skin thickening: Mammogram revealed 3.5 cm mass 11 o'clock position with 3 abnormal lymph nodes: 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 Right breast benign-appearing calcifications previous stereotactic biopsy was attempted but it was too superficial and felt to be benign.   06/25/2020 -  Chemotherapy   The patient had palonosetron (ALOXI) injection 0.25 mg, 0.25 mg, Intravenous,  Once, 1 of 6 cycles Administration: 0.25 mg (06/25/2020) pegfilgrastim-jmdb (FULPHILA) injection 6 mg, 6 mg, Subcutaneous,  Once, 1 of 6 cycles Administration: 6 mg (06/27/2020) CARBOplatin (PARAPLATIN) 700 mg in sodium chloride 0.9 % 250 mL chemo infusion, 700 mg (100 % of original dose 700 mg), Intravenous,  Once, 1 of 6 cycles Dose modification: 700 mg (original dose 700 mg, Cycle 1) Administration: 700 mg (06/25/2020) DOCEtaxel (TAXOTERE) 160 mg in sodium chloride 0.9 % 250 mL chemo infusion, 75 mg/m2 = 160 mg, Intravenous,  Once, 1 of 6 cycles Administration: 160 mg (06/25/2020) fosaprepitant (EMEND) 150 mg in sodium chloride 0.9 % 145 mL IVPB, 150 mg, Intravenous,  Once, 1 of 6 cycles Administration: 150 mg (06/25/2020) pertuzumab  (PERJETA) 420 mg in sodium chloride 0.9 % 250 mL chemo infusion, 420 mg (100 % of original dose 420 mg), Intravenous, Once, 1 of 6 cycles Dose modification: 420 mg (original dose 420 mg, Cycle 1, Reason: Provider Judgment) Administration: 420 mg (06/25/2020) trastuzumab-dkst (OGIVRI) 750 mg in sodium chloride 0.9 % 250 mL chemo infusion, 735 mg, Intravenous,  Once, 1 of 6 cycles Administration: 750 mg (06/25/2020)  for chemotherapy treatment.    06/29/2020 Genetic Testing   Negative genetic testing: no pathogenic variants detected in [facility] Common Hereditary Cancers Panel.  The report date is June 29, 2020.   The Common Hereditary Cancers Panel offered by [facility] includes sequencing and/or deletion duplication testing of the following 48 genes: APC, ATM, AXIN2, BARD1, BMPR1A, BRCA1, BRCA2, BRIP1, CDH1, CDK4, CDKN2A (p14ARF), CDKN2A (p16INK4a), CHEK2, CTNNA1, DICER1, EPCAM (Deletion/duplication testing only), GREM1 (promoter region deletion/duplication testing only), KIT, MEN1, MLH1, MSH2, MSH3, MSH6, MUTYH, NBN, NF1, NHTL1, PALB2, PDGFRA, PMS2, POLD1, POLE, PTEN, RAD50, RAD51C, RAD51D, RNF43, SDHB, SDHC, SDHD, SMAD4, SMARCA4. STK11, TP53, TSC1, TSC2, and VHL.  The following genes were evaluated for sequence changes only: SDHA and HOXB13 c.251G>A variant only.     FAMILY HISTORY:  We obtained a detailed, 4-generation family history.  Significant diagnoses are listed below: Family History  Problem Relation Age of Onset  . Breast cancer Paternal 52        unknown age     [name] has two sons and one daughter and several maternal and paternal half siblings, all of whom do not have a cancer history.  [name] mother passed away at age 20 and did not have a cancer history.  No maternal family history  of cancer was reported.  [name] father passed away at age 1 and did not have cancer.  [name]'s paternal grandmother was diagnosed with breast cancer at  an unknown age and passed away in her 65s.  No other paternal family history of cancer was reported.   [name] is unaware of previous family history of genetic testing for hereditary cancer risks. Patient's maternal ancestors are of African American descent, and paternal ancestors are of African American descent. There is no reported Ashkenazi Jewish ancestry. There is no known consanguinity.  GENETIC TEST RESULTS: Genetic testing reported out on June 29, 2020.  The Common Hereditary Cancers Panel found no pathogenic mutations. The Common Hereditary Cancers Panel offered by [facility] includes sequencing and/or deletion duplication testing of the following 48 genes: APC, ATM, AXIN2, BARD1, BMPR1A, BRCA1, BRCA2, BRIP1, CDH1, CDK4, CDKN2A (p14ARF), CDKN2A (p16INK4a), CHEK2, CTNNA1, DICER1, EPCAM (Deletion/duplication testing only), GREM1 (promoter region deletion/duplication testing only), KIT, MEN1, MLH1, MSH2, MSH3, MSH6, MUTYH, NBN, NF1, NHTL1, PALB2, PDGFRA, PMS2, POLD1, POLE, PTEN, RAD50, RAD51C, RAD51D, RNF43, SDHB, SDHC, SDHD, SMAD4, SMARCA4. STK11, TP53, TSC1, TSC2, and VHL.  The following genes were evaluated for sequence changes only: SDHA and HOXB13 c.251G>A variant only.   The test report has been scanned into EPIC and is located under the Molecular Pathology section of the Results Review tab.  A portion of the result report is included below for reference.     We discussed with [name] that because current genetic testing is not perfect, it is possible there may be a gene mutation in one of these genes that current testing cannot detect, but that chance is small.  We also discussed, that there could be another gene that has not yet been discovered, or that we have not yet tested, that is responsible for the cancer diagnoses in the family. It is also possible there is a hereditary cause for the cancer in the family that [name] did not inherit and therefore was not  identified in her testing.  Therefore, it is important to remain in touch with cancer genetics in the future so that we can continue to offer [name] the most up to date genetic testing.    ADDITIONAL GENETIC TESTING: We discussed with [name] that there are other genes that are associated with increased cancer risk that can be analyzed. Should [name] wish to pursue additional genetic testing, we are happy to discuss and coordinate this testing, at any time.    CANCER SCREENING RECOMMENDATIONS: [name] test result is considered negative (normal).  This means that we have not identified a hereditary cause for her personal and family history of breast cancer at this time. Most cancers happen by chance and this negative test suggests that her cancer may fall into this category.    While reassuring, this does not definitively rule out a hereditary predisposition to cancer. It is still possible that there could be genetic mutations that are undetectable by current technology. There could be genetic mutations in genes that have not been tested or identified to increase cancer risk.  Therefore, it is recommended she continue to follow the cancer management and screening guidelines provided by her oncology and primary healthcare provider.   An individual's cancer risk and medical management are not determined by genetic test results alone. Overall cancer risk assessment incorporates additional factors, including personal medical history, family history, and any available genetic information that may result in a personalized plan for cancer prevention  and surveillance  RECOMMENDATIONS FOR FAMILY MEMBERS:  Individuals in this family might be at some increased risk of developing cancer, over the general population risk, simply due to the family history of cancer.  We recommended women in this family have a yearly mammogram beginning at age 74, or 64 years younger than the earliest  onset of cancer, an annual clinical breast exam, and perform monthly breast self-exams. Women in this family should also have a gynecological exam as recommended by their primary provider. All family members should be referred for colonoscopy starting at age 71.  FOLLOW-UP: Lastly, we discussed with [name] that cancer genetics is a rapidly advancing field and it is possible that new genetic tests will be appropriate for her and/or her family members in the future. We encouraged her to remain in contact with cancer genetics on an annual basis so we can update her personal and family histories and let her know of advances in cancer genetics that may benefit this family.   Our contact number was provided. [name] questions were answered to her satisfaction, and she knows she is welcome to call us at anytime with additional questions or concerns.   Debra Calabretta M. Joette Catching, [place], [place] [email or web address] [email or web address] (P) [phone]

## 2020-06-29 NOTE — Telephone Encounter (Signed)
Revealed negative genetic testing.  Discussed that we do not know why she has breast cancer or why there is cancer in the family. It could be sporadic, due to a different gene that we are not testing, or maybe our current technology may not be able to pick something up.  It will be important for her to keep in contact with genetics to keep up with whether additional testing may be needed.

## 2020-06-29 NOTE — Telephone Encounter (Signed)
No 11/10 los, no changes made to pt schedule  

## 2020-06-30 ENCOUNTER — Encounter: Payer: Self-pay | Admitting: *Deleted

## 2020-07-01 ENCOUNTER — Other Ambulatory Visit: Payer: Self-pay

## 2020-07-01 ENCOUNTER — Inpatient Hospital Stay: Payer: 59

## 2020-07-01 DIAGNOSIS — Z23 Encounter for immunization: Secondary | ICD-10-CM

## 2020-07-01 DIAGNOSIS — C50212 Malignant neoplasm of upper-inner quadrant of left female breast: Secondary | ICD-10-CM | POA: Diagnosis not present

## 2020-07-01 NOTE — Progress Notes (Signed)
   Covid-19 Vaccination Clinic  Name:  Robin Kennedy    MRN: [id number] DOB: December 09, 1975  07/01/2020  [name] was observed post Covid-19 immunization for 15 minutes without incident. She was provided with Vaccine Information Sheet and instruction to access the V-Safe system.   [name] was instructed to call 911 with any severe reactions post vaccine: Marland Kitchen Difficulty breathing  . Swelling of face and throat  . A fast heartbeat  . A bad rash all over body  . Dizziness and weakness   Immunizations Administered    Name Date Dose VIS Date Route   Pfizer COVID-19 Vaccine 07/01/2020 11:47 AM 0.3 mL 06/03/2020 Intramuscular   Manufacturer: [place]   Lot: [id number]   NDC: [address]-9

## 2020-07-01 NOTE — Progress Notes (Signed)
Patient Care Team: Lavonia Drafts, MD as PCP - General (Obstetrics and Gynecology) Shawnee Knapp, MD (Family Medicine) Mauro Kaufmann, RN as Oncology Nurse Navigator Rockwell Germany, RN as Oncology Nurse Navigator [name], MD as Consulting Physician (General Surgery) Nicholas Lose, MD as Consulting Physician (Hematology and Oncology) Eppie Gibson, MD as Attending Physician (Radiation Oncology)  DIAGNOSIS:    ICD-10-CM   1. Malignant neoplasm of upper-inner quadrant of left breast in female, estrogen receptor positive ([place])  C50.212    Z17.0     SUMMARY OF ONCOLOGIC HISTORY: Oncology History  Malignant neoplasm of upper-inner quadrant of left breast in female, estrogen receptor positive ([place])  06/05/2020 Initial Diagnosis   06/05/2020: Enlarging left breast with pain and diffuse skin thickening: Mammogram revealed 3.5 cm mass 11 o'clock position with 3 abnormal lymph nodes: 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 Right breast benign-appearing calcifications previous stereotactic biopsy was attempted but it was too superficial and felt to be benign.   06/25/2020 -  Chemotherapy   The patient had palonosetron (ALOXI) injection 0.25 mg, 0.25 mg, Intravenous,  Once, 1 of 6 cycles Administration: 0.25 mg (06/25/2020) pegfilgrastim-jmdb (FULPHILA) injection 6 mg, 6 mg, Subcutaneous,  Once, 1 of 6 cycles Administration: 6 mg (06/27/2020) CARBOplatin (PARAPLATIN) 700 mg in sodium chloride 0.9 % 250 mL chemo infusion, 700 mg (100 % of original dose 700 mg), Intravenous,  Once, 1 of 6 cycles Dose modification: 700 mg (original dose 700 mg, Cycle 1) Administration: 700 mg (06/25/2020) DOCEtaxel (TAXOTERE) 160 mg in sodium chloride 0.9 % 250 mL chemo infusion, 75 mg/m2 = 160 mg, Intravenous,  Once, 1 of 6 cycles Administration: 160 mg (06/25/2020) fosaprepitant (EMEND) 150 mg in sodium chloride 0.9 % 145 mL IVPB, 150 mg, Intravenous,   Once, 1 of 6 cycles Administration: 150 mg (06/25/2020) pertuzumab (PERJETA) 420 mg in sodium chloride 0.9 % 250 mL chemo infusion, 420 mg (100 % of original dose 420 mg), Intravenous, Once, 1 of 6 cycles Dose modification: 420 mg (original dose 420 mg, Cycle 1, Reason: Provider Judgment) Administration: 420 mg (06/25/2020) trastuzumab-dkst (OGIVRI) 750 mg in sodium chloride 0.9 % 250 mL chemo infusion, 735 mg, Intravenous,  Once, 1 of 6 cycles Administration: 750 mg (06/25/2020)  for chemotherapy treatment.    06/29/2020 Genetic Testing   Negative genetic testing: no pathogenic variants detected in [facility] Common Hereditary Cancers Panel.  The report date is June 29, 2020.   The Common Hereditary Cancers Panel offered by [facility] includes sequencing and/or deletion duplication testing of the following 48 genes: APC, ATM, AXIN2, BARD1, BMPR1A, BRCA1, BRCA2, BRIP1, CDH1, CDK4, CDKN2A (p14ARF), CDKN2A (p16INK4a), CHEK2, CTNNA1, DICER1, EPCAM (Deletion/duplication testing only), GREM1 (promoter region deletion/duplication testing only), KIT, MEN1, MLH1, MSH2, MSH3, MSH6, MUTYH, NBN, NF1, NHTL1, PALB2, PDGFRA, PMS2, POLD1, POLE, PTEN, RAD50, RAD51C, RAD51D, RNF43, SDHB, SDHC, SDHD, SMAD4, SMARCA4. STK11, TP53, TSC1, TSC2, and VHL.  The following genes were evaluated for sequence changes only: SDHA and HOXB13 c.251G>A variant only.     CHIEF COMPLIANT: Cycle 1 Day 8 TCH Perjeta   INTERVAL HISTORY: Robin Kennedy is a 44 y.o. with above-mentioned history of inflammatory left breast cancer currently on neoadjuvant chemotherapy with TCH Perjeta. She presents to the clinic today for a toxicity check following cycle 1.  She had 2 days of diarrhea for which she took Imodium and the diarrhea subsided.  She has been drinking adequate amount of water but  today her heart rate is elevated.  Denies any nausea or vomiting.  She was doing her work yesterday and felt extremely weak and tired after the  work-up done.  She thinks that she was going apply for short-term disability.  ALLERGIES:  has No Known Allergies.  MEDICATIONS:  Current Outpatient Medications  Medication Sig Dispense Refill  . glimepiride (AMARYL) 4 MG tablet Take 4 mg by mouth 2 (two) times daily.  6  . glucose blood (ONE TOUCH ULTRA TEST) test strip Use to check cbgs qd 100 each 11  . ibuprofen (ADVIL,MOTRIN) 600 MG tablet Take 1 tablet (600 mg total) by mouth every 6 (six) hours as needed (mild pain). (Patient not taking: Reported on 05/27/2020) 30 tablet 0  . lidocaine-prilocaine (EMLA) cream Apply to affected area once 30 g 3  . LORazepam (ATIVAN) 0.5 MG tablet Take 1 tablet (0.5 mg total) by mouth at bedtime as needed for sleep. 30 tablet 1  . metFORMIN (GLUCOPHAGE-XR) 500 MG 24 hr tablet Take 1,000 mg by mouth 2 (two) times daily.  3  . ondansetron (ZOFRAN) 8 MG tablet Take 1 tablet (8 mg total) by mouth 2 (two) times daily as needed (Nausea or vomiting). Start on the third day after chemotherapy. 30 tablet 1  . oxyCODONE (OXY IR/ROXICODONE) 5 MG immediate release tablet Take 1 tablet (5 mg total) by mouth every 6 (six) hours as needed for severe pain. 10 tablet 0  . prochlorperazine (COMPAZINE) 10 MG tablet Take 1 tablet (10 mg total) by mouth every 6 (six) hours as needed (Nausea or vomiting). 30 tablet 1   No current facility-administered medications for this visit.    PHYSICAL EXAMINATION: ECOG PERFORMANCE STATUS: 1 - Symptomatic but completely ambulatory  Vitals:   07/02/20 1417  BP: 124/75  Pulse: (!) 121  Resp: 17  Temp: 99.8 F (37.7 C)  SpO2: 100%   Filed Weights   07/02/20 1417  Weight: 196 lb 14.4 oz (89.3 kg)     LABORATORY DATA:  I have reviewed the data as listed CMP Latest Ref Rng & Units 06/24/2020 06/22/2020 06/10/2020  Glucose 70 - 99 mg/dL 137(H) 199(H) 161(H)  BUN 6 - 20 mg/dL 13 5(L) 9  Creatinine 0.44 - 1.00 mg/dL 0.81 0.67 0.78  Sodium 135 - 145 mmol/L 139 139 139    Potassium 3.5 - 5.1 mmol/L 3.4(L) 4.0 4.0  Chloride 98 - 111 mmol/L 107 105 105  CO2 22 - 32 mmol/L '23 24 26  ' Calcium 8.9 - 10.3 mg/dL 8.9 9.0 9.6  Total Protein 6.5 - 8.1 g/dL 6.7 - 6.6  Total Bilirubin 0.3 - 1.2 mg/dL 0.5 - 0.6  Alkaline Phos 38 - 126 U/L 37(L) - 43  AST 15 - 41 U/L 19 - 8(L)  ALT 0 - 44 U/L 9 - 7    Lab Results  Component Value Date   WBC 27.4 (H) 07/02/2020   HGB 11.2 (L) 07/02/2020   HCT 34.8 (L) 07/02/2020   MCV 84.9 07/02/2020   PLT 252 07/02/2020   NEUTROABS PENDING 07/02/2020    ASSESSMENT & PLAN:  Malignant neoplasm of upper-inner quadrant of left breast in female, estrogen receptor positive ([place]) 06/05/2020: Enlarging left breast with pain and diffuse skin thickening: Mammogram revealed 3.5 cm mass 11 o'clock position with 3 abnormal lymph nodes: 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 Right breast benign-appearing calcifications previous stereotactic biopsy was attempted but it was too  superficial and felt to be benign.  Recommendationbased on multidisciplinary tumor board: 1. Neoadjuvant chemotherapy with TCH Perjeta 6 cycles followed by Herceptin Perjeta versus Kadcyla maintenance for 1 year 2. Followed by breast conserving surgery if possible with sentinel lymph node study 3. Followed by adjuvant radiation therapy if patient had lumpectomy ------------------------------------------------------------------------------------------------------------------------------------------ Breast MRI 06/15/2020: Inflammatory left breast cancer changes, multicentric contiguous masses 10 cm max, 9 satellite nodules, 9 axillary lymph nodes, enlarged anterior mediastinal lymph node, indeterminate 1.1 cm mass right breast posterior depth  CT CAP 06/16/2020: Extensive cutaneous thickening left breast, metastatic adenopathy left axilla, left pectoralis, left supraclavicular. No other distant metastatic disease.  Bone Scan  06/17/20: Solitary small but suspicious focus of increased radiotracer activity in the proximal shaft of the right humerus. Right shoulder/humerus MRI will be ordered.  ------------------------------------------------------------------------------------------------------------------------------------------------------- Current Treatment: Neoadjuvant chemotherapy with TCH Perjeta today cycle 1 day 8 toxicity evaluation Echo 06/15/2020: EF 60 to 65% Labs reviewed Chemo toxicities:  1.  Diarrhea that lasted for 2 days.  She took Imodium and it helped. 2. fatigue related to chemo Blood work has been reviewed.  She has leukocytosis from the Udenyca injection. The chemo dosing will remain the same.  Monitoring closely for chemo toxicities. Return to clinic in 2 weeks for cycle 2    No orders of the defined types were placed in this encounter.  The patient has a good understanding of the overall plan. she agrees with it. she will call with any problems that may develop before the next visit here.  Total time spent: 30 mins including face to face time and time spent for planning, charting and coordination of care  Nicholas Lose, MD 07/02/2020  I, Cloyde Reams Dorshimer, am acting as scribe for Dr. Nicholas Lose.  I have reviewed the above documentation for accuracy and completeness, and I agree with the above.

## 2020-07-02 ENCOUNTER — Inpatient Hospital Stay: Payer: 59

## 2020-07-02 ENCOUNTER — Other Ambulatory Visit: Payer: Self-pay

## 2020-07-02 ENCOUNTER — Encounter: Payer: Self-pay | Admitting: *Deleted

## 2020-07-02 ENCOUNTER — Inpatient Hospital Stay (HOSPITAL_BASED_OUTPATIENT_CLINIC_OR_DEPARTMENT_OTHER): Payer: 59 | Admitting: Hematology and Oncology

## 2020-07-02 DIAGNOSIS — Z170 Estrogen receptor positive status [ER+]: Secondary | ICD-10-CM

## 2020-07-02 DIAGNOSIS — C50212 Malignant neoplasm of upper-inner quadrant of left female breast: Secondary | ICD-10-CM

## 2020-07-02 DIAGNOSIS — Z95828 Presence of other vascular implants and grafts: Secondary | ICD-10-CM | POA: Insufficient documentation

## 2020-07-02 LAB — CBC WITH DIFFERENTIAL (CANCER CENTER ONLY)
Abs Immature Granulocytes: 3.57 10*3/uL — ABNORMAL HIGH (ref 0.00–0.07)
Basophils Absolute: 0 10*3/uL (ref 0.0–0.1)
Basophils Relative: 0 %
Eosinophils Absolute: 0 10*3/uL (ref 0.0–0.5)
Eosinophils Relative: 0 %
HCT: 34.8 % — ABNORMAL LOW (ref 36.0–46.0)
Hemoglobin: 11.2 g/dL — ABNORMAL LOW (ref 12.0–15.0)
Immature Granulocytes: 13 %
Lymphocytes Relative: 11 %
Lymphs Abs: 2.9 10*3/uL (ref 0.7–4.0)
MCH: 27.3 pg (ref 26.0–34.0)
MCHC: 32.2 g/dL (ref 30.0–36.0)
MCV: 84.9 fL (ref 80.0–100.0)
Monocytes Absolute: 2 10*3/uL — ABNORMAL HIGH (ref 0.1–1.0)
Monocytes Relative: 7 %
Neutro Abs: 19 10*3/uL — ABNORMAL HIGH (ref 1.7–7.7)
Neutrophils Relative %: 69 %
Platelet Count: 252 10*3/uL (ref 150–400)
RBC: 4.1 MIL/uL (ref 3.87–5.11)
RDW: 13.3 % (ref 11.5–15.5)
WBC Count: 27.4 10*3/uL — ABNORMAL HIGH (ref 4.0–10.5)
WBC Morphology: INCREASED
nRBC: 0.1 % (ref 0.0–0.2)

## 2020-07-02 LAB — CMP (CANCER CENTER ONLY)
ALT: 13 U/L (ref 0–44)
AST: 17 U/L (ref 15–41)
Albumin: 3.9 g/dL (ref 3.5–5.0)
Alkaline Phosphatase: 87 U/L (ref 38–126)
Anion gap: 10 (ref 5–15)
BUN: 8 mg/dL (ref 6–20)
CO2: 23 mmol/L (ref 22–32)
Calcium: 9.4 mg/dL (ref 8.9–10.3)
Chloride: 102 mmol/L (ref 98–111)
Creatinine: 0.93 mg/dL (ref 0.44–1.00)
GFR, Estimated: >60 mL/min (ref >60)
Glucose, Bld: 254 mg/dL — ABNORMAL HIGH (ref 70–99)
Potassium: 4.1 mmol/L (ref 3.5–5.1)
Sodium: 135 mmol/L (ref 135–145)
Total Bilirubin: 0.4 mg/dL (ref 0.3–1.2)
Total Protein: 7.6 g/dL (ref 6.5–8.1)

## 2020-07-02 MED ORDER — HEPARIN SOD (PORK) LOCK FLUSH 100 UNIT/ML IV SOLN
500.0000 [IU] | Freq: Once | INTRAVENOUS | Status: AC
  Administered 2020-07-02: 500 [IU]
  Filled 2020-07-02: qty 5

## 2020-07-02 MED ORDER — SODIUM CHLORIDE 0.9% FLUSH
10.0000 mL | Freq: Once | INTRAVENOUS | Status: AC
  Administered 2020-07-02: 10 mL
  Filled 2020-07-02: qty 10

## 2020-07-02 NOTE — Assessment & Plan Note (Signed)
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 Right breast benign-appearing calcifications previous stereotactic biopsy was attempted but it was too superficial and felt to be benign.  Recommendationbased on multidisciplinary tumor board: 1. Neoadjuvant chemotherapy with TCH Perjeta 6 cycles followed by Herceptin Perjeta versus Kadcyla maintenance for 1 year 2. Followed by breast conserving surgery if possible with sentinel lymph node study 3. Followed by adjuvant radiation therapy if patient had lumpectomy ------------------------------------------------------------------------------------------------------------------------------------------ Breast MRI 06/15/2020: Inflammatory left breast cancer changes, multicentric contiguous masses 10 cm max, 9 satellite nodules, 9 axillary lymph nodes, enlarged anterior mediastinal lymph node, indeterminate 1.1 cm mass right breast posterior depth  CT CAP 06/16/2020: Extensive cutaneous thickening left breast, metastatic adenopathy left axilla, left pectoralis, left supraclavicular. No other distant metastatic disease.  Bone Scan 06/17/20: Solitary small but suspicious focus of increased radiotracer activity in the proximal shaft of the right humerus. Right shoulder/humerus MRI will be ordered.  ------------------------------------------------------------------------------------------------------------------------------------------------------- Current Treatment: Neoadjuvant chemotherapy with TCH Perjeta today cycle 1 day 8 toxicity evaluation Echo 06/15/2020: EF 60 to 65% Labs reviewed Chemo toxicities:   Return to clinic in 2 weeks for cycle 2

## 2020-07-04 ENCOUNTER — Telehealth: Payer: Self-pay | Admitting: Hematology and Oncology

## 2020-07-04 NOTE — Telephone Encounter (Signed)
No 11/18 los, no changes made to pt schedule

## 2020-07-14 NOTE — Progress Notes (Signed)
Patient Care Team: Lavonia Drafts, MD as PCP - General (Obstetrics and Gynecology) Shawnee Knapp, MD (Family Medicine) Mauro Kaufmann, RN as Oncology Nurse Navigator Rockwell Germany, RN as Oncology Nurse Navigator [name], MD as Consulting Physician (General Surgery) Nicholas Lose, MD as Consulting Physician (Hematology and Oncology) Eppie Gibson, MD as Attending Physician (Radiation Oncology)  DIAGNOSIS:    ICD-10-CM   1. Malignant neoplasm of upper-inner quadrant of left breast in female, estrogen receptor positive ([place])  C50.212    Z17.0     SUMMARY OF ONCOLOGIC HISTORY: Oncology History  Malignant neoplasm of upper-inner quadrant of left breast in female, estrogen receptor positive ([place])  06/05/2020 Initial Diagnosis   06/05/2020: Enlarging left breast with pain and diffuse skin thickening: Mammogram revealed 3.5 cm mass 11 o'clock position with 3 abnormal lymph nodes: 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 Right breast benign-appearing calcifications previous stereotactic biopsy was attempted but it was too superficial and felt to be benign.   06/25/2020 -  Chemotherapy   The patient had palonosetron (ALOXI) injection 0.25 mg, 0.25 mg, Intravenous,  Once, 1 of 6 cycles Administration: 0.25 mg (06/25/2020) pegfilgrastim-jmdb (FULPHILA) injection 6 mg, 6 mg, Subcutaneous,  Once, 1 of 6 cycles Administration: 6 mg (06/27/2020) CARBOplatin (PARAPLATIN) 700 mg in sodium chloride 0.9 % 250 mL chemo infusion, 700 mg (100 % of original dose 700 mg), Intravenous,  Once, 1 of 6 cycles Dose modification: 700 mg (original dose 700 mg, Cycle 1) Administration: 700 mg (06/25/2020) DOCEtaxel (TAXOTERE) 160 mg in sodium chloride 0.9 % 250 mL chemo infusion, 75 mg/m2 = 160 mg, Intravenous,  Once, 1 of 6 cycles Administration: 160 mg (06/25/2020) fosaprepitant (EMEND) 150 mg in sodium chloride 0.9 % 145 mL IVPB, 150 mg, Intravenous,   Once, 1 of 6 cycles Administration: 150 mg (06/25/2020) pertuzumab (PERJETA) 420 mg in sodium chloride 0.9 % 250 mL chemo infusion, 420 mg (100 % of original dose 420 mg), Intravenous, Once, 1 of 6 cycles Dose modification: 420 mg (original dose 420 mg, Cycle 1, Reason: Provider Judgment) Administration: 420 mg (06/25/2020) trastuzumab-dkst (OGIVRI) 750 mg in sodium chloride 0.9 % 250 mL chemo infusion, 735 mg, Intravenous,  Once, 1 of 6 cycles Administration: 750 mg (06/25/2020)  for chemotherapy treatment.    06/29/2020 Genetic Testing   Negative genetic testing: no pathogenic variants detected in [facility] Common Hereditary Cancers Panel.  The report date is June 29, 2020.   The Common Hereditary Cancers Panel offered by [facility] includes sequencing and/or deletion duplication testing of the following 48 genes: APC, ATM, AXIN2, BARD1, BMPR1A, BRCA1, BRCA2, BRIP1, CDH1, CDK4, CDKN2A (p14ARF), CDKN2A (p16INK4a), CHEK2, CTNNA1, DICER1, EPCAM (Deletion/duplication testing only), GREM1 (promoter region deletion/duplication testing only), KIT, MEN1, MLH1, MSH2, MSH3, MSH6, MUTYH, NBN, NF1, NHTL1, PALB2, PDGFRA, PMS2, POLD1, POLE, PTEN, RAD50, RAD51C, RAD51D, RNF43, SDHB, SDHC, SDHD, SMAD4, SMARCA4. STK11, TP53, TSC1, TSC2, and VHL.  The following genes were evaluated for sequence changes only: SDHA and HOXB13 c.251G>A variant only.     CHIEF COMPLIANT: Cycle 2 TCH Perjeta  INTERVAL HISTORY: Robin Kennedy is a 44 y.o. with above-mentioned history of inflammatory left breast cancercurrently on neoadjuvant chemotherapy with TCH Perjeta.She presents to the clinic todayfor a toxicity check and cycle 2.  she had few episodes of loose stools but no real diarrhea.  She did not require Imodium.  Her taste and appetite came back.  Her primary care physician started her  on insulin to control her blood sugars.  ALLERGIES:  has No Known Allergies.  MEDICATIONS:  Current Outpatient Medications   Medication Sig Dispense Refill  . glimepiride (AMARYL) 4 MG tablet Take 4 mg by mouth 2 (two) times daily.  6  . glucose blood (ONE TOUCH ULTRA TEST) test strip Use to check cbgs qd 100 each 11  . ibuprofen (ADVIL,MOTRIN) 600 MG tablet Take 1 tablet (600 mg total) by mouth every 6 (six) hours as needed (mild pain). (Patient not taking: Reported on 05/27/2020) 30 tablet 0  . lidocaine-prilocaine (EMLA) cream Apply to affected area once 30 g 3  . LORazepam (ATIVAN) 0.5 MG tablet Take 1 tablet (0.5 mg total) by mouth at bedtime as needed for sleep. 30 tablet 1  . metFORMIN (GLUCOPHAGE-XR) 500 MG 24 hr tablet Take 1,000 mg by mouth 2 (two) times daily.  3  . ondansetron (ZOFRAN) 8 MG tablet Take 1 tablet (8 mg total) by mouth 2 (two) times daily as needed (Nausea or vomiting). Start on the third day after chemotherapy. 30 tablet 1  . oxyCODONE (OXY IR/ROXICODONE) 5 MG immediate release tablet Take 1 tablet (5 mg total) by mouth every 6 (six) hours as needed for severe pain. 10 tablet 0  . prochlorperazine (COMPAZINE) 10 MG tablet Take 1 tablet (10 mg total) by mouth every 6 (six) hours as needed (Nausea or vomiting). 30 tablet 1   No current facility-administered medications for this visit.    PHYSICAL EXAMINATION: ECOG PERFORMANCE STATUS: 1 - Symptomatic but completely ambulatory  There were no vitals filed for this visit. There were no vitals filed for this visit.   LABORATORY DATA:  I have reviewed the data as listed CMP Latest Ref Rng & Units 07/02/2020 06/24/2020 06/22/2020  Glucose 70 - 99 mg/dL 254(H) 137(H) 199(H)  BUN 6 - 20 mg/dL 8 13 5(L)  Creatinine 0.44 - 1.00 mg/dL 0.93 0.81 0.67  Sodium 135 - 145 mmol/L 135 139 139  Potassium 3.5 - 5.1 mmol/L 4.1 3.4(L) 4.0  Chloride 98 - 111 mmol/L 102 107 105  CO2 22 - 32 mmol/L '23 23 24  ' Calcium 8.9 - 10.3 mg/dL 9.4 8.9 9.0  Total Protein 6.5 - 8.1 g/dL 7.6 6.7 -  Total Bilirubin 0.3 - 1.2 mg/dL 0.4 0.5 -  Alkaline Phos 38 - 126 U/L  87 37(L) -  AST 15 - 41 U/L 17 19 -  ALT 0 - 44 U/L 13 9 -    Lab Results  Component Value Date   WBC 6.4 07/15/2020   HGB 10.4 (L) 07/15/2020   HCT 33.3 (L) 07/15/2020   MCV 86.0 07/15/2020   PLT 288 07/15/2020   NEUTROABS 3.7 07/15/2020    ASSESSMENT & PLAN:  Malignant neoplasm of upper-inner quadrant of left breast in female, estrogen receptor positive ([place]) 06/05/2020: Enlarging left breast with pain and diffuse skin thickening: Mammogram revealed 3.5 cm mass 11 o'clock position with 3 abnormal lymph nodes: 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 Right breast benign-appearing calcifications previous stereotactic biopsy was attempted but it was too superficial and felt to be benign.  Recommendationbased on multidisciplinary tumor board: 1. Neoadjuvant chemotherapy with TCH Perjeta 6 cycles followed by Herceptin Perjeta versus Kadcyla maintenance for 1 year 2. Followed by breast conserving surgery if possible with sentinel lymph node study 3. Followed by adjuvant radiation therapy if patient had lumpectomy ------------------------------------------------------------------------------------------------------------------------------------------ Breast MRI 06/15/2020: Inflammatory left breast cancer  changes, multicentric contiguous masses 10 cm max, 9 satellite nodules, 9 axillary lymph nodes, enlarged anterior mediastinal lymph node, indeterminate 1.1 cm mass right breast posterior depth  CT CAP 06/16/2020: Extensive cutaneous thickening left breast, metastatic adenopathy left axilla, left pectoralis, left supraclavicular. No other distant metastatic disease.  Bone Scan 06/17/20:Solitary small but suspicious focus of increased radiotracer activity in the proximal shaft of the right humerus. Right shoulder/humerus MRIwill be  ordered. ------------------------------------------------------------------------------------------------------------------------------------------------------- Current Treatment:Neoadjuvant chemotherapy with [place] Perjeta today is cycle 2  Echo 06/15/2020: EF 60 to 65% Labs reviewed  Chemo toxicities: 1.  Diarrhea that lasted for 2 days.  She took Imodium and it helped. 2. fatigue related to chemo 3.  Chemo induced anemia: Today's hemoglobin is 7.4.  Monitoring  Blood work has been reviewed.  The chemo dosing will remain the same.  Diabetes: Her primary care physician started her on insulin.  This will hopefully control her blood sugars.  Monitoring closely for chemo toxicities. Return to clinic in 3 weeks for cycle 3    No orders of the defined types were placed in this encounter.  The patient has a good understanding of the overall plan. she agrees with it. she will call with any problems that may develop before the next visit here.  Total time spent: 30 mins including face to face time and time spent for planning, charting and coordination of care  Nicholas Lose, MD 07/15/2020  I, Cloyde Reams Dorshimer, am acting as scribe for Dr. Nicholas Lose.  I have reviewed the above documentation for accuracy and completeness, and I agree with the above.

## 2020-07-14 NOTE — Assessment & Plan Note (Signed)
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 Right breast benign-appearing calcifications previous stereotactic biopsy was attempted but it was too superficial and felt to be benign.  Recommendationbased on multidisciplinary tumor board: 1. Neoadjuvant chemotherapy with TCH Perjeta 6 cycles followed by Herceptin Perjeta versus Kadcyla maintenance for 1 year 2. Followed by breast conserving surgery if possible with sentinel lymph node study 3. Followed by adjuvant radiation therapy if patient had lumpectomy ------------------------------------------------------------------------------------------------------------------------------------------ Breast MRI 06/15/2020: Inflammatory left breast cancer changes, multicentric contiguous masses 10 cm max, 9 satellite nodules, 9 axillary lymph nodes, enlarged anterior mediastinal lymph node, indeterminate 1.1 cm mass right breast posterior depth  CT CAP 06/16/2020: Extensive cutaneous thickening left breast, metastatic adenopathy left axilla, left pectoralis, left supraclavicular. No other distant metastatic disease.  Bone Scan 06/17/20:Solitary small but suspicious focus of increased radiotracer activity in the proximal shaft of the right humerus. Right shoulder/humerus MRIwill be ordered. ------------------------------------------------------------------------------------------------------------------------------------------------------- Current Treatment:Neoadjuvant chemotherapy with [place] Perjeta today is cycle 2  Echo 06/15/2020: EF 60 to 65% Labs reviewed  Chemo toxicities: 1.  Diarrhea that lasted for 2 days.  She took Imodium and it helped. 2. fatigue related to chemo Blood work has been reviewed.  She has leukocytosis from the Udenyca injection. The  chemo dosing will remain the same.  Monitoring closely for chemo toxicities. Return to clinic in 3 weeks for cycle 3

## 2020-07-15 ENCOUNTER — Inpatient Hospital Stay: Payer: 59 | Attending: Hematology and Oncology

## 2020-07-15 ENCOUNTER — Inpatient Hospital Stay: Payer: 59

## 2020-07-15 ENCOUNTER — Other Ambulatory Visit: Payer: Self-pay

## 2020-07-15 ENCOUNTER — Inpatient Hospital Stay (HOSPITAL_BASED_OUTPATIENT_CLINIC_OR_DEPARTMENT_OTHER): Payer: 59 | Admitting: Hematology and Oncology

## 2020-07-15 DIAGNOSIS — Z170 Estrogen receptor positive status [ER+]: Secondary | ICD-10-CM | POA: Insufficient documentation

## 2020-07-15 DIAGNOSIS — C50212 Malignant neoplasm of upper-inner quadrant of left female breast: Secondary | ICD-10-CM

## 2020-07-15 DIAGNOSIS — R197 Diarrhea, unspecified: Secondary | ICD-10-CM | POA: Insufficient documentation

## 2020-07-15 DIAGNOSIS — D6481 Anemia due to antineoplastic chemotherapy: Secondary | ICD-10-CM | POA: Insufficient documentation

## 2020-07-15 DIAGNOSIS — Z79899 Other long term (current) drug therapy: Secondary | ICD-10-CM | POA: Diagnosis not present

## 2020-07-15 DIAGNOSIS — E876 Hypokalemia: Secondary | ICD-10-CM | POA: Insufficient documentation

## 2020-07-15 DIAGNOSIS — Z5111 Encounter for antineoplastic chemotherapy: Secondary | ICD-10-CM | POA: Insufficient documentation

## 2020-07-15 LAB — CBC WITH DIFFERENTIAL (CANCER CENTER ONLY)
Abs Immature Granulocytes: 0.03 10*3/uL (ref 0.00–0.07)
Basophils Absolute: 0.1 10*3/uL (ref 0.0–0.1)
Basophils Relative: 1 %
Eosinophils Absolute: 0 10*3/uL (ref 0.0–0.5)
Eosinophils Relative: 0 %
HCT: 33.3 % — ABNORMAL LOW (ref 36.0–46.0)
Hemoglobin: 10.4 g/dL — ABNORMAL LOW (ref 12.0–15.0)
Immature Granulocytes: 1 %
Lymphocytes Relative: 32 %
Lymphs Abs: 2 10*3/uL (ref 0.7–4.0)
MCH: 26.9 pg (ref 26.0–34.0)
MCHC: 31.2 g/dL (ref 30.0–36.0)
MCV: 86 fL (ref 80.0–100.0)
Monocytes Absolute: 0.5 10*3/uL (ref 0.1–1.0)
Monocytes Relative: 8 %
Neutro Abs: 3.7 10*3/uL (ref 1.7–7.7)
Neutrophils Relative %: 58 %
Platelet Count: 288 10*3/uL (ref 150–400)
RBC: 3.87 MIL/uL (ref 3.87–5.11)
RDW: 14.3 % (ref 11.5–15.5)
WBC Count: 6.4 10*3/uL (ref 4.0–10.5)
nRBC: 0 % (ref 0.0–0.2)

## 2020-07-15 LAB — CMP (CANCER CENTER ONLY)
ALT: 8 U/L (ref 0–44)
AST: 9 U/L — ABNORMAL LOW (ref 15–41)
Albumin: 3.6 g/dL (ref 3.5–5.0)
Alkaline Phosphatase: 53 U/L (ref 38–126)
Anion gap: 7 (ref 5–15)
BUN: 6 mg/dL (ref 6–20)
CO2: 24 mmol/L (ref 22–32)
Calcium: 9.1 mg/dL (ref 8.9–10.3)
Chloride: 110 mmol/L (ref 98–111)
Creatinine: 0.7 mg/dL (ref 0.44–1.00)
GFR, Estimated: >60 mL/min (ref >60)
Glucose, Bld: 114 mg/dL — ABNORMAL HIGH (ref 70–99)
Potassium: 3.5 mmol/L (ref 3.5–5.1)
Sodium: 141 mmol/L (ref 135–145)
Total Bilirubin: 0.5 mg/dL (ref 0.3–1.2)
Total Protein: 6.7 g/dL (ref 6.5–8.1)

## 2020-07-15 MED ORDER — SODIUM CHLORIDE 0.9 % IV SOLN
420.0000 mg | Freq: Once | INTRAVENOUS | Status: AC
  Administered 2020-07-15: 420 mg via INTRAVENOUS
  Filled 2020-07-15: qty 14

## 2020-07-15 MED ORDER — DIPHENHYDRAMINE HCL 25 MG PO CAPS
ORAL_CAPSULE | ORAL | Status: AC
  Filled 2020-07-15: qty 2

## 2020-07-15 MED ORDER — SODIUM CHLORIDE 0.9 % IV SOLN
150.0000 mg | Freq: Once | INTRAVENOUS | Status: AC
  Administered 2020-07-15: 150 mg via INTRAVENOUS
  Filled 2020-07-15: qty 150

## 2020-07-15 MED ORDER — SODIUM CHLORIDE 0.9% FLUSH
10.0000 mL | INTRAVENOUS | Status: DC | PRN
  Administered 2020-07-15: 10 mL
  Filled 2020-07-15: qty 10

## 2020-07-15 MED ORDER — SODIUM CHLORIDE 0.9 % IV SOLN
Freq: Once | INTRAVENOUS | Status: AC
  Filled 2020-07-15: qty 250

## 2020-07-15 MED ORDER — TRASTUZUMAB-DKST CHEMO 150 MG IV SOLR
6.0000 mg/kg | Freq: Once | INTRAVENOUS | Status: AC
  Administered 2020-07-15: 567 mg via INTRAVENOUS
  Filled 2020-07-15: qty 27

## 2020-07-15 MED ORDER — ACETAMINOPHEN 325 MG PO TABS
ORAL_TABLET | ORAL | Status: AC
  Filled 2020-07-15: qty 2

## 2020-07-15 MED ORDER — SODIUM CHLORIDE 0.9 % IV SOLN
75.0000 mg/m2 | Freq: Once | INTRAVENOUS | Status: AC
  Administered 2020-07-15: 160 mg via INTRAVENOUS
  Filled 2020-07-15: qty 16

## 2020-07-15 MED ORDER — PALONOSETRON HCL INJECTION 0.25 MG/5ML
INTRAVENOUS | Status: AC
  Filled 2020-07-15: qty 5

## 2020-07-15 MED ORDER — SODIUM CHLORIDE 0.9 % IV SOLN
10.0000 mg | Freq: Once | INTRAVENOUS | Status: AC
  Administered 2020-07-15: 10 mg via INTRAVENOUS
  Filled 2020-07-15: qty 10

## 2020-07-15 MED ORDER — DIPHENHYDRAMINE HCL 25 MG PO CAPS
50.0000 mg | ORAL_CAPSULE | Freq: Once | ORAL | Status: AC
  Administered 2020-07-15: 50 mg via ORAL

## 2020-07-15 MED ORDER — SODIUM CHLORIDE 0.9 % IV SOLN
700.0000 mg | Freq: Once | INTRAVENOUS | Status: AC
  Administered 2020-07-15: 700 mg via INTRAVENOUS
  Filled 2020-07-15: qty 70

## 2020-07-15 MED ORDER — ACETAMINOPHEN 325 MG PO TABS
650.0000 mg | ORAL_TABLET | Freq: Once | ORAL | Status: AC
  Administered 2020-07-15: 650 mg via ORAL

## 2020-07-15 MED ORDER — PALONOSETRON HCL INJECTION 0.25 MG/5ML
0.2500 mg | Freq: Once | INTRAVENOUS | Status: AC
  Administered 2020-07-15: 0.25 mg via INTRAVENOUS

## 2020-07-15 MED ORDER — HEPARIN SOD (PORK) LOCK FLUSH 100 UNIT/ML IV SOLN
500.0000 [IU] | Freq: Once | INTRAVENOUS | Status: AC | PRN
  Administered 2020-07-15: 500 [IU]
  Filled 2020-07-15: qty 5

## 2020-07-15 NOTE — Patient Instructions (Signed)
[facility] Discharge Instructions for Patients Receiving Chemotherapy  Today you received the following chemotherapy agents: Trastuzamab, pertuzumab, taxotere, carboplatin  To help prevent nausea and vomiting after your treatment, we encourage you to take your nausea medication as directed   If you develop nausea and vomiting that is not controlled by your nausea medication, call the clinic.   BELOW ARE SYMPTOMS THAT SHOULD BE REPORTED IMMEDIATELY:  *FEVER GREATER THAN 100.5 F  *CHILLS WITH OR WITHOUT FEVER  NAUSEA AND VOMITING THAT IS NOT CONTROLLED WITH YOUR NAUSEA MEDICATION  *UNUSUAL SHORTNESS OF BREATH  *UNUSUAL BRUISING OR BLEEDING  TENDERNESS IN MOUTH AND THROAT WITH OR WITHOUT PRESENCE OF ULCERS  *URINARY PROBLEMS  *BOWEL PROBLEMS  UNUSUAL RASH Items with * indicate a potential emergency and should be followed up as soon as possible.  Feel free to call the clinic should you have any questions or concerns. The clinic phone number is [phone].  Please show the [facility] at check-in to the Emergency Department and triage nurse.

## 2020-07-15 NOTE — Progress Notes (Signed)
Nutrition Assessment   Reason for Assessment:  Patient identified by attending Breast Clinic.    ASSESSMENT:  44 year old female with left breast cancer.  Past medical history of DM.  Patient receiving neoadjuvant chemotherapy.   Met with patient during infusion.  Patient reports good appetite.  Reports that chicken and salmon are tasting good to her.  Reports tomato based foods are not tasting good and increased diarrhea.      Medications: reviewed   Labs: reviewed   Anthropometrics:   Height: 68 inches Weight: 198 lb today UBW: 205 lb on 10/13 BMI: 29  3% weight loss in the last month and half    NUTRITION DIAGNOSIS: Food and nutrition related knowledge deficit related to new diagnosis of breast cancer as evidenced by discussion of nutrient dense foods   INTERVENTION:  Encouraged good sources of protein at every meal.  Provided examples of foods high in protein Encouraged plant based foods (fruits, vegetables, whole grains) Contact information provided   MONITORING, EVALUATION, GOAL: weight trends, intake   Next Visit: Dec 22 during infusion  Robin Kennedy B. Zenia Resides, [place], [place] Registered Dietitian [phone] (mobile)

## 2020-07-17 ENCOUNTER — Other Ambulatory Visit: Payer: Self-pay

## 2020-07-17 ENCOUNTER — Telehealth: Payer: Self-pay | Admitting: Hematology and Oncology

## 2020-07-17 ENCOUNTER — Inpatient Hospital Stay: Payer: 59

## 2020-07-17 VITALS — BP 107/75 | HR 87 | Temp 98.2°F | Resp 16

## 2020-07-17 DIAGNOSIS — C50212 Malignant neoplasm of upper-inner quadrant of left female breast: Secondary | ICD-10-CM | POA: Diagnosis not present

## 2020-07-17 DIAGNOSIS — Z170 Estrogen receptor positive status [ER+]: Secondary | ICD-10-CM

## 2020-07-17 MED ORDER — PEGFILGRASTIM-JMDB 6 MG/0.6ML ~~LOC~~ SOSY
6.0000 mg | PREFILLED_SYRINGE | Freq: Once | SUBCUTANEOUS | Status: AC
  Administered 2020-07-17: 6 mg via SUBCUTANEOUS

## 2020-07-17 MED ORDER — PEGFILGRASTIM-JMDB 6 MG/0.6ML ~~LOC~~ SOSY
PREFILLED_SYRINGE | SUBCUTANEOUS | Status: AC
  Filled 2020-07-17: qty 0.6

## 2020-07-17 NOTE — Telephone Encounter (Signed)
No 12/1 los, no changes made to pt schedule

## 2020-07-17 NOTE — Patient Instructions (Signed)

## 2020-08-04 ENCOUNTER — Encounter: Payer: Self-pay | Admitting: *Deleted

## 2020-08-04 NOTE — Progress Notes (Signed)
Patient Care Team: Lavonia Drafts, MD as PCP - General (Obstetrics and Gynecology) Shawnee Knapp, MD (Family Medicine) Mauro Kaufmann, RN as Oncology Nurse Navigator Rockwell Germany, RN as Oncology Nurse Navigator [name], MD as Consulting Physician (General Surgery) Nicholas Lose, MD as Consulting Physician (Hematology and Oncology) Eppie Gibson, MD as Attending Physician (Radiation Oncology)  DIAGNOSIS:    ICD-10-CM   1. Malignant neoplasm of upper-inner quadrant of left breast in female, estrogen receptor positive ([place])  C50.212    Z17.0     SUMMARY OF ONCOLOGIC HISTORY: Oncology History  Malignant neoplasm of upper-inner quadrant of left breast in female, estrogen receptor positive ([place])  06/05/2020 Initial Diagnosis   06/05/2020: Enlarging left breast with pain and diffuse skin thickening: Mammogram revealed 3.5 cm mass 11 o'clock position with 3 abnormal lymph nodes: 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 Right breast benign-appearing calcifications previous stereotactic biopsy was attempted but it was too superficial and felt to be benign.   06/25/2020 -  Chemotherapy   The patient had palonosetron (ALOXI) injection 0.25 mg, 0.25 mg, Intravenous,  Once, 2 of 6 cycles Administration: 0.25 mg (06/25/2020), 0.25 mg (07/15/2020) pegfilgrastim-jmdb (FULPHILA) injection 6 mg, 6 mg, Subcutaneous,  Once, 2 of 6 cycles Administration: 6 mg (06/27/2020), 6 mg (07/17/2020) CARBOplatin (PARAPLATIN) 700 mg in sodium chloride 0.9 % 250 mL chemo infusion, 700 mg (100 % of original dose 700 mg), Intravenous,  Once, 2 of 6 cycles Dose modification: 700 mg (original dose 700 mg, Cycle 1) Administration: 700 mg (06/25/2020), 700 mg (07/15/2020) DOCEtaxel (TAXOTERE) 160 mg in sodium chloride 0.9 % 250 mL chemo infusion, 75 mg/m2 = 160 mg, Intravenous,  Once, 2 of 6 cycles Administration: 160 mg (06/25/2020), 160 mg  (07/15/2020) fosaprepitant (EMEND) 150 mg in sodium chloride 0.9 % 145 mL IVPB, 150 mg, Intravenous,  Once, 2 of 6 cycles Administration: 150 mg (06/25/2020), 150 mg (07/15/2020) pertuzumab (PERJETA) 420 mg in sodium chloride 0.9 % 250 mL chemo infusion, 420 mg (100 % of original dose 420 mg), Intravenous, Once, 2 of 6 cycles Dose modification: 420 mg (original dose 420 mg, Cycle 1, Reason: Provider Judgment) Administration: 420 mg (06/25/2020), 420 mg (07/15/2020) trastuzumab-dkst (OGIVRI) 750 mg in sodium chloride 0.9 % 250 mL chemo infusion, 735 mg, Intravenous,  Once, 2 of 6 cycles Administration: 750 mg (06/25/2020), 567 mg (07/15/2020)  for chemotherapy treatment.    06/29/2020 Genetic Testing   Negative genetic testing: no pathogenic variants detected in [facility] Common Hereditary Cancers Panel.  The report date is June 29, 2020.   The Common Hereditary Cancers Panel offered by [facility] includes sequencing and/or deletion duplication testing of the following 48 genes: APC, ATM, AXIN2, BARD1, BMPR1A, BRCA1, BRCA2, BRIP1, CDH1, CDK4, CDKN2A (p14ARF), CDKN2A (p16INK4a), CHEK2, CTNNA1, DICER1, EPCAM (Deletion/duplication testing only), GREM1 (promoter region deletion/duplication testing only), KIT, MEN1, MLH1, MSH2, MSH3, MSH6, MUTYH, NBN, NF1, NHTL1, PALB2, PDGFRA, PMS2, POLD1, POLE, PTEN, RAD50, RAD51C, RAD51D, RNF43, SDHB, SDHC, SDHD, SMAD4, SMARCA4. STK11, TP53, TSC1, TSC2, and VHL.  The following genes were evaluated for sequence changes only: SDHA and HOXB13 c.251G>A variant only.     CHIEF COMPLIANT: Cycle 3TCH Perjeta  INTERVAL HISTORY: Robin Kennedy is a 44 y.o. with above-mentioned history of inflammatory left breast cancercurrently on neoadjuvant chemotherapy with TCH Perjeta.She presents to the clinic todayfora toxicity check andcycle 3. She continues to have diarrhea up to 3 times a day.  She has  not been taking Imodium because it has not been bothering her too much.   However she is low on potassium today.  She has had some gum sensitivity and slight headaches on the left side of the scalp.  Denies any nausea or vomiting.  Denies neuropathy.  ALLERGIES:  has No Known Allergies.  MEDICATIONS:  Current Outpatient Medications  Medication Sig Dispense Refill  . glimepiride (AMARYL) 4 MG tablet Take 4 mg by mouth 2 (two) times daily.  6  . glucose blood (ONE TOUCH ULTRA TEST) test strip Use to check cbgs qd 100 each 11  . ibuprofen (ADVIL,MOTRIN) 600 MG tablet Take 1 tablet (600 mg total) by mouth every 6 (six) hours as needed (mild pain). (Patient not taking: Reported on 05/27/2020) 30 tablet 0  . lidocaine-prilocaine (EMLA) cream Apply to affected area once 30 g 3  . LORazepam (ATIVAN) 0.5 MG tablet Take 1 tablet (0.5 mg total) by mouth at bedtime as needed for sleep. 30 tablet 1  . metFORMIN (GLUCOPHAGE-XR) 500 MG 24 hr tablet Take 1,000 mg by mouth 2 (two) times daily.  3  . ondansetron (ZOFRAN) 8 MG tablet Take 1 tablet (8 mg total) by mouth 2 (two) times daily as needed (Nausea or vomiting). Start on the third day after chemotherapy. 30 tablet 1  . oxyCODONE (OXY IR/ROXICODONE) 5 MG immediate release tablet Take 1 tablet (5 mg total) by mouth every 6 (six) hours as needed for severe pain. 10 tablet 0  . prochlorperazine (COMPAZINE) 10 MG tablet Take 1 tablet (10 mg total) by mouth every 6 (six) hours as needed (Nausea or vomiting). 30 tablet 1   No current facility-administered medications for this visit.    PHYSICAL EXAMINATION: ECOG PERFORMANCE STATUS: 1 - Symptomatic but completely ambulatory  Vitals:   08/05/20 0927  BP: 122/72  Pulse: 83  Resp: 18  Temp: (!) 97.4 F (36.3 C)  SpO2: 100%   Filed Weights   08/05/20 0927  Weight: 193 lb 11.2 oz (87.9 kg)    LABORATORY DATA:  I have reviewed the data as listed CMP Latest Ref Rng & Units 08/05/2020 07/15/2020 07/02/2020  Glucose 70 - 99 mg/dL 104(H) 114(H) 254(H)  BUN 6 - 20 mg/dL '8 6 8   ' Creatinine 0.44 - 1.00 mg/dL 0.74 0.70 0.93  Sodium 135 - 145 mmol/L 145 141 135  Potassium 3.5 - 5.1 mmol/L 2.9(L) 3.5 4.1  Chloride 98 - 111 mmol/L 107 110 102  CO2 22 - 32 mmol/L '27 24 23  ' Calcium 8.9 - 10.3 mg/dL 9.1 9.1 9.4  Total Protein 6.5 - 8.1 g/dL 6.4(L) 6.7 7.6  Total Bilirubin 0.3 - 1.2 mg/dL 0.6 0.5 0.4  Alkaline Phos 38 - 126 U/L 54 53 87  AST 15 - 41 U/L 10(L) 9(L) 17  ALT 0 - 44 U/L '13 8 13    ' Lab Results  Component Value Date   WBC 6.6 08/05/2020   HGB 9.3 (L) 08/05/2020   HCT 29.2 (L) 08/05/2020   MCV 86.4 08/05/2020   PLT 208 08/05/2020   NEUTROABS 4.0 08/05/2020    ASSESSMENT & PLAN:  Malignant neoplasm of upper-inner quadrant of left breast in female, estrogen receptor positive ([place]) 06/05/2020: Enlarging left breast with pain and diffuse skin thickening: Mammogram revealed 3.5 cm mass 11 o'clock position with 3 abnormal lymph nodes: 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 Right breast benign-appearing calcifications previous stereotactic biopsy was attempted  but it was too superficial and felt to be benign.  Recommendationbased on multidisciplinary tumor board: 1. Neoadjuvant chemotherapy with TCH Perjeta 6 cycles followed by Herceptin Perjeta versus Kadcyla maintenance for 1 year 2. Followed by breast conserving surgery if possible with sentinel lymph node study 3. Followed by adjuvant radiation therapy if patient had lumpectomy ------------------------------------------------------------------------------------------------------------------------------------------ Breast MRI 06/15/2020: Inflammatory left breast cancer changes, multicentric contiguous masses 10 cm max, 9 satellite nodules, 9 axillary lymph nodes, enlarged anterior mediastinal lymph node, indeterminate 1.1 cm mass right breast posterior depth  CT CAP 06/16/2020: Extensive cutaneous thickening left breast, metastatic adenopathy left axilla, left  pectoralis, left supraclavicular. No other distant metastatic disease.  Bone Scan 06/17/20:Solitary small but suspicious focus of increased radiotracer activity in the proximal shaft of the right humerus. Right shoulder/humerus MRIwill be ordered. ------------------------------------------------------------------------------------------------------------------------------------------------------- Current Treatment:Neoadjuvant chemotherapy with [place] Perjetatoday is cycle 3 Echo 06/15/2020: EF 60 to 65% Labs reviewed  Chemotoxicities: 1.Diarrhea that lasted for 3 days: Instructed to take Imodium once again. 2.fatigue related to chemo 3.  Chemo induced anemia: Today's hemoglobin is 9.3.  Monitoring 4.  Hypokalemia: I sent a prescription for potassium.  Blood work has been reviewed.  The chemo dosing will remain the same.  Diabetes: Her primary care physician started her on insulin.  This will hopefully control her blood sugars.  Monitoring closely for chemo toxicities. Return to clinic in3 weeks for cycle 4    No orders of the defined types were placed in this encounter.  The patient has a good understanding of the overall plan. she agrees with it. she will call with any problems that may develop before the next visit here.  Total time spent: 30 mins including face to face time and time spent for planning, charting and coordination of care  Nicholas Lose, MD 08/05/2020  I, Cloyde Reams Dorshimer, am acting as scribe for Dr. Nicholas Lose.  I have reviewed the above documentation for accuracy and completeness, and I agree with the above.

## 2020-08-05 ENCOUNTER — Inpatient Hospital Stay: Payer: 59

## 2020-08-05 ENCOUNTER — Inpatient Hospital Stay (HOSPITAL_BASED_OUTPATIENT_CLINIC_OR_DEPARTMENT_OTHER): Payer: 59 | Admitting: Hematology and Oncology

## 2020-08-05 ENCOUNTER — Encounter: Payer: Self-pay | Admitting: *Deleted

## 2020-08-05 ENCOUNTER — Other Ambulatory Visit: Payer: Self-pay

## 2020-08-05 VITALS — BP 101/68 | HR 84 | Temp 98.9°F | Resp 18

## 2020-08-05 DIAGNOSIS — Z95828 Presence of other vascular implants and grafts: Secondary | ICD-10-CM

## 2020-08-05 DIAGNOSIS — Z170 Estrogen receptor positive status [ER+]: Secondary | ICD-10-CM

## 2020-08-05 DIAGNOSIS — C50212 Malignant neoplasm of upper-inner quadrant of left female breast: Secondary | ICD-10-CM

## 2020-08-05 LAB — CMP (CANCER CENTER ONLY)
ALT: 13 U/L (ref 0–44)
AST: 10 U/L — ABNORMAL LOW (ref 15–41)
Albumin: 3.5 g/dL (ref 3.5–5.0)
Alkaline Phosphatase: 54 U/L (ref 38–126)
Anion gap: 11 (ref 5–15)
BUN: 8 mg/dL (ref 6–20)
CO2: 27 mmol/L (ref 22–32)
Calcium: 9.1 mg/dL (ref 8.9–10.3)
Chloride: 107 mmol/L (ref 98–111)
Creatinine: 0.74 mg/dL (ref 0.44–1.00)
GFR, Estimated: >60 mL/min (ref >60)
Glucose, Bld: 104 mg/dL — ABNORMAL HIGH (ref 70–99)
Potassium: 2.9 mmol/L — ABNORMAL LOW (ref 3.5–5.1)
Sodium: 145 mmol/L (ref 135–145)
Total Bilirubin: 0.6 mg/dL (ref 0.3–1.2)
Total Protein: 6.4 g/dL — ABNORMAL LOW (ref 6.5–8.1)

## 2020-08-05 LAB — CBC WITH DIFFERENTIAL (CANCER CENTER ONLY)
Abs Immature Granulocytes: 0.02 10*3/uL (ref 0.00–0.07)
Basophils Absolute: 0.1 10*3/uL (ref 0.0–0.1)
Basophils Relative: 1 %
Eosinophils Absolute: 0 10*3/uL (ref 0.0–0.5)
Eosinophils Relative: 0 %
HCT: 29.2 % — ABNORMAL LOW (ref 36.0–46.0)
Hemoglobin: 9.3 g/dL — ABNORMAL LOW (ref 12.0–15.0)
Immature Granulocytes: 0 %
Lymphocytes Relative: 30 %
Lymphs Abs: 2 10*3/uL (ref 0.7–4.0)
MCH: 27.5 pg (ref 26.0–34.0)
MCHC: 31.8 g/dL (ref 30.0–36.0)
MCV: 86.4 fL (ref 80.0–100.0)
Monocytes Absolute: 0.5 10*3/uL (ref 0.1–1.0)
Monocytes Relative: 8 %
Neutro Abs: 4 10*3/uL (ref 1.7–7.7)
Neutrophils Relative %: 61 %
Platelet Count: 208 10*3/uL (ref 150–400)
RBC: 3.38 MIL/uL — ABNORMAL LOW (ref 3.87–5.11)
RDW: 14.6 % (ref 11.5–15.5)
WBC Count: 6.6 10*3/uL (ref 4.0–10.5)
nRBC: 0 % (ref 0.0–0.2)

## 2020-08-05 MED ORDER — SODIUM CHLORIDE 0.9 % IV SOLN
150.0000 mg | Freq: Once | INTRAVENOUS | Status: AC
  Administered 2020-08-05: 150 mg via INTRAVENOUS
  Filled 2020-08-05: qty 150

## 2020-08-05 MED ORDER — ACETAMINOPHEN 325 MG PO TABS
650.0000 mg | ORAL_TABLET | Freq: Once | ORAL | Status: AC
  Administered 2020-08-05: 650 mg via ORAL

## 2020-08-05 MED ORDER — DIPHENHYDRAMINE HCL 25 MG PO CAPS
50.0000 mg | ORAL_CAPSULE | Freq: Once | ORAL | Status: AC
  Administered 2020-08-05: 50 mg via ORAL

## 2020-08-05 MED ORDER — SODIUM CHLORIDE 0.9 % IV SOLN
420.0000 mg | Freq: Once | INTRAVENOUS | Status: AC
  Administered 2020-08-05: 420 mg via INTRAVENOUS
  Filled 2020-08-05: qty 14

## 2020-08-05 MED ORDER — SODIUM CHLORIDE 0.9 % IV SOLN
700.0000 mg | Freq: Once | INTRAVENOUS | Status: AC
  Administered 2020-08-05: 700 mg via INTRAVENOUS
  Filled 2020-08-05: qty 70

## 2020-08-05 MED ORDER — ACETAMINOPHEN 325 MG PO TABS
ORAL_TABLET | ORAL | Status: AC
  Filled 2020-08-05: qty 2

## 2020-08-05 MED ORDER — SODIUM CHLORIDE 0.9 % IV SOLN
Freq: Once | INTRAVENOUS | Status: AC
  Filled 2020-08-05: qty 250

## 2020-08-05 MED ORDER — SODIUM CHLORIDE 0.9% FLUSH
10.0000 mL | INTRAVENOUS | Status: DC | PRN
  Administered 2020-08-05: 10 mL
  Filled 2020-08-05: qty 10

## 2020-08-05 MED ORDER — DIPHENHYDRAMINE HCL 25 MG PO CAPS
ORAL_CAPSULE | ORAL | Status: AC
  Filled 2020-08-05: qty 2

## 2020-08-05 MED ORDER — PALONOSETRON HCL INJECTION 0.25 MG/5ML
0.2500 mg | Freq: Once | INTRAVENOUS | Status: AC
  Administered 2020-08-05: 0.25 mg via INTRAVENOUS

## 2020-08-05 MED ORDER — POTASSIUM CHLORIDE CRYS ER 20 MEQ PO TBCR: 20.0000 meq | EXTENDED_RELEASE_TABLET | Freq: Every day | ORAL | 0 refills | Status: DC

## 2020-08-05 MED ORDER — SODIUM CHLORIDE 0.9 % IV SOLN
10.0000 mg | Freq: Once | INTRAVENOUS | Status: AC
  Administered 2020-08-05: 10 mg via INTRAVENOUS
  Filled 2020-08-05: qty 10

## 2020-08-05 MED ORDER — SODIUM CHLORIDE 0.9 % IV SOLN
75.0000 mg/m2 | Freq: Once | INTRAVENOUS | Status: AC
  Administered 2020-08-05: 160 mg via INTRAVENOUS
  Filled 2020-08-05: qty 16

## 2020-08-05 MED ORDER — HEPARIN SOD (PORK) LOCK FLUSH 100 UNIT/ML IV SOLN
500.0000 [IU] | Freq: Once | INTRAVENOUS | Status: AC | PRN
  Administered 2020-08-05: 500 [IU]
  Filled 2020-08-05: qty 5

## 2020-08-05 MED ORDER — SODIUM CHLORIDE 0.9% FLUSH
10.0000 mL | Freq: Once | INTRAVENOUS | Status: AC
  Administered 2020-08-05: 10 mL
  Filled 2020-08-05: qty 10

## 2020-08-05 MED ORDER — TRASTUZUMAB-DKST CHEMO 150 MG IV SOLR
6.0000 mg/kg | Freq: Once | INTRAVENOUS | Status: AC
  Administered 2020-08-05: 567 mg via INTRAVENOUS
  Filled 2020-08-05: qty 27

## 2020-08-05 MED ORDER — PALONOSETRON HCL INJECTION 0.25 MG/5ML
INTRAVENOUS | Status: AC
  Filled 2020-08-05: qty 5

## 2020-08-05 NOTE — Assessment & Plan Note (Signed)
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 Right breast benign-appearing calcifications previous stereotactic biopsy was attempted but it was too superficial and felt to be benign.  Recommendationbased on multidisciplinary tumor board: 1. Neoadjuvant chemotherapy with TCH Perjeta 6 cycles followed by Herceptin Perjeta versus Kadcyla maintenance for 1 year 2. Followed by breast conserving surgery if possible with sentinel lymph node study 3. Followed by adjuvant radiation therapy if patient had lumpectomy ------------------------------------------------------------------------------------------------------------------------------------------ Breast MRI 06/15/2020: Inflammatory left breast cancer changes, multicentric contiguous masses 10 cm max, 9 satellite nodules, 9 axillary lymph nodes, enlarged anterior mediastinal lymph node, indeterminate 1.1 cm mass right breast posterior depth  CT CAP 06/16/2020: Extensive cutaneous thickening left breast, metastatic adenopathy left axilla, left pectoralis, left supraclavicular. No other distant metastatic disease.  Bone Scan 06/17/20:Solitary small but suspicious focus of increased radiotracer activity in the proximal shaft of the right humerus. Right shoulder/humerus MRIwill be ordered. ------------------------------------------------------------------------------------------------------------------------------------------------------- Current Treatment:Neoadjuvant chemotherapy with [place] Perjetatoday is cycle 3 Echo 06/15/2020: EF 60 to 65% Labs reviewed  Chemotoxicities: 1.Diarrhea that lasted for 2 days. She took Imodium and it helped. 2.fatigue related to chemo 3.  Chemo induced anemia: Today's hemoglobin is 7.4.  Monitoring  Blood work has been  reviewed.  The chemo dosing will remain the same.  Diabetes: Her primary care physician started her on insulin.  This will hopefully control her blood sugars.  Monitoring closely for chemo toxicities. Return to clinic in3 weeks for cycle 4

## 2020-08-05 NOTE — Patient Instructions (Signed)
[facility] Discharge Instructions for Patients Receiving Chemotherapy  Today you received the following chemotherapy agents: Trastuzamab, pertuzumab, taxotere, carboplatin  To help prevent nausea and vomiting after your treatment, we encourage you to take your nausea medication as directed   If you develop nausea and vomiting that is not controlled by your nausea medication, call the clinic.   BELOW ARE SYMPTOMS THAT SHOULD BE REPORTED IMMEDIATELY:  *FEVER GREATER THAN 100.5 F  *CHILLS WITH OR WITHOUT FEVER  NAUSEA AND VOMITING THAT IS NOT CONTROLLED WITH YOUR NAUSEA MEDICATION  *UNUSUAL SHORTNESS OF BREATH  *UNUSUAL BRUISING OR BLEEDING  TENDERNESS IN MOUTH AND THROAT WITH OR WITHOUT PRESENCE OF ULCERS  *URINARY PROBLEMS  *BOWEL PROBLEMS  UNUSUAL RASH Items with * indicate a potential emergency and should be followed up as soon as possible.  Feel free to call the clinic should you have any questions or concerns. The clinic phone number is [phone].  Please show the CHEMO ALERT CARD at check-in to the Emergency Department and triage nurse.   

## 2020-08-05 NOTE — Progress Notes (Signed)
Nutrition Follow-up:  Patient with left breast cancer.  Patient receiving neoadjuvant chemotherapy.   Met with patient during infusion.  Patient reports good appetite. Having issues with diarrhea.  MD recommended taking imodium and K supplement.      Medications: reviewed  Labs: K 2.9  Anthropometrics:   Weight 193 lb 11.2 oz on 12/22 decreased from 198 lb on 12/1  NUTRITION DIAGNOSIS: Food and nutrition related knowledge deficit improving   INTERVENTION:  Reviewed foods high in protein Discussed oral nutrition supplements high in protein (premier protein, ensure max protein, boost max protein).  Discussed foods high in potassium    MONITORING, EVALUATION, GOAL: weight trends, intake   NEXT VISIT: Feb 2 during infusion  Robin Kennedy, [place], [place] Registered Dietitian [phone] (mobile)

## 2020-08-07 ENCOUNTER — Inpatient Hospital Stay: Payer: 59

## 2020-08-07 ENCOUNTER — Other Ambulatory Visit: Payer: Self-pay

## 2020-08-07 VITALS — BP 109/63 | HR 84 | Resp 18

## 2020-08-07 DIAGNOSIS — C50212 Malignant neoplasm of upper-inner quadrant of left female breast: Secondary | ICD-10-CM | POA: Diagnosis not present

## 2020-08-07 DIAGNOSIS — Z170 Estrogen receptor positive status [ER+]: Secondary | ICD-10-CM

## 2020-08-07 MED ORDER — PEGFILGRASTIM-JMDB 6 MG/0.6ML ~~LOC~~ SOSY
6.0000 mg | PREFILLED_SYRINGE | Freq: Once | SUBCUTANEOUS | Status: AC
  Administered 2020-08-07: 6 mg via SUBCUTANEOUS

## 2020-08-07 NOTE — Patient Instructions (Signed)

## 2020-08-10 ENCOUNTER — Encounter: Payer: Self-pay | Admitting: *Deleted

## 2020-08-25 NOTE — Progress Notes (Signed)
Patient Care Team: Lavonia Drafts, MD as PCP - General (Obstetrics and Gynecology) Shawnee Knapp, MD (Family Medicine) Mauro Kaufmann, RN as Oncology Nurse Navigator Rockwell Germany, RN as Oncology Nurse Navigator [name], MD as Consulting Physician (General Surgery) Nicholas Lose, MD as Consulting Physician (Hematology and Oncology) Eppie Gibson, MD as Attending Physician (Radiation Oncology)  DIAGNOSIS:    ICD-10-CM   1. 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 Right breast benign-appearing calcifications previous stereotactic biopsy was attempted but it was too superficial and felt to be benign.   06/25/2020 -  Chemotherapy   The patient had palonosetron (ALOXI) injection 0.25 mg, 0.25 mg, Intravenous,  Once, 3 of 6 cycles Administration: 0.25 mg (06/25/2020), 0.25 mg (07/15/2020), 0.25 mg (08/05/2020) pegfilgrastim-jmdb (FULPHILA) injection 6 mg, 6 mg, Subcutaneous,  Once, 3 of 6 cycles Administration: 6 mg (06/27/2020), 6 mg (07/17/2020), 6 mg (08/07/2020) CARBOplatin (PARAPLATIN) 700 mg in sodium chloride 0.9 % 250 mL chemo infusion, 700 mg (100 % of original dose 700 mg), Intravenous,  Once, 3 of 6 cycles Dose modification: 700 mg (original dose 700 mg, Cycle 1) Administration: 700 mg (06/25/2020), 700 mg (07/15/2020), 700 mg (08/05/2020) DOCEtaxel (TAXOTERE) 160 mg in sodium chloride 0.9 % 250 mL chemo infusion, 75 mg/m2 = 160 mg, Intravenous,  Once, 3 of 6  cycles Administration: 160 mg (06/25/2020), 160 mg (07/15/2020), 160 mg (08/05/2020) fosaprepitant (EMEND) 150 mg in sodium chloride 0.9 % 145 mL IVPB, 150 mg, Intravenous,  Once, 3 of 6 cycles Administration: 150 mg (06/25/2020), 150 mg (07/15/2020), 150 mg (08/05/2020) pertuzumab (PERJETA) 420 mg in sodium chloride 0.9 % 250 mL chemo infusion, 420 mg (100 % of original dose 420 mg), Intravenous, Once, 3 of 6 cycles Dose modification: 420 mg (original dose 420 mg, Cycle 1, Reason: Provider Judgment) Administration: 420 mg (06/25/2020), 420 mg (07/15/2020), 420 mg (08/05/2020) trastuzumab-dkst (OGIVRI) 750 mg in sodium chloride 0.9 % 250 mL chemo infusion, 735 mg, Intravenous,  Once, 3 of 6 cycles Administration: 750 mg (06/25/2020), 567 mg (07/15/2020), 567 mg (08/05/2020)  for chemotherapy treatment.    06/29/2020 Genetic Testing   Negative genetic testing: no pathogenic variants detected in [facility] Common Hereditary Cancers Panel.  The report date is June 29, 2020.   The Common Hereditary Cancers Panel offered by [facility] includes sequencing and/or deletion duplication testing of the following 48 genes: APC, ATM, AXIN2, BARD1, BMPR1A, BRCA1, BRCA2, BRIP1, CDH1, CDK4, CDKN2A (p14ARF), CDKN2A (p16INK4a), CHEK2, CTNNA1, DICER1, EPCAM (Deletion/duplication testing only), GREM1 (promoter region deletion/duplication testing only), KIT, MEN1, MLH1, MSH2, MSH3, MSH6, MUTYH, NBN, NF1, NHTL1, PALB2, PDGFRA, PMS2, POLD1, POLE, PTEN, RAD50, RAD51C, RAD51D, RNF43, SDHB, SDHC, SDHD, SMAD4, SMARCA4. STK11, TP53, TSC1, TSC2, and VHL.  The following genes were evaluated for sequence changes only: SDHA and HOXB13 c.251G>A variant only.     CHIEF COMPLIANT: Cycle4TCH Perjeta  INTERVAL HISTORY: Robin Kennedy is a 45 y.o. with above-mentioned history of inflammatory left breast cancercurrently on neoadjuvant chemotherapy with TCH Perjeta.She presents to the  clinic todayfora toxicity  checkandcycle4. She tolerated the last chemo extremely well without any major side effects.  She is taking Imodium as preventative and this is helped her stop the diarrhea.  Denies any nausea or vomiting.  She has excellent energy levels.  ALLERGIES:  has No Known Allergies.  MEDICATIONS:  Current Outpatient Medications  Medication Sig Dispense Refill  . glimepiride (AMARYL) 4 MG tablet Take 4 mg by mouth 2 (two) times daily.  6  . glucose blood (ONE TOUCH ULTRA TEST) test strip Use to check cbgs qd 100 each 11  . ibuprofen (ADVIL,MOTRIN) 600 MG tablet Take 1 tablet (600 mg total) by mouth every 6 (six) hours as needed (mild pain). (Patient not taking: Reported on 05/27/2020) 30 tablet 0  . lidocaine-prilocaine (EMLA) cream Apply to affected area once 30 g 3  . LORazepam (ATIVAN) 0.5 MG tablet Take 1 tablet (0.5 mg total) by mouth at bedtime as needed for sleep. 30 tablet 1  . metFORMIN (GLUCOPHAGE-XR) 500 MG 24 hr tablet Take 1,000 mg by mouth 2 (two) times daily.  3  . ondansetron (ZOFRAN) 8 MG tablet Take 1 tablet (8 mg total) by mouth 2 (two) times daily as needed (Nausea or vomiting). Start on the third day after chemotherapy. 30 tablet 1  . oxyCODONE (OXY IR/ROXICODONE) 5 MG immediate release tablet Take 1 tablet (5 mg total) by mouth every 6 (six) hours as needed for severe pain. 10 tablet 0  . potassium chloride SA (KLOR-CON) 20 MEQ tablet Take 1 tablet (20 mEq total) by mouth daily. 30 tablet 0  . prochlorperazine (COMPAZINE) 10 MG tablet Take 1 tablet (10 mg total) by mouth every 6 (six) hours as needed (Nausea or vomiting). 30 tablet 1   No current facility-administered medications for this visit.    PHYSICAL EXAMINATION: ECOG PERFORMANCE STATUS: 1 - Symptomatic but completely ambulatory  There were no vitals filed for this visit. There were no vitals filed for this visit.  LABORATORY DATA:  I have reviewed the data as listed CMP Latest Ref Rng & Units 08/05/2020 07/15/2020  07/02/2020  Glucose 70 - 99 mg/dL 104(H) 114(H) 254(H)  BUN 6 - 20 mg/dL '8 6 8  ' Creatinine 0.44 - 1.00 mg/dL 0.74 0.70 0.93  Sodium 135 - 145 mmol/L 145 141 135  Potassium 3.5 - 5.1 mmol/L 2.9(L) 3.5 4.1  Chloride 98 - 111 mmol/L 107 110 102  CO2 22 - 32 mmol/L '27 24 23  ' Calcium 8.9 - 10.3 mg/dL 9.1 9.1 9.4  Total Protein 6.5 - 8.1 g/dL 6.4(L) 6.7 7.6  Total Bilirubin 0.3 - 1.2 mg/dL 0.6 0.5 0.4  Alkaline Phos 38 - 126 U/L 54 53 87  AST 15 - 41 U/L 10(L) 9(L) 17  ALT 0 - 44 U/L '13 8 13    ' Lab Results  Component Value Date   WBC 6.6 08/05/2020   HGB 9.3 (L) 08/05/2020   HCT 29.2 (L) 08/05/2020   MCV 86.4 08/05/2020   PLT 208 08/05/2020   NEUTROABS 4.0 08/05/2020    ASSESSMENT & PLAN:  Malignant neoplasm of upper-inner quadrant of left breast in female, estrogen receptor positive ([place]) 06/05/2020: Enlarging left breast with pain and diffuse skin thickening: Mammogram revealed 3.5 cm mass 11 o'clock position with 3 abnormal lymph nodes: 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 Right breast benign-appearing calcifications previous stereotactic biopsy was attempted but it was too superficial and felt to be  benign.  Recommendationbased on multidisciplinary tumor board: 1. Neoadjuvant chemotherapy with TCH Perjeta 6 cycles followed by Herceptin Perjeta versus Kadcyla maintenance for 1 year 2. Followed by breast conserving surgery if possible with sentinel lymph node study 3. Followed by adjuvant radiation therapy if patient had lumpectomy ------------------------------------------------------------------------------------------------------------------------------------------ Breast MRI 06/15/2020: Inflammatory left breast cancer changes, multicentric contiguous masses 10 cm max, 9 satellite nodules, 9 axillary lymph nodes, enlarged anterior mediastinal lymph node, indeterminate 1.1 cm mass right breast posterior depth  CT CAP 06/16/2020:  Extensive cutaneous thickening left breast, metastatic adenopathy left axilla, left pectoralis, left supraclavicular. No other distant metastatic disease.  Bone Scan 06/17/20:Solitary small but suspicious focus of increased radiotracer activity in the proximal shaft of the right humerus. Right shoulder/humerus MRIwill be ordered. ------------------------------------------------------------------------------------------------------------------------------------------------------- Current Treatment:Neoadjuvant chemotherapy with [facility] Perjetatodayiscycle4 Echo 06/15/2020: EF 60 to 65% Labs reviewed  Chemotoxicities: 1.Diarrhea: Imodium is being taken as preventatively and it is helped her a lot. 2.fatigue related to chemo 3.Chemo induced anemia: Today's hemoglobin is 9.4. Monitoring 4.  Hypokalemia: Currently on potassium.  Blood work has been reviewed.  The chemo dosing will remain the same.  Diabetes: Monitoring her blood sugars.  Monitoring closely for chemo toxicities. Return to clinic in3weeks for cycle 4     No orders of the defined types were placed in this encounter.  The patient has a good understanding of the overall plan. she agrees with it. she will call with any problems that may develop before the next visit here.  Total time spent: 30 mins including face to face time and time spent for planning, charting and coordination of care  Nicholas Lose, MD 08/26/2020  I, Cloyde Reams Dorshimer, am acting as scribe for Dr. Nicholas Lose.  I have reviewed the above documentation for accuracy and completeness, and I agree with the above.

## 2020-08-26 ENCOUNTER — Inpatient Hospital Stay (HOSPITAL_BASED_OUTPATIENT_CLINIC_OR_DEPARTMENT_OTHER): Payer: 59 | Admitting: Hematology and Oncology

## 2020-08-26 ENCOUNTER — Inpatient Hospital Stay: Payer: 59 | Attending: Hematology and Oncology

## 2020-08-26 ENCOUNTER — Inpatient Hospital Stay: Payer: 59

## 2020-08-26 ENCOUNTER — Other Ambulatory Visit: Payer: Self-pay

## 2020-08-26 DIAGNOSIS — Z170 Estrogen receptor positive status [ER+]: Secondary | ICD-10-CM | POA: Diagnosis not present

## 2020-08-26 DIAGNOSIS — Z5111 Encounter for antineoplastic chemotherapy: Secondary | ICD-10-CM | POA: Diagnosis not present

## 2020-08-26 DIAGNOSIS — Z95828 Presence of other vascular implants and grafts: Secondary | ICD-10-CM

## 2020-08-26 DIAGNOSIS — T451X5A Adverse effect of antineoplastic and immunosuppressive drugs, initial encounter: Secondary | ICD-10-CM | POA: Insufficient documentation

## 2020-08-26 DIAGNOSIS — Z79899 Other long term (current) drug therapy: Secondary | ICD-10-CM | POA: Diagnosis not present

## 2020-08-26 DIAGNOSIS — D6481 Anemia due to antineoplastic chemotherapy: Secondary | ICD-10-CM | POA: Insufficient documentation

## 2020-08-26 DIAGNOSIS — E876 Hypokalemia: Secondary | ICD-10-CM | POA: Diagnosis not present

## 2020-08-26 DIAGNOSIS — C50212 Malignant neoplasm of upper-inner quadrant of left female breast: Secondary | ICD-10-CM | POA: Diagnosis not present

## 2020-08-26 LAB — CBC WITH DIFFERENTIAL (CANCER CENTER ONLY)
Abs Immature Granulocytes: 0.01 10*3/uL (ref 0.00–0.07)
Basophils Absolute: 0.1 10*3/uL (ref 0.0–0.1)
Basophils Relative: 1 %
Eosinophils Absolute: 0 10*3/uL (ref 0.0–0.5)
Eosinophils Relative: 0 %
HCT: 29.5 % — ABNORMAL LOW (ref 36.0–46.0)
Hemoglobin: 9.4 g/dL — ABNORMAL LOW (ref 12.0–15.0)
Immature Granulocytes: 0 %
Lymphocytes Relative: 34 %
Lymphs Abs: 2 10*3/uL (ref 0.7–4.0)
MCH: 27.5 pg (ref 26.0–34.0)
MCHC: 31.9 g/dL (ref 30.0–36.0)
MCV: 86.3 fL (ref 80.0–100.0)
Monocytes Absolute: 0.5 10*3/uL (ref 0.1–1.0)
Monocytes Relative: 9 %
Neutro Abs: 3.3 10*3/uL (ref 1.7–7.7)
Neutrophils Relative %: 56 %
Platelet Count: 215 10*3/uL (ref 150–400)
RBC: 3.42 MIL/uL — ABNORMAL LOW (ref 3.87–5.11)
RDW: 16 % — ABNORMAL HIGH (ref 11.5–15.5)
WBC Count: 5.9 10*3/uL (ref 4.0–10.5)
nRBC: 0 % (ref 0.0–0.2)

## 2020-08-26 LAB — CMP (CANCER CENTER ONLY)
ALT: 9 U/L (ref 0–44)
AST: 10 U/L — ABNORMAL LOW (ref 15–41)
Albumin: 3.6 g/dL (ref 3.5–5.0)
Alkaline Phosphatase: 57 U/L (ref 38–126)
Anion gap: 10 (ref 5–15)
BUN: 12 mg/dL (ref 6–20)
CO2: 25 mmol/L (ref 22–32)
Calcium: 9.3 mg/dL (ref 8.9–10.3)
Chloride: 107 mmol/L (ref 98–111)
Creatinine: 0.74 mg/dL (ref 0.44–1.00)
GFR, Estimated: >60 mL/min (ref >60)
Glucose, Bld: 134 mg/dL — ABNORMAL HIGH (ref 70–99)
Potassium: 3.5 mmol/L (ref 3.5–5.1)
Sodium: 142 mmol/L (ref 135–145)
Total Bilirubin: 0.6 mg/dL (ref 0.3–1.2)
Total Protein: 6.7 g/dL (ref 6.5–8.1)

## 2020-08-26 MED ORDER — ACETAMINOPHEN 325 MG PO TABS
650.0000 mg | ORAL_TABLET | Freq: Once | ORAL | Status: AC
  Administered 2020-08-26: 650 mg via ORAL

## 2020-08-26 MED ORDER — SODIUM CHLORIDE 0.9% FLUSH
10.0000 mL | INTRAVENOUS | Status: DC | PRN
  Administered 2020-08-26: 10 mL
  Filled 2020-08-26: qty 10

## 2020-08-26 MED ORDER — DIPHENHYDRAMINE HCL 25 MG PO CAPS
ORAL_CAPSULE | ORAL | Status: AC
  Filled 2020-08-26: qty 2

## 2020-08-26 MED ORDER — SODIUM CHLORIDE 0.9% FLUSH
10.0000 mL | Freq: Once | INTRAVENOUS | Status: AC
  Administered 2020-08-26: 10 mL
  Filled 2020-08-26: qty 10

## 2020-08-26 MED ORDER — PALONOSETRON HCL INJECTION 0.25 MG/5ML
0.2500 mg | Freq: Once | INTRAVENOUS | Status: AC
  Administered 2020-08-26: 0.25 mg via INTRAVENOUS

## 2020-08-26 MED ORDER — SODIUM CHLORIDE 0.9 % IV SOLN
150.0000 mg | Freq: Once | INTRAVENOUS | Status: AC
  Administered 2020-08-26: 150 mg via INTRAVENOUS
  Filled 2020-08-26: qty 150

## 2020-08-26 MED ORDER — SODIUM CHLORIDE 0.9 % IV SOLN
Freq: Once | INTRAVENOUS | Status: AC
  Filled 2020-08-26: qty 250

## 2020-08-26 MED ORDER — TRASTUZUMAB-DKST CHEMO 150 MG IV SOLR
6.0000 mg/kg | Freq: Once | INTRAVENOUS | Status: AC
  Administered 2020-08-26: 567 mg via INTRAVENOUS
  Filled 2020-08-26: qty 27

## 2020-08-26 MED ORDER — HEPARIN SOD (PORK) LOCK FLUSH 100 UNIT/ML IV SOLN
500.0000 [IU] | Freq: Once | INTRAVENOUS | Status: AC | PRN
  Administered 2020-08-26: 500 [IU]
  Filled 2020-08-26: qty 5

## 2020-08-26 MED ORDER — DIPHENHYDRAMINE HCL 25 MG PO CAPS
50.0000 mg | ORAL_CAPSULE | Freq: Once | ORAL | Status: AC
  Administered 2020-08-26: 50 mg via ORAL

## 2020-08-26 MED ORDER — ACETAMINOPHEN 325 MG PO TABS
ORAL_TABLET | ORAL | Status: AC
  Filled 2020-08-26: qty 2

## 2020-08-26 MED ORDER — PERTUZUMAB CHEMO INJECTION 420 MG/14ML
420.0000 mg | Freq: Once | INTRAVENOUS | Status: AC
  Administered 2020-08-26: 420 mg via INTRAVENOUS
  Filled 2020-08-26: qty 14

## 2020-08-26 MED ORDER — SODIUM CHLORIDE 0.9 % IV SOLN
75.0000 mg/m2 | Freq: Once | INTRAVENOUS | Status: AC
  Administered 2020-08-26: 160 mg via INTRAVENOUS
  Filled 2020-08-26: qty 16

## 2020-08-26 MED ORDER — CARBOPLATIN CHEMO INJECTION 600 MG/60ML
700.0000 mg | Freq: Once | INTRAVENOUS | Status: AC
Start: 2020-08-26 — End: 2020-08-26
  Administered 2020-08-26: 700 mg via INTRAVENOUS
  Filled 2020-08-26: qty 70

## 2020-08-26 MED ORDER — SODIUM CHLORIDE 0.9 % IV SOLN
10.0000 mg | Freq: Once | INTRAVENOUS | Status: AC
  Administered 2020-08-26: 10 mg via INTRAVENOUS
  Filled 2020-08-26: qty 10

## 2020-08-26 MED ORDER — PALONOSETRON HCL INJECTION 0.25 MG/5ML
INTRAVENOUS | Status: AC
  Filled 2020-08-26: qty 5

## 2020-08-26 NOTE — Assessment & Plan Note (Signed)
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 Right breast benign-appearing calcifications previous stereotactic biopsy was attempted but it was too superficial and felt to be benign.  Recommendationbased on multidisciplinary tumor board: 1. Neoadjuvant chemotherapy with TCH Perjeta 6 cycles followed by Herceptin Perjeta versus Kadcyla maintenance for 1 year 2. Followed by breast conserving surgery if possible with sentinel lymph node study 3. Followed by adjuvant radiation therapy if patient had lumpectomy ------------------------------------------------------------------------------------------------------------------------------------------ Breast MRI 06/15/2020: Inflammatory left breast cancer changes, multicentric contiguous masses 10 cm max, 9 satellite nodules, 9 axillary lymph nodes, enlarged anterior mediastinal lymph node, indeterminate 1.1 cm mass right breast posterior depth  CT CAP 06/16/2020: Extensive cutaneous thickening left breast, metastatic adenopathy left axilla, left pectoralis, left supraclavicular. No other distant metastatic disease.  Bone Scan 06/17/20:Solitary small but suspicious focus of increased radiotracer activity in the proximal shaft of the right humerus. Right shoulder/humerus MRIwill be ordered. ------------------------------------------------------------------------------------------------------------------------------------------------------- Current Treatment:Neoadjuvant chemotherapy with [facility] Perjetatodayiscycle4 Echo 06/15/2020: EF 60 to 65% Labs reviewed  Chemotoxicities: 1.Diarrhea that lasted for 3 days: Instructed to take Imodium once again. 2.fatigue related to chemo 3.Chemo induced anemia: Today's hemoglobin is 9.3. Monitoring 4.  Hypokalemia:  I sent a prescription for potassium.  Blood work has been reviewed.  The chemo dosing will remain the same.  Diabetes: Her primary care physician started her on insulin. This will hopefully control her blood sugars.  Monitoring closely for chemo toxicities. Return to clinic in3weeks for cycle 4

## 2020-08-26 NOTE — Patient Instructions (Signed)
[facility] Discharge Instructions for Patients Receiving Chemotherapy  Today you received the following chemotherapy agents: Trastuzumab (Herceptin), Pertuzumab (Perjeta), Docetaxel (Taxotere), and Carboplatin  To help prevent nausea and vomiting after your treatment, we encourage you to take your nausea medication  as prescribed.    If you develop nausea and vomiting that is not controlled by your nausea medication, call the clinic.   BELOW ARE SYMPTOMS THAT SHOULD BE REPORTED IMMEDIATELY:  *FEVER GREATER THAN 100.5 F  *CHILLS WITH OR WITHOUT FEVER  NAUSEA AND VOMITING THAT IS NOT CONTROLLED WITH YOUR NAUSEA MEDICATION  *UNUSUAL SHORTNESS OF BREATH  *UNUSUAL BRUISING OR BLEEDING  TENDERNESS IN MOUTH AND THROAT WITH OR WITHOUT PRESENCE OF ULCERS  *URINARY PROBLEMS  *BOWEL PROBLEMS  UNUSUAL RASH Items with * indicate a potential emergency and should be followed up as soon as possible.  Feel free to call the clinic should you have any questions or concerns. The clinic phone number is [phone].  Please show the CHEMO ALERT CARD at check-in to the Emergency Department and triage nurse.   

## 2020-08-26 NOTE — Patient Instructions (Signed)

## 2020-08-28 ENCOUNTER — Inpatient Hospital Stay: Payer: 59

## 2020-08-28 ENCOUNTER — Other Ambulatory Visit: Payer: Self-pay

## 2020-08-28 VITALS — BP 111/76 | HR 87 | Temp 97.9°F | Resp 18

## 2020-08-28 DIAGNOSIS — Z170 Estrogen receptor positive status [ER+]: Secondary | ICD-10-CM

## 2020-08-28 DIAGNOSIS — C50212 Malignant neoplasm of upper-inner quadrant of left female breast: Secondary | ICD-10-CM

## 2020-08-28 DIAGNOSIS — Z5111 Encounter for antineoplastic chemotherapy: Secondary | ICD-10-CM | POA: Diagnosis not present

## 2020-08-28 MED ORDER — PEGFILGRASTIM-JMDB 6 MG/0.6ML ~~LOC~~ SOSY
6.0000 mg | PREFILLED_SYRINGE | Freq: Once | SUBCUTANEOUS | Status: AC
  Administered 2020-08-28: 6 mg via SUBCUTANEOUS

## 2020-08-28 MED ORDER — PEGFILGRASTIM-JMDB 6 MG/0.6ML ~~LOC~~ SOSY
PREFILLED_SYRINGE | SUBCUTANEOUS | Status: AC
  Filled 2020-08-28: qty 0.6

## 2020-08-28 NOTE — Patient Instructions (Signed)
Pegfilgrastim injection What is this medicine? PEGFILGRASTIM (PEG fil gra stim) is a long-acting granulocyte colony-stimulating factor that stimulates the growth of neutrophils, a type of white blood cell important in the body's fight against infection. It is used to reduce the incidence of fever and infection in patients with certain types of cancer who are receiving chemotherapy that affects the bone marrow, and to increase survival after being exposed to high doses of radiation. This medicine may be used for other purposes; ask your health care provider or pharmacist if you have questions. COMMON BRAND NAME(S): Fulphila, Neulasta, Nyvepria, UDENYCA, Ziextenzo What should I tell my health care provider before I take this medicine? They need to know if you have any of these conditions:  kidney disease  latex allergy  ongoing radiation therapy  sickle cell disease  skin reactions to acrylic adhesives (On-Body Injector only)  an unusual or allergic reaction to pegfilgrastim, filgrastim, other medicines, foods, dyes, or preservatives  pregnant or trying to get pregnant  breast-feeding How should I use this medicine? This medicine is for injection under the skin. If you get this medicine at home, you will be taught how to prepare and give the pre-filled syringe or how to use the On-body Injector. Refer to the patient Instructions for Use for detailed instructions. Use exactly as directed. Tell your healthcare provider immediately if you suspect that the On-body Injector may not have performed as intended or if you suspect the use of the On-body Injector resulted in a missed or partial dose. It is important that you put your used needles and syringes in a special sharps container. Do not put them in a trash can. If you do not have a sharps container, call your pharmacist or healthcare provider to get one. Talk to your pediatrician regarding the use of this medicine in children. While this drug  may be prescribed for selected conditions, precautions do apply. Overdosage: If you think you have taken too much of this medicine contact a poison control center or emergency room at once. NOTE: This medicine is only for you. Do not share this medicine with others. What if I miss a dose? It is important not to miss your dose. Call your doctor or health care professional if you miss your dose. If you miss a dose due to an On-body Injector failure or leakage, a new dose should be administered as soon as possible using a single prefilled syringe for manual use. What may interact with this medicine? Interactions have not been studied. This list may not describe all possible interactions. Give your health care provider a list of all the medicines, herbs, non-prescription drugs, or dietary supplements you use. Also tell them if you smoke, drink alcohol, or use illegal drugs. Some items may interact with your medicine. What should I watch for while using this medicine? Your condition will be monitored carefully while you are receiving this medicine. You may need blood work done while you are taking this medicine. Talk to your health care provider about your risk of cancer. You may be more at risk for certain types of cancer if you take this medicine. If you are going to need a MRI, CT scan, or other procedure, tell your doctor that you are using this medicine (On-Body Injector only). What side effects may I notice from receiving this medicine? Side effects that you should report to your doctor or health care professional as soon as possible:  allergic reactions (skin rash, itching or hives, swelling of   the face, lips, or tongue)  back pain  dizziness  fever  pain, redness, or irritation at site where injected  pinpoint red spots on the skin  red or dark-brown urine  shortness of breath or breathing problems  stomach or side pain, or pain at the shoulder  swelling  tiredness  trouble  passing urine or change in the amount of urine  unusual bruising or bleeding Side effects that usually do not require medical attention (report to your doctor or health care professional if they continue or are bothersome):  bone pain  muscle pain This list may not describe all possible side effects. Call your doctor for medical advice about side effects. You may report side effects to FDA at 1-800-FDA-1088. Where should I keep my medicine? Keep out of the reach of children. If you are using this medicine at home, you will be instructed on how to store it. Throw away any unused medicine after the expiration date on the label. NOTE: This sheet is a summary. It may not cover all possible information. If you have questions about this medicine, talk to your doctor, pharmacist, or health care provider.  2021 Elsevier/Gold Standard (2019-08-23 13:20:51)  

## 2020-09-01 ENCOUNTER — Encounter: Payer: Self-pay | Admitting: Hematology and Oncology

## 2020-09-01 NOTE — Progress Notes (Signed)
Received call from patient whom I met with previously regarding qualifications of [employer]. Based on verbal income guidelines, patient states she is over the guidelines.  Discussed possible available copay assistance for specific treatment drugs which will help pay if the insurance leaves her with an amount to pay after they have paid. Patient gave me permission to enroll in any available assistance which she qualifies for. Advised her once enrolled, she will receive letters in the mail regarding approval/follow up which will be for her records only. 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 Patient mentioned new insurance plan eff 1/22. Advised her I would forward information to [place] team for processing.  She has my card for any addtional financial questions or concerns.      Message sent to Almyra Brace, and Juliann Pulse regarding new insurance and authorization.

## 2020-09-15 ENCOUNTER — Other Ambulatory Visit: Payer: Self-pay | Admitting: *Deleted

## 2020-09-15 ENCOUNTER — Encounter: Payer: Self-pay | Admitting: Hematology and Oncology

## 2020-09-15 ENCOUNTER — Encounter: Payer: Self-pay | Admitting: *Deleted

## 2020-09-15 DIAGNOSIS — C50212 Malignant neoplasm of upper-inner quadrant of left female breast: Secondary | ICD-10-CM

## 2020-09-15 DIAGNOSIS — Z170 Estrogen receptor positive status [ER+]: Secondary | ICD-10-CM

## 2020-09-15 NOTE — Progress Notes (Signed)
Enrolled patient in copay assistance for Perjeta via [employer]. Patient approved for $25,000 09/15/20 - 11/12/21 leaving her with as little as a $5 copay after insurance pays.   A copy of the approval with credit card information to submit claims is available to [facility] via portal for claim submission.  Patient will receive a copy in the mail for her records only.  She has my card for any additional financial questions or concerns.

## 2020-09-15 NOTE — Progress Notes (Signed)
Enrolled patient in copay assistance for Ogivri and Fulphila via [employer] portal.  Will check status on 09/16/20.

## 2020-09-16 ENCOUNTER — Inpatient Hospital Stay: Payer: 59 | Attending: Hematology and Oncology

## 2020-09-16 ENCOUNTER — Ambulatory Visit: Payer: 59 | Admitting: Hematology and Oncology

## 2020-09-16 ENCOUNTER — Encounter: Payer: Self-pay | Admitting: *Deleted

## 2020-09-16 ENCOUNTER — Other Ambulatory Visit: Payer: Self-pay

## 2020-09-16 ENCOUNTER — Inpatient Hospital Stay: Payer: 59

## 2020-09-16 ENCOUNTER — Encounter: Payer: Self-pay | Admitting: Hematology and Oncology

## 2020-09-16 ENCOUNTER — Inpatient Hospital Stay (HOSPITAL_BASED_OUTPATIENT_CLINIC_OR_DEPARTMENT_OTHER): Payer: 59 | Admitting: Medical

## 2020-09-16 VITALS — BP 130/86 | HR 84 | Temp 98.2°F | Resp 18

## 2020-09-16 DIAGNOSIS — C50212 Malignant neoplasm of upper-inner quadrant of left female breast: Secondary | ICD-10-CM | POA: Diagnosis not present

## 2020-09-16 DIAGNOSIS — Z79899 Other long term (current) drug therapy: Secondary | ICD-10-CM | POA: Diagnosis not present

## 2020-09-16 DIAGNOSIS — R530 Neoplastic (malignant) related fatigue: Secondary | ICD-10-CM | POA: Diagnosis not present

## 2020-09-16 DIAGNOSIS — R590 Localized enlarged lymph nodes: Secondary | ICD-10-CM | POA: Diagnosis not present

## 2020-09-16 DIAGNOSIS — Z95828 Presence of other vascular implants and grafts: Secondary | ICD-10-CM

## 2020-09-16 DIAGNOSIS — E119 Type 2 diabetes mellitus without complications: Secondary | ICD-10-CM | POA: Diagnosis not present

## 2020-09-16 DIAGNOSIS — Z5111 Encounter for antineoplastic chemotherapy: Secondary | ICD-10-CM | POA: Diagnosis present

## 2020-09-16 DIAGNOSIS — Z170 Estrogen receptor positive status [ER+]: Secondary | ICD-10-CM | POA: Diagnosis not present

## 2020-09-16 DIAGNOSIS — Z5112 Encounter for antineoplastic immunotherapy: Secondary | ICD-10-CM | POA: Insufficient documentation

## 2020-09-16 DIAGNOSIS — Z5189 Encounter for other specified aftercare: Secondary | ICD-10-CM | POA: Insufficient documentation

## 2020-09-16 DIAGNOSIS — E876 Hypokalemia: Secondary | ICD-10-CM | POA: Diagnosis not present

## 2020-09-16 DIAGNOSIS — D6481 Anemia due to antineoplastic chemotherapy: Secondary | ICD-10-CM | POA: Insufficient documentation

## 2020-09-16 LAB — CBC WITH DIFFERENTIAL (CANCER CENTER ONLY)
Abs Immature Granulocytes: 0.01 10*3/uL (ref 0.00–0.07)
Basophils Absolute: 0 10*3/uL (ref 0.0–0.1)
Basophils Relative: 1 %
Eosinophils Absolute: 0.1 10*3/uL (ref 0.0–0.5)
Eosinophils Relative: 2 %
HCT: 27.8 % — ABNORMAL LOW (ref 36.0–46.0)
Hemoglobin: 8.9 g/dL — ABNORMAL LOW (ref 12.0–15.0)
Immature Granulocytes: 0 %
Lymphocytes Relative: 35 %
Lymphs Abs: 2 10*3/uL (ref 0.7–4.0)
MCH: 28 pg (ref 26.0–34.0)
MCHC: 32 g/dL (ref 30.0–36.0)
MCV: 87.4 fL (ref 80.0–100.0)
Monocytes Absolute: 0.5 10*3/uL (ref 0.1–1.0)
Monocytes Relative: 9 %
Neutro Abs: 3.1 10*3/uL (ref 1.7–7.7)
Neutrophils Relative %: 53 %
Platelet Count: 214 10*3/uL (ref 150–400)
RBC: 3.18 MIL/uL — ABNORMAL LOW (ref 3.87–5.11)
RDW: 17.4 % — ABNORMAL HIGH (ref 11.5–15.5)
WBC Count: 5.8 10*3/uL (ref 4.0–10.5)
nRBC: 0 % (ref 0.0–0.2)

## 2020-09-16 LAB — CMP (CANCER CENTER ONLY)
ALT: 11 U/L (ref 0–44)
AST: 13 U/L — ABNORMAL LOW (ref 15–41)
Albumin: 3.7 g/dL (ref 3.5–5.0)
Alkaline Phosphatase: 59 U/L (ref 38–126)
Anion gap: 7 (ref 5–15)
BUN: 8 mg/dL (ref 6–20)
CO2: 28 mmol/L (ref 22–32)
Calcium: 9.1 mg/dL (ref 8.9–10.3)
Chloride: 108 mmol/L (ref 98–111)
Creatinine: 0.82 mg/dL (ref 0.44–1.00)
GFR, Estimated: >60 mL/min (ref >60)
Glucose, Bld: 163 mg/dL — ABNORMAL HIGH (ref 70–99)
Potassium: 3.4 mmol/L — ABNORMAL LOW (ref 3.5–5.1)
Sodium: 143 mmol/L (ref 135–145)
Total Bilirubin: 0.5 mg/dL (ref 0.3–1.2)
Total Protein: 6.5 g/dL (ref 6.5–8.1)

## 2020-09-16 MED ORDER — ACETAMINOPHEN 325 MG PO TABS
ORAL_TABLET | ORAL | Status: AC
  Filled 2020-09-16: qty 2

## 2020-09-16 MED ORDER — SODIUM CHLORIDE 0.9 % IV SOLN
420.0000 mg | Freq: Once | INTRAVENOUS | Status: AC
  Administered 2020-09-16: 420 mg via INTRAVENOUS
  Filled 2020-09-16: qty 14

## 2020-09-16 MED ORDER — PALONOSETRON HCL INJECTION 0.25 MG/5ML
INTRAVENOUS | Status: AC
  Filled 2020-09-16: qty 5

## 2020-09-16 MED ORDER — SODIUM CHLORIDE 0.9 % IV SOLN
10.0000 mg | Freq: Once | INTRAVENOUS | Status: AC
  Administered 2020-09-16: 10 mg via INTRAVENOUS
  Filled 2020-09-16: qty 10

## 2020-09-16 MED ORDER — PALONOSETRON HCL INJECTION 0.25 MG/5ML
0.2500 mg | Freq: Once | INTRAVENOUS | Status: AC
  Administered 2020-09-16: 0.25 mg via INTRAVENOUS

## 2020-09-16 MED ORDER — SODIUM CHLORIDE 0.9 % IV SOLN
Freq: Once | INTRAVENOUS | Status: AC
  Filled 2020-09-16: qty 250

## 2020-09-16 MED ORDER — SODIUM CHLORIDE 0.9 % IV SOLN
150.0000 mg | Freq: Once | INTRAVENOUS | Status: AC
  Administered 2020-09-16: 150 mg via INTRAVENOUS
  Filled 2020-09-16: qty 150

## 2020-09-16 MED ORDER — TRASTUZUMAB-DKST CHEMO 150 MG IV SOLR
6.0000 mg/kg | Freq: Once | INTRAVENOUS | Status: AC
  Administered 2020-09-16: 567 mg via INTRAVENOUS
  Filled 2020-09-16: qty 27

## 2020-09-16 MED ORDER — ACETAMINOPHEN 325 MG PO TABS
650.0000 mg | ORAL_TABLET | Freq: Once | ORAL | Status: AC
  Administered 2020-09-16: 650 mg via ORAL

## 2020-09-16 MED ORDER — SODIUM CHLORIDE 0.9 % IV SOLN
700.0000 mg | Freq: Once | INTRAVENOUS | Status: AC
  Administered 2020-09-16: 700 mg via INTRAVENOUS
  Filled 2020-09-16: qty 70

## 2020-09-16 MED ORDER — SODIUM CHLORIDE 0.9 % IV SOLN
75.0000 mg/m2 | Freq: Once | INTRAVENOUS | Status: AC
  Administered 2020-09-16: 160 mg via INTRAVENOUS
  Filled 2020-09-16: qty 16

## 2020-09-16 MED ORDER — DIPHENHYDRAMINE HCL 25 MG PO CAPS
ORAL_CAPSULE | ORAL | Status: AC
  Filled 2020-09-16: qty 2

## 2020-09-16 MED ORDER — SODIUM CHLORIDE 0.9% FLUSH
10.0000 mL | Freq: Once | INTRAVENOUS | Status: AC
Start: 2020-09-16 — End: 2020-09-16
  Administered 2020-09-16: 10 mL
  Filled 2020-09-16: qty 10

## 2020-09-16 MED ORDER — DIPHENHYDRAMINE HCL 25 MG PO CAPS
50.0000 mg | ORAL_CAPSULE | Freq: Once | ORAL | Status: AC
  Administered 2020-09-16: 50 mg via ORAL

## 2020-09-16 NOTE — Progress Notes (Signed)
Received approval letters from [facility] for copay assistance.  Patient approved for $25,000 for Ogivri 09/16/20 - 09/15/21 leaving her with a $0 patient responsibility after insurance pays.  Patient approved for $10,000 for Fulphila 09/16/20 - 09/15/21 leaving her with a $0 patient responsibility after insurance pays.  Copy of approval letters given to [facility] for billing/copay submissions.  Copy of letters will be mailed to patient for her records only.  She has my card for any additional financial questions or concerns.

## 2020-09-16 NOTE — Patient Instructions (Signed)
[facility] Discharge Instructions for Patients Receiving Chemotherapy  Today you received the following chemotherapy agents: Trastuzumab (Herceptin), Pertuzumab (Perjeta), Docetaxel (Taxotere), and Carboplatin  To help prevent nausea and vomiting after your treatment, we encourage you to take your nausea medication  as prescribed.    If you develop nausea and vomiting that is not controlled by your nausea medication, call the clinic.   BELOW ARE SYMPTOMS THAT SHOULD BE REPORTED IMMEDIATELY:  *FEVER GREATER THAN 100.5 F  *CHILLS WITH OR WITHOUT FEVER  NAUSEA AND VOMITING THAT IS NOT CONTROLLED WITH YOUR NAUSEA MEDICATION  *UNUSUAL SHORTNESS OF BREATH  *UNUSUAL BRUISING OR BLEEDING  TENDERNESS IN MOUTH AND THROAT WITH OR WITHOUT PRESENCE OF ULCERS  *URINARY PROBLEMS  *BOWEL PROBLEMS  UNUSUAL RASH Items with * indicate a potential emergency and should be followed up as soon as possible.  Feel free to call the clinic should you have any questions or concerns. The clinic phone number is [phone].  Please show the CHEMO ALERT CARD at check-in to the Emergency Department and triage nurse.   

## 2020-09-16 NOTE — Progress Notes (Signed)
Pt discharged in no apparent distress. Pt left ambulatory without assistance. Pt aware of discharge instructions and verbalized understanding and had no further questions.  

## 2020-09-16 NOTE — Progress Notes (Signed)
Nutrition Follow-up:  Patient with left breast cancer.  Patient receiving neoadjuvant chemotherapy.   Met with patient during infusion.  Patient reports good appetite. Diarrhea has improved, has 2 episodes every 3 days or so.  Takes imodium.  Reports eating pancakes, omelet yesterday for breakfast.  Snacked on peanut butter crackers, fruit and cheese. For dinner ate ribs, macaroni and cheese and baked beans.    Medications: reviewed  Labs: reviewed  Anthropometrics:   Weight 191 lb 3.2 oz 1/12, no new weight today. 193 lb on 12/22 198 lb on 12/1   NUTRITION DIAGNOSIS: Food and nutrition related knowledge deficit improved   INTERVENTION:  Continue well balanced diet with good sources of protein for weight maintenance during treatment RD available if needed in the future.  Patient has contact information   NEXT VISIT: no follow-up, available as needed  Robin Kennedy B. Zenia Resides, [place], [place] Registered Dietitian [phone] (mobile)

## 2020-09-16 NOTE — Patient Instructions (Signed)

## 2020-09-18 ENCOUNTER — Inpatient Hospital Stay: Payer: 59

## 2020-09-18 ENCOUNTER — Other Ambulatory Visit: Payer: Self-pay

## 2020-09-18 VITALS — BP 115/82 | HR 94 | Temp 98.9°F | Resp 18

## 2020-09-18 DIAGNOSIS — Z5112 Encounter for antineoplastic immunotherapy: Secondary | ICD-10-CM | POA: Diagnosis not present

## 2020-09-18 DIAGNOSIS — C50212 Malignant neoplasm of upper-inner quadrant of left female breast: Secondary | ICD-10-CM

## 2020-09-18 DIAGNOSIS — Z170 Estrogen receptor positive status [ER+]: Secondary | ICD-10-CM

## 2020-09-18 MED ORDER — PEGFILGRASTIM-JMDB 6 MG/0.6ML ~~LOC~~ SOSY
6.0000 mg | PREFILLED_SYRINGE | Freq: Once | SUBCUTANEOUS | Status: AC
  Administered 2020-09-18: 6 mg via SUBCUTANEOUS

## 2020-09-18 MED ORDER — PEGFILGRASTIM-JMDB 6 MG/0.6ML ~~LOC~~ SOSY
PREFILLED_SYRINGE | SUBCUTANEOUS | Status: AC
  Filled 2020-09-18: qty 0.6

## 2020-09-18 NOTE — Progress Notes (Signed)
[facility] Progress Note   Robin Kennedy DI:[id number] 1975-10-14 45 y.o.  Robin Kennedy is managed by Dr. Nicholas Lose  Actively treated with chemotherapy/immunotherapy/hormonal therapy: yes  Current therapy: [place] P  Last treated: 08/26/2020 (cycle 4, day 1)  Next scheduled appointment with provider: 10/07/2020  Assessment: Plan:    Malignant neoplasm of upper-inner quadrant of left breast in female, estrogen receptor positive ([place]) - Plan: ECHOCARDIOGRAM COMPLETE  Hypokalemia   ER positive malignant neoplasm of the left breast: [name]  presents to the clinic today for consideration of cycle 5, day 1 of TCHP.  Review of her chart shows that her last echocardiogram from 06/15/2020 returned showing an EF of 60 to 65%.  She has been referred for a follow-up echocardiogram.  We will proceed with her treatment today and will have her return as scheduled on 10/07/2020 for visit with Dr. Nicholas Lose and for consideration of cycle 6 of chemotherapy.  Hypokalemia: A chemistry panel returned with a potassium of 3.4 today.  Patient was told to increase her intake of potassium rich foods such as oranges, orange juice, bananas, and/or avocados.  Please see After Visit Summary for patient specific instructions.  Future Appointments  Date Time Provider [place]  09/18/2020 12:30 PM [facility] [place] FLUSH [facility] None  10/05/2020 10:20 AM [facility] DIAG TOMO 1 [facility]BCGMM GI-BREAST CE  10/07/2020  8:00 AM [facility] LAB [facility] None  10/07/2020  8:15 AM [facility] [place] FLUSH [facility] None  10/07/2020  8:45 AM Nicholas Lose, MD [facility] None  10/07/2020 10:00 AM [facility] INFUSION [facility] None  10/08/2020  9:50 AM GI-315 MR 1 GI-315MRI GI-315 [address]  10/09/2020 12:45 PM [facility] [place] FLUSH [facility] None    Orders Placed This Encounter  Procedures  . ECHOCARDIOGRAM COMPLETE       Subjective:   Patient ID:  Jacqualyn Laudicina is a 45  y.o. (DOB 1976/01/11) female.  Chief Complaint: No chief complaint on file.   HPI Robin Kennedy  is a 45 y.o. female with a diagnosis of an ER positive malignant neoplasm of the left breast.  She is followed by Dr. Nicholas Lose and presents to the clinic today for consideration of cycle 5, day 1 of TCHP.  She continues to do exceptionally well without any acute issues of concern.  Review of her chart shows that her last echocardiogram completed on 06/15/2020 returned with an EF of 60 to 65%.  She will be referred for a repeat echocardiogram.  She is scheduled to be seen next by Dr. Nicholas Lose for consideration of cycle 6 of chemotherapy on 10/07/2020.  Medications: I have reviewed the patient's current medications.  Allergies: No Known Allergies  Past Medical History:  Diagnosis Date  . Diabetes mellitus without complication ([place])   . SVD (spontaneous vaginal delivery)    X 3    Past Surgical History:  Procedure Laterality Date  . CYSTOSCOPY N/A 12/20/2016   Procedure: CYSTOSCOPY;  Surgeon: Lavonia Drafts, MD;  Location: [facility];  Service: Gynecology;  Laterality: N/A;  . PORTACATH PLACEMENT Left 06/23/2020   Procedure: INSERTION PORT-A-CATH WITH ULTRASOUND GUIDANCE;  Surgeon: [name], MD;  Location: [facility];  Service: General;  Laterality: Left;  . ROBOTIC ASSISTED TOTAL HYSTERECTOMY WITH BILATERAL SALPINGO OOPHERECTOMY Bilateral 12/20/2016   Procedure: ROBOTIC ASSISTED TOTAL HYSTERECTOMY WITH BILATERAL SALPINGO OOPHORECTOMY;  Surgeon: Lavonia Drafts, MD;  Location: [facility];  Service: Gynecology;  Laterality: Bilateral;  . TUBAL LIGATION    . WISDOM TOOTH EXTRACTION  Family History  Problem Relation Age of Onset  . Diabetes Brother   . Asthma Daughter   . GER disease Daughter   . Stroke Paternal Grandmother   . Breast cancer Paternal Grandmother        unknown age  . Sarcoidosis Mother   . Hyperlipidemia Maternal Aunt   .  Stroke Maternal Uncle     Social History   Socioeconomic History  . Marital status: Married    Spouse name: Not on file  . Number of children: Not on file  . Years of education: Not on file  . Highest education level: Not on file  Occupational History  . Not on file  Tobacco Use  . Smoking status: Never Smoker  . Smokeless tobacco: Never Used  Vaping Use  . Vaping Use: Never used  Substance and Sexual Activity  . Alcohol use: No  . Drug use: No  . Sexual activity: Yes    Birth control/protection: Surgical  Other Topics Concern  . Not on file  Social History Narrative   ** Merged History Encounter **       Social Determinants of Health   Financial Resource Strain: Not on file  Food Insecurity: No Food Insecurity  . Worried About Charity fundraiser in the Last Year: Never true  . Ran Out of Food in the Last Year: Never true  Transportation Needs: No Transportation Needs  . Lack of Transportation (Medical): No  . Lack of Transportation (Non-Medical): No  Physical Activity: Not on file  Stress: Not on file  Social Connections: Not on file  Intimate Partner Violence: Not on file    Past Medical History, Surgical history, Social history, and Family history were reviewed and updated as appropriate.   Please see review of systems for further details on the patient's review from today.   Review of Systems:  Review of Systems  Constitutional: Negative for chills, diaphoresis and fever.  HENT: Negative for trouble swallowing and voice change.   Respiratory: Negative for cough, chest tightness, shortness of breath and wheezing.   Cardiovascular: Negative for chest pain and palpitations.  Gastrointestinal: Negative for abdominal pain, constipation, diarrhea, nausea and vomiting.  Musculoskeletal: Negative for back pain and myalgias.  Neurological: Negative for dizziness, light-headedness and headaches.    Objective:   Physical Exam:  LMP 11/06/2016 (Exact Date)   ECOG: 0  Physical Exam Constitutional:      General: She is not in acute distress.    Appearance: She is not diaphoretic.  HENT:     Head: Normocephalic and atraumatic.  Eyes:     General: No scleral icterus.       Right eye: No discharge.        Left eye: No discharge.     Conjunctiva/sclera: Conjunctivae normal.  Cardiovascular:     Rate and Rhythm: Normal rate and regular rhythm.     Heart sounds: Normal heart sounds. No murmur heard. No friction rub. No gallop.   Pulmonary:     Effort: Pulmonary effort is normal. No respiratory distress.     Breath sounds: Normal breath sounds. No wheezing or rales.  Skin:    General: Skin is warm and dry.     Findings: No erythema or rash.  Neurological:     Mental Status: She is alert.  Psychiatric:        Mood and Affect: Mood normal.        Behavior: Behavior normal.  Thought Content: Thought content normal.        Judgment: Judgment normal.     Lab Review:     Component Value Date/Time   NA 143 09/16/2020 0809   NA 138 07/18/2018 1632   K 3.4 (L) 09/16/2020 0809   CL 108 09/16/2020 0809   CO2 28 09/16/2020 0809   GLUCOSE 163 (H) 09/16/2020 0809   BUN 8 09/16/2020 0809   BUN 9 07/18/2018 1632   CREATININE 0.82 09/16/2020 0809   CREATININE 0.66 06/06/2012 1746   CALCIUM 9.1 09/16/2020 0809   PROT 6.5 09/16/2020 0809   PROT 6.9 07/18/2018 1632   ALBUMIN 3.7 09/16/2020 0809   ALBUMIN 4.6 07/18/2018 1632   AST 13 (L) 09/16/2020 0809   ALT 11 09/16/2020 0809   ALKPHOS 59 09/16/2020 0809   BILITOT 0.5 09/16/2020 0809   GFRNONAA >60 09/16/2020 0809   GFRAA 129 07/18/2018 1632       Component Value Date/Time   WBC 5.8 09/16/2020 0809   WBC 10.1 12/21/2016 0552   RBC 3.18 (L) 09/16/2020 0809   HGB 8.9 (L) 09/16/2020 0809   HGB 12.4 09/13/2019 1042   HCT 27.8 (L) 09/16/2020 0809   HCT 37.9 09/13/2019 1042   PLT 214 09/16/2020 0809   PLT 269 09/13/2019 1042   MCV 87.4 09/16/2020 0809   MCV 84 09/13/2019 1042    MCH 28.0 09/16/2020 0809   MCHC 32.0 09/16/2020 0809   RDW 17.4 (H) 09/16/2020 0809   RDW 12.7 09/13/2019 1042   LYMPHSABS 2.0 09/16/2020 0809   LYMPHSABS 3.1 07/18/2018 1632   MONOABS 0.5 09/16/2020 0809   EOSABS 0.1 09/16/2020 0809   EOSABS 0.1 07/18/2018 1632   BASOSABS 0.0 09/16/2020 0809   BASOSABS 0.0 07/18/2018 1632   -------------------------------  Imaging from last 24 hours (if applicable):  Radiology interpretation: No results found.

## 2020-09-18 NOTE — Patient Instructions (Signed)
Pegfilgrastim injection What is this medicine? PEGFILGRASTIM (PEG fil gra stim) is a long-acting granulocyte colony-stimulating factor that stimulates the growth of neutrophils, a type of white blood cell important in the body's fight against infection. It is used to reduce the incidence of fever and infection in patients with certain types of cancer who are receiving chemotherapy that affects the bone marrow, and to increase survival after being exposed to high doses of radiation. This medicine may be used for other purposes; ask your health care provider or pharmacist if you have questions. COMMON BRAND NAME(S): Fulphila, Neulasta, Nyvepria, UDENYCA, Ziextenzo What should I tell my health care provider before I take this medicine? They need to know if you have any of these conditions:  kidney disease  latex allergy  ongoing radiation therapy  sickle cell disease  skin reactions to acrylic adhesives (On-Body Injector only)  an unusual or allergic reaction to pegfilgrastim, filgrastim, other medicines, foods, dyes, or preservatives  pregnant or trying to get pregnant  breast-feeding How should I use this medicine? This medicine is for injection under the skin. If you get this medicine at home, you will be taught how to prepare and give the pre-filled syringe or how to use the On-body Injector. Refer to the patient Instructions for Use for detailed instructions. Use exactly as directed. Tell your healthcare provider immediately if you suspect that the On-body Injector may not have performed as intended or if you suspect the use of the On-body Injector resulted in a missed or partial dose. It is important that you put your used needles and syringes in a special sharps container. Do not put them in a trash can. If you do not have a sharps container, call your pharmacist or healthcare provider to get one. Talk to your pediatrician regarding the use of this medicine in children. While this drug  may be prescribed for selected conditions, precautions do apply. Overdosage: If you think you have taken too much of this medicine contact a poison control center or emergency room at once. NOTE: This medicine is only for you. Do not share this medicine with others. What if I miss a dose? It is important not to miss your dose. Call your doctor or health care professional if you miss your dose. If you miss a dose due to an On-body Injector failure or leakage, a new dose should be administered as soon as possible using a single prefilled syringe for manual use. What may interact with this medicine? Interactions have not been studied. This list may not describe all possible interactions. Give your health care provider a list of all the medicines, herbs, non-prescription drugs, or dietary supplements you use. Also tell them if you smoke, drink alcohol, or use illegal drugs. Some items may interact with your medicine. What should I watch for while using this medicine? Your condition will be monitored carefully while you are receiving this medicine. You may need blood work done while you are taking this medicine. Talk to your health care provider about your risk of cancer. You may be more at risk for certain types of cancer if you take this medicine. If you are going to need a MRI, CT scan, or other procedure, tell your doctor that you are using this medicine (On-Body Injector only). What side effects may I notice from receiving this medicine? Side effects that you should report to your doctor or health care professional as soon as possible:  allergic reactions (skin rash, itching or hives, swelling of   the face, lips, or tongue)  back pain  dizziness  fever  pain, redness, or irritation at site where injected  pinpoint red spots on the skin  red or dark-brown urine  shortness of breath or breathing problems  stomach or side pain, or pain at the shoulder  swelling  tiredness  trouble  passing urine or change in the amount of urine  unusual bruising or bleeding Side effects that usually do not require medical attention (report to your doctor or health care professional if they continue or are bothersome):  bone pain  muscle pain This list may not describe all possible side effects. Call your doctor for medical advice about side effects. You may report side effects to FDA at 1-800-FDA-1088. Where should I keep my medicine? Keep out of the reach of children. If you are using this medicine at home, you will be instructed on how to store it. Throw away any unused medicine after the expiration date on the label. NOTE: This sheet is a summary. It may not cover all possible information. If you have questions about this medicine, talk to your doctor, pharmacist, or health care provider.  2021 Elsevier/Gold Standard (2019-08-23 13:20:51)  

## 2020-09-29 ENCOUNTER — Other Ambulatory Visit: Payer: Self-pay

## 2020-09-29 ENCOUNTER — Ambulatory Visit (HOSPITAL_COMMUNITY)
Admission: RE | Admit: 2020-09-29 | Discharge: 2020-09-29 | Disposition: A | Discharge status: Home / Self Care | Payer: 59 | Source: Ambulatory Visit | Attending: Medical | Admitting: Medical

## 2020-09-29 DIAGNOSIS — C50212 Malignant neoplasm of upper-inner quadrant of left female breast: Secondary | ICD-10-CM | POA: Diagnosis not present

## 2020-09-29 DIAGNOSIS — E119 Type 2 diabetes mellitus without complications: Secondary | ICD-10-CM | POA: Insufficient documentation

## 2020-09-29 DIAGNOSIS — Z01810 Encounter for preprocedural cardiovascular examination: Secondary | ICD-10-CM | POA: Diagnosis not present

## 2020-09-29 DIAGNOSIS — I517 Cardiomegaly: Secondary | ICD-10-CM | POA: Diagnosis not present

## 2020-09-29 DIAGNOSIS — Z170 Estrogen receptor positive status [ER+]: Secondary | ICD-10-CM | POA: Insufficient documentation

## 2020-09-29 LAB — ECHOCARDIOGRAM COMPLETE
Area-P 1/2: 3.53 cm2
S' Lateral: 2.6 cm

## 2020-09-29 NOTE — Progress Notes (Signed)
  Echocardiogram 2D Echocardiogram with 3D has been performed.  Robin Kennedy M 09/29/2020, 9:38 AM

## 2020-10-05 ENCOUNTER — Ambulatory Visit
Admission: RE | Admit: 2020-10-05 | Discharge: 2020-10-05 | Disposition: A | Discharge status: Home / Self Care | Payer: 59 | Source: Ambulatory Visit | Attending: Obstetrics & Gynecology | Admitting: Obstetrics & Gynecology

## 2020-10-05 ENCOUNTER — Other Ambulatory Visit: Payer: Self-pay

## 2020-10-05 DIAGNOSIS — R921 Mammographic calcification found on diagnostic imaging of breast: Secondary | ICD-10-CM

## 2020-10-05 HISTORY — DX: Personal history of antineoplastic chemotherapy: Z92.21

## 2020-10-06 NOTE — Progress Notes (Signed)
Patient Care Team: Lavonia Drafts, MD as PCP - General (Obstetrics and Gynecology) Shawnee Knapp, MD (Family Medicine) Mauro Kaufmann, RN as Oncology Nurse Navigator Rockwell Germany, RN as Oncology Nurse Navigator [name], MD as Consulting Physician (General Surgery) Nicholas Lose, MD as Consulting Physician (Hematology and Oncology) Eppie Gibson, MD as Attending Physician (Radiation Oncology)  DIAGNOSIS:    ICD-10-CM   1. Malignant neoplasm of upper-inner quadrant of left breast in female, estrogen receptor positive ([place])  C50.212 CT CHEST ABDOMEN PELVIS W CONTRAST   Z17.0     SUMMARY OF ONCOLOGIC HISTORY: Oncology History  Malignant neoplasm of upper-inner quadrant of left breast in female, estrogen receptor positive ([place])  06/05/2020 Initial Diagnosis   06/05/2020: Enlarging left breast with pain and diffuse skin thickening: Mammogram revealed 3.5 cm mass 11 o'clock position with 3 abnormal lymph nodes: 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 Right breast benign-appearing calcifications previous stereotactic biopsy was attempted but it was too superficial and felt to be benign.   06/25/2020 -  Chemotherapy    Patient is on Treatment Plan: BREAST  DOCETAXEL + CARBOPLATIN + TRASTUZUMAB + PERTUZUMAB  (TCHP) Q21D       06/29/2020 Genetic Testing   Negative genetic testing: no pathogenic variants detected in [facility] Common Hereditary Cancers Panel.  The report date is June 29, 2020.   The Common Hereditary Cancers Panel offered by [facility] includes sequencing and/or deletion duplication testing of the following 48 genes: APC, ATM, AXIN2, BARD1, BMPR1A, BRCA1, BRCA2, BRIP1, CDH1, CDK4, CDKN2A (p14ARF), CDKN2A (p16INK4a), CHEK2, CTNNA1, DICER1, EPCAM (Deletion/duplication testing only), GREM1 (promoter region deletion/duplication testing only), KIT, MEN1, MLH1, MSH2, MSH3, MSH6, MUTYH, NBN, NF1, NHTL1, PALB2, PDGFRA, PMS2,  POLD1, POLE, PTEN, RAD50, RAD51C, RAD51D, RNF43, SDHB, SDHC, SDHD, SMAD4, SMARCA4. STK11, TP53, TSC1, TSC2, and VHL.  The following genes were evaluated for sequence changes only: SDHA and HOXB13 c.251G>A variant only.     CHIEF COMPLIANT: Cycle6TCH Perjeta  INTERVAL HISTORY: Robin Kennedy is a 45 y.o. with above-mentioned history of inflammatory left breast cancercurrently on neoadjuvant chemotherapy with TCH Perjeta.Echo on 09/29/20 showed an ejection fraction of 60-65%. She presents to the clinic todayfora toxicity checkandcycle6.  ALLERGIES:  has No Known Allergies.  MEDICATIONS:  Current Outpatient Medications  Medication Sig Dispense Refill  . glimepiride (AMARYL) 4 MG tablet Take 4 mg by mouth 2 (two) times daily.  6  . glucose blood (ONE TOUCH ULTRA TEST) test strip Use to check cbgs qd 100 each 11  . ibuprofen (ADVIL,MOTRIN) 600 MG tablet Take 1 tablet (600 mg total) by mouth every 6 (six) hours as needed (mild pain). (Patient not taking: Reported on 05/27/2020) 30 tablet 0  . lidocaine-prilocaine (EMLA) cream Apply to affected area once 30 g 3  . LORazepam (ATIVAN) 0.5 MG tablet Take 1 tablet (0.5 mg total) by mouth at bedtime as needed for sleep. 30 tablet 1  . metFORMIN (GLUCOPHAGE-XR) 500 MG 24 hr tablet Take 1,000 mg by mouth 2 (two) times daily.  3  . ondansetron (ZOFRAN) 8 MG tablet Take 1 tablet (8 mg total) by mouth 2 (two) times daily as needed (Nausea or vomiting). Start on the third day after chemotherapy. 30 tablet 1  . oxyCODONE (OXY IR/ROXICODONE) 5 MG immediate release tablet Take 1 tablet (5 mg total) by mouth every 6 (six) hours as needed for severe pain. 10 tablet 0  . potassium chloride SA (KLOR-CON) 20  MEQ tablet Take 1 tablet (20 mEq total) by mouth daily. 30 tablet 0  . prochlorperazine (COMPAZINE) 10 MG tablet Take 1 tablet (10 mg total) by mouth every 6 (six) hours as needed (Nausea or vomiting). 30 tablet 1   No current facility-administered  medications for this visit.    PHYSICAL EXAMINATION: ECOG PERFORMANCE STATUS: 1 - Symptomatic but completely ambulatory  Vitals:   10/07/20 0849  BP: 134/75  Pulse: 97  Resp: 18  Temp: 97.7 F (36.5 C)  SpO2: 100%   Filed Weights   10/07/20 0849  Weight: 190 lb 3.2 oz (86.3 kg)    LABORATORY DATA:  I have reviewed the data as listed CMP Latest Ref Rng & Units 09/16/2020 08/26/2020 08/05/2020  Glucose 70 - 99 mg/dL 163(H) 134(H) 104(H)  BUN 6 - 20 mg/dL '8 12 8  ' Creatinine 0.44 - 1.00 mg/dL 0.82 0.74 0.74  Sodium 135 - 145 mmol/L 143 142 145  Potassium 3.5 - 5.1 mmol/L 3.4(L) 3.5 2.9(L)  Chloride 98 - 111 mmol/L 108 107 107  CO2 22 - 32 mmol/L '28 25 27  ' Calcium 8.9 - 10.3 mg/dL 9.1 9.3 9.1  Total Protein 6.5 - 8.1 g/dL 6.5 6.7 6.4(L)  Total Bilirubin 0.3 - 1.2 mg/dL 0.5 0.6 0.6  Alkaline Phos 38 - 126 U/L 59 57 54  AST 15 - 41 U/L 13(L) 10(L) 10(L)  ALT 0 - 44 U/L '11 9 13    ' Lab Results  Component Value Date   WBC 4.9 10/07/2020   HGB 8.4 (L) 10/07/2020   HCT 27.2 (L) 10/07/2020   MCV 91.3 10/07/2020   PLT 239 10/07/2020   NEUTROABS 2.7 10/07/2020    ASSESSMENT & PLAN:  Malignant neoplasm of upper-inner quadrant of left breast in female, estrogen receptor positive ([place]) 06/05/2020: Enlarging left breast with pain and diffuse skin thickening: Mammogram revealed 3.5 cm mass 11 o'clock position with 3 abnormal lymph nodes: 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 Right breast benign-appearing calcifications previous stereotactic biopsy was attempted but it was too superficial and felt to be benign.  Recommendationbased on multidisciplinary tumor board: 1. Neoadjuvant chemotherapy with TCH Perjeta 6 cycles followed by Herceptin Perjeta versus Kadcyla maintenance for 1 year 2. Followed by mastectomy with ALN D 3. Followed by adjuvant radiation therapy if patient had  lumpectomy ------------------------------------------------------------------------------------------------------------------------------------------ Breast MRI 06/15/2020: Inflammatory left breast cancer changes, multicentric contiguous masses 10 cm max, 9 satellite nodules, 9 axillary lymph nodes, enlarged anterior mediastinal lymph node, indeterminate 1.1 cm mass right breast posterior depth  CT CAP 06/16/2020: Extensive cutaneous thickening left breast, metastatic adenopathy left axilla, left pectoralis, left supraclavicular. No other distant metastatic disease.  Bone Scan 06/17/20:Solitary small but suspicious focus of increased radiotracer activity in the proximal shaft of the right humerus. Right shoulder/humerus MRIwill be ordered. ------------------------------------------------------------------------------------------------------------------------------------------------------- Current Treatment:Neoadjuvant chemotherapy with [facility] Perjetatodayiscycle6 Echo 06/15/2020: EF 60 to 65% Labs reviewed  Chemotoxicities: 1.Diarrhea: Imodium is being taken as preventatively and it is helped her a lot. 2.fatigue related to chemo 3.Chemo induced anemia: Today's hemoglobin is8.4. Monitoring 4.Hypokalemia: Currently on potassium.  To evaluate supraclavicular lymph node I will order a CT chest abdomen pelvis. Blood work has been reviewed.  The chemo dosing will remain the same.  Diabetes: Monitoring her blood sugars.  Monitoring closely for chemo toxicities. Breast MRI scheduled for 2/24 F/U with Surgery. We will set up a HP apt in 3 weeks  RTC after surgery to  discuss further adj chemo plan    Orders Placed This Encounter  Procedures  . CT CHEST ABDOMEN PELVIS W CONTRAST    Standing Status:   Future    Standing Expiration Date:   10/07/2021    Order Specific Question:   If indicated for the ordered procedure, I authorize the administration of contrast media per  Radiology protocol    Answer:   Yes    Order Specific Question:   Is patient pregnant?    Answer:   No    Order Specific Question:   Preferred imaging location?    Answer:   [facility]    Order Specific Question:   Release to patient    Answer:   Immediate    Order Specific Question:   Is Oral Contrast requested for this exam?    Answer:   Yes, Per Radiology protocol    Order Specific Question:   Reason for Exam (SYMPTOM  OR DIAGNOSIS REQUIRED)    Answer:   Enlarged LN evaluation post chemo   The patient has a good understanding of the overall plan. she agrees with it. she will call with any problems that may develop before the next visit here.  Total time spent: 30 mins including face to face time and time spent for planning, charting and coordination of care  Rulon Eisenmenger, MD, MPH 10/07/2020  I, Cloyde Reams Dorshimer, am acting as scribe for Dr. Nicholas Lose.  I have reviewed the above documentation for accuracy and completeness, and I agree with the above.

## 2020-10-06 NOTE — Assessment & Plan Note (Signed)
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 Right breast benign-appearing calcifications previous stereotactic biopsy was attempted but it was too superficial and felt to be benign.  Recommendationbased on multidisciplinary tumor board: 1. Neoadjuvant chemotherapy with TCH Perjeta 6 cycles followed by Herceptin Perjeta versus Kadcyla maintenance for 1 year 2. Followed by breast conserving surgery if possible with sentinel lymph node study 3. Followed by adjuvant radiation therapy if patient had lumpectomy ------------------------------------------------------------------------------------------------------------------------------------------ Breast MRI 06/15/2020: Inflammatory left breast cancer changes, multicentric contiguous masses 10 cm max, 9 satellite nodules, 9 axillary lymph nodes, enlarged anterior mediastinal lymph node, indeterminate 1.1 cm mass right breast posterior depth  CT CAP 06/16/2020: Extensive cutaneous thickening left breast, metastatic adenopathy left axilla, left pectoralis, left supraclavicular. No other distant metastatic disease.  Bone Scan 06/17/20:Solitary small but suspicious focus of increased radiotracer activity in the proximal shaft of the right humerus. Right shoulder/humerus MRIwill be ordered. ------------------------------------------------------------------------------------------------------------------------------------------------------- Current Treatment:Neoadjuvant chemotherapy with [facility] Perjetatodayiscycle6 Echo 06/15/2020: EF 60 to 65% Labs reviewed  Chemotoxicities: 1.Diarrhea: Imodium is being taken as preventatively and it is helped her a lot. 2.fatigue related to chemo 3.Chemo induced anemia: Today's hemoglobin is9.4.  Monitoring 4.Hypokalemia: Currently on potassium.  Blood work has been reviewed.  The chemo dosing will remain the same.  Diabetes: Monitoring her blood sugars.  Monitoring closely for chemo toxicities. Breast MRI scheduled for 2/24 F/U with Surgery. We will set up a HP apt in 3 weeks  RTC after surgery to discuss further adj chemo plan

## 2020-10-07 ENCOUNTER — Inpatient Hospital Stay: Payer: 59

## 2020-10-07 ENCOUNTER — Other Ambulatory Visit: Payer: Self-pay

## 2020-10-07 ENCOUNTER — Inpatient Hospital Stay (HOSPITAL_BASED_OUTPATIENT_CLINIC_OR_DEPARTMENT_OTHER): Payer: 59 | Admitting: Hematology and Oncology

## 2020-10-07 ENCOUNTER — Encounter: Payer: Self-pay | Admitting: *Deleted

## 2020-10-07 ENCOUNTER — Encounter: Payer: Self-pay | Admitting: General Practice

## 2020-10-07 DIAGNOSIS — C50212 Malignant neoplasm of upper-inner quadrant of left female breast: Secondary | ICD-10-CM | POA: Diagnosis not present

## 2020-10-07 DIAGNOSIS — Z5112 Encounter for antineoplastic immunotherapy: Secondary | ICD-10-CM | POA: Diagnosis not present

## 2020-10-07 DIAGNOSIS — Z170 Estrogen receptor positive status [ER+]: Secondary | ICD-10-CM

## 2020-10-07 DIAGNOSIS — Z95828 Presence of other vascular implants and grafts: Secondary | ICD-10-CM

## 2020-10-07 LAB — CBC WITH DIFFERENTIAL (CANCER CENTER ONLY)
Abs Immature Granulocytes: 0.01 10*3/uL (ref 0.00–0.07)
Basophils Absolute: 0 10*3/uL (ref 0.0–0.1)
Basophils Relative: 1 %
Eosinophils Absolute: 0 10*3/uL (ref 0.0–0.5)
Eosinophils Relative: 0 %
HCT: 27.2 % — ABNORMAL LOW (ref 36.0–46.0)
Hemoglobin: 8.4 g/dL — ABNORMAL LOW (ref 12.0–15.0)
Immature Granulocytes: 0 %
Lymphocytes Relative: 34 %
Lymphs Abs: 1.7 10*3/uL (ref 0.7–4.0)
MCH: 28.2 pg (ref 26.0–34.0)
MCHC: 30.9 g/dL (ref 30.0–36.0)
MCV: 91.3 fL (ref 80.0–100.0)
Monocytes Absolute: 0.5 10*3/uL (ref 0.1–1.0)
Monocytes Relative: 10 %
Neutro Abs: 2.7 10*3/uL (ref 1.7–7.7)
Neutrophils Relative %: 55 %
Platelet Count: 239 10*3/uL (ref 150–400)
RBC: 2.98 MIL/uL — ABNORMAL LOW (ref 3.87–5.11)
RDW: 18.1 % — ABNORMAL HIGH (ref 11.5–15.5)
WBC Count: 4.9 10*3/uL (ref 4.0–10.5)
nRBC: 0 % (ref 0.0–0.2)

## 2020-10-07 LAB — CMP (CANCER CENTER ONLY)
ALT: 9 U/L (ref 0–44)
AST: 10 U/L — ABNORMAL LOW (ref 15–41)
Albumin: 3.6 g/dL (ref 3.5–5.0)
Alkaline Phosphatase: 53 U/L (ref 38–126)
Anion gap: 8 (ref 5–15)
BUN: 9 mg/dL (ref 6–20)
CO2: 26 mmol/L (ref 22–32)
Calcium: 9 mg/dL (ref 8.9–10.3)
Chloride: 106 mmol/L (ref 98–111)
Creatinine: 0.8 mg/dL (ref 0.44–1.00)
GFR, Estimated: >60 mL/min (ref >60)
Glucose, Bld: 296 mg/dL — ABNORMAL HIGH (ref 70–99)
Potassium: 3.6 mmol/L (ref 3.5–5.1)
Sodium: 140 mmol/L (ref 135–145)
Total Bilirubin: 0.5 mg/dL (ref 0.3–1.2)
Total Protein: 6.5 g/dL (ref 6.5–8.1)

## 2020-10-07 MED ORDER — SODIUM CHLORIDE 0.9% FLUSH
10.0000 mL | Freq: Once | INTRAVENOUS | Status: AC
  Administered 2020-10-07: 10 mL
  Filled 2020-10-07: qty 10

## 2020-10-07 MED ORDER — ACETAMINOPHEN 325 MG PO TABS
ORAL_TABLET | ORAL | Status: AC
  Filled 2020-10-07: qty 2

## 2020-10-07 MED ORDER — SODIUM CHLORIDE 0.9 % IV SOLN
Freq: Once | INTRAVENOUS | Status: AC
  Filled 2020-10-07: qty 250

## 2020-10-07 MED ORDER — DIPHENHYDRAMINE HCL 25 MG PO CAPS
ORAL_CAPSULE | ORAL | Status: AC
  Filled 2020-10-07: qty 2

## 2020-10-07 MED ORDER — FAMOTIDINE IN NACL 20-0.9 MG/50ML-% IV SOLN
INTRAVENOUS | Status: AC
  Filled 2020-10-07: qty 50

## 2020-10-07 MED ORDER — PALONOSETRON HCL INJECTION 0.25 MG/5ML
INTRAVENOUS | Status: AC
  Filled 2020-10-07: qty 5

## 2020-10-07 MED ORDER — ACETAMINOPHEN 325 MG PO TABS
650.0000 mg | ORAL_TABLET | Freq: Once | ORAL | Status: AC
  Administered 2020-10-07: 650 mg via ORAL

## 2020-10-07 MED ORDER — SODIUM CHLORIDE 0.9 % IV SOLN
700.0000 mg | Freq: Once | INTRAVENOUS | Status: AC
  Administered 2020-10-07: 700 mg via INTRAVENOUS
  Filled 2020-10-07: qty 70

## 2020-10-07 MED ORDER — SODIUM CHLORIDE 0.9% FLUSH
10.0000 mL | INTRAVENOUS | Status: DC | PRN
  Administered 2020-10-07: 10 mL
  Filled 2020-10-07: qty 10

## 2020-10-07 MED ORDER — HEPARIN SOD (PORK) LOCK FLUSH 100 UNIT/ML IV SOLN
500.0000 [IU] | Freq: Once | INTRAVENOUS | Status: AC | PRN
  Administered 2020-10-07: 500 [IU]
  Filled 2020-10-07: qty 5

## 2020-10-07 MED ORDER — SODIUM CHLORIDE 0.9 % IV SOLN
6.0000 mg/kg | Freq: Once | INTRAVENOUS | Status: AC
Start: 2020-10-07 — End: 2020-10-07
  Administered 2020-10-07: 567 mg via INTRAVENOUS
  Filled 2020-10-07: qty 27

## 2020-10-07 MED ORDER — SODIUM CHLORIDE 0.9 % IV SOLN
10.0000 mg | Freq: Once | INTRAVENOUS | Status: AC
  Administered 2020-10-07: 10 mg via INTRAVENOUS
  Filled 2020-10-07: qty 10

## 2020-10-07 MED ORDER — DIPHENHYDRAMINE HCL 25 MG PO CAPS
50.0000 mg | ORAL_CAPSULE | Freq: Once | ORAL | Status: AC
  Administered 2020-10-07: 50 mg via ORAL

## 2020-10-07 MED ORDER — SODIUM CHLORIDE 0.9 % IV SOLN
420.0000 mg | Freq: Once | INTRAVENOUS | Status: AC
  Administered 2020-10-07: 420 mg via INTRAVENOUS
  Filled 2020-10-07: qty 14

## 2020-10-07 MED ORDER — SODIUM CHLORIDE 0.9 % IV SOLN
75.0000 mg/m2 | Freq: Once | INTRAVENOUS | Status: AC
  Administered 2020-10-07: 160 mg via INTRAVENOUS
  Filled 2020-10-07: qty 16

## 2020-10-07 MED ORDER — PALONOSETRON HCL INJECTION 0.25 MG/5ML
0.2500 mg | Freq: Once | INTRAVENOUS | Status: AC
  Administered 2020-10-07: 0.25 mg via INTRAVENOUS

## 2020-10-07 MED ORDER — FOSAPREPITANT DIMEGLUMINE INJECTION 150 MG
150.0000 mg | Freq: Once | INTRAVENOUS | Status: AC
  Administered 2020-10-07: 150 mg via INTRAVENOUS
  Filled 2020-10-07: qty 150

## 2020-10-07 NOTE — Progress Notes (Signed)
[place] Spiritual Care Note  Referred by Erin/RN in infusion for emotional/bereavement support because Robin Kennedy learned today that her son Robin Kennedy, eldest of five children, died unexpectedly. He was working and earning his masters degree, and Robin Kennedy was naturally shocked; this had previously been a much-anticipated day of celebrating her last chemo treatment. Provided pastoral presence and a bag of self-care items as a gesture of care and comfort. Ensured that she has my direct dial number in case it would be helpful to process further at any point and plan to follow up by phone as well.   [place], [place], [facility] Pager [phone] Voicemail [phone]

## 2020-10-07 NOTE — Patient Instructions (Addendum)
[facility] Discharge Instructions for Patients Receiving Chemotherapy  Happy Last Day of Treatment!   Today you received the following chemotherapy agents: Trastuzumab (Herceptin), Pertuzumab (Perjeta), Docetaxel (Taxotere), and Carboplatin  To help prevent nausea and vomiting after your treatment, we encourage you to take your nausea medication  as prescribed.   If you develop nausea and vomiting that is not controlled by your nausea medication, call the clinic.   BELOW ARE SYMPTOMS THAT SHOULD BE REPORTED IMMEDIATELY:  *FEVER GREATER THAN 100.5 F  *CHILLS WITH OR WITHOUT FEVER  NAUSEA AND VOMITING THAT IS NOT CONTROLLED WITH YOUR NAUSEA MEDICATION  *UNUSUAL SHORTNESS OF BREATH  *UNUSUAL BRUISING OR BLEEDING  TENDERNESS IN MOUTH AND THROAT WITH OR WITHOUT PRESENCE OF ULCERS  *URINARY PROBLEMS  *BOWEL PROBLEMS  UNUSUAL RASH Items with * indicate a potential emergency and should be followed up as soon as possible.  Feel free to call the clinic should you have any questions or concerns. The clinic phone number is [phone].  Please show the [place] at check-in to the Emergency Department and triage nurse.

## 2020-10-08 ENCOUNTER — Other Ambulatory Visit: Payer: Self-pay | Admitting: *Deleted

## 2020-10-08 ENCOUNTER — Ambulatory Visit
Admission: RE | Admit: 2020-10-08 | Discharge: 2020-10-08 | Disposition: A | Discharge status: Home / Self Care | Payer: 59 | Source: Ambulatory Visit | Attending: Hematology and Oncology | Admitting: Hematology and Oncology

## 2020-10-08 DIAGNOSIS — C50212 Malignant neoplasm of upper-inner quadrant of left female breast: Secondary | ICD-10-CM

## 2020-10-08 DIAGNOSIS — Z170 Estrogen receptor positive status [ER+]: Secondary | ICD-10-CM

## 2020-10-08 MED ORDER — GADOBUTROL 1 MMOL/ML IV SOLN
9.0000 mL | Freq: Once | INTRAVENOUS | Status: AC | PRN
  Administered 2020-10-08: 9 mL via INTRAVENOUS

## 2020-10-09 ENCOUNTER — Other Ambulatory Visit: Payer: Self-pay

## 2020-10-09 ENCOUNTER — Inpatient Hospital Stay: Payer: 59

## 2020-10-09 VITALS — BP 103/70 | HR 96 | Temp 98.2°F | Resp 18

## 2020-10-09 DIAGNOSIS — C50212 Malignant neoplasm of upper-inner quadrant of left female breast: Secondary | ICD-10-CM

## 2020-10-09 DIAGNOSIS — Z170 Estrogen receptor positive status [ER+]: Secondary | ICD-10-CM

## 2020-10-09 DIAGNOSIS — Z5112 Encounter for antineoplastic immunotherapy: Secondary | ICD-10-CM | POA: Diagnosis not present

## 2020-10-09 MED ORDER — PEGFILGRASTIM-JMDB 6 MG/0.6ML ~~LOC~~ SOSY
PREFILLED_SYRINGE | SUBCUTANEOUS | Status: AC
  Filled 2020-10-09: qty 0.6

## 2020-10-09 MED ORDER — PEGFILGRASTIM-JMDB 6 MG/0.6ML ~~LOC~~ SOSY
6.0000 mg | PREFILLED_SYRINGE | Freq: Once | SUBCUTANEOUS | Status: AC
  Administered 2020-10-09: 6 mg via SUBCUTANEOUS

## 2020-10-09 NOTE — Patient Instructions (Signed)
Pegfilgrastim injection What is this medicine? PEGFILGRASTIM (PEG fil gra stim) is a long-acting granulocyte colony-stimulating factor that stimulates the growth of neutrophils, a type of white blood cell important in the body's fight against infection. It is used to reduce the incidence of fever and infection in patients with certain types of cancer who are receiving chemotherapy that affects the bone marrow, and to increase survival after being exposed to high doses of radiation. This medicine may be used for other purposes; ask your health care provider or pharmacist if you have questions. COMMON BRAND NAME(S): Fulphila, Neulasta, Nyvepria, UDENYCA, Ziextenzo What should I tell my health care provider before I take this medicine? They need to know if you have any of these conditions:  kidney disease  latex allergy  ongoing radiation therapy  sickle cell disease  skin reactions to acrylic adhesives (On-Body Injector only)  an unusual or allergic reaction to pegfilgrastim, filgrastim, other medicines, foods, dyes, or preservatives  pregnant or trying to get pregnant  breast-feeding How should I use this medicine? This medicine is for injection under the skin. If you get this medicine at home, you will be taught how to prepare and give the pre-filled syringe or how to use the On-body Injector. Refer to the patient Instructions for Use for detailed instructions. Use exactly as directed. Tell your healthcare provider immediately if you suspect that the On-body Injector may not have performed as intended or if you suspect the use of the On-body Injector resulted in a missed or partial dose. It is important that you put your used needles and syringes in a special sharps container. Do not put them in a trash can. If you do not have a sharps container, call your pharmacist or healthcare provider to get one. Talk to your pediatrician regarding the use of this medicine in children. While this drug  may be prescribed for selected conditions, precautions do apply. Overdosage: If you think you have taken too much of this medicine contact a poison control center or emergency room at once. NOTE: This medicine is only for you. Do not share this medicine with others. What if I miss a dose? It is important not to miss your dose. Call your doctor or health care professional if you miss your dose. If you miss a dose due to an On-body Injector failure or leakage, a new dose should be administered as soon as possible using a single prefilled syringe for manual use. What may interact with this medicine? Interactions have not been studied. This list may not describe all possible interactions. Give your health care provider a list of all the medicines, herbs, non-prescription drugs, or dietary supplements you use. Also tell them if you smoke, drink alcohol, or use illegal drugs. Some items may interact with your medicine. What should I watch for while using this medicine? Your condition will be monitored carefully while you are receiving this medicine. You may need blood work done while you are taking this medicine. Talk to your health care provider about your risk of cancer. You may be more at risk for certain types of cancer if you take this medicine. If you are going to need a MRI, CT scan, or other procedure, tell your doctor that you are using this medicine (On-Body Injector only). What side effects may I notice from receiving this medicine? Side effects that you should report to your doctor or health care professional as soon as possible:  allergic reactions (skin rash, itching or hives, swelling of   the face, lips, or tongue)  back pain  dizziness  fever  pain, redness, or irritation at site where injected  pinpoint red spots on the skin  red or dark-brown urine  shortness of breath or breathing problems  stomach or side pain, or pain at the shoulder  swelling  tiredness  trouble  passing urine or change in the amount of urine  unusual bruising or bleeding Side effects that usually do not require medical attention (report to your doctor or health care professional if they continue or are bothersome):  bone pain  muscle pain This list may not describe all possible side effects. Call your doctor for medical advice about side effects. You may report side effects to FDA at 1-800-FDA-1088. Where should I keep my medicine? Keep out of the reach of children. If you are using this medicine at home, you will be instructed on how to store it. Throw away any unused medicine after the expiration date on the label. NOTE: This sheet is a summary. It may not cover all possible information. If you have questions about this medicine, talk to your doctor, pharmacist, or health care provider.  2021 Elsevier/Gold Standard (2019-08-23 13:20:51)  

## 2020-10-13 HISTORY — PX: BREAST BIOPSY: SHX20

## 2020-10-16 ENCOUNTER — Other Ambulatory Visit: Payer: Self-pay | Admitting: General Surgery

## 2020-10-16 ENCOUNTER — Other Ambulatory Visit: Payer: Self-pay

## 2020-10-16 ENCOUNTER — Encounter: Payer: Self-pay | Admitting: *Deleted

## 2020-10-16 ENCOUNTER — Encounter: Payer: Self-pay | Admitting: General Practice

## 2020-10-16 ENCOUNTER — Ambulatory Visit (HOSPITAL_COMMUNITY)
Admission: RE | Admit: 2020-10-16 | Discharge: 2020-10-16 | Disposition: A | Discharge status: Home / Self Care | Payer: 59 | Source: Ambulatory Visit | Attending: Hematology and Oncology | Admitting: Hematology and Oncology

## 2020-10-16 DIAGNOSIS — R928 Other abnormal and inconclusive findings on diagnostic imaging of breast: Secondary | ICD-10-CM

## 2020-10-16 DIAGNOSIS — C50212 Malignant neoplasm of upper-inner quadrant of left female breast: Secondary | ICD-10-CM | POA: Diagnosis not present

## 2020-10-16 DIAGNOSIS — Z170 Estrogen receptor positive status [ER+]: Secondary | ICD-10-CM | POA: Insufficient documentation

## 2020-10-16 MED ORDER — IOHEXOL 300 MG/ML  SOLN
100.0000 mL | Freq: Once | INTRAMUSCULAR | Status: AC | PRN
  Administered 2020-10-16: 100 mL via INTRAVENOUS

## 2020-10-16 NOTE — Progress Notes (Signed)
[facility] Spiritual Care Note  Robin Kennedy by phone for follow-up bereavement support. She reports that she and her family are coping the best they can and taking it one step at a time. She is aware of ongoing chaplain availability, should needs arise or circumstances change.   [place], [place], [facility] Pager [phone] Voicemail [phone]

## 2020-10-19 ENCOUNTER — Encounter: Payer: Self-pay | Admitting: *Deleted

## 2020-10-20 ENCOUNTER — Other Ambulatory Visit: Payer: Self-pay | Admitting: General Surgery

## 2020-10-20 ENCOUNTER — Encounter: Payer: Self-pay | Admitting: *Deleted

## 2020-10-20 DIAGNOSIS — R928 Other abnormal and inconclusive findings on diagnostic imaging of breast: Secondary | ICD-10-CM

## 2020-10-21 ENCOUNTER — Telehealth: Payer: Self-pay | Admitting: Hematology and Oncology

## 2020-10-21 NOTE — Telephone Encounter (Signed)
Scheduled appointment per 03/08 schedule message. Contacted patient, patient is aware.

## 2020-10-26 ENCOUNTER — Encounter: Payer: Self-pay | Admitting: *Deleted

## 2020-10-28 ENCOUNTER — Other Ambulatory Visit: Payer: Self-pay

## 2020-10-28 ENCOUNTER — Inpatient Hospital Stay: Payer: 59 | Attending: Hematology and Oncology

## 2020-10-28 ENCOUNTER — Other Ambulatory Visit: Payer: Self-pay | Admitting: *Deleted

## 2020-10-28 ENCOUNTER — Inpatient Hospital Stay: Payer: 59

## 2020-10-28 ENCOUNTER — Other Ambulatory Visit: Payer: Self-pay | Admitting: Hematology and Oncology

## 2020-10-28 VITALS — BP 178/80 | HR 67 | Temp 98.4°F | Resp 18 | Wt 189.8 lb

## 2020-10-28 DIAGNOSIS — E119 Type 2 diabetes mellitus without complications: Secondary | ICD-10-CM | POA: Diagnosis not present

## 2020-10-28 DIAGNOSIS — Z170 Estrogen receptor positive status [ER+]: Secondary | ICD-10-CM

## 2020-10-28 DIAGNOSIS — E876 Hypokalemia: Secondary | ICD-10-CM | POA: Diagnosis not present

## 2020-10-28 DIAGNOSIS — R530 Neoplastic (malignant) related fatigue: Secondary | ICD-10-CM | POA: Insufficient documentation

## 2020-10-28 DIAGNOSIS — D6481 Anemia due to antineoplastic chemotherapy: Secondary | ICD-10-CM | POA: Diagnosis not present

## 2020-10-28 DIAGNOSIS — C50212 Malignant neoplasm of upper-inner quadrant of left female breast: Secondary | ICD-10-CM

## 2020-10-28 DIAGNOSIS — R590 Localized enlarged lymph nodes: Secondary | ICD-10-CM | POA: Insufficient documentation

## 2020-10-28 DIAGNOSIS — Z5112 Encounter for antineoplastic immunotherapy: Secondary | ICD-10-CM | POA: Insufficient documentation

## 2020-10-28 DIAGNOSIS — Z79899 Other long term (current) drug therapy: Secondary | ICD-10-CM | POA: Insufficient documentation

## 2020-10-28 DIAGNOSIS — Z5111 Encounter for antineoplastic chemotherapy: Secondary | ICD-10-CM | POA: Diagnosis present

## 2020-10-28 LAB — CBC WITH DIFFERENTIAL (CANCER CENTER ONLY)
Abs Immature Granulocytes: 0.01 10*3/uL (ref 0.00–0.07)
Basophils Absolute: 0 10*3/uL (ref 0.0–0.1)
Basophils Relative: 1 %
Eosinophils Absolute: 0 10*3/uL (ref 0.0–0.5)
Eosinophils Relative: 0 %
HCT: 25.9 % — ABNORMAL LOW (ref 36.0–46.0)
Hemoglobin: 8.2 g/dL — ABNORMAL LOW (ref 12.0–15.0)
Immature Granulocytes: 0 %
Lymphocytes Relative: 36 %
Lymphs Abs: 1.8 10*3/uL (ref 0.7–4.0)
MCH: 29.7 pg (ref 26.0–34.0)
MCHC: 31.7 g/dL (ref 30.0–36.0)
MCV: 93.8 fL (ref 80.0–100.0)
Monocytes Absolute: 0.5 10*3/uL (ref 0.1–1.0)
Monocytes Relative: 11 %
Neutro Abs: 2.6 10*3/uL (ref 1.7–7.7)
Neutrophils Relative %: 52 %
Platelet Count: 201 10*3/uL (ref 150–400)
RBC: 2.76 MIL/uL — ABNORMAL LOW (ref 3.87–5.11)
RDW: 17.1 % — ABNORMAL HIGH (ref 11.5–15.5)
WBC Count: 5 10*3/uL (ref 4.0–10.5)
nRBC: 0 % (ref 0.0–0.2)

## 2020-10-28 LAB — CMP (CANCER CENTER ONLY)
ALT: 8 U/L (ref 0–44)
AST: 10 U/L — ABNORMAL LOW (ref 15–41)
Albumin: 3.4 g/dL — ABNORMAL LOW (ref 3.5–5.0)
Alkaline Phosphatase: 51 U/L (ref 38–126)
Anion gap: 8 (ref 5–15)
BUN: 9 mg/dL (ref 6–20)
CO2: 23 mmol/L (ref 22–32)
Calcium: 8.7 mg/dL — ABNORMAL LOW (ref 8.9–10.3)
Chloride: 108 mmol/L (ref 98–111)
Creatinine: 0.79 mg/dL (ref 0.44–1.00)
GFR, Estimated: >60 mL/min (ref >60)
Glucose, Bld: 216 mg/dL — ABNORMAL HIGH (ref 70–99)
Potassium: 3.2 mmol/L — ABNORMAL LOW (ref 3.5–5.1)
Sodium: 139 mmol/L (ref 135–145)
Total Bilirubin: 0.4 mg/dL (ref 0.3–1.2)
Total Protein: 6.2 g/dL — ABNORMAL LOW (ref 6.5–8.1)

## 2020-10-28 MED ORDER — ACETAMINOPHEN 325 MG PO TABS
650.0000 mg | ORAL_TABLET | Freq: Once | ORAL | Status: AC
  Administered 2020-10-28: 650 mg via ORAL

## 2020-10-28 MED ORDER — DIPHENHYDRAMINE HCL 25 MG PO CAPS
50.0000 mg | ORAL_CAPSULE | Freq: Once | ORAL | Status: AC
Start: 2020-10-28 — End: 2020-10-28
  Administered 2020-10-28: 50 mg via ORAL

## 2020-10-28 MED ORDER — SODIUM CHLORIDE 0.9 % IV SOLN
Freq: Once | INTRAVENOUS | Status: AC
  Filled 2020-10-28: qty 250

## 2020-10-28 MED ORDER — SODIUM CHLORIDE 0.9% FLUSH
10.0000 mL | INTRAVENOUS | Status: DC | PRN
  Administered 2020-10-28: 10 mL
  Filled 2020-10-28: qty 10

## 2020-10-28 MED ORDER — SODIUM CHLORIDE 0.9 % IV SOLN
420.0000 mg | Freq: Once | INTRAVENOUS | Status: AC
  Administered 2020-10-28: 420 mg via INTRAVENOUS
  Filled 2020-10-28: qty 14

## 2020-10-28 MED ORDER — DIPHENHYDRAMINE HCL 25 MG PO CAPS
ORAL_CAPSULE | ORAL | Status: AC
  Filled 2020-10-28: qty 1

## 2020-10-28 MED ORDER — HEPARIN SOD (PORK) LOCK FLUSH 100 UNIT/ML IV SOLN
500.0000 [IU] | Freq: Once | INTRAVENOUS | Status: AC | PRN
  Administered 2020-10-28: 500 [IU]
  Filled 2020-10-28: qty 5

## 2020-10-28 MED ORDER — ACETAMINOPHEN 325 MG PO TABS
ORAL_TABLET | ORAL | Status: AC
  Filled 2020-10-28: qty 2

## 2020-10-28 MED ORDER — LIDOCAINE-PRILOCAINE 2.5-2.5 % EX CREA: 1.0000 "application " | TOPICAL_CREAM | CUTANEOUS | 1 refills | Status: DC | PRN

## 2020-10-28 MED ORDER — TRASTUZUMAB-DKST CHEMO 150 MG IV SOLR
6.0000 mg/kg | Freq: Once | INTRAVENOUS | Status: AC
  Administered 2020-10-28: 525 mg via INTRAVENOUS
  Filled 2020-10-28: qty 25

## 2020-10-28 NOTE — Patient Instructions (Signed)
[facility] Discharge Instructions for Patients Receiving Chemotherapy  Today you received the following chemotherapy agents herceptin, perjeta   To help prevent nausea and vomiting after your treatment, we encourage you to take your nausea medication as directed.   If you develop nausea and vomiting that is not controlled by your nausea medication, call the clinic.   BELOW ARE SYMPTOMS THAT SHOULD BE REPORTED IMMEDIATELY:  *FEVER GREATER THAN 100.5 F  *CHILLS WITH OR WITHOUT FEVER  NAUSEA AND VOMITING THAT IS NOT CONTROLLED WITH YOUR NAUSEA MEDICATION  *UNUSUAL SHORTNESS OF BREATH  *UNUSUAL BRUISING OR BLEEDING  TENDERNESS IN MOUTH AND THROAT WITH OR WITHOUT PRESENCE OF ULCERS  *URINARY PROBLEMS  *BOWEL PROBLEMS  UNUSUAL RASH Items with * indicate a potential emergency and should be followed up as soon as possible.  Feel free to call the clinic should you have any questions or concerns. The clinic phone number is [phone].  Please show the CHEMO ALERT CARD at check-in to the Emergency Department and triage nurse.   

## 2020-11-02 ENCOUNTER — Other Ambulatory Visit: Payer: Self-pay

## 2020-11-02 ENCOUNTER — Encounter (HOSPITAL_BASED_OUTPATIENT_CLINIC_OR_DEPARTMENT_OTHER): Payer: Self-pay | Admitting: General Surgery

## 2020-11-04 MED ORDER — ENSURE PRE-SURGERY PO LIQD: 296.0000 mL | Freq: Once | ORAL | Status: DC

## 2020-11-04 MED ORDER — CHLORHEXIDINE GLUCONATE CLOTH 2 % EX PADS: 6.0000 | MEDICATED_PAD | Freq: Once | CUTANEOUS | Status: DC

## 2020-11-04 NOTE — Progress Notes (Signed)
      Enhanced Recovery after Surgery for Orthopedics Enhanced Recovery after Surgery is a protocol used to improve the stress on your body and your recovery after surgery.  Patient Instructions  . The night before surgery:  o No food after midnight. ONLY clear liquids after midnight  . The day of surgery (if you do NOT have diabetes):  o Drink ONE (1) Pre-Surgery Clear Ensure as directed.   o This drink was given to you during your hospital  pre-op appointment visit. o The pre-op nurse will instruct you on the time to drink the  Pre-Surgery Ensure depending on your surgery time. o Finish the drink at the designated time by the pre-op nurse.  o Nothing else to drink after completing the  Pre-Surgery Clear Ensure.  . The day of surgery (if you have diabetes): o Drink ONE (1) Gatorade 2 (G2) as directed. o This drink was given to you during your hospital  pre-op appointment visit.  o The pre-op nurse will instruct you on the time to drink the   Gatorade 2 (G2) depending on your surgery time. o Color of the Gatorade may vary. Red is not allowed. o Nothing else to drink after completing the  Gatorade 2 (G2).         If you have questions, please contact your surgeon's office. Surgical soap given to patient with instructions. Pt verbalizes understanding.

## 2020-11-04 NOTE — H&P (Signed)
Robin Kennedy Appointment: 10/16/2020 9:45 AM Location: [facility] Patient #: [id number] DOB: 08-11-1976 Married / Language: English / Race: Black or African American Female   History of Present Illness Robin Klein MD; 10/16/2020 10:46 AM) The patient is a 45 year old female who presents for a follow-up for Breast cancer. Pt is a 45 yo F referred by Dr. Autumn Kennedy for a new diagnosis of left breast cancer 05/2020. The patient presented with a palpable left breast pain and was found to have a palpable left breast mass. Diagnostic imaging was performed and it showed a 3.5 cm mass in the upper inner quadrant with three enlarged lymph nodes. 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 Ki 67 was 60%. She had a normal mammogram 6 months ago on that side and had some right sided calcs that appeared stable for 6 months. She had no other known family history of cancer. She had menarche at age 83, menopause with hysterectomy. She is a G4P3 with first child age 44.   Patient was found to have at least 9 abnormal lymph nodes and multiple sites of cancer on the left. She was diagnosed with inflammatory cancer. She has been receiving neoadjuvant chemotherapy. She also has had right sided breast calcifications that are too superficial to biopsy. These have been recommended to come out. She has had a good response to chemo. Genetic testing was negative.   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 Patient status post neoadjuvant chemotherapy. Additionally, patient has a small group of calcifications within the right breast for which excision at the time of surgery has been recommended. An attempt was made to biopsy these with stereotactic guidance 09/24/2019.  EXAM: BILATERAL BREAST MRI WITH AND WITHOUT CONTRAST  TECHNIQUE: Multiplanar,  multisequence MR images of both breasts were obtained prior to and following the intravenous administration of 9 ml of Gadavist  Three-dimensional MR images were rendered by post-processing of the original MR data on an independent workstation. The three-dimensional MR images were interpreted, and findings are reported in the following complete MRI report for this study. Three dimensional images were evaluated at the independent interpreting workstation using the DynaCAD thin client.  COMPARISON: Previous exam(s).  FINDINGS: Breast composition: b. Scattered fibroglandular tissue.  Background parenchymal enhancement: Mild  Right breast: No mass or abnormal enhancement. Susceptibility artifact from biopsy marking clip within the right breast.  Left breast: There is improved but persistent skin thickening overlying the left breast with associated enhancement. There is mild to moderate background parenchymal and trabecular enhancement. Interval resolution of previously described masses throughout the left breast.  Lymph nodes: Significant interval decrease in size of previously described left axillary and retropectoral adenopathy. Largest visualized left axillary lymph node measures 10 mm (image 48; series 6). The remaining visualized left axillary lymph nodes are subcentimeter in overall size however are round and slightly abnormal in morphologic appearance. No definitive residual retropectoral lymph nodes are identified.  Ancillary findings: None.  IMPRESSION: Findings compatible with interval treatment response. Near complete resolution of previously visualized enhancing masses throughout the left breast. There is improved but residual skin thickening involving the left breast with associated enhancement.  On prior diagnostic mammography there is an indeterminate group of calcifications within the right breast.  RECOMMENDATION: Treatment plan for known left breast  malignancy and left axillary metastatic adenopathy.  right dx mammogram 10/05/20 RECOMMENDATION:  1. Slight interval coarsening of a 5 mm group of pleomorphic calcifications in the upper central right breast. Biopsy of these was previously attempted but they were in accessible due to location. These remain somewhat suspicious. Recommend localization of the calcifications with excision at the time of the patient's left breast surgery.   Allergies Robin Forehand, CNA; 10/16/2020 10:08 AM) No Known Drug Allergies  [10/16/2020]: Allergies Reconciled   Medication History Robin Forehand, CNA; 10/16/2020 10:08 AM) Amaryl (4MG  Tablet, Oral) Active. Janumet XR (50-1000MG  Tablet ER 24HR, Oral) Active. metFORMIN HCl ER (500MG  Tablet ER 24HR, Oral) Active. Integra F (125-1MG  Capsule, Oral) Active. Ibuprofen (600MG  Tablet, Oral) Active. Medications Reconciled    Review of Systems Robin Klein MD; 10/16/2020 10:46 AM) All other systems negative   Physical Exam Robin Klein MD; 10/16/2020 10:47 AM) General Mental Status-Alert. General Appearance-Consistent with stated age. Hydration-Well hydrated. Voice-Normal.  Head and Neck Head-normocephalic, atraumatic with no lesions or palpable masses.  Eye Sclera/Conjunctiva - Bilateral-No scleral icterus.  Chest and Lung Exam Chest and lung exam reveals -quiet, even and easy respiratory effort with no use of accessory muscles. Inspection Chest Wall - Normal. Back - normal.  Breast Note: no residual palpable mass on left. Still some questionable palpable adenopathy on left. no palpable mass on right. no nipple retraction. left breast swelling improved.   Cardiovascular Cardiovascular examination reveals -normal pedal pulses bilaterally. Note: regular rate and rhythm  Abdomen Inspection-Inspection Normal. Palpation/Percussion Palpation and Percussion of the abdomen reveal - Soft, Non Tender, No Rebound  tenderness, No Rigidity (guarding) and No hepatosplenomegaly.  Peripheral Vascular Upper Extremity Inspection - Bilateral - Normal - No Clubbing, No Cyanosis, No Edema, Pulses Intact. Lower Extremity Palpation - Edema - Bilateral - No edema - Bilateral.  Neurologic Neurologic evaluation reveals -alert and oriented x 3 with no impairment of recent or remote memory. Mental Status-Normal.  Musculoskeletal Global Assessment -Note: no gross deformities.  Normal Exam - Left-Upper Extremity Strength Normal and Lower Extremity Strength Normal. Normal Exam - Right-Upper Extremity Strength Normal and Lower Extremity Strength Normal.  Lymphatic Head & Neck  General Head & Neck Lymphatics: Bilateral - Description - Normal. Axillary  General Axillary Region: Bilateral - Description - Normal. Tenderness - Non Tender.    Assessment & Plan Robin Klein MD; 10/16/2020 10:50 AM) MALIGNANT NEOPLASM OF OVERLAPPING SITES OF LEFT BREAST IN FEMALE, ESTROGEN RECEPTOR POSITIVE (C50.812) Impression: Will plan left MRM given multiple tumors on left and inflammatory cancer. She had 9 nodes on original MR, so will do ALND. I would not recommend attempting to do immediate reconstruction.  I discussed surgery wtih the patient including anchor type incision, risk of bleeding, infection, wound breakdown, possible need to return to the OR, possible transfusion, possible prolonged pain, nerve damage and muscle weakness, possible decreased range of motion.  I discussed post op recovery, drains, and multimodal pain control. CARCINOMA OF LEFT BREAST METASTATIC TO AXILLARY LYMPH NODE (C50.912) Impression: see above. ABNORMAL MAMMOGRAM OF RIGHT BREAST (R92.8) Impression: Will plan seed localized excisional biopsy on this side. Current Plans Pt Education - [facility] Mastectomy [facility]   Signed electronically by Robin Klein, MD (10/16/2020 10:50 AM)

## 2020-11-05 ENCOUNTER — Other Ambulatory Visit (HOSPITAL_COMMUNITY)
Admission: RE | Admit: 2020-11-05 | Discharge: 2020-11-05 | Disposition: A | Discharge status: Home / Self Care | Payer: 59 | Source: Ambulatory Visit | Attending: General Surgery | Admitting: General Surgery

## 2020-11-05 DIAGNOSIS — Z01812 Encounter for preprocedural laboratory examination: Secondary | ICD-10-CM | POA: Insufficient documentation

## 2020-11-05 DIAGNOSIS — Z20822 Contact with and (suspected) exposure to covid-19: Secondary | ICD-10-CM | POA: Diagnosis not present

## 2020-11-05 LAB — SARS CORONAVIRUS 2 (TAT 6-24 HRS): SARS Coronavirus 2: NEGATIVE

## 2020-11-06 ENCOUNTER — Other Ambulatory Visit: Payer: Self-pay

## 2020-11-06 ENCOUNTER — Ambulatory Visit
Admission: RE | Admit: 2020-11-06 | Discharge: 2020-11-06 | Disposition: A | Discharge status: Home / Self Care | Payer: 59 | Source: Ambulatory Visit | Attending: General Surgery | Admitting: General Surgery

## 2020-11-06 DIAGNOSIS — R928 Other abnormal and inconclusive findings on diagnostic imaging of breast: Secondary | ICD-10-CM

## 2020-11-09 ENCOUNTER — Encounter (HOSPITAL_BASED_OUTPATIENT_CLINIC_OR_DEPARTMENT_OTHER)
Admission: RE | Disposition: A | Discharge status: Home / Self Care | Payer: Self-pay | Source: Home / Self Care | Attending: General Surgery

## 2020-11-09 ENCOUNTER — Ambulatory Visit (HOSPITAL_BASED_OUTPATIENT_CLINIC_OR_DEPARTMENT_OTHER): Payer: 59 | Admitting: Certified Registered"

## 2020-11-09 ENCOUNTER — Other Ambulatory Visit: Payer: Self-pay

## 2020-11-09 ENCOUNTER — Ambulatory Visit
Admission: RE | Admit: 2020-11-09 | Discharge: 2020-11-09 | Disposition: A | Discharge status: Home / Self Care | Payer: 59 | Source: Ambulatory Visit | Attending: General Surgery | Admitting: General Surgery

## 2020-11-09 ENCOUNTER — Encounter (HOSPITAL_BASED_OUTPATIENT_CLINIC_OR_DEPARTMENT_OTHER): Payer: Self-pay | Admitting: General Surgery

## 2020-11-09 ENCOUNTER — Ambulatory Visit (HOSPITAL_BASED_OUTPATIENT_CLINIC_OR_DEPARTMENT_OTHER)
Admission: RE | Admit: 2020-11-09 | Discharge: 2020-11-10 | Disposition: A | Discharge status: Home / Self Care | Payer: 59 | Attending: General Surgery | Admitting: General Surgery

## 2020-11-09 DIAGNOSIS — Z170 Estrogen receptor positive status [ER+]: Secondary | ICD-10-CM | POA: Insufficient documentation

## 2020-11-09 DIAGNOSIS — N6022 Fibroadenosis of left breast: Secondary | ICD-10-CM | POA: Diagnosis not present

## 2020-11-09 DIAGNOSIS — Z9221 Personal history of antineoplastic chemotherapy: Secondary | ICD-10-CM | POA: Insufficient documentation

## 2020-11-09 DIAGNOSIS — C773 Secondary and unspecified malignant neoplasm of axilla and upper limb lymph nodes: Secondary | ICD-10-CM | POA: Diagnosis not present

## 2020-11-09 DIAGNOSIS — C50912 Malignant neoplasm of unspecified site of left female breast: Secondary | ICD-10-CM | POA: Diagnosis present

## 2020-11-09 DIAGNOSIS — C50812 Malignant neoplasm of overlapping sites of left female breast: Secondary | ICD-10-CM | POA: Insufficient documentation

## 2020-11-09 DIAGNOSIS — R928 Other abnormal and inconclusive findings on diagnostic imaging of breast: Secondary | ICD-10-CM

## 2020-11-09 HISTORY — DX: Secondary and unspecified malignant neoplasm of axilla and upper limb lymph nodes: C77.3

## 2020-11-09 HISTORY — PX: RADIOACTIVE SEED GUIDED EXCISIONAL BREAST BIOPSY: SHX6490

## 2020-11-09 HISTORY — DX: Malignant neoplasm of unspecified site of left female breast: C50.912

## 2020-11-09 HISTORY — PX: MODIFIED MASTECTOMY: SHX5268

## 2020-11-09 LAB — GLUCOSE, CAPILLARY
Glucose-Capillary: 179 mg/dL — ABNORMAL HIGH (ref 70–99)
Glucose-Capillary: 217 mg/dL — ABNORMAL HIGH (ref 70–99)
Glucose-Capillary: 289 mg/dL — ABNORMAL HIGH (ref 70–99)

## 2020-11-09 SURGERY — MODIFIED MASTECTOMY
Anesthesia: General | Site: Breast | Laterality: Right

## 2020-11-09 MED ORDER — PROCHLORPERAZINE EDISYLATE 10 MG/2ML IJ SOLN: 5.0000 mg | Freq: Four times a day (QID) | INTRAMUSCULAR | Status: DC | PRN

## 2020-11-09 MED ORDER — DEXAMETHASONE SODIUM PHOSPHATE 4 MG/ML IJ SOLN
INTRAMUSCULAR | Status: DC | PRN
  Administered 2020-11-09: 4 mg
  Administered 2020-11-09: 5 mg

## 2020-11-09 MED ORDER — ONDANSETRON HCL 4 MG/2ML IJ SOLN: 4.0000 mg | Freq: Four times a day (QID) | INTRAMUSCULAR | Status: DC | PRN

## 2020-11-09 MED ORDER — FENTANYL CITRATE (PF) 100 MCG/2ML IJ SOLN
INTRAMUSCULAR | Status: AC
  Filled 2020-11-09: qty 2

## 2020-11-09 MED ORDER — CEFAZOLIN SODIUM-DEXTROSE 2-4 GM/100ML-% IV SOLN
2.0000 g | INTRAVENOUS | Status: AC
  Administered 2020-11-09: 2 g via INTRAVENOUS

## 2020-11-09 MED ORDER — PROCHLORPERAZINE MALEATE 10 MG PO TABS
10.0000 mg | ORAL_TABLET | Freq: Four times a day (QID) | ORAL | Status: DC | PRN
  Filled 2020-11-09: qty 1

## 2020-11-09 MED ORDER — 0.9 % SODIUM CHLORIDE (POUR BTL) OPTIME
TOPICAL | Status: DC | PRN
  Administered 2020-11-09: 300 mL

## 2020-11-09 MED ORDER — ACETAMINOPHEN 500 MG PO TABS
1000.0000 mg | ORAL_TABLET | ORAL | Status: AC
  Administered 2020-11-09: 1000 mg via ORAL

## 2020-11-09 MED ORDER — LIDOCAINE HCL (PF) 1 % IJ SOLN
INTRAMUSCULAR | Status: AC
  Filled 2020-11-09: qty 30

## 2020-11-09 MED ORDER — PROPOFOL 500 MG/50ML IV EMUL
INTRAVENOUS | Status: DC | PRN
  Administered 2020-11-09: 25 ug/kg/min via INTRAVENOUS

## 2020-11-09 MED ORDER — FENTANYL CITRATE (PF) 100 MCG/2ML IJ SOLN
INTRAMUSCULAR | Status: DC | PRN
  Administered 2020-11-09 (×3): 50 ug via INTRAVENOUS
  Administered 2020-11-09: 25 ug via INTRAVENOUS

## 2020-11-09 MED ORDER — OXYCODONE HCL 5 MG PO TABS: 5.0000 mg | ORAL_TABLET | Freq: Four times a day (QID) | ORAL | 0 refills | Status: DC | PRN

## 2020-11-09 MED ORDER — METHOCARBAMOL 500 MG PO TABS
500.0000 mg | ORAL_TABLET | Freq: Four times a day (QID) | ORAL | Status: DC | PRN
  Administered 2020-11-09: 500 mg via ORAL
  Filled 2020-11-09: qty 1

## 2020-11-09 MED ORDER — OXYCODONE HCL 5 MG PO TABS
5.0000 mg | ORAL_TABLET | Freq: Four times a day (QID) | ORAL | Status: DC | PRN
  Administered 2020-11-09: 5 mg via ORAL
  Filled 2020-11-09: qty 1

## 2020-11-09 MED ORDER — PHENYLEPHRINE 40 MCG/ML (10ML) SYRINGE FOR IV PUSH (FOR BLOOD PRESSURE SUPPORT)
PREFILLED_SYRINGE | INTRAVENOUS | Status: AC
  Filled 2020-11-09: qty 30

## 2020-11-09 MED ORDER — BUPIVACAINE-EPINEPHRINE (PF) 0.25% -1:200000 IJ SOLN
INTRAMUSCULAR | Status: AC
  Filled 2020-11-09: qty 30

## 2020-11-09 MED ORDER — ONDANSETRON 4 MG PO TBDP: 4.0000 mg | ORAL_TABLET | Freq: Four times a day (QID) | ORAL | Status: DC | PRN

## 2020-11-09 MED ORDER — PROPOFOL 10 MG/ML IV BOLUS
INTRAVENOUS | Status: AC
  Filled 2020-11-09: qty 20

## 2020-11-09 MED ORDER — BUPIVACAINE-EPINEPHRINE (PF) 0.5% -1:200000 IJ SOLN
INTRAMUSCULAR | Status: DC | PRN
  Administered 2020-11-09: 30 mL

## 2020-11-09 MED ORDER — MIDAZOLAM HCL 2 MG/2ML IJ SOLN
INTRAMUSCULAR | Status: AC
  Filled 2020-11-09: qty 2

## 2020-11-09 MED ORDER — ACETAMINOPHEN 500 MG PO TABS
ORAL_TABLET | ORAL | Status: AC
  Filled 2020-11-09: qty 2

## 2020-11-09 MED ORDER — CEFAZOLIN SODIUM-DEXTROSE 2-4 GM/100ML-% IV SOLN: 2.0000 g | Freq: Three times a day (TID) | INTRAVENOUS | Status: AC

## 2020-11-09 MED ORDER — PROPOFOL 10 MG/ML IV BOLUS
INTRAVENOUS | Status: DC | PRN
  Administered 2020-11-09: 50 mg via INTRAVENOUS

## 2020-11-09 MED ORDER — MIDAZOLAM HCL 5 MG/5ML IJ SOLN
INTRAMUSCULAR | Status: DC | PRN
  Administered 2020-11-09: 1 mg via INTRAVENOUS

## 2020-11-09 MED ORDER — MELATONIN 3 MG PO TABS
3.0000 mg | ORAL_TABLET | Freq: Every evening | ORAL | Status: DC | PRN
  Filled 2020-11-09: qty 1

## 2020-11-09 MED ORDER — FENTANYL CITRATE (PF) 100 MCG/2ML IJ SOLN
100.0000 ug | Freq: Once | INTRAMUSCULAR | Status: AC
Start: 2020-11-09 — End: 2020-11-09
  Administered 2020-11-09: 50 ug via INTRAVENOUS

## 2020-11-09 MED ORDER — DIPHENHYDRAMINE HCL 50 MG/ML IJ SOLN: 12.5000 mg | Freq: Four times a day (QID) | INTRAMUSCULAR | Status: DC | PRN

## 2020-11-09 MED ORDER — METHOCARBAMOL 500 MG PO TABS: 500.0000 mg | ORAL_TABLET | Freq: Four times a day (QID) | ORAL | 1 refills | Status: DC | PRN

## 2020-11-09 MED ORDER — DIPHENHYDRAMINE HCL 50 MG/ML IJ SOLN
INTRAMUSCULAR | Status: DC | PRN
  Administered 2020-11-09: 6 mg via INTRAVENOUS

## 2020-11-09 MED ORDER — LIDOCAINE HCL 1 % IJ SOLN
INTRAMUSCULAR | Status: DC | PRN
  Administered 2020-11-09: 20 mL via SUBCUTANEOUS

## 2020-11-09 MED ORDER — MORPHINE SULFATE (PF) 4 MG/ML IV SOLN: 1.0000 mg | INTRAVENOUS | Status: DC | PRN

## 2020-11-09 MED ORDER — ACETAMINOPHEN 500 MG PO TABS
1000.0000 mg | ORAL_TABLET | Freq: Four times a day (QID) | ORAL | Status: DC
  Administered 2020-11-09 – 2020-11-10 (×3): 1000 mg via ORAL
  Filled 2020-11-09 (×3): qty 2

## 2020-11-09 MED ORDER — LACTATED RINGERS IV SOLN: INTRAVENOUS | Status: DC

## 2020-11-09 MED ORDER — KCL IN DEXTROSE-NACL 20-5-0.45 MEQ/L-%-% IV SOLN: INTRAVENOUS | Status: AC

## 2020-11-09 MED ORDER — METFORMIN HCL ER 500 MG PO TB24
1000.0000 mg | ORAL_TABLET | Freq: Two times a day (BID) | ORAL | Status: DC
  Administered 2020-11-09: 1000 mg via ORAL
  Filled 2020-11-09: qty 2

## 2020-11-09 MED ORDER — CLONIDINE HCL (ANALGESIA) 100 MCG/ML EP SOLN
EPIDURAL | Status: DC | PRN
  Administered 2020-11-09: 80 ug

## 2020-11-09 MED ORDER — ONDANSETRON HCL 4 MG/2ML IJ SOLN
INTRAMUSCULAR | Status: DC | PRN
  Administered 2020-11-09: 4 mg via INTRAVENOUS

## 2020-11-09 MED ORDER — LIDOCAINE 2% (20 MG/ML) 5 ML SYRINGE
INTRAMUSCULAR | Status: DC | PRN
  Administered 2020-11-09: 30 mg via INTRAVENOUS

## 2020-11-09 MED ORDER — TRAMADOL HCL 50 MG PO TABS
50.0000 mg | ORAL_TABLET | Freq: Four times a day (QID) | ORAL | Status: DC | PRN
  Administered 2020-11-09: 50 mg via ORAL
  Filled 2020-11-09: qty 1

## 2020-11-09 MED ORDER — CEFAZOLIN SODIUM-DEXTROSE 2-4 GM/100ML-% IV SOLN
INTRAVENOUS | Status: AC
  Filled 2020-11-09: qty 100

## 2020-11-09 MED ORDER — POTASSIUM CHLORIDE CRYS ER 20 MEQ PO TBCR
20.0000 meq | EXTENDED_RELEASE_TABLET | Freq: Every day | ORAL | Status: DC
  Administered 2020-11-09: 20 meq via ORAL
  Filled 2020-11-09: qty 1

## 2020-11-09 MED ORDER — MIDAZOLAM HCL 2 MG/2ML IJ SOLN
2.0000 mg | Freq: Once | INTRAMUSCULAR | Status: AC
  Administered 2020-11-09: 2 mg via INTRAVENOUS

## 2020-11-09 MED ORDER — DIPHENHYDRAMINE HCL 12.5 MG/5ML PO ELIX: 12.5000 mg | ORAL_SOLUTION | Freq: Four times a day (QID) | ORAL | Status: DC | PRN

## 2020-11-09 MED ORDER — INSULIN DEGLUDEC 100 UNIT/ML ~~LOC~~ SOPN: 10.0000 [IU] | PEN_INJECTOR | Freq: Every day | SUBCUTANEOUS | Status: DC

## 2020-11-09 SURGICAL SUPPLY — 83 items
ADH SKN CLS APL DERMABOND .7 (GAUZE/BANDAGES/DRESSINGS) ×6
APL PRP STRL LF DISP 70% ISPRP (MISCELLANEOUS) ×4
BAG DECANTER FOR FLEXI CONT (MISCELLANEOUS) ×2 IMPLANT
BINDER BREAST LRG (GAUZE/BANDAGES/DRESSINGS) IMPLANT
BINDER BREAST MEDIUM (GAUZE/BANDAGES/DRESSINGS) IMPLANT
BINDER BREAST XLRG (GAUZE/BANDAGES/DRESSINGS) IMPLANT
BINDER BREAST XXLRG (GAUZE/BANDAGES/DRESSINGS) ×1 IMPLANT
BIOPATCH RED 1 DISK 7.0 (GAUZE/BANDAGES/DRESSINGS) ×2 IMPLANT
BLADE HEX COATED 2.75 (ELECTRODE) ×3 IMPLANT
BLADE SURG 10 STRL SS (BLADE) ×3 IMPLANT
BLADE SURG 15 STRL LF DISP TIS (BLADE) IMPLANT
BLADE SURG 15 STRL SS (BLADE)
BNDG COHESIVE 4X5 TAN STRL (GAUZE/BANDAGES/DRESSINGS) ×3 IMPLANT
CANISTER SUC SOCK COL 7IN (MISCELLANEOUS) IMPLANT
CANISTER SUCT 1200ML W/VALVE (MISCELLANEOUS) ×3 IMPLANT
CHLORAPREP W/TINT 26 (MISCELLANEOUS) ×4 IMPLANT
CLIP VESOCCLUDE LG 6/CT (CLIP) ×3 IMPLANT
CLIP VESOCCLUDE MED 6/CT (CLIP) ×6 IMPLANT
CLIP VESOCCLUDE SM WIDE 6/CT (CLIP) IMPLANT
COVER BACK TABLE 60X90IN (DRAPES) ×3 IMPLANT
COVER MAYO STAND STRL (DRAPES) ×3 IMPLANT
COVER PROBE W GEL 5X96 (DRAPES) ×3 IMPLANT
COVER WAND RF STERILE (DRAPES) IMPLANT
DECANTER SPIKE VIAL GLASS SM (MISCELLANEOUS) IMPLANT
DERMABOND ADVANCED (GAUZE/BANDAGES/DRESSINGS) ×3
DERMABOND ADVANCED .7 DNX12 (GAUZE/BANDAGES/DRESSINGS) ×2 IMPLANT
DEVICE DISSECT PLASMABLAD 3.0S (MISCELLANEOUS) ×2 IMPLANT
DRAIN CHANNEL 19F RND (DRAIN) ×4 IMPLANT
DRAPE LAPAROSCOPIC ABDOMINAL (DRAPES) ×3 IMPLANT
DRAPE UTILITY XL STRL (DRAPES) ×3 IMPLANT
DRSG TEGADERM 4X4.75 (GAUZE/BANDAGES/DRESSINGS) ×1 IMPLANT
ELECT BLADE 4.0 EZ CLEAN MEGAD (MISCELLANEOUS) ×3
ELECT COATED BLADE 2.86 ST (ELECTRODE) ×3 IMPLANT
ELECT REM PT RETURN 9FT ADLT (ELECTROSURGICAL) ×3
ELECTRODE BLDE 4.0 EZ CLN MEGD (MISCELLANEOUS) IMPLANT
ELECTRODE REM PT RTRN 9FT ADLT (ELECTROSURGICAL) ×2 IMPLANT
EVACUATOR SILICONE 100CC (DRAIN) ×4 IMPLANT
GAUZE SPONGE 4X4 12PLY STRL LF (GAUZE/BANDAGES/DRESSINGS) ×3 IMPLANT
GLOVE SURG ENC MOIS LTX SZ6 (GLOVE) ×3 IMPLANT
GLOVE SURG LTX SZ6.5 (GLOVE) ×1 IMPLANT
GLOVE SURG UNDER POLY LF SZ6.5 (GLOVE) ×6 IMPLANT
GLOVE SURG UNDER POLY LF SZ7 (GLOVE) ×3 IMPLANT
GOWN STRL REUS W/ TWL LRG LVL3 (GOWN DISPOSABLE) ×2 IMPLANT
GOWN STRL REUS W/ TWL XL LVL3 (GOWN DISPOSABLE) IMPLANT
GOWN STRL REUS W/TWL 2XL LVL3 (GOWN DISPOSABLE) ×4 IMPLANT
GOWN STRL REUS W/TWL LRG LVL3 (GOWN DISPOSABLE) ×6
GOWN STRL REUS W/TWL XL LVL3 (GOWN DISPOSABLE) ×9
KIT MARKER MARGIN INK (KITS) ×3 IMPLANT
LIGHT WAVEGUIDE WIDE FLAT (MISCELLANEOUS) ×1 IMPLANT
NDL HYPO 25X1 1.5 SAFETY (NEEDLE) ×2 IMPLANT
NDL SPNL 22GX3.5 QUINCKE BK (NEEDLE) ×2 IMPLANT
NEEDLE HYPO 25X1 1.5 SAFETY (NEEDLE) ×3 IMPLANT
NEEDLE SPNL 22GX3.5 QUINCKE BK (NEEDLE) IMPLANT
NS IRRIG 1000ML POUR BTL (IV SOLUTION) ×3 IMPLANT
PACK BASIN DAY SURGERY FS (CUSTOM PROCEDURE TRAY) ×3 IMPLANT
PACK UNIVERSAL I (CUSTOM PROCEDURE TRAY) ×3 IMPLANT
PENCIL SMOKE EVACUATOR (MISCELLANEOUS) ×3 IMPLANT
PIN SAFETY STERILE (MISCELLANEOUS) ×3 IMPLANT
PLASMABLADE 3.0S (MISCELLANEOUS)
SLEEVE SCD COMPRESS KNEE MED (STOCKING) ×3 IMPLANT
SPONGE LAP 18X18 RF (DISPOSABLE) ×6 IMPLANT
STAPLER VISISTAT 35W (STAPLE) ×2 IMPLANT
STOCKINETTE IMPERVIOUS LG (DRAPES) ×3 IMPLANT
STRIP CLOSURE SKIN 1/2X4 (GAUZE/BANDAGES/DRESSINGS) ×5 IMPLANT
SUT ETHILON 2 0 FS 18 (SUTURE) ×4 IMPLANT
SUT MNCRL AB 4-0 PS2 18 (SUTURE) ×11 IMPLANT
SUT MON AB 4-0 PC3 18 (SUTURE) ×2 IMPLANT
SUT SILK 0 TIES 10X30 (SUTURE) ×3 IMPLANT
SUT SILK 2 0 SH (SUTURE) ×1 IMPLANT
SUT VIC AB 2-0 SH 18 (SUTURE) IMPLANT
SUT VIC AB 3-0 SH 27 (SUTURE)
SUT VIC AB 3-0 SH 27X BRD (SUTURE) ×2 IMPLANT
SUT VICRYL 3-0 CR8 SH (SUTURE) ×7 IMPLANT
SUT VICRYL AB 2 0 TIE (SUTURE) ×2 IMPLANT
SUT VICRYL AB 2 0 TIES (SUTURE)
SYR 50ML LL SCALE MARK (SYRINGE) ×2 IMPLANT
SYR BULB EAR ULCER 3OZ GRN STR (SYRINGE) ×3 IMPLANT
SYR CONTROL 10ML LL (SYRINGE) ×3 IMPLANT
TOWEL GREEN STERILE FF (TOWEL DISPOSABLE) ×4 IMPLANT
TRAY FAXITRON CT DISP (TRAY / TRAY PROCEDURE) ×3 IMPLANT
TUBE CONNECTING 20X1/4 (TUBING) ×3 IMPLANT
UNDERPAD 30X36 HEAVY ABSORB (UNDERPADS AND DIAPERS) ×3 IMPLANT
YANKAUER SUCT BULB TIP NO VENT (SUCTIONS) ×3 IMPLANT

## 2020-11-09 NOTE — Anesthesia Preprocedure Evaluation (Addendum)
Anesthesia Evaluation  Patient identified by MRN, date of birth, ID band Patient awake    Reviewed: Allergy & Precautions, NPO status , Patient's Chart, lab work & pertinent test results  Airway Mallampati: II  TM Distance: >3 FB Neck ROM: Full    Dental  (+) Dental Advisory Given, Teeth Intact   Pulmonary neg pulmonary ROS,    Pulmonary exam normal breath sounds clear to auscultation       Cardiovascular negative cardio ROS Normal cardiovascular exam Rhythm:Regular Rate:Normal     Neuro/Psych negative neurological ROS  negative psych ROS   GI/Hepatic negative GI ROS, Neg liver ROS,   Endo/Other  diabetes, Poorly Controlled, Type 2, Oral Hypoglycemic AgentsLast a1c 9.4 Obesity BMI 31  Renal/GU negative Renal ROS  negative genitourinary   Musculoskeletal negative musculoskeletal ROS (+)   Abdominal   Peds  Hematology negative hematology ROS (+)   Anesthesia Other Findings Left breast ca  Reproductive/Obstetrics negative OB ROS                            Anesthesia Physical  Anesthesia Plan  ASA: II  Anesthesia Plan: General   Post-op Pain Management: GA combined w/ Regional for post-op pain   Induction: Intravenous  PONV Risk Score and Plan: 3 and Dexamethasone, Ondansetron, Midazolam, Treatment may vary due to age or medical condition, Diphenhydramine and Propofol infusion  Airway Management Planned: LMA  Additional Equipment: None  Intra-op Plan:   Post-operative Plan: Extubation in OR  Informed Consent: I have reviewed the patients History and Physical, chart, labs and discussed the procedure including the risks, benefits and alternatives for the proposed anesthesia with the patient or authorized representative who has indicated his/her understanding and acceptance.     Dental advisory given  Plan Discussed with: CRNA  Anesthesia Plan Comments:       Anesthesia  Quick Evaluation

## 2020-11-09 NOTE — Anesthesia Procedure Notes (Signed)
Anesthesia Regional Block: Pectoralis block   Pre-Anesthetic Checklist: ,, timeout performed, Correct Patient, Correct Site, Correct Laterality, Correct Procedure, Correct Position, site marked, Risks and benefits discussed,  Surgical consent,  Pre-op evaluation,  At surgeon's request and post-op pain management  Laterality: Left  Prep: chloraprep       Needles:   Needle Type: Stimiplex     Needle Length: 9cm      Additional Needles:   Procedures:,,,, ultrasound used (permanent image in chart),,,,  Narrative:  Start time: 11/09/2020 10:38 AM End time: 11/09/2020 10:43 AM Injection made incrementally with aspirations every 5 mL.  Performed by: Personally  Anesthesiologist: Nolon Nations, MD  Additional Notes: Patient tolerated well. Good fascial spread noted.

## 2020-11-09 NOTE — Anesthesia Procedure Notes (Signed)
Procedure Name: LMA Insertion Date/Time: 11/09/2020 11:45 AM Performed by: Signe Colt, CRNA Pre-anesthesia Checklist: Patient identified, Emergency Drugs available, Suction available and Patient being monitored Patient Re-evaluated:Patient Re-evaluated prior to induction Oxygen Delivery Method: Circle System Utilized Preoxygenation: Pre-oxygenation with 100% oxygen Induction Type: IV induction Ventilation: Mask ventilation without difficulty LMA: LMA inserted LMA Size: 4.0 Number of attempts: 1 Airway Equipment and Method: bite block Placement Confirmation: positive ETCO2 Tube secured with: Tape Dental Injury: Teeth and Oropharynx as per pre-operative assessment

## 2020-11-09 NOTE — Progress Notes (Signed)
Assisted Dr. Germeroth with left, ultrasound guided, pectoralis block. Side rails up, monitors on throughout procedure. See vital signs in flow sheet. Tolerated Procedure well. 

## 2020-11-09 NOTE — Transfer of Care (Signed)
Immediate Anesthesia Transfer of Care Note  Patient: Robin Kennedy  Procedure(s) Performed: LEFT MODIFIED MASTECTOMY WITH LYMPH NODE DISSECTION (Left Breast) RIGHT BREAST SEED LOCALIZED EXCISIONAL BIOPSY (Right Breast)  Patient Location: PACU  Anesthesia Type:GA combined with regional for post-op pain  Level of Consciousness: drowsy and patient cooperative  Airway & Oxygen Therapy: Patient Spontanous Breathing and Patient connected to face mask oxygen  Post-op Assessment: Report given to RN and Post -op Vital signs reviewed and stable  Post vital signs: Reviewed and stable  Last Vitals:  Vitals Value Taken Time  BP    Temp    Pulse 95 11/09/20 1534  Resp 10 11/09/20 1534  SpO2 99 % 11/09/20 1534  Vitals shown include unvalidated device data.  Last Pain:  Vitals:   11/09/20 1057  TempSrc:   PainSc: 0-No pain         Complications: No complications documented.

## 2020-11-09 NOTE — Discharge Instructions (Addendum)
[facility] [phone]  MASTECTOMY: POST OP INSTRUCTIONS  Always review your discharge instruction sheet given to you by the facility where your surgery was performed. IF YOU HAVE DISABILITY OR FAMILY LEAVE FORMS, YOU MUST BRING THEM TO THE OFFICE FOR PROCESSING.   DO NOT GIVE THEM TO YOUR DOCTOR. A prescription for pain medication may be given to you upon discharge.  Take your pain medication as prescribed, if needed.  If narcotic pain medicine is not needed, then you may take acetaminophen (Tylenol) or ibuprofen (Advil) as needed. 1. Take your usually prescribed medications unless otherwise directed. 2. If you need a refill on your pain medication, please contact your pharmacy.  They will contact our office to request authorization.  Prescriptions will not be filled after 5pm or on week-ends.     JP Drain [employer] this sheet to all of your post-operative appointments while you have your drains.  Please measure your drains by CC's or ML's.  Make sure you drain and measure your JP Drains 2 or 3 times per day.  At the end of each day, add up totals for the left side and add up totals for the right side.    ( 9 am )     ( 3 pm )        ( 9 pm )                Date L  R  L  R  L  R  Total L/R                                                                                                                                                                                        3. You should follow a light diet the first few days after arrival home, such as soup and crackers, etc.  Resume your normal diet the day after surgery. 4. Most patients will experience some swelling and bruising on the chest and underarm.  Ice packs will help.  Swelling and bruising can take several days to resolve.  5. It is common to experience some constipation if taking pain medication after surgery.  Increasing fluid intake and taking a stool softener (such as Colace) will  usually help or prevent this problem from occurring.  A mild laxative (Milk of Magnesia or Miralax) should be taken according to package instructions if there are no bowel movements after 48 hours. 6. Unless discharge instructions indicate otherwise, leave your bandage dry and in place until your next appointment in 3-5 days.  You may take a limited sponge bath.  No tube baths or showers until  the drains are removed.  You may have steri-strips (small skin tapes) in place directly over the incision.  These strips should be left on the skin for 7-10 days.  If your surgeon used skin glue on the incision, you may shower in 24 hours.  The glue will flake off over the next 2-3 weeks.  Any sutures or staples will be removed at the office during your follow-up visit. 7. DRAINS:  If you have drains in place, it is important to keep a list of the amount of drainage produced each day in your drains.  Before leaving the hospital, you should be instructed on drain care.  Call our office if you have any questions about your drains. 8. ACTIVITIES:  You may resume regular (light) daily activities beginning the next day--such as daily self-care, walking, climbing stairs--gradually increasing activities as tolerated.  You may have sexual intercourse when it is comfortable.  Refrain from any heavy lifting or straining until approved by your doctor. a. You may drive when you are no longer taking prescription pain medication, you can comfortably wear a seatbelt, and you can safely maneuver your car and apply brakes. b. RETURN TO WORK:  __________________________________________________________ 9. You should see your doctor in the office for a follow-up appointment approximately 3-5 days after your surgery.  Your doctor's nurse will typically make your follow-up appointment when she calls you with your pathology report.  Expect your pathology report 2-3 business days after your surgery.  You may call to check if you do not hear  from [place] after three days.   10. OTHER INSTRUCTIONS: ______________________________________________________________________________________________ ____________________________________________________________________________________________ WHEN TO CALL YOUR DOCTOR: 1. Fever over 101.0 2. Nausea and/or vomiting 3. Extreme swelling or bruising 4. Continued bleeding from incision. 5. Increased pain, redness, or drainage from the incision. The clinic staff is available to answer your questions during regular business hours.  Please don't hesitate to call and ask to speak to one of the nurses for clinical concerns.  If you have a medical emergency, go to the nearest emergency room or call 911.  A surgeon from [facility] is always on call at the hospital. [address] [place], [place], [place]  [address] ? [address], [place], [place]   [phone] ? [phone] ? FAX [phone]    [facility] drains are used to remove extra fluid that normally builds up in a surgical wound after surgery. A surgical drain helps to heal a surgical wound. Different kinds of surgical drains include:  Active drains. These drains use suction to pull drainage away from the surgical wound. Drainage flows through a tube to a container outside of the body. With these drains, you need to keep the bulb or the drainage container flat (compressed) at all times, except while you empty it. Flattening the bulb or container creates suction.  Passive drains. These drains allow fluid to drain naturally, by gravity. Drainage flows through a tube to a bandage (dressing) or a container outside of the body. Passive drains do not need to be emptied. A drain is placed during surgery. Right after surgery, drainage is usually bright red and a little thicker than water. The drainage may gradually turn yellow or pink and become thinner. It is likely that your health care provider will  remove the drain when the drainage stops or when the amount decreases to 1-2 Tbsp (15-30 mL) during a 24-hour period. Supplies needed:  Tape.  Germ-free cleaning solution (  sterile saline).  Cotton swabs.  Split gauze drain sponge: 4 x 4 inches (10 x 10 cm).  Gauze square: 4 x 4 inches (10 x 10 cm). How to care for your surgical drain Care for your drain as told by your health care provider. This is important to help prevent infection. If your drain is placed at your back, or any other hard-to-reach area, ask another person to assist you in performing the following tasks: General care  Keep the skin around the drain dry and covered with a dressing at all times.  Check your drain area every day for signs of infection. Check for: ? Redness, swelling, or pain. ? Pus or a bad smell. ? Cloudy drainage. ? Tenderness or pressure at the drain exit site. Changing the dressing Follow instructions from your health care provider about how to change your dressing. Change your dressing at least once a day. Change it more often if needed to keep the dressing dry. Make sure you: 1. Gather your supplies. 2. Wash your hands with soap and water before you change your dressing. If soap and water are not available, use hand sanitizer. 3. Remove the old dressing. Avoid using scissors to do that. 4. Wash your hands with soap and water again after removing the old dressing. 5. Use sterile saline to clean your skin around the drain. You may need to use a cotton swab to clean the skin. 6. Place the tube through the slit in a drain sponge. Place the drain sponge so that it covers your wound. 7. Place the gauze square or another drain sponge on top of the drain sponge that is on the wound. Make sure the tube is between those layers. 8. Tape the dressing to your skin. 9. Tape the drainage tube to your skin 1-2 inches (2.5-5 cm) below the place where the tube enters your body. Taping keeps the tube from pulling on  any stitches (sutures) that you have. 10. Wash your hands with soap and water. 11. Write down the color of your drainage and how often you change your dressing. How to empty your active drain 1. Make sure that you have a measuring cup that you can empty your drainage into. 2. Wash your hands with soap and water. If soap and water are not available, use hand sanitizer. 3. Loosen any pins or clips that hold the tube in place. 4. If your health care provider tells you to strip the tube to prevent clots and tube blockages: ? Hold the tube at the skin with one hand. Use your other hand to pinch the tubing with your thumb and first finger. ? Gently move your fingers down the tube while squeezing very lightly. This clears any drainage, clots, or tissue from the tube. ? You may need to do this several times each day to keep the tube clear. Do not pull on the tube. 5. Open the bulb cap or the drain plug. Do not touch the inside of the cap or the bottom of the plug. 6. Turn the device upside down and gently squeeze. 7. Empty all of the drainage into the measuring cup. 8. Compress the bulb or the container and replace the cap or the plug. To compress the bulb or the container, squeeze it firmly in the middle while you close the cap or plug the container. 9. Write down the amount of drainage that you have in each 24-hour period. If you have less than 2 Tbsp (30 mL) of drainage  during 24 hours, contact your health care provider. 10. Flush the drainage down the toilet. 11. Wash your hands with soap and water.   Contact a health care provider if:  You have redness, swelling, or pain around your drain area.  You have pus or a bad smell coming from your drain area.  You have a fever or chills.  The skin around your drain is warm to the touch.  The amount of drainage that you have is increasing instead of decreasing.  You have drainage that is cloudy.  There is a sudden stop or a sudden decrease in the  amount of drainage that you have.  Your drain tube falls out.  Your active drain does not stay compressed after you empty it. Summary  Surgical drains are used to remove extra fluid that normally builds up in a surgical wound after surgery.  Different kinds of surgical drains include active drains and passive drains. Active drains use suction to pull drainage away from the surgical wound, and passive drains allow fluid to drain naturally.  It is important to care for your drain to prevent infection. If your drain is placed at your back, or any other hard-to-reach area, ask another person to assist you.  Contact your health care provider if you have redness, swelling, or pain around your drain area. This information is not intended to replace advice given to you by your health care provider. Make sure you discuss any questions you have with your health care provider. Document Revised: 09/05/2018 Document Reviewed: 09/05/2018 Elsevier Patient Education  2021 [employer].

## 2020-11-09 NOTE — Interval H&P Note (Signed)
History and Physical Interval Note:  11/09/2020 11:29 AM  Robin Kennedy  has presented today for surgery, with the diagnosis of LEFT BREAST CANCER, ABNORMAL RIGHT MAMMOGRAM.  The various methods of treatment have been discussed with the patient and family. After consideration of risks, benefits and other options for treatment, the patient has consented to  Procedure(s): LEFT MODIFIED MASTECTOMY (Left) RIGHT BREAST SEED LOCALIZED EXCISIONAL BIOPSY (Right) as a surgical intervention.  The patient's history has been reviewed, patient examined, no change in status, stable for surgery.  I have reviewed the patient's chart and labs.  Questions were answered to the patient's satisfaction.     [name]

## 2020-11-09 NOTE — Op Note (Signed)
Left Modified Radical Mastectomy (left mastectomy with axillary lymph node dissection), right seed localized excisional biopsy  Indications: This patient presents with history of left breast cancer, stage IIB  [id number], grade 3 invasive ductal carcinoma, +/-/+ receptors,  s/p neoadjuvant chemotherapy, good imaging response and right abnormal breast MRI  Pre-operative Diagnosis: left breast cancer, right abnormal breast MRI  Post-operative Diagnosis: same  Surgeon: [name]   Anesthesia: General endotracheal anesthesia and pectoral block  ASA Class: 2  Procedure Details  The patient was seen in the Holding Room. The risks, benefits, complications, treatment options, and expected outcomes were discussed with the patient. The possibilities of reaction to medication, pulmonary aspiration, bleeding, infection, the need for additional procedures, failure to diagnose a condition, and creating a complication requiring transfusion or operation were discussed with the patient. The patient concurred with the proposed plan, giving informed consent.  The site of surgery properly noted/ma rked. The patient was taken to Operating Room # 1, identified as [employer] and the procedure verified as left modified radical Mastectomy and right seed localized excisional biopsy. After induction of anesthesia, the left arm, bilateral breast and chest were prepped and draped in standard fashion.  A Time Out was held and the above information confirmed.     The right side was addressed first as this was the presumed benign side.  A circumlinear incision was made inferior to the previously placed radioactive seed.  Dissection was carried down around the point of maximum signal intensity. The cautery was used to perform the dissection.   The specimen was inked with the margin marker paint kit.   The seed was very close to the skin and came out with the dissection.  This was identified by geiger counter and by  radiograph.  The background signal in the breast was zero.  One clip was placed in the breast cavity.  Hemostasis was achieved with cautery.  The wound was irrigated and closed with 3-0 vicryl interrupted deep dermal sutures and 4-0 monocryl running subcuticular suture.    The left breast was then addressed. The borders of the breast were identified and marked.  A anchor-type incision was marked out.  The incision was made with a #10 blade.  Mastectomy hooks were used to provide elevation of the skin edges, and the cautery was used to create the mastectomy flaps. Lighted retractors were also used to assist.   The dissection was taken to the fascia of the pectoralis major.  The penetrating vessels were clipped as needed.  The superior flap was taken medially to the lateral sternal border, superiorly to the inferior border of the clavicle.  The inferior flap was similarly created, inferiorly to the inframammary fold and laterally to the border of the latissimus.  The breast was taken off including the pectoralis fascia and the axillary tail marked.  Some additional tissue was taken off the superolateral flap as it was a bit thick.  This was marked.    An axillary dissection was performed with removal of the associated lymph nodes and surrounding adipose tissue. This included levels I and II. This was accomplished by exposing the axillary vein anteriorly and inferiorly to the level of the pectoralis minor and laterally over the latissimus dorsi muscle. Posteriorly, the dissection continued to the subscapularis.  Small venous tributaries, lymphatics, and vessels were clipped and ligated or cauterized and divided. The subscapularis muscle was skeletonized. The long thoracic and thoracodorsal neurovascular bundles were identified and preserved.      Additional  skin was trimmed to make the skin lay down flat.  Hemostasis was achieved with cautery.  Two 61 Fr Blake drains were placed, with the lateral one in the  axilla and the medial one under the skin flaps.  The wound was then closed with a 3-0 Vicryl deep dermal interrupted sutures in a anchor orientation and 4-0 monocryl subcuticular closure in layers.    Sterile dressings were applied. At the end of the operation, all sponge, instrument, and needle counts were correct.  Findings: Right sided specimen with anterior margin skin and posterior margin pectoralis.      Estimated Blood Loss: <100 mL         Drains: Two 19 Fr blake drains on left                Specimens: right breast tissue with seed, left breast, additional anterior margin 2 o'clock, and left axillary contents.         Complications:  None; patient tolerated the procedure well.         Disposition: PACU - hemodynamically stable.         Condition: stable

## 2020-11-10 ENCOUNTER — Encounter (HOSPITAL_BASED_OUTPATIENT_CLINIC_OR_DEPARTMENT_OTHER): Payer: Self-pay | Admitting: General Surgery

## 2020-11-10 NOTE — Anesthesia Postprocedure Evaluation (Signed)
Anesthesia Post Note  Patient: Industrial/product designer  Procedure(s) Performed: LEFT MODIFIED MASTECTOMY WITH LYMPH NODE DISSECTION (Left Breast) RIGHT BREAST SEED LOCALIZED EXCISIONAL BIOPSY (Right Breast)     Patient location during evaluation: PACU Anesthesia Type: General Level of consciousness: sedated and patient cooperative Pain management: pain level controlled Vital Signs Assessment: post-procedure vital signs reviewed and stable Respiratory status: spontaneous breathing Cardiovascular status: stable Anesthetic complications: no   No complications documented.  Last Vitals:  Vitals:   11/10/20 0715 11/10/20 0845  BP:    Pulse: 92 94  Resp:    Temp:    SpO2: 98% 96%    Last Pain:  Vitals:   11/10/20 0845  TempSrc:   PainSc: [place]

## 2020-11-12 ENCOUNTER — Encounter: Payer: Self-pay | Admitting: *Deleted

## 2020-11-12 LAB — SURGICAL PATHOLOGY

## 2020-11-16 NOTE — Progress Notes (Signed)
Patient Care Team: Lavonia Drafts, MD as PCP - General (Obstetrics and Gynecology) Shawnee Knapp, MD (Family Medicine) Mauro Kaufmann, RN as Oncology Nurse Navigator Rockwell Germany, RN as Oncology Nurse Navigator [name], MD as Consulting Physician (General Surgery) Nicholas Lose, MD as Consulting Physician (Hematology and Oncology) Eppie Gibson, MD as Attending Physician (Radiation Oncology)  DIAGNOSIS:    ICD-10-CM   1. Malignant neoplasm of upper-inner quadrant of left breast in female, estrogen receptor positive ([place])  C50.212    Z17.0     SUMMARY OF ONCOLOGIC HISTORY: Oncology History  Malignant neoplasm of upper-inner quadrant of left breast in female, estrogen receptor positive ([place])  06/05/2020 Initial Diagnosis   06/05/2020: Enlarging left breast with pain and diffuse skin thickening: Mammogram revealed 3.5 cm mass 11 o'clock position with 3 abnormal lymph nodes: 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 Right breast benign-appearing calcifications previous stereotactic biopsy was attempted but it was too superficial and felt to be benign.   06/25/2020 - 10/09/2020 Chemotherapy   Neoadjuvant chemotherapy with TCH Perjeta 6 cycles followed by Herceptin Perjeta maintenance for 1 year       06/29/2020 Genetic Testing   Negative genetic testing: no pathogenic variants detected in [facility] Common Hereditary Cancers Panel.  The report date is June 29, 2020.   The Common Hereditary Cancers Panel offered by [facility] includes sequencing and/or deletion duplication testing of the following 48 genes: APC, ATM, AXIN2, BARD1, BMPR1A, BRCA1, BRCA2, BRIP1, CDH1, CDK4, CDKN2A (p14ARF), CDKN2A (p16INK4a), CHEK2, CTNNA1, DICER1, EPCAM (Deletion/duplication testing only), GREM1 (promoter region deletion/duplication testing only), KIT, MEN1, MLH1, MSH2, MSH3, MSH6, MUTYH, NBN, NF1, NHTL1, PALB2, PDGFRA, PMS2, POLD1, POLE, PTEN,  RAD50, RAD51C, RAD51D, RNF43, SDHB, SDHC, SDHD, SMAD4, SMARCA4. STK11, TP53, TSC1, TSC2, and VHL.  The following genes were evaluated for sequence changes only: SDHA and HOXB13 c.251G>A variant only.   10/28/2020 -  Chemotherapy      Patient is on Antibody Plan: BREAST TRASTUZUMAB + PERTUZUMAB Q21D    11/09/2020 Surgery   Right lumpectomy and left mastectomy [name]):  Right breast: no evidence of malignancy Left breast: no residual carcinoma, with 19 left axillary lymph nodes negative for carcinoma.     CHIEF COMPLIANT: Follow-up s/p right lumpectomy and left mastectomy   INTERVAL HISTORY: Breeann Reposa is a 45 y.o. with above-mentioned history of inflammatory left breast who completed neoadjuvant chemotherapy with TCH Perjeta. She underwent a right lumpectomy and left mastectomy on 11/09/20 with Dr. [name] for which pathology showed in the right breast, no evidence of malignancy, and in the left breast, no residual carcinoma, with 19 left axillary lymph nodes negative for carcinoma. She presents to the clinic todayto discuss the pathology report and further treatment.  She is still in a lot of discomfort from the recent surgeries.  ALLERGIES:  has No Known Allergies.  MEDICATIONS:  Current Outpatient Medications  Medication Sig Dispense Refill  . glucose blood (ONE TOUCH ULTRA TEST) test strip Use to check cbgs qd 100 each 11  . lidocaine-prilocaine (EMLA) cream Apply 1 application topically as needed. 30 g 1  . metFORMIN (GLUCOPHAGE-XR) 500 MG 24 hr tablet Take 1,000 mg by mouth 2 (two) times daily.  3  . methocarbamol (ROBAXIN) 500 MG tablet Take 1 tablet (500 mg total) by mouth every 6 (six) hours as needed for muscle spasms. 20 tablet 1  . oxyCODONE (OXY IR/ROXICODONE) 5 MG immediate release tablet Take 1  tablet (5 mg total) by mouth every 6 (six) hours as needed for severe pain. 15 tablet 0  . potassium chloride SA (KLOR-CON) 20 MEQ tablet Take 1 tablet (20 mEq total) by  mouth daily. 30 tablet 0  . TRESIBA FLEXTOUCH 100 UNIT/ML FlexTouch Pen Inject 10 Units into the skin daily after breakfast.     No current facility-administered medications for this visit.    PHYSICAL EXAMINATION: ECOG PERFORMANCE STATUS: 1 - Symptomatic but completely ambulatory  Vitals:   11/17/20 0827  BP: (!) 128/91  Pulse: (!) 104  Resp: 20  Temp: (!) 97.5 F (36.4 C)  SpO2: 100%   Filed Weights   11/17/20 0827  Weight: 182 lb 3.2 oz (82.6 kg)    LABORATORY DATA:  I have reviewed the data as listed CMP Latest Ref Rng & Units 10/28/2020 10/07/2020 09/16/2020  Glucose 70 - 99 mg/dL 216(H) 296(H) 163(H)  BUN 6 - 20 mg/dL '9 9 8  ' Creatinine 0.44 - 1.00 mg/dL 0.79 0.80 0.82  Sodium 135 - 145 mmol/L 139 140 143  Potassium 3.5 - 5.1 mmol/L 3.2(L) 3.6 3.4(L)  Chloride 98 - 111 mmol/L 108 106 108  CO2 22 - 32 mmol/L '23 26 28  ' Calcium 8.9 - 10.3 mg/dL 8.7(L) 9.0 9.1  Total Protein 6.5 - 8.1 g/dL 6.2(L) 6.5 6.5  Total Bilirubin 0.3 - 1.2 mg/dL 0.4 0.5 0.5  Alkaline Phos 38 - 126 U/L 51 53 59  AST 15 - 41 U/L 10(L) 10(L) 13(L)  ALT 0 - 44 U/L '8 9 11    ' Lab Results  Component Value Date   WBC 5.0 10/28/2020   HGB 8.2 (L) 10/28/2020   HCT 25.9 (L) 10/28/2020   MCV 93.8 10/28/2020   PLT 201 10/28/2020   NEUTROABS 2.6 10/28/2020    ASSESSMENT & PLAN:  Malignant neoplasm of upper-inner quadrant of left breast in female, estrogen receptor positive ([place]) 06/05/2020: Enlarging left breast with pain and diffuse skin thickening: Mammogram revealed 3.5 cm mass 11 o'clock position with 3 abnormal lymph nodes: 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 Right breast benign-appearing calcifications previous stereotactic biopsy was attempted but it was too superficial and felt to be benign.  Recommendationbased on multidisciplinary tumor board: 1. Neoadjuvant chemotherapy with TCH Perjeta 6 cycles followed by Herceptin Perjeta maintenance for 1  year 2. 11/09/20: Right lumpectomy and left mastectomy [name]): Right breast: no evidence of malignancy Left breast: no residual carcinoma, with 19 left axillary lymph nodes negative for carcinoma. 3. Followed by adjuvant radiation therapy (due to inflammatory breast changes at diagnosis) ------------------------------------------------------------------------------------------------------------------------------------------ Pathology counseling: I discussed the final pathology report of the patient provided  a copy of this report. I discussed the margins as well as lymph node surgeries. We also discussed the final staging along with previously performed ER/PR and HER-2/neu testing.  10/16/2020: CT CAP: Decrease in cutaneous thickening.  Decrease in size of left axillary, subpectoral and supraclavicular lymph nodes, no distant metastatic disease.  Treatment plan: Herceptin Perjeta maintenance, referral to radiation She plans to go to [place] for her daughter's graduation ceremony May 14. Return to clinic every 3 weeks for Herceptin Perjeta and every 6 weeks with labs and follow-up with me.  No orders of the defined types were placed in this encounter.  The patient has a good understanding of the overall plan. she agrees with it. she will call with any problems that may develop before the next  visit here.  Total time spent: 30 mins including face to face time and time spent for planning, charting and coordination of care  Rulon Eisenmenger, MD, MPH 11/17/2020  I, Cloyde Reams Dorshimer, am acting as scribe for Dr. Nicholas Lose.  I have reviewed the above documentation for accuracy and completeness, and I agree with the above.

## 2020-11-17 ENCOUNTER — Inpatient Hospital Stay: Payer: 59 | Attending: Hematology and Oncology | Admitting: Hematology and Oncology

## 2020-11-17 ENCOUNTER — Encounter: Payer: Self-pay | Admitting: *Deleted

## 2020-11-17 ENCOUNTER — Other Ambulatory Visit: Payer: Self-pay

## 2020-11-17 DIAGNOSIS — Z7984 Long term (current) use of oral hypoglycemic drugs: Secondary | ICD-10-CM | POA: Insufficient documentation

## 2020-11-17 DIAGNOSIS — C50212 Malignant neoplasm of upper-inner quadrant of left female breast: Secondary | ICD-10-CM

## 2020-11-17 DIAGNOSIS — Z79899 Other long term (current) drug therapy: Secondary | ICD-10-CM | POA: Diagnosis not present

## 2020-11-17 DIAGNOSIS — Z170 Estrogen receptor positive status [ER+]: Secondary | ICD-10-CM | POA: Diagnosis not present

## 2020-11-17 DIAGNOSIS — E119 Type 2 diabetes mellitus without complications: Secondary | ICD-10-CM | POA: Diagnosis not present

## 2020-11-17 DIAGNOSIS — Z5112 Encounter for antineoplastic immunotherapy: Secondary | ICD-10-CM | POA: Insufficient documentation

## 2020-11-17 NOTE — Assessment & Plan Note (Signed)
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 Right breast benign-appearing calcifications previous stereotactic biopsy was attempted but it was too superficial and felt to be benign.  Recommendationbased on multidisciplinary tumor board: 1. Neoadjuvant chemotherapy with TCH Perjeta 6 cycles followed by Herceptin Perjeta maintenance for 1 year 2. 11/09/20: Right lumpectomy and left mastectomy [name]): Right breast: no evidence of malignancy Left breast: no residual carcinoma, with 19 left axillary lymph nodes negative for carcinoma. 3. Followed by adjuvant radiation therapy (due to inflammatory breast changes at diagnosis) ------------------------------------------------------------------------------------------------------------------------------------------ Pathology counseling: I discussed the final pathology report of the patient provided  a copy of this report. I discussed the margins as well as lymph node surgeries. We also discussed the final staging along with previously performed ER/PR and HER-2/neu testing.  10/16/2020: CT CAP: Decrease in cutaneous thickening.  Decrease in size of left axillary, subpectoral and supraclavicular lymph nodes, no distant metastatic disease.  Treatment plan: Herceptin Perjeta maintenance, referral to radiation Return to clinic every 3 weeks for Herceptin Perjeta and every 6 weeks with labs and follow-up with me.

## 2020-11-23 ENCOUNTER — Encounter: Payer: Self-pay | Admitting: *Deleted

## 2020-11-26 ENCOUNTER — Inpatient Hospital Stay: Payer: 59

## 2020-11-26 ENCOUNTER — Other Ambulatory Visit: Payer: Self-pay

## 2020-11-26 VITALS — BP 118/83 | HR 96 | Temp 97.8°F | Resp 16 | Wt 179.8 lb

## 2020-11-26 DIAGNOSIS — Z5112 Encounter for antineoplastic immunotherapy: Secondary | ICD-10-CM | POA: Diagnosis not present

## 2020-11-26 DIAGNOSIS — Z170 Estrogen receptor positive status [ER+]: Secondary | ICD-10-CM

## 2020-11-26 DIAGNOSIS — C50212 Malignant neoplasm of upper-inner quadrant of left female breast: Secondary | ICD-10-CM

## 2020-11-26 MED ORDER — HEPARIN SOD (PORK) LOCK FLUSH 100 UNIT/ML IV SOLN
500.0000 [IU] | Freq: Once | INTRAVENOUS | Status: AC | PRN
  Administered 2020-11-26: 500 [IU]
  Filled 2020-11-26: qty 5

## 2020-11-26 MED ORDER — SODIUM CHLORIDE 0.9% FLUSH
10.0000 mL | INTRAVENOUS | Status: DC | PRN
  Administered 2020-11-26: 10 mL
  Filled 2020-11-26: qty 10

## 2020-11-26 MED ORDER — DIPHENHYDRAMINE HCL 25 MG PO CAPS
ORAL_CAPSULE | ORAL | Status: AC
  Filled 2020-11-26: qty 2

## 2020-11-26 MED ORDER — ACETAMINOPHEN 325 MG PO TABS
ORAL_TABLET | ORAL | Status: AC
  Filled 2020-11-26: qty 2

## 2020-11-26 MED ORDER — TRASTUZUMAB-DKST CHEMO 150 MG IV SOLR
6.0000 mg/kg | Freq: Once | INTRAVENOUS | Status: AC
  Administered 2020-11-26: 525 mg via INTRAVENOUS
  Filled 2020-11-26: qty 25

## 2020-11-26 MED ORDER — SODIUM CHLORIDE 0.9 % IV SOLN
420.0000 mg | Freq: Once | INTRAVENOUS | Status: AC
  Administered 2020-11-26: 420 mg via INTRAVENOUS
  Filled 2020-11-26: qty 14

## 2020-11-26 MED ORDER — DIPHENHYDRAMINE HCL 25 MG PO CAPS
50.0000 mg | ORAL_CAPSULE | Freq: Once | ORAL | Status: AC
  Administered 2020-11-26: 50 mg via ORAL

## 2020-11-26 MED ORDER — SODIUM CHLORIDE 0.9 % IV SOLN
Freq: Once | INTRAVENOUS | Status: AC
  Filled 2020-11-26: qty 250

## 2020-11-26 MED ORDER — ACETAMINOPHEN 325 MG PO TABS
650.0000 mg | ORAL_TABLET | Freq: Once | ORAL | Status: AC
  Administered 2020-11-26: 650 mg via ORAL

## 2020-11-26 NOTE — Patient Instructions (Signed)
[facility] Discharge Instructions for Patients Receiving Chemotherapy  Today you received the following chemotherapy agents herceptin, perjeta  To help prevent nausea and vomiting after your treatment, we encourage you to take your nausea medication as directed.   If you develop nausea and vomiting that is not controlled by your nausea medication, call the clinic.   BELOW ARE SYMPTOMS THAT SHOULD BE REPORTED IMMEDIATELY:  *FEVER GREATER THAN 100.5 F  *CHILLS WITH OR WITHOUT FEVER  NAUSEA AND VOMITING THAT IS NOT CONTROLLED WITH YOUR NAUSEA MEDICATION  *UNUSUAL SHORTNESS OF BREATH  *UNUSUAL BRUISING OR BLEEDING  TENDERNESS IN MOUTH AND THROAT WITH OR WITHOUT PRESENCE OF ULCERS  *URINARY PROBLEMS  *BOWEL PROBLEMS  UNUSUAL RASH Items with * indicate a potential emergency and should be followed up as soon as possible.  Feel free to call the clinic should you have any questions or concerns. The clinic phone number is [phone].  Please show the [facility] at check-in to the Emergency Department and triage nurse.

## 2020-11-30 ENCOUNTER — Other Ambulatory Visit (HOSPITAL_COMMUNITY): Payer: 59

## 2020-12-03 ENCOUNTER — Other Ambulatory Visit: Payer: Self-pay

## 2020-12-03 ENCOUNTER — Ambulatory Visit: Payer: 59 | Attending: General Surgery | Admitting: Physical Therapy

## 2020-12-03 ENCOUNTER — Encounter: Payer: Self-pay | Admitting: Physical Therapy

## 2020-12-03 DIAGNOSIS — C50212 Malignant neoplasm of upper-inner quadrant of left female breast: Secondary | ICD-10-CM

## 2020-12-03 DIAGNOSIS — R293 Abnormal posture: Secondary | ICD-10-CM | POA: Diagnosis present

## 2020-12-03 DIAGNOSIS — Z170 Estrogen receptor positive status [ER+]: Secondary | ICD-10-CM | POA: Insufficient documentation

## 2020-12-03 DIAGNOSIS — M25612 Stiffness of left shoulder, not elsewhere classified: Secondary | ICD-10-CM | POA: Diagnosis present

## 2020-12-03 NOTE — Patient Instructions (Signed)
            [facility]         [address], [place] [address]         [phone]         Annia Friendly, PT, CLT   After Breast Cancer Class It is recommended you attend the ABC class to be educated on lymphedema risk reduction. This class is free of charge and lasts for 1 hour. It is a 1-time class.  You are scheduled for May 2nd at 11:00. We will send you a link and you can join the meeting through Webex.  Scar massage You can begin gentle scar massage using coconut oil to the right lumpectomy incision. After you see Dr. [name] tomorrow and drains are removed, you can begin gentle scar massage to your mastectomy incisions but wait until the drain sites are healed.   Compression garment You can look into a sports bra that would help provide you support and compression as long as it does not rub the drain site areas. Also look into getting a knitted knocker for the left side until you can wear a breast prosthesis.   Home exercise Program When drains are out tomorrow, you can begin the exercises I gave you and do them to your tolerance.   Follow up PT: It is recommended you return every 3 months for the first 3 years following surgery to be assessed on the SOZO machine for an L-Dex score. This helps prevent clinically significant lymphedema in 95% of patients. These follow up screens are 15 minute appointments that you are not billed for. You are scheduled for June 13th at 4:15.

## 2020-12-03 NOTE — Therapy (Signed)
[place], [place], [address] Phone: [phone]   Fax:  [phone]  Physical Therapy Treatment  Patient Details  Name: Robin Kennedy MRN: [id number] Date of Birth: 06-Aug-1976 Referring Provider (PT): Dr. [name]   Encounter Date: 12/03/2020   PT End of Session - 12/03/20 1252    Visit Number 2    Number of Visits 10    Date for PT Re-Evaluation 12/31/20    PT Start Time 1058    PT Stop Time 1205    PT Time Calculation (min) 67 min    Activity Tolerance Patient tolerated treatment well    Behavior During Therapy [facility] for tasks assessed/performed           Past Medical History:  Diagnosis Date  . Diabetes mellitus without complication ([place])   . Personal history of chemotherapy   . SVD (spontaneous vaginal delivery)    X 3    Past Surgical History:  Procedure Laterality Date  . BREAST BIOPSY Left 06/02/2020   x2  . BREAST BIOPSY Right 06/22/2020  . CYSTOSCOPY N/A 12/20/2016   Procedure: CYSTOSCOPY;  Surgeon: Lavonia Drafts, MD;  Location: [facility];  Service: Gynecology;  Laterality: N/A;  . MODIFIED MASTECTOMY Left 11/09/2020   Procedure: LEFT MODIFIED MASTECTOMY WITH LYMPH NODE DISSECTION;  Surgeon: [name], MD;  Location: [facility];  Service: General;  Laterality: Left;  . PORTACATH PLACEMENT Left 06/23/2020   Procedure: INSERTION PORT-A-CATH WITH ULTRASOUND GUIDANCE;  Surgeon: [name], MD;  Location: [facility];  Service: General;  Laterality: Left;  . RADIOACTIVE SEED GUIDED EXCISIONAL BREAST BIOPSY Right 11/09/2020   Procedure: RIGHT BREAST SEED LOCALIZED EXCISIONAL BIOPSY;  Surgeon: [name], MD;  Location: [facility];  Service: General;  Laterality: Right;  . ROBOTIC ASSISTED TOTAL HYSTERECTOMY WITH BILATERAL SALPINGO OOPHERECTOMY Bilateral 12/20/2016   Procedure: ROBOTIC ASSISTED TOTAL HYSTERECTOMY WITH  BILATERAL SALPINGO OOPHORECTOMY;  Surgeon: Lavonia Drafts, MD;  Location: [facility];  Service: Gynecology;  Laterality: Bilateral;  . TUBAL LIGATION    . WISDOM TOOTH EXTRACTION      There were no vitals filed for this visit.   Subjective Assessment - 12/03/20 1106    Subjective Patient reports she underwent neoadjuvant chemotherapy 06/25/2020-10/07/2020 followed by a right lumpectomy and a left mastectomy with an axillary lymph node dissection (19 negative nodes removed) on 11/09/2020. She is continuing with Herceptin every 3 weeks. She will also undergo radiation. She still has drains in that will be removed 12/04/2020. She also reports she lost her 45 y.o. son to suicide on 10/07/2020.    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 It is ER positive, PR negative, and HER2 positive with a Ki67 of 60%. She underwent neoadjuvant chemotherapy 06/25/2020-10/07/2020 followed by a right lumpectomy and a left mastectomy with an axillary lymph node dissection (19 negative nodes removed) on 11/09/2020. She has type II diabetes.    Patient Stated Goals Get my arm moving again    Currently in Pain? Yes    Pain Score 6     Pain Location Axilla   and chest   Pain Orientation Left    Pain Descriptors / Indicators Tightness    Pain Type Surgical pain    Pain Onset 1 to 4 weeks ago    Pain Frequency Intermittent    Aggravating Factors  Night time is worse    Pain Relieving Factors  Supporting arm on pillow              [facility] PT Assessment - 12/03/20 0001      Assessment   Medical Diagnosis s/p left mastectomy ALND and right lumpectomy    Referring Provider (PT) Dr. [name]    Onset Date/Surgical Date 11/09/20    Hand Dominance Right    Prior Therapy Baselines      Precautions   Precautions Other (comment)    Precaution Comments recent surgery and high risk left arm lymphedema      Restrictions   Weight Bearing  Restrictions No      Balance Screen   Has the patient fallen in the past 6 months No    Has the patient had a decrease in activity level because of a fear of falling?  No    Is the patient reluctant to leave their home because of a fear of falling?  No      Home Environment   Living Environment Private residence    Living Arrangements Spouse/significant other    Available Help at Discharge Family      Prior Function   Level of Independence Independent    Vocation Full time employment    Vocation Requirements She is not currently working but plans to go back 12/08/2020    Leisure She is walking 30-60 min/day      Cognition   Overall Cognitive Status Within Functional Limits for tasks assessed      Observation/Other Assessments   Observations Right lumpectomy incision is healed well. Left mastectomy incisions are healing well with no redness or edema noted. She has a vertical incision and a horizontal incision likely due to her ALND and superior location of her cancer.      Posture/Postural Control   Posture/Postural Control Postural limitations    Postural Limitations Rounded Shoulders;Forward head      ROM / Strength   AROM / PROM / Strength AROM      AROM   AROM Assessment Site Shoulder    Right/Left Shoulder Right;Left    Right Shoulder Extension 66 Degrees    Right Shoulder Flexion 158 Degrees    Right Shoulder ABduction 168 Degrees    Right Shoulder Internal Rotation 66 Degrees    Right Shoulder External Rotation 83 Degrees    Left Shoulder Extension 46 Degrees    Left Shoulder Flexion 42 Degrees    Left Shoulder ABduction 64 Degrees      Strength   Overall Strength Unable to assess;Due to precautions             LYMPHEDEMA/ONCOLOGY QUESTIONNAIRE - 12/03/20 0001      Type   Cancer Type Left breast cancer      Surgeries   Mastectomy Date 11/09/20   left side   Lumpectomy Date 11/09/20   right side   Axillary Lymph Node Dissection Date 11/09/20    Number  Lymph Nodes Removed 19   On left; none on right     Treatment   Active Chemotherapy Treatment No    Past Chemotherapy Treatment Yes    Date 10/07/20    Active Radiation Treatment No    Past Radiation Treatment No    Current Hormone Treatment No    Past Hormone Therapy No      What other symptoms do you have   Are you Having Heaviness or Tightness Yes    Are you having Pain Yes    Are you having  pitting edema No    Is it Hard or Difficult finding clothes that fit No    Do you have infections No    Is there Decreased scar mobility Yes    [place] Sign No      Lymphedema Assessments   Lymphedema Assessments Upper extremities      Right Upper Extremity Lymphedema   10 cm Proximal to Olecranon Process 27.7 cm    Olecranon Process 24 cm    10 cm Proximal to Ulnar Styloid Process 22.3 cm    Just Proximal to Ulnar Styloid Process 15.8 cm    Across Hand at [employer] 18.9 cm    At [place] of 2nd Digit 6.3 cm      Left Upper Extremity Lymphedema   10 cm Proximal to Olecranon Process 29.1 cm    Olecranon Process 24.2 cm    10 cm Proximal to Ulnar Styloid Process 22.1 cm    Just Proximal to Ulnar Styloid Process 15.2 cm    Across Hand at [employer] 19.3 cm    At [place] of 2nd Digit 6.5 cm              Quick Dash - 12/03/20 0001    Open a tight or new jar Severe difficulty    Do heavy household chores (wash walls, wash floors) Severe difficulty    Carry a shopping bag or briefcase Severe difficulty    Wash your back Unable    Use a knife to cut food Moderate difficulty    Recreational activities in which you take some force or impact through your arm, shoulder, or hand (golf, hammering, tennis) Severe difficulty    During the past week, to what extent has your arm, shoulder or hand problem interfered with your normal social activities with family, friends, neighbors, or groups? Quite a bit    During the past week, to what extent has your arm, shoulder or hand problem  limited your work or other regular daily activities Quite a bit    Arm, shoulder, or hand pain. Severe    Tingling (pins and needles) in your arm, shoulder, or hand Extreme    Difficulty Sleeping Moderate difficulty    DASH Score 75 %                          PT Education - 12/03/20 1251    Education Details Aftercare; scar massage; ROM exercises    Person(s) Educated Patient    Methods Explanation;Demonstration;Handout    Comprehension Verbalized understanding;Returned demonstration               PT Long Term Goals - 12/03/20 1330      PT LONG TERM GOAL #1   Title Patient will demonstrate she has regained full shoulder ROM and function post op compared to baseline assessments.    Time 4    Period Weeks    Status On-going    Target Date 12/31/20      PT LONG TERM GOAL #2   Title Patient will increase left shoulder flexion AROM to >/= 140 degrees for increased ease reaching overhead.    Baseline 42 post op / 151 pre-op    Time 4    Period Weeks    Status New    Target Date 12/31/20      PT LONG TERM GOAL #3   Title Patient will increase left shoulder AROM abduction to >/= 150 degrees for  increased ability to obtain radiation positioning.    Baseline 64 degrees post op / 167 pre-op    Time 4    Period Weeks    Status New    Target Date 12/31/20      PT LONG TERM GOAL #4   Title Patient will improve her DASH score to </= 15 for improved overall UE function.    Baseline 4.55 pre-op; 75 post op    Time 4    Period Weeks    Status New    Target Date 12/31/20      PT LONG TERM GOAL #5   Title Patient will verbalize understnading of lymphedema risk reduction practices.    Time 4    Period Weeks    Status New    Target Date 12/31/20                 Plan - 12/03/20 1252    Clinical Impression Statement Patient appears to be recovering well s/p left mastectomy with ALND (19 negative nodes) and right lumpectomy on 11/09/2020. She had  chemotherapy prior to surgery which ended on 10/07/2020. Sadly on her last day of chemotherapy, she reports her 53 y.o. son was found who died from suicide so she has had a very hard time this year with the grief of that and the stress of her cncer treatments, but appears to have good support and is healing well. She has 2 drains still in place from her left mastectomy which she reports will likely be removed tomorrow. She will benefit from PT to regain shoulder ROM as her  left should is significantly limited and may prohibit radiation positioning until it improves.    PT Frequency 2x / week    PT Duration 4 weeks    PT Treatment/Interventions ADLs/Self Care Home Management;Therapeutic exercise;Patient/family education;Manual techniques;Manual lymph drainage;Passive range of motion;Scar mobilization    PT Next Visit Plan Begin PROM and ROM exercises    PT Home Exercise Plan Post op shoulder ROM HEP    Consulted and Agree with Plan of Care Patient           Patient will benefit from skilled therapeutic intervention in order to improve the following deficits and impairments:  Postural dysfunction,Decreased knowledge of precautions,Impaired UE functional use,Pain,Decreased range of motion,Decreased scar mobility  Visit Diagnosis: Malignant neoplasm of upper-inner quadrant of left breast in female, estrogen receptor positive ([place]) - Plan: PT plan of care cert/re-cert  Abnormal posture - Plan: PT plan of care cert/re-cert  Stiffness of left shoulder, not elsewhere classified - Plan: PT plan of care cert/re-cert     Problem List Patient Active Problem List   Diagnosis Date Noted  . Breast cancer metastasized to axillary lymph node, left ([place]) 11/09/2020  . Port-A-Cath in place 07/02/2020  . Genetic testing 06/29/2020  . Malignant neoplasm of upper-inner quadrant of left breast in female, estrogen receptor positive ([place]) 06/05/2020  . Post-operative state 12/20/2016  . Fibroids 12/06/2016   . Pelvic pain in female 12/06/2016  . Diabetes mellitus ([place]) 06/19/2012   Annia Friendly, PT 12/03/20 1:48 PM  [facility] [place], [place], [address] Phone: [phone]   Fax:  [phone]  Name: Nitika Jackowski MRN: [id number] Date of Birth: 08-05-1976

## 2020-12-07 ENCOUNTER — Other Ambulatory Visit: Payer: Self-pay

## 2020-12-07 ENCOUNTER — Encounter: Payer: Self-pay | Admitting: General Practice

## 2020-12-07 ENCOUNTER — Ambulatory Visit: Payer: 59 | Admitting: Radiation Oncology

## 2020-12-07 ENCOUNTER — Ambulatory Visit: Payer: 59

## 2020-12-07 DIAGNOSIS — C50212 Malignant neoplasm of upper-inner quadrant of left female breast: Secondary | ICD-10-CM

## 2020-12-07 DIAGNOSIS — R293 Abnormal posture: Secondary | ICD-10-CM

## 2020-12-07 DIAGNOSIS — Z170 Estrogen receptor positive status [ER+]: Secondary | ICD-10-CM

## 2020-12-07 DIAGNOSIS — M25612 Stiffness of left shoulder, not elsewhere classified: Secondary | ICD-10-CM

## 2020-12-07 NOTE — Therapy (Signed)
[place], [place], [address] Phone: [phone]   Fax:  [phone]  Physical Therapy Treatment  Patient Details  Name: Robin Kennedy MRN: [id number] Date of Birth: 1976-04-06 Referring Provider (PT): Dr. [name]   Encounter Date: 12/07/2020   PT End of Session - 12/07/20 1100    Visit Number 3    Number of Visits 10    Date for PT Re-Evaluation 12/31/20    PT Start Time 1005    PT Stop Time 1055    PT Time Calculation (min) 50 min    Activity Tolerance Patient tolerated treatment well    Behavior During Therapy [facility] for tasks assessed/performed           Past Medical History:  Diagnosis Date  . Diabetes mellitus without complication ([place])   . Personal history of chemotherapy   . SVD (spontaneous vaginal delivery)    X 3    Past Surgical History:  Procedure Laterality Date  . BREAST BIOPSY Left 06/02/2020   x2  . BREAST BIOPSY Right 06/22/2020  . CYSTOSCOPY N/A 12/20/2016   Procedure: CYSTOSCOPY;  Surgeon: Lavonia Drafts, MD;  Location: [facility];  Service: Gynecology;  Laterality: N/A;  . MODIFIED MASTECTOMY Left 11/09/2020   Procedure: LEFT MODIFIED MASTECTOMY WITH LYMPH NODE DISSECTION;  Surgeon: [name], MD;  Location: [place];  Service: General;  Laterality: Left;  . PORTACATH PLACEMENT Left 06/23/2020   Procedure: INSERTION PORT-A-CATH WITH ULTRASOUND GUIDANCE;  Surgeon: [name], MD;  Location: [place];  Service: General;  Laterality: Left;  . RADIOACTIVE SEED GUIDED EXCISIONAL BREAST BIOPSY Right 11/09/2020   Procedure: RIGHT BREAST SEED LOCALIZED EXCISIONAL BIOPSY;  Surgeon: [name], MD;  Location: [place];  Service: General;  Laterality: Right;  . ROBOTIC ASSISTED TOTAL HYSTERECTOMY WITH BILATERAL SALPINGO OOPHERECTOMY Bilateral 12/20/2016   Procedure: ROBOTIC ASSISTED TOTAL HYSTERECTOMY WITH  BILATERAL SALPINGO OOPHORECTOMY;  Surgeon: Lavonia Drafts, MD;  Location: [facility];  Service: Gynecology;  Laterality: Bilateral;  . TUBAL LIGATION    . WISDOM TOOTH EXTRACTION      There were no vitals filed for this visit.   Subjective Assessment - 12/07/20 1005    Subjective She got drains out on Friday and she has been doing the exercises.  Thinks they are going OK. Took a muscle relaxer this am to help with the stretching. Pt will return to her full time job tomorrow but will work from home.    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 It is ER positive, PR negative, and HER2 positive with a Ki67 of 60%. She underwent neoadjuvant chemotherapy 06/25/2020-10/07/2020 followed by a right lumpectomy and a left mastectomy with an axillary lymph node dissection (19 negative nodes removed) on 11/09/2020. She has type II diabetes.    Patient Stated Goals Get my arm moving again    Currently in Pain? Yes    Pain Score 5     Pain Location Axilla    Pain Orientation Left    Pain Descriptors / Indicators Tightness    Pain Type Surgical pain    Pain Onset 1 to 4 weeks ago    Pain Frequency Intermittent    Effect of Pain on Daily Activities limits daily activities  [place] Adult PT Treatment/Exercise - 12/07/20 0001      Shoulder Exercises: Supine   Other Supine Exercises AAflex, stargazer x 5    Other Supine Exercises supine wand flexion/scaption x 5      Manual Therapy   Myofascial Release to left axillary region, medial arm area of cording    Passive ROM PROM left shoulder flex, scaption, ER, D2 flexion                  PT Education - 12/07/20 1059    Education Details Pt was educated in supine wand flex, scaption and ER and was given illustrated and written instructions.  Discussed preventative sleeve and gave pt info/script to [facility]   She will call to get an appt.    Person(s) Educated Patient    Methods Explanation;Handout    Comprehension Returned demonstration;Verbalized understanding               PT Long Term Goals - 12/03/20 1330      PT LONG TERM GOAL #1   Title Patient will demonstrate she has regained full shoulder ROM and function post op compared to baseline assessments.    Time 4    Period Weeks    Status On-going    Target Date 12/31/20      PT LONG TERM GOAL #2   Title Patient will increase left shoulder flexion AROM to >/= 140 degrees for increased ease reaching overhead.    Baseline 42 post op / 151 pre-op    Time 4    Period Weeks    Status New    Target Date 12/31/20      PT LONG TERM GOAL #3   Title Patient will increase left shoulder AROM abduction to >/= 150 degrees for increased ability to obtain radiation positioning.    Baseline 64 degrees post op / 167 pre-op    Time 4    Period Weeks    Status New    Target Date 12/31/20      PT LONG TERM GOAL #4   Title Patient will improve her DASH score to </= 15 for improved overall UE function.    Baseline 4.55 pre-op; 75 post op    Time 4    Period Weeks    Status New    Target Date 12/31/20      PT LONG TERM GOAL #5   Title Patient will verbalize understnading of lymphedema risk reduction practices.    Time 4    Period Weeks    Status New    Target Date 12/31/20                 Plan - 12/07/20 1101    Clinical Impression Statement Pts drains were removed on Friday and she started doing her exercises on Saturday.  Therapy today consisted of AAROM to warm up pt in addition to progression to wand exercises and they were more comfortable for pt and she had better results. There is tender cording noted at axillary region and left upper arm.  MFR was performed to left axillary area of cording and PROM was performed to left shoulder with VC's to relax.  Pt was given script to go to [facility] and get a prophylactic  sleeve.  She tolerated therapy well today and had good improvement in PROM of left shoulder    Stability/Clinical Decision Making Stable/Uncomplicated    Clinical Decision Making Low  Rehab Potential Excellent    PT Frequency 2x / week    PT Duration 4 weeks    PT Treatment/Interventions ADLs/Self Care Home Management;Therapeutic exercise;Patient/family education;Manual techniques;Manual lymph drainage;Passive range of motion;Scar mobilization    PT Next Visit Plan Cont AAROM, PROM, STM to pectoralis/axillary region prn, MFR to cording Pt has script for [facility] to get prophylactic sleeve.    PT Home Exercise Plan Post op shoulder ROM HEP, supine wand flex and scaption    Consulted and Agree with Plan of Care Patient           Patient will benefit from skilled therapeutic intervention in order to improve the following deficits and impairments:  Postural dysfunction,Decreased knowledge of precautions,Impaired UE functional use,Pain,Decreased range of motion,Decreased scar mobility  Visit Diagnosis: Malignant neoplasm of upper-inner quadrant of left breast in female, estrogen receptor positive (HCC)  Abnormal posture  Stiffness of left shoulder, not elsewhere classified     Problem List Patient Active Problem List   Diagnosis Date Noted  . Breast cancer metastasized to axillary lymph node, left ([place]) 11/09/2020  . Port-A-Cath in place 07/02/2020  . Genetic testing 06/29/2020  . Malignant neoplasm of upper-inner quadrant of left breast in female, estrogen receptor positive ([place]) 06/05/2020  . Post-operative state 12/20/2016  . Fibroids 12/06/2016  . Pelvic pain in female 12/06/2016  . Diabetes mellitus ([place]) 06/19/2012    Claris Pong 12/07/2020, 11:10 AM  [place], [place], [address] Phone: [phone]   Fax:  [phone]  Name: Robin Kennedy MRN: [id number] Date of Birth:  23-Aug-1975 Robin Kennedy, PT 12/07/20 11:12 AM

## 2020-12-07 NOTE — Patient Instructions (Signed)
SHOULDER: Flexion - Supine (Cane)        Cancer Rehab [phone]   1) hands shoulder width apart;  5 reps with 5-10 sec hold 2) hands slightly wider apart, bring arms overhead  in V position  Shoulder Blade Stretch    Clasp fingers behind head with elbows touching in front of face. Pull elbows back while pressing shoulder blades together. Relax and hold as tolerated, can place pillow under elbow here for comfort as needed and to allow for prolonged stretch.  Repeat __5__ times. Do __1-2__ sessions per day.     SHOULDER: External Rotation - Supine (Cane)    Hold cane with both hands. Rotate left arm away from body. Keep elbow on pillow and next to body. _5__ reps per set, hold 5 seconds, _2__ sets per day. Keep elbow at a 90 degree angle.  Copyright  VHI. All rights reserved.

## 2020-12-11 ENCOUNTER — Other Ambulatory Visit: Payer: Self-pay

## 2020-12-11 ENCOUNTER — Ambulatory Visit: Payer: 59

## 2020-12-11 DIAGNOSIS — C50212 Malignant neoplasm of upper-inner quadrant of left female breast: Secondary | ICD-10-CM

## 2020-12-11 DIAGNOSIS — Z170 Estrogen receptor positive status [ER+]: Secondary | ICD-10-CM

## 2020-12-11 DIAGNOSIS — R293 Abnormal posture: Secondary | ICD-10-CM

## 2020-12-11 DIAGNOSIS — M25612 Stiffness of left shoulder, not elsewhere classified: Secondary | ICD-10-CM

## 2020-12-11 NOTE — Therapy (Signed)
[place], [place], [address] Phone: [phone]   Fax:  [phone]  Physical Therapy Treatment  Patient Details  Name: Robin Kennedy MRN: [id number] Date of Birth: 03-Dec-1975 Referring Provider (PT): Dr. [name]   Encounter Date: 12/11/2020   PT End of Session - 12/11/20 0906    Visit Number 4    Number of Visits 10    Date for PT Re-Evaluation 12/31/20    PT Start Time 0804    PT Stop Time 0904    PT Time Calculation (min) 60 min    Activity Tolerance Patient tolerated treatment well    Behavior During Therapy [facility] for tasks assessed/performed           Past Medical History:  Diagnosis Date  . Diabetes mellitus without complication ([place])   . Personal history of chemotherapy   . SVD (spontaneous vaginal delivery)    X 3    Past Surgical History:  Procedure Laterality Date  . BREAST BIOPSY Left 06/02/2020   x2  . BREAST BIOPSY Right 06/22/2020  . CYSTOSCOPY N/A 12/20/2016   Procedure: CYSTOSCOPY;  Surgeon: Lavonia Drafts, MD;  Location: [facility];  Service: Gynecology;  Laterality: N/A;  . MODIFIED MASTECTOMY Left 11/09/2020   Procedure: LEFT MODIFIED MASTECTOMY WITH LYMPH NODE DISSECTION;  Surgeon: [name], MD;  Location: [place];  Service: General;  Laterality: Left;  . PORTACATH PLACEMENT Left 06/23/2020   Procedure: INSERTION PORT-A-CATH WITH ULTRASOUND GUIDANCE;  Surgeon: [name], MD;  Location: [place];  Service: General;  Laterality: Left;  . RADIOACTIVE SEED GUIDED EXCISIONAL BREAST BIOPSY Right 11/09/2020   Procedure: RIGHT BREAST SEED LOCALIZED EXCISIONAL BIOPSY;  Surgeon: [name], MD;  Location: [place];  Service: General;  Laterality: Right;  . ROBOTIC ASSISTED TOTAL HYSTERECTOMY WITH BILATERAL SALPINGO OOPHERECTOMY Bilateral 12/20/2016   Procedure: ROBOTIC ASSISTED TOTAL HYSTERECTOMY WITH  BILATERAL SALPINGO OOPHORECTOMY;  Surgeon: Lavonia Drafts, MD;  Location: [facility];  Service: Gynecology;  Laterality: Bilateral;  . TUBAL LIGATION    . WISDOM TOOTH EXTRACTION      There were no vitals filed for this visit.   Subjective Assessment - 12/11/20 0808    Subjective I've been doing the HEP stretches 2x/day and I can tell they are really helping. I can lift my arm higher now without pain. I did have some pain yesterday though and took a muscle relaxer for that and feel better today.    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 It is ER positive, PR negative, and HER2 positive with a Ki67 of 60%. She underwent neoadjuvant chemotherapy 06/25/2020-10/07/2020 followed by a right lumpectomy and a left mastectomy with an axillary lymph node dissection (19 negative nodes removed) on 11/09/2020. She has type II diabetes.    Patient Stated Goals Get my arm moving again    Currently in Pain? No/denies                             [facility] Adult PT Treatment/Exercise - 12/11/20 0001      Shoulder Exercises: Supine   Horizontal ABduction Strengthening;Both;10 reps   also did scaption into a "V"   Horizontal ABduction Limitations Supine over towel roll at T-spine returning therapist demo      Shoulder Exercises: Pulleys   Flexion 2 minutes    Flexion Limitations Tactile cues  to decrease Lt scapular compensation    Scaption 2 minutes    Scaption Limitations Pt able to better demonstrate less compensation with this after cuing      Manual Therapy   Myofascial Release to left axillary region, medial upper arm at area of cording    Passive ROM PROM left shoulder flex, abduction, D2 flexion, also abd with er to work on radiation postioning which we were able to achieve by end of session                  PT Education - 12/11/20 0905    Education Details Supine over towel roll for bil shoulder  A/ROM    Person(s) Educated Patient    Methods Explanation;Demonstration;Handout    Comprehension Verbalized understanding               PT Long Term Goals - 12/03/20 1330      PT LONG TERM GOAL #1   Title Patient will demonstrate she has regained full shoulder ROM and function post op compared to baseline assessments.    Time 4    Period Weeks    Status On-going    Target Date 12/31/20      PT LONG TERM GOAL #2   Title Patient will increase left shoulder flexion AROM to >/= 140 degrees for increased ease reaching overhead.    Baseline 42 post op / 151 pre-op    Time 4    Period Weeks    Status New    Target Date 12/31/20      PT LONG TERM GOAL #3   Title Patient will increase left shoulder AROM abduction to >/= 150 degrees for increased ability to obtain radiation positioning.    Baseline 64 degrees post op / 167 pre-op    Time 4    Period Weeks    Status New    Target Date 12/31/20      PT LONG TERM GOAL #4   Title Patient will improve her DASH score to </= 15 for improved overall UE function.    Baseline 4.55 pre-op; 75 post op    Time 4    Period Weeks    Status New    Target Date 12/31/20      PT LONG TERM GOAL #5   Title Patient will verbalize understnading of lymphedema risk reduction practices.    Time 4    Period Weeks    Status New    Target Date 12/31/20                 Plan - 12/11/20 0906    Clinical Impression Statement Continued with manual therapy and AA/ROM working to increase end Lt shoulder AA/A/P/ROM. this is much better today per pt with less tightness felt, though cording still present and limiting end motions, just less so. She is doing well with HEP, encouraged her to incorporate more end ROM stretching into her day like in the doorways when she is leaving the room. She verbalized understanding and repots feeling good at end of session today.    Stability/Clinical Decision Making Stable/Uncomplicated    Rehab Potential Excellent     PT Frequency 2x / week    PT Duration 4 weeks    PT Treatment/Interventions ADLs/Self Care Home Management;Therapeutic exercise;Patient/family education;Manual techniques;Manual lymph drainage;Passive range of motion;Scar mobilization    PT Next Visit Plan Cont AAROM, PROM, STM to pectoralis/axillary region prn, MFR to cording    PT Home Exercise Plan  Post op shoulder ROM HEP, supine wand flex and scaption    Consulted and Agree with Plan of Care Patient           Patient will benefit from skilled therapeutic intervention in order to improve the following deficits and impairments:  Postural dysfunction,Decreased knowledge of precautions,Impaired UE functional use,Pain,Decreased range of motion,Decreased scar mobility  Visit Diagnosis: Malignant neoplasm of upper-inner quadrant of left breast in female, estrogen receptor positive (HCC)  Abnormal posture  Stiffness of left shoulder, not elsewhere classified     Problem List Patient Active Problem List   Diagnosis Date Noted  . Breast cancer metastasized to axillary lymph node, left ([place]) 11/09/2020  . Port-A-Cath in place 07/02/2020  . Genetic testing 06/29/2020  . Malignant neoplasm of upper-inner quadrant of left breast in female, estrogen receptor positive ([place]) 06/05/2020  . Post-operative state 12/20/2016  . Fibroids 12/06/2016  . Pelvic pain in female 12/06/2016  . Diabetes mellitus ([place]) 06/19/2012    Otelia Limes, PTA 12/11/2020, 9:08 AM  [place], [place], [address] Phone: [phone]   Fax:  [phone]  Name: Robin Kennedy MRN: [id number] Date of Birth: 1976-05-16

## 2020-12-11 NOTE — Patient Instructions (Signed)
Laying on floor with knees bent over towel roll along your mid back between shoulder blades for the following:  1. Start with hands together pointing at ceiling, open arms wide towards floor and then slowly bring back together. Repeat for 10 reps  2. Start with hands together over thighs and bring them up "high and wide" making a "V". Repeat for 10 reps  3. Make a "snow angel" starting with hands on floor by sides and palms up, then slowly bring arms out to sides, keeping them on the floor, until stretch felt at chest. Hold while taking deep breaths until muscle relaxes, then try to bring arms out a little wider.  Can do this 3-5 mins   Cancer Rehab [phone]

## 2020-12-14 ENCOUNTER — Ambulatory Visit: Payer: 59

## 2020-12-14 ENCOUNTER — Encounter: Payer: Self-pay | Admitting: *Deleted

## 2020-12-14 ENCOUNTER — Other Ambulatory Visit: Payer: Self-pay

## 2020-12-14 DIAGNOSIS — R293 Abnormal posture: Secondary | ICD-10-CM | POA: Diagnosis present

## 2020-12-14 DIAGNOSIS — C50212 Malignant neoplasm of upper-inner quadrant of left female breast: Secondary | ICD-10-CM

## 2020-12-14 DIAGNOSIS — M25612 Stiffness of left shoulder, not elsewhere classified: Secondary | ICD-10-CM | POA: Insufficient documentation

## 2020-12-14 DIAGNOSIS — Z170 Estrogen receptor positive status [ER+]: Secondary | ICD-10-CM | POA: Insufficient documentation

## 2020-12-14 NOTE — Therapy (Signed)
[place], [place], [address] Phone: [phone]   Fax:  [phone]  Physical Therapy Treatment  Patient Details  Name: Robin Kennedy MRN: [id number] Date of Birth: 05-31-1976 Referring Provider (PT): Dr. [name]   Encounter Date: 12/14/2020   PT End of Session - 12/14/20 1606    Visit Number 5    Number of Visits 10    Date for PT Re-Evaluation 12/31/20    PT Start Time 1509    PT Stop Time 1606    PT Time Calculation (min) 57 min    Activity Tolerance Patient tolerated treatment well    Behavior During Therapy [facility] for tasks assessed/performed           Past Medical History:  Diagnosis Date  . Diabetes mellitus without complication ([place])   . Personal history of chemotherapy   . SVD (spontaneous vaginal delivery)    X 3    Past Surgical History:  Procedure Laterality Date  . BREAST BIOPSY Left 06/02/2020   x2  . BREAST BIOPSY Right 06/22/2020  . CYSTOSCOPY N/A 12/20/2016   Procedure: CYSTOSCOPY;  Surgeon: Lavonia Drafts, MD;  Location: [facility];  Service: Gynecology;  Laterality: N/A;  . MODIFIED MASTECTOMY Left 11/09/2020   Procedure: LEFT MODIFIED MASTECTOMY WITH LYMPH NODE DISSECTION;  Surgeon: [name], MD;  Location: [facility];  Service: General;  Laterality: Left;  . PORTACATH PLACEMENT Left 06/23/2020   Procedure: INSERTION PORT-A-CATH WITH ULTRASOUND GUIDANCE;  Surgeon: [name], MD;  Location: [place];  Service: General;  Laterality: Left;  . RADIOACTIVE SEED GUIDED EXCISIONAL BREAST BIOPSY Right 11/09/2020   Procedure: RIGHT BREAST SEED LOCALIZED EXCISIONAL BIOPSY;  Surgeon: [name], MD;  Location: [place];  Service: General;  Laterality: Right;  . ROBOTIC ASSISTED TOTAL HYSTERECTOMY WITH BILATERAL SALPINGO OOPHERECTOMY Bilateral 12/20/2016   Procedure: ROBOTIC ASSISTED TOTAL HYSTERECTOMY WITH  BILATERAL SALPINGO OOPHORECTOMY;  Surgeon: Lavonia Drafts, MD;  Location: [facility];  Service: Gynecology;  Laterality: Bilateral;  . TUBAL LIGATION    . WISDOM TOOTH EXTRACTION      There were no vitals filed for this visit.   Subjective Assessment - 12/14/20 1524    Subjective The ABC class was good this morning but I do have some follow up questions. I got my compression bras (3) Friday and have been wearing them ever since. They are so comfortable!    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 It is ER positive, PR negative, and HER2 positive with a Ki67 of 60%. She underwent neoadjuvant chemotherapy 06/25/2020-10/07/2020 followed by a right lumpectomy and a left mastectomy with an axillary lymph node dissection (19 negative nodes removed) on 11/09/2020. She has type II diabetes.    Patient Stated Goals Get my arm moving again    Currently in Pain? No/denies                             [facility] Adult PT Treatment/Exercise - 12/14/20 0001      Shoulder Exercises: Pulleys   Flexion 2 minutes    Flexion Limitations VCs to decrease Lt scapular compensation which pt is able to do with cuing, but reqiures multiple cues as she is not completely aware of this, however this did improve by end of time    Scaption 2 minutes    Scaption Limitations VCs same  as with flexion      Shoulder Exercises: Therapy Ball   Flexion Both;10 reps   forward lean into end of stretch   Flexion Limitations Pt returned therapist demo    ABduction Left;10 reps   same side lean into end of stretch   ABduction Limitations returned therapist demo      Shoulder Exercises: Stretch   Corner Stretch 3 reps;20 seconds      Manual Therapy   Myofascial Release to left axillary region where cording only mildly palpable today    Passive ROM PROM left shoulder flex, abduction, D2 flexion, also abd with er to work on radiation  postioning which was easily attainable today                       PT Long Term Goals - 12/03/20 1330      PT LONG TERM GOAL #1   Title Patient will demonstrate she has regained full shoulder ROM and function post op compared to baseline assessments.    Time 4    Period Weeks    Status On-going    Target Date 12/31/20      PT LONG TERM GOAL #2   Title Patient will increase left shoulder flexion AROM to >/= 140 degrees for increased ease reaching overhead.    Baseline 42 post op / 151 pre-op    Time 4    Period Weeks    Status New    Target Date 12/31/20      PT LONG TERM GOAL #3   Title Patient will increase left shoulder AROM abduction to >/= 150 degrees for increased ability to obtain radiation positioning.    Baseline 64 degrees post op / 167 pre-op    Time 4    Period Weeks    Status New    Target Date 12/31/20      PT LONG TERM GOAL #4   Title Patient will improve her DASH score to </= 15 for improved overall UE function.    Baseline 4.55 pre-op; 75 post op    Time 4    Period Weeks    Status New    Target Date 12/31/20      PT LONG TERM GOAL #5   Title Patient will verbalize understnading of lymphedema risk reduction practices.    Time 4    Period Weeks    Status New    Target Date 12/31/20                 Plan - 12/14/20 1719    Clinical Impression Statement Continued warming up with AA/ROM which did well with. Then continued with manual therapy focusing on decreasing fascial retstrictions at her Lt axilla limiting her end P/ROM, though this was much improved today and cording was much less palpable. Pt also reports noting good improvement with her stretching with no discomfot now in upper arm from cording that she could feel last week.    Stability/Clinical Decision Making Stable/Uncomplicated    Rehab Potential Excellent    PT Frequency 2x / week    PT Duration 4 weeks    PT Treatment/Interventions ADLs/Self Care Home  Management;Therapeutic exercise;Patient/family education;Manual techniques;Manual lymph drainage;Passive range of motion;Scar mobilization    PT Next Visit Plan Cont AAROM, PROM, STM to pectoralis/axillary region prn, MFR to cording; try adding supine scapular series?    PT Home Exercise Plan Post op shoulder ROM HEP, supine wand flex and scaption  Consulted and Agree with Plan of Care Patient           Patient will benefit from skilled therapeutic intervention in order to improve the following deficits and impairments:  Postural dysfunction,Decreased knowledge of precautions,Impaired UE functional use,Pain,Decreased range of motion,Decreased scar mobility  Visit Diagnosis: Malignant neoplasm of upper-inner quadrant of left breast in female, estrogen receptor positive (HCC)  Abnormal posture  Stiffness of left shoulder, not elsewhere classified     Problem List Patient Active Problem List   Diagnosis Date Noted  . Breast cancer metastasized to axillary lymph node, left ([place]) 11/09/2020  . Port-A-Cath in place 07/02/2020  . Genetic testing 06/29/2020  . Malignant neoplasm of upper-inner quadrant of left breast in female, estrogen receptor positive ([place]) 06/05/2020  . Post-operative state 12/20/2016  . Fibroids 12/06/2016  . Pelvic pain in female 12/06/2016  . Diabetes mellitus ([place]) 06/19/2012    Otelia Limes, PTA 12/14/2020, 5:28 PM  [place], [place], [address] Phone: [phone]   Fax:  [phone]  Name: Briana Farner MRN: [id number] Date of Birth: November 19, 1975

## 2020-12-15 ENCOUNTER — Telehealth: Payer: Self-pay

## 2020-12-15 ENCOUNTER — Other Ambulatory Visit: Payer: Self-pay

## 2020-12-15 DIAGNOSIS — C50212 Malignant neoplasm of upper-inner quadrant of left female breast: Secondary | ICD-10-CM

## 2020-12-15 DIAGNOSIS — Z170 Estrogen receptor positive status [ER+]: Secondary | ICD-10-CM

## 2020-12-15 NOTE — Assessment & Plan Note (Signed)
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 Right breast benign-appearing calcifications previous stereotactic biopsy was attempted but it was too superficial and felt to be benign.  Recommendationbased on multidisciplinary tumor board: 1. Neoadjuvant chemotherapy with TCH Perjeta 6 cycles followed by Herceptin Perjeta maintenance for 1 year 2. 11/09/20: Right lumpectomy and left mastectomy [name]): Right breast: no evidence of malignancy Left breast: no residual carcinoma, with 19 left axillary lymph nodes negative for carcinoma. 3. Followed by adjuvant radiation therapy (due to inflammatory breast changes at diagnosis) ------------------------------------------------------------------------------------------------------------------------------------------ Pathology counseling: I discussed the final pathology report of the patient provided  a copy of this report. I discussed the margins as well as lymph node surgeries. We also discussed the final staging along with previously performed ER/PR and HER-2/neu testing.  10/16/2020: CT CAP: Decrease in cutaneous thickening.  Decrease in size of left axillary, subpectoral and supraclavicular lymph nodes, no distant metastatic disease.  Treatment plan: Herceptin Perjeta maintenance, referral to radiation She plans to go to [place] for her daughter's graduation ceremony May 14. Return to clinic every 3 weeks for Herceptin Perjeta and every 6 weeks with labs and follow-up with me.

## 2020-12-15 NOTE — Telephone Encounter (Signed)
RN left message on secure voicemail regarding appointment time and date for upcoming echocardiogram.

## 2020-12-15 NOTE — Progress Notes (Signed)
Patient Care Team: Shawnee Knapp, MD as PCP - General (Family Medicine) Shawnee Knapp, MD (Family Medicine) Mauro Kaufmann, RN as Oncology Nurse Navigator Rockwell Germany, RN as Oncology Nurse Navigator [name], MD as Consulting Physician (General Surgery) Nicholas Lose, MD as Consulting Physician (Hematology and Oncology) Eppie Gibson, MD as Attending Physician (Radiation Oncology)  DIAGNOSIS:    ICD-10-CM   1. Malignant neoplasm of upper-inner quadrant of left breast in female, estrogen receptor positive ([name])  C50.212    Z17.0     SUMMARY OF ONCOLOGIC HISTORY: Oncology History  Malignant neoplasm of upper-inner quadrant of left breast in female, estrogen receptor positive ([name])  06/05/2020 Initial Diagnosis   06/05/2020: Enlarging left breast with pain and diffuse skin thickening: Mammogram revealed 3.5 cm mass 11 o'clock position with 3 abnormal lymph nodes: 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 Right breast benign-appearing calcifications previous stereotactic biopsy was attempted but it was too superficial and felt to be benign.   06/25/2020 - 10/09/2020 Chemotherapy   Neoadjuvant chemotherapy with TCH Perjeta 6 cycles followed by Herceptin Perjeta maintenance for 1 year       06/29/2020 Genetic Testing   Negative genetic testing: no pathogenic variants detected in [facility] Common Hereditary Cancers Panel.  The report date is June 29, 2020.   The Common Hereditary Cancers Panel offered by [facility] includes sequencing and/or deletion duplication testing of the following 48 genes: APC, ATM, AXIN2, BARD1, BMPR1A, BRCA1, BRCA2, BRIP1, CDH1, CDK4, CDKN2A (p14ARF), CDKN2A (p16INK4a), CHEK2, CTNNA1, DICER1, EPCAM (Deletion/duplication testing only), GREM1 (promoter region deletion/duplication testing only), KIT, MEN1, MLH1, MSH2, MSH3, MSH6, MUTYH, NBN, NF1, NHTL1, PALB2, PDGFRA, PMS2, POLD1, POLE, PTEN, RAD50, RAD51C, RAD51D,  RNF43, SDHB, SDHC, SDHD, SMAD4, SMARCA4. STK11, TP53, TSC1, TSC2, and VHL.  The following genes were evaluated for sequence changes only: SDHA and HOXB13 c.251G>A variant only.   10/28/2020 -  Chemotherapy      Patient is on Antibody Plan: BREAST TRASTUZUMAB + PERTUZUMAB Q21D    11/09/2020 Surgery   Right lumpectomy and left mastectomy [name]):  Right breast: no evidence of malignancy Left breast: no residual carcinoma, with 19 left axillary lymph nodes negative for carcinoma.     CHIEF COMPLIANT: Follow-up s/p right lumpectomy and left mastectomy   INTERVAL HISTORY: Robin Kennedy is a 45 y.o. with above-mentioned history of inflammatory left breast who completed neoadjuvant chemotherapy, right lumpectomy and left mastectomy, and is currently on Herceptin Perjeta maintenance. She presents to the clinic todayto discuss the pathology report and further treatment.  ALLERGIES:  has No Known Allergies.  MEDICATIONS:  Current Outpatient Medications  Medication Sig Dispense Refill  . glucose blood (ONE TOUCH ULTRA TEST) test strip Use to check cbgs qd 100 each 11  . lidocaine-prilocaine (EMLA) cream Apply 1 application topically as needed. 30 g 1  . metFORMIN (GLUCOPHAGE-XR) 500 MG 24 hr tablet Take 1,000 mg by mouth 2 (two) times daily.  3  . methocarbamol (ROBAXIN) 500 MG tablet Take 1 tablet (500 mg total) by mouth every 6 (six) hours as needed for muscle spasms. 20 tablet 1  . oxyCODONE (OXY IR/ROXICODONE) 5 MG immediate release tablet Take 1 tablet (5 mg total) by mouth every 6 (six) hours as needed for severe pain. 15 tablet 0  . potassium chloride SA (KLOR-CON) 20 MEQ tablet Take 1 tablet (20 mEq total) by mouth daily. 30 tablet 0  . TRESIBA FLEXTOUCH 100 UNIT/ML FlexTouch  Pen Inject 10 Units into the skin daily after breakfast.     No current facility-administered medications for this visit.    PHYSICAL EXAMINATION: ECOG PERFORMANCE STATUS: 1 - Symptomatic but completely  ambulatory  There were no vitals filed for this visit. There were no vitals filed for this visit.  LABORATORY DATA:  I have reviewed the data as listed CMP Latest Ref Rng & Units 10/28/2020 10/07/2020 09/16/2020  Glucose 70 - 99 mg/dL 216(H) 296(H) 163(H)  BUN 6 - 20 mg/dL '9 9 8  ' Creatinine 0.44 - 1.00 mg/dL 0.79 0.80 0.82  Sodium 135 - 145 mmol/L 139 140 143  Potassium 3.5 - 5.1 mmol/L 3.2(L) 3.6 3.4(L)  Chloride 98 - 111 mmol/L 108 106 108  CO2 22 - 32 mmol/L '23 26 28  ' Calcium 8.9 - 10.3 mg/dL 8.7(L) 9.0 9.1  Total Protein 6.5 - 8.1 g/dL 6.2(L) 6.5 6.5  Total Bilirubin 0.3 - 1.2 mg/dL 0.4 0.5 0.5  Alkaline Phos 38 - 126 U/L 51 53 59  AST 15 - 41 U/L 10(L) 10(L) 13(L)  ALT 0 - 44 U/L '8 9 11    ' Lab Results  Component Value Date   WBC 5.3 12/16/2020   HGB 9.5 (L) 12/16/2020   HCT 30.3 (L) 12/16/2020   MCV 89.6 12/16/2020   PLT 228 12/16/2020   NEUTROABS 2.2 12/16/2020    ASSESSMENT & PLAN:  Malignant neoplasm of upper-inner quadrant of left breast in female, estrogen receptor positive ([name]) 06/05/2020: Enlarging left breast with pain and diffuse skin thickening: Mammogram revealed 3.5 cm mass 11 o'clock position with 3 abnormal lymph nodes: 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 Right breast benign-appearing calcifications previous stereotactic biopsy was attempted but it was too superficial and felt to be benign.  Recommendationbased on multidisciplinary tumor board: 1. Neoadjuvant chemotherapy with TCH Perjeta 6 cycles followed by Herceptin Perjeta maintenance for 1 year 2. 11/09/20: Right lumpectomy and left mastectomy [name]): Right breast: no evidence of malignancy Left breast: no residual carcinoma, with 19 left axillary lymph nodes negative for carcinoma. 3. Followed by adjuvant radiation therapy (due to inflammatory breast changes at  diagnosis) ------------------------------------------------------------------------------------------------------------------------------------------ Pathology counseling: I discussed the final pathology report of the patient provided  a copy of this report. I discussed the margins as well as lymph node surgeries. We also discussed the final staging along with previously performed ER/PR and HER-2/neu testing.  10/16/2020: CT CAP: Decrease in cutaneous thickening.  Decrease in size of left axillary, subpectoral and supraclavicular lymph nodes, no distant metastatic disease.  Treatment plan: Herceptin Perjeta maintenance, referral to radiation She plans to go to [place] for her daughter's graduation ceremony May 14. Return to clinic every 3 weeks for Herceptin Perjeta and every 6 weeks with labs and follow-up with me.     No orders of the defined types were placed in this encounter.  The patient has a good understanding of the overall plan. she agrees with it. she will call with any problems that may develop before the next visit here.  Total time spent: 30 mins including face to face time and time spent for planning, charting and coordination of care  Rulon Eisenmenger, MD, MPH 12/16/2020  I, Cloyde Reams Dorshimer, am acting as scribe for Dr. Nicholas Lose.  I have reviewed the above documentation for accuracy and completeness, and I agree with the above.

## 2020-12-16 ENCOUNTER — Inpatient Hospital Stay (HOSPITAL_BASED_OUTPATIENT_CLINIC_OR_DEPARTMENT_OTHER): Payer: 59 | Admitting: Hematology and Oncology

## 2020-12-16 ENCOUNTER — Other Ambulatory Visit: Payer: 59

## 2020-12-16 ENCOUNTER — Inpatient Hospital Stay: Payer: 59

## 2020-12-16 ENCOUNTER — Other Ambulatory Visit: Payer: Self-pay

## 2020-12-16 VITALS — BP 128/81 | HR 92 | Temp 98.7°F | Resp 16

## 2020-12-16 DIAGNOSIS — Z170 Estrogen receptor positive status [ER+]: Secondary | ICD-10-CM

## 2020-12-16 DIAGNOSIS — Z9012 Acquired absence of left breast and nipple: Secondary | ICD-10-CM | POA: Insufficient documentation

## 2020-12-16 DIAGNOSIS — Z510 Encounter for antineoplastic radiation therapy: Secondary | ICD-10-CM | POA: Insufficient documentation

## 2020-12-16 DIAGNOSIS — Z79899 Other long term (current) drug therapy: Secondary | ICD-10-CM | POA: Insufficient documentation

## 2020-12-16 DIAGNOSIS — C50212 Malignant neoplasm of upper-inner quadrant of left female breast: Secondary | ICD-10-CM

## 2020-12-16 DIAGNOSIS — Z5112 Encounter for antineoplastic immunotherapy: Secondary | ICD-10-CM | POA: Insufficient documentation

## 2020-12-16 DIAGNOSIS — Z9221 Personal history of antineoplastic chemotherapy: Secondary | ICD-10-CM | POA: Insufficient documentation

## 2020-12-16 DIAGNOSIS — Z95828 Presence of other vascular implants and grafts: Secondary | ICD-10-CM

## 2020-12-16 LAB — CBC WITH DIFFERENTIAL (CANCER CENTER ONLY)
Abs Immature Granulocytes: 0.01 10*3/uL (ref 0.00–0.07)
Basophils Absolute: 0 10*3/uL (ref 0.0–0.1)
Basophils Relative: 0 %
Eosinophils Absolute: 0.1 10*3/uL (ref 0.0–0.5)
Eosinophils Relative: 2 %
HCT: 30.3 % — ABNORMAL LOW (ref 36.0–46.0)
Hemoglobin: 9.5 g/dL — ABNORMAL LOW (ref 12.0–15.0)
Immature Granulocytes: 0 %
Lymphocytes Relative: 51 %
Lymphs Abs: 2.7 10*3/uL (ref 0.7–4.0)
MCH: 28.1 pg (ref 26.0–34.0)
MCHC: 31.4 g/dL (ref 30.0–36.0)
MCV: 89.6 fL (ref 80.0–100.0)
Monocytes Absolute: 0.3 10*3/uL (ref 0.1–1.0)
Monocytes Relative: 6 %
Neutro Abs: 2.2 10*3/uL (ref 1.7–7.7)
Neutrophils Relative %: 41 %
Platelet Count: 228 10*3/uL (ref 150–400)
RBC: 3.38 MIL/uL — ABNORMAL LOW (ref 3.87–5.11)
RDW: 13.7 % (ref 11.5–15.5)
WBC Count: 5.3 10*3/uL (ref 4.0–10.5)
nRBC: 0 % (ref 0.0–0.2)

## 2020-12-16 LAB — CMP (CANCER CENTER ONLY)
ALT: 8 U/L (ref 0–44)
AST: 9 U/L — ABNORMAL LOW (ref 15–41)
Albumin: 3.7 g/dL (ref 3.5–5.0)
Alkaline Phosphatase: 47 U/L (ref 38–126)
Anion gap: 9 (ref 5–15)
BUN: 10 mg/dL (ref 6–20)
CO2: 27 mmol/L (ref 22–32)
Calcium: 9.6 mg/dL (ref 8.9–10.3)
Chloride: 108 mmol/L (ref 98–111)
Creatinine: 0.74 mg/dL (ref 0.44–1.00)
GFR, Estimated: >60 mL/min (ref >60)
Glucose, Bld: 157 mg/dL — ABNORMAL HIGH (ref 70–99)
Potassium: 3.8 mmol/L (ref 3.5–5.1)
Sodium: 144 mmol/L (ref 135–145)
Total Bilirubin: 0.4 mg/dL (ref 0.3–1.2)
Total Protein: 6.8 g/dL (ref 6.5–8.1)

## 2020-12-16 MED ORDER — HEPARIN SOD (PORK) LOCK FLUSH 100 UNIT/ML IV SOLN
500.0000 [IU] | Freq: Once | INTRAVENOUS | Status: AC | PRN
  Administered 2020-12-16: 500 [IU]
  Filled 2020-12-16: qty 5

## 2020-12-16 MED ORDER — SODIUM CHLORIDE 0.9 % IV SOLN
420.0000 mg | Freq: Once | INTRAVENOUS | Status: AC
  Administered 2020-12-16: 420 mg via INTRAVENOUS
  Filled 2020-12-16: qty 14

## 2020-12-16 MED ORDER — DIPHENHYDRAMINE HCL 25 MG PO CAPS
50.0000 mg | ORAL_CAPSULE | Freq: Once | ORAL | Status: AC
  Administered 2020-12-16: 50 mg via ORAL

## 2020-12-16 MED ORDER — SODIUM CHLORIDE 0.9% FLUSH
10.0000 mL | Freq: Once | INTRAVENOUS | Status: AC
  Administered 2020-12-16: 10 mL
  Filled 2020-12-16: qty 10

## 2020-12-16 MED ORDER — SODIUM CHLORIDE 0.9 % IV SOLN
6.0000 mg/kg | Freq: Once | INTRAVENOUS | Status: AC
  Administered 2020-12-16: 525 mg via INTRAVENOUS
  Filled 2020-12-16: qty 25

## 2020-12-16 MED ORDER — ACETAMINOPHEN 325 MG PO TABS
650.0000 mg | ORAL_TABLET | Freq: Once | ORAL | Status: AC
  Administered 2020-12-16: 650 mg via ORAL

## 2020-12-16 MED ORDER — ACETAMINOPHEN 325 MG PO TABS
ORAL_TABLET | ORAL | Status: AC
  Filled 2020-12-16: qty 2

## 2020-12-16 MED ORDER — SODIUM CHLORIDE 0.9% FLUSH
10.0000 mL | INTRAVENOUS | Status: DC | PRN
  Administered 2020-12-16: 10 mL
  Filled 2020-12-16: qty 10

## 2020-12-16 MED ORDER — DIPHENHYDRAMINE HCL 25 MG PO CAPS
ORAL_CAPSULE | ORAL | Status: AC
  Filled 2020-12-16: qty 2

## 2020-12-16 MED ORDER — SODIUM CHLORIDE 0.9 % IV SOLN
Freq: Once | INTRAVENOUS | Status: AC
  Filled 2020-12-16: qty 250

## 2020-12-16 NOTE — Patient Instructions (Signed)
[facility] Discharge Instructions for Patients Receiving Chemotherapy  Today you received the following chemotherapy agents herceptin, perjeta   To help prevent nausea and vomiting after your treatment, we encourage you to take your nausea medication as directed.   If you develop nausea and vomiting that is not controlled by your nausea medication, call the clinic.   BELOW ARE SYMPTOMS THAT SHOULD BE REPORTED IMMEDIATELY:  *FEVER GREATER THAN 100.5 F  *CHILLS WITH OR WITHOUT FEVER  NAUSEA AND VOMITING THAT IS NOT CONTROLLED WITH YOUR NAUSEA MEDICATION  *UNUSUAL SHORTNESS OF BREATH  *UNUSUAL BRUISING OR BLEEDING  TENDERNESS IN MOUTH AND THROAT WITH OR WITHOUT PRESENCE OF ULCERS  *URINARY PROBLEMS  *BOWEL PROBLEMS  UNUSUAL RASH Items with * indicate a potential emergency and should be followed up as soon as possible.  Feel free to call the clinic should you have any questions or concerns. The clinic phone number is [phone].  Please show the CHEMO ALERT CARD at check-in to the Emergency Department and triage nurse.   

## 2020-12-17 ENCOUNTER — Ambulatory Visit: Payer: 59

## 2020-12-17 DIAGNOSIS — C50212 Malignant neoplasm of upper-inner quadrant of left female breast: Secondary | ICD-10-CM

## 2020-12-17 DIAGNOSIS — Z170 Estrogen receptor positive status [ER+]: Secondary | ICD-10-CM

## 2020-12-17 DIAGNOSIS — R293 Abnormal posture: Secondary | ICD-10-CM

## 2020-12-17 DIAGNOSIS — M25612 Stiffness of left shoulder, not elsewhere classified: Secondary | ICD-10-CM

## 2020-12-17 NOTE — Therapy (Signed)
[place], [place], [address] Phone: [phone]   Fax:  [phone]  Physical Therapy Treatment  Patient Details  Name: Robin Kennedy MRN: [id number] Date of Birth: 10/21/1975 Referring Provider (PT): Dr. [name]   Encounter Date: 12/17/2020   PT End of Session - 12/17/20 0915    Visit Number 6    Number of Visits 10    Date for PT Re-Evaluation 12/31/20    PT Start Time 0900    PT Stop Time 0957    PT Time Calculation (min) 57 min    Activity Tolerance Patient tolerated treatment well    Behavior During Therapy [facility] for tasks assessed/performed           Past Medical History:  Diagnosis Date  . Diabetes mellitus without complication ([place])   . Personal history of chemotherapy   . SVD (spontaneous vaginal delivery)    X 3    Past Surgical History:  Procedure Laterality Date  . BREAST BIOPSY Left 06/02/2020   x2  . BREAST BIOPSY Right 06/22/2020  . CYSTOSCOPY N/A 12/20/2016   Procedure: CYSTOSCOPY;  Surgeon: Lavonia Drafts, MD;  Location: [facility];  Service: Gynecology;  Laterality: N/A;  . MODIFIED MASTECTOMY Left 11/09/2020   Procedure: LEFT MODIFIED MASTECTOMY WITH LYMPH NODE DISSECTION;  Surgeon: [name], MD;  Location: [facility];  Service: General;  Laterality: Left;  . PORTACATH PLACEMENT Left 06/23/2020   Procedure: INSERTION PORT-A-CATH WITH ULTRASOUND GUIDANCE;  Surgeon: [name], MD;  Location: [facility];  Service: General;  Laterality: Left;  . RADIOACTIVE SEED GUIDED EXCISIONAL BREAST BIOPSY Right 11/09/2020   Procedure: RIGHT BREAST SEED LOCALIZED EXCISIONAL BIOPSY;  Surgeon: [name], MD;  Location: [facility];  Service: General;  Laterality: Right;  . ROBOTIC ASSISTED TOTAL HYSTERECTOMY WITH BILATERAL SALPINGO OOPHERECTOMY Bilateral 12/20/2016   Procedure: ROBOTIC ASSISTED TOTAL HYSTERECTOMY WITH  BILATERAL SALPINGO OOPHORECTOMY;  Surgeon: Lavonia Drafts, MD;  Location: [facility];  Service: Gynecology;  Laterality: Bilateral;  . TUBAL LIGATION    . WISDOM TOOTH EXTRACTION      There were no vitals filed for this visit.   Subjective Assessment - 12/17/20 0908    Subjective I felt pretty good after my last session. I had another infusion yesterday though so I'm tired today and had some diarrhea early this morning but I took an immodium and that's better now.    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 It is ER positive, PR negative, and HER2 positive with a Ki67 of 60%. She underwent neoadjuvant chemotherapy 06/25/2020-10/07/2020 followed by a right lumpectomy and a left mastectomy with an axillary lymph node dissection (19 negative nodes removed) on 11/09/2020. She has type II diabetes.    Patient Stated Goals Get my arm moving again    Currently in Pain? No/denies                             [facility] Adult PT Treatment/Exercise - 12/17/20 0001      Shoulder Exercises: Supine   Horizontal ABduction Strengthening;Both;5 reps;Theraband    Theraband Level (Shoulder Horizontal ABduction) Level 1 (Yellow)    External Rotation Strengthening;Both;10 reps;Theraband    Theraband Level (Shoulder External Rotation) Level 1 (Yellow)    Flexion Strengthening;Both;5 reps;Theraband   Narrow and Wide Grip, x5 each   Theraband Level (Shoulder Flexion) Level  1 (Yellow)    Diagonals Strengthening;Right;Left;5 reps;Theraband    Theraband Level (Shoulder Diagonals) Level 1 (Yellow)      Shoulder Exercises: Pulleys   Flexion 2 minutes    Flexion Limitations VCs to remind pt to relax shoulders    Scaption 3 minutes    Scaption Limitations VCs to sit erect, including looking forward as pt was watching her arm to see how high she could get it but this was causing compensations      Shoulder Exercises: Therapy  Ball   Flexion Both;10 reps   forward lean into end of stretch   ABduction Left;10 reps   same side lean into of stretch     Manual Therapy   Myofascial Release to left axillary region where cording only mildly palpable today    Passive ROM PROM left shoulder flex, abduction, D2 flexion, also abd with er to work on radiation postioning                  PT Education - 12/17/20 0929    Education Details Supine scapular series with yellow theraband    Person(s) Educated Patient    Methods Explanation;Demonstration;Handout    Comprehension Verbalized understanding;Returned demonstration;Need further instruction               PT Long Term Goals - 12/03/20 1330      PT LONG TERM GOAL #1   Title Patient will demonstrate she has regained full shoulder ROM and function post op compared to baseline assessments.    Time 4    Period Weeks    Status On-going    Target Date 12/31/20      PT LONG TERM GOAL #2   Title Patient will increase left shoulder flexion AROM to >/= 140 degrees for increased ease reaching overhead.    Baseline 42 post op / 151 pre-op    Time 4    Period Weeks    Status New    Target Date 12/31/20      PT LONG TERM GOAL #3   Title Patient will increase left shoulder AROM abduction to >/= 150 degrees for increased ability to obtain radiation positioning.    Baseline 64 degrees post op / 167 pre-op    Time 4    Period Weeks    Status New    Target Date 12/31/20      PT LONG TERM GOAL #4   Title Patient will improve her DASH score to </= 15 for improved overall UE function.    Baseline 4.55 pre-op; 75 post op    Time 4    Period Weeks    Status New    Target Date 12/31/20      PT LONG TERM GOAL #5   Title Patient will verbalize understnading of lymphedema risk reduction practices.    Time 4    Period Weeks    Status New    Target Date 12/31/20                 Plan - 12/17/20 0915    Clinical Impression Statement Continued with  AA/ROM as pt is feeling a good stretch with this. Progressed HEP to include supine scapular series with yellow theraband which pt did well with though is some limited at end motions by tightness. Also continued with manual therapy working to continue to decrease fascial restrictions from scar tissue that are limiting her end ROMs. Overall her motion has improved and she will be able to  attain radiaiton position next week for her simulation on Monday.    Stability/Clinical Decision Making Stable/Uncomplicated    Rehab Potential Excellent    PT Frequency 2x / week    PT Duration 4 weeks    PT Treatment/Interventions ADLs/Self Care Home Management;Therapeutic exercise;Patient/family education;Manual techniques;Manual lymph drainage;Passive range of motion;Scar mobilization    PT Next Visit Plan Cont AAROM, PROM, STM to pectoralis/axillary region prn, MFR to cording; Review supine scapular series?    PT Home Exercise Plan Post op shoulder ROM HEP, supine wand flex and scaption, doorway stretches for end flexion and abduction; supine sacpular series with yellow theraband    Consulted and Agree with Plan of Care Patient           Patient will benefit from skilled therapeutic intervention in order to improve the following deficits and impairments:  Postural dysfunction,Decreased knowledge of precautions,Impaired UE functional use,Pain,Decreased range of motion,Decreased scar mobility  Visit Diagnosis: Malignant neoplasm of upper-inner quadrant of left breast in female, estrogen receptor positive ([facility])  Abnormal posture  Stiffness of left shoulder, not elsewhere classified     Problem List Patient Active Problem List   Diagnosis Date Noted  . Breast cancer metastasized to axillary lymph node, left ([place]) 11/09/2020  . Port-A-Cath in place 07/02/2020  . Genetic testing 06/29/2020  . Malignant neoplasm of upper-inner quadrant of left breast in female, estrogen receptor positive ([place]) 06/05/2020   . Post-operative state 12/20/2016  . Fibroids 12/06/2016  . Pelvic pain in female 12/06/2016  . Diabetes mellitus ([place]) 06/19/2012    Otelia Limes, PTA 12/17/2020, 9:58 AM  [place], [place], [address] Phone: [phone]   Fax:  [phone]  Name: Jawana Reagor MRN: [id number] Date of Birth: May 26, 1976

## 2020-12-17 NOTE — Patient Instructions (Signed)

## 2020-12-18 NOTE — Progress Notes (Signed)
Location of Breast Cancer: Malignant neoplasm of upper-inner quadrant of LEFT breast, estrogen receptor positive   Histology per Pathology Report:  11/09/2020 FINAL MICROSCOPIC DIAGNOSIS:  A. BREAST, RIGHT, LUMPECTOMY:  - Benign breast tissue, see comment.  - No malignancy identified.  B. BREAST, LEFT, MODIFIED MASTECTOMY:  - No residual carcinoma identified.  - Treatment effect present.  - Biopsy site.  - Fibrocystic change and sclerosing adenosis.  - See oncology table.  C. LYMPH NODES, LEFT, AXILLARY CONTENTS:  - Eighteen of eighteen lymph nodes negative for carcinoma (0/18).  - Treatment effect.  D. BREAST, LEFT ANTERIOR MARGIN, EXCISION:  - One of one lymph nodes negative for carcinoma (0/1).  - Biopsy site and treatment effect.  - No malignancy identified.  COMMENT:  A. There are no significant lesions and definitive calcifications are not seen. Additional sections were evaluated.   Receptor Status: ER(10%), PR (Negative), Her2-neu (Positive), Ki-67(60%)  Did patient present with symptoms (if so, please note symptoms) or was this found on screening mammography?:  Screening mammogram on 09/16/2019 showed right breast calcifications. Biopsy was attempted on 09/24/2019 but calcifications were too superficial and short term follow-up was recommended. Right breast diagnostic mammogram on 04/01/2020 showed probably benign, stable right breast calcifications. Patient reported an inflamed, enlarged left breast x1 week that began abruptly with significant pain and she was placed on Bactrim. Diagnostic mammogram of the left breast on 06/02/2020 showed a 3.5cm mass at the 11 o'clock position and at least 3 abnormal left axillary lymph nodes, the largest with cortical thickening 1.2cm  Past/Anticipated interventions by surgeon, if any:  11/09/2020 Dr. [name] --Left Modified Radical Mastectomy (left mastectomy with axillary lymph node dissection) --Right seed localized excisional  biopsy  Past/Anticipated interventions by medical oncology, if any:  Under care of Dr. Nicholas Lose 12/16/2020 --Recommendationbased on multidisciplinary tumor board: 1. Neoadjuvant chemotherapy with TCH Perjeta 6 cycles followed by Herceptin Perjeta maintenance for 1 year 2.11/09/20:Right lumpectomy and left mastectomy [name]): Right breast: no evidence of malignancy Left breast: no residual carcinoma, with 19 left axillary lymph nodes negative for carcinoma. 3. Followed by adjuvant radiation therapy(due to inflammatory breast changes at diagnosis) --Treatment plan: Herceptin Perjeta maintenance, referral to radiation 1. She plans to go to [place] for her daughter's graduation ceremony May 14. 2. Return to clinic every 3 weeks for Herceptin Perjeta and every 6 weeks with labs and follow-up with me  Lymphedema issues, if any: Patient denies. Patient is currently going to PT twice a week, and was already been measured for a compression sleeve should she start developing symptoms    Pain issues, if any:  Patient denies  SAFETY ISSUES:  Prior radiation? No  Pacemaker/ICD? No  Possible current pregnancy? No--hysterectomy 2018  Is the patient on methotrexate? No  Current Complaints / other details:  Patient has received the first two [place] vaccines

## 2020-12-21 ENCOUNTER — Ambulatory Visit
Admission: RE | Admit: 2020-12-21 | Discharge: 2020-12-21 | Disposition: A | Discharge status: Home / Self Care | Payer: 59 | Source: Ambulatory Visit | Attending: Radiation Oncology | Admitting: Radiation Oncology

## 2020-12-21 ENCOUNTER — Other Ambulatory Visit: Payer: Self-pay

## 2020-12-21 ENCOUNTER — Telehealth: Payer: Self-pay | Admitting: Hematology and Oncology

## 2020-12-21 VITALS — BP 107/77 | HR 77 | Temp 97.9°F | Resp 18 | Ht 68.0 in | Wt 185.0 lb

## 2020-12-21 DIAGNOSIS — Z170 Estrogen receptor positive status [ER+]: Secondary | ICD-10-CM

## 2020-12-21 DIAGNOSIS — L739 Follicular disorder, unspecified: Secondary | ICD-10-CM | POA: Insufficient documentation

## 2020-12-21 DIAGNOSIS — C50212 Malignant neoplasm of upper-inner quadrant of left female breast: Secondary | ICD-10-CM | POA: Insufficient documentation

## 2020-12-21 DIAGNOSIS — N6022 Fibroadenosis of left breast: Secondary | ICD-10-CM | POA: Insufficient documentation

## 2020-12-21 DIAGNOSIS — Z79899 Other long term (current) drug therapy: Secondary | ICD-10-CM | POA: Insufficient documentation

## 2020-12-21 NOTE — Telephone Encounter (Signed)
Sch per 5/3 los, Pt aware.

## 2020-12-22 ENCOUNTER — Ambulatory Visit: Payer: 59

## 2020-12-22 ENCOUNTER — Encounter: Payer: Self-pay | Admitting: Radiation Oncology

## 2020-12-22 DIAGNOSIS — M25612 Stiffness of left shoulder, not elsewhere classified: Secondary | ICD-10-CM

## 2020-12-22 DIAGNOSIS — R293 Abnormal posture: Secondary | ICD-10-CM

## 2020-12-22 DIAGNOSIS — Z170 Estrogen receptor positive status [ER+]: Secondary | ICD-10-CM

## 2020-12-22 DIAGNOSIS — C50212 Malignant neoplasm of upper-inner quadrant of left female breast: Secondary | ICD-10-CM

## 2020-12-22 NOTE — Therapy (Signed)
[place], [place], [address] Phone: [phone]   Fax:  [phone]  Physical Therapy Treatment  Patient Details  Name: Robin Kennedy MRN: [id number] Date of Birth: [birth year] Referring Provider (PT): Dr. [name]   Encounter Date: 12/22/2020   PT End of Session - 12/22/20 1005    Visit Number 7    Number of Visits 10    Date for PT Re-Evaluation 12/31/20    PT Start Time 0902    PT Stop Time 1003    PT Time Calculation (min) 61 min    Activity Tolerance Patient tolerated treatment well    Behavior During Therapy [facility] for tasks assessed/performed           Past Medical History:  Diagnosis Date  . Diabetes mellitus without complication ([place])   . Personal history of chemotherapy   . SVD (spontaneous vaginal delivery)    X 3    Past Surgical History:  Procedure Laterality Date  . BREAST BIOPSY Left 06/02/2020   x2  . BREAST BIOPSY Right 06/22/2020  . CYSTOSCOPY N/A 12/20/2016   Procedure: CYSTOSCOPY;  Surgeon: Lavonia Drafts, MD;  Location: [facility];  Service: Gynecology;  Laterality: N/A;  . MODIFIED MASTECTOMY Left 11/09/2020   Procedure: LEFT MODIFIED MASTECTOMY WITH LYMPH NODE DISSECTION;  Surgeon: [name], MD;  Location: [place];  Service: General;  Laterality: Left;  . PORTACATH PLACEMENT Left 06/23/2020   Procedure: INSERTION PORT-A-CATH WITH ULTRASOUND GUIDANCE;  Surgeon: [name], MD;  Location: [place];  Service: General;  Laterality: Left;  . RADIOACTIVE SEED GUIDED EXCISIONAL BREAST BIOPSY Right 11/09/2020   Procedure: RIGHT BREAST SEED LOCALIZED EXCISIONAL BIOPSY;  Surgeon: [name], MD;  Location: [place];  Service: General;  Laterality: Right;  . ROBOTIC ASSISTED TOTAL HYSTERECTOMY WITH BILATERAL SALPINGO OOPHERECTOMY Bilateral 12/20/2016   Procedure: ROBOTIC ASSISTED TOTAL HYSTERECTOMY WITH  BILATERAL SALPINGO OOPHORECTOMY;  Surgeon: Lavonia Drafts, MD;  Location: [facility];  Service: Gynecology;  Laterality: Bilateral;  . TUBAL LIGATION    . WISDOM TOOTH EXTRACTION      There were no vitals filed for this visit.   Subjective Assessment - 12/22/20 0904    Subjective I just saw my diabetic doctor. My sugar was a little high today but I've started noticing this about a week after my infusions so she is working with me to try to keep this more level. I had my radiation simulation and other than the machine messing up and making it take longer than expected my Lt shoulder did well with the positioning. I am going to have to get 30 instead of 20 treatments though with boost at the end.    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 It is ER positive, PR negative, and HER2 positive with a Ki67 of 60%. She underwent neoadjuvant chemotherapy 06/25/2020-10/07/2020 followed by a right lumpectomy and a left mastectomy with an axillary lymph node dissection (19 negative nodes removed) on 11/09/2020. She has type II diabetes.    Patient Stated Goals Get my arm moving again    Currently in Pain? No/denies   just tingling in my hands from the CIPN                            [facility] Adult PT Treatment/Exercise - 12/22/20 0001      Shoulder Exercises:  Standing   Other Standing Exercises Bil UE 3 way raises x10-12 each returning therapist demo and holding 1# with back against wall/core engaged, head and shoulders on wall      Shoulder Exercises: Pulleys   Flexion 2 minutes    Scaption 2 minutes    Scaption Limitations VCs to remind pt to sit erect and decrease Lt scapular compensation      Shoulder Exercises: Therapy Ball   Flexion Both;10 reps   forward lean into end of stretch   ABduction Left;10 reps      Manual Therapy   Myofascial Release to left axillary region where cording only mildly palpable  today    Passive ROM PROM left shoulder flex, abduction, D2 flexion, also abd with er to work on radiation postioning                       PT Long Term Goals - 12/03/20 1330      PT LONG TERM GOAL #1   Title Patient will demonstrate she has regained full shoulder ROM and function post op compared to baseline assessments.    Time 4    Period Weeks    Status On-going    Target Date 12/31/20      PT LONG TERM GOAL #2   Title Patient will increase left shoulder flexion AROM to >/= 140 degrees for increased ease reaching overhead.    Baseline 42 post op / 151 pre-op    Time 4    Period Weeks    Status New    Target Date 12/31/20      PT LONG TERM GOAL #3   Title Patient will increase left shoulder AROM abduction to >/= 150 degrees for increased ability to obtain radiation positioning.    Baseline 64 degrees post op / 167 pre-op    Time 4    Period Weeks    Status New    Target Date 12/31/20      PT LONG TERM GOAL #4   Title Patient will improve her DASH score to </= 15 for improved overall UE function.    Baseline 4.55 pre-op; 75 post op    Time 4    Period Weeks    Status New    Target Date 12/31/20      PT LONG TERM GOAL #5   Title Patient will verbalize understnading of lymphedema risk reduction practices.    Time 4    Period Weeks    Status New    Target Date 12/31/20                 Plan - 12/22/20 1326    Clinical Impression Statement Continued with AA/ROM with cuing for correct technique and to decrease compensations. Pt was able to easily attain radiation positioning this week and will start daily radiation treatments Monday. Continued with manual therapy as well working to further improve end P/ROM of Lt shoulder.    Stability/Clinical Decision Making Stable/Uncomplicated    Rehab Potential Excellent    PT Frequency 2x / week    PT Duration 4 weeks    PT Treatment/Interventions ADLs/Self Care Home Management;Therapeutic  exercise;Patient/family education;Manual techniques;Manual lymph drainage;Passive range of motion;Scar mobilization    PT Next Visit Plan Cont AAROM, PROM, STM to pectoralis/axillary region prn, MFR to cording; Review supine scapular series?    PT Home Exercise Plan Post op shoulder ROM HEP, supine wand flex and scaption, doorway stretches for end flexion and  abduction; supine sacpular series with yellow theraband    Consulted and Agree with Plan of Care Patient           Patient will benefit from skilled therapeutic intervention in order to improve the following deficits and impairments:  Postural dysfunction,Decreased knowledge of precautions,Impaired UE functional use,Pain,Decreased range of motion,Decreased scar mobility  Visit Diagnosis: Malignant neoplasm of upper-inner quadrant of left breast in female, estrogen receptor positive ([facility])  Abnormal posture  Stiffness of left shoulder, not elsewhere classified     Problem List Patient Active Problem List   Diagnosis Date Noted  . Breast cancer metastasized to axillary lymph node, left ([place]) 11/09/2020  . Port-A-Cath in place 07/02/2020  . Genetic testing 06/29/2020  . Malignant neoplasm of upper-inner quadrant of left breast in female, estrogen receptor positive ([place]) 06/05/2020  . Post-operative state 12/20/2016  . Fibroids 12/06/2016  . Pelvic pain in female 12/06/2016  . Diabetes mellitus ([place]) 06/19/2012    Otelia Limes, PTA 12/22/2020, 1:30 PM  [place], [place], [address] Phone: [phone]   Fax:  [phone]  Name: Eltha Tingley MRN: [id number] Date of Birth: [birth year]

## 2020-12-22 NOTE — Progress Notes (Signed)
Radiation Oncology         [phone]) [phone] ________________________________  Name: Robin Kennedy MRN: [id number]  Date: 12/21/2020  DOB: 1976/06/07  Follow-Up Visit Note  Outpatient  CC: Shawnee Knapp, MD  Nicholas Lose, MD  Diagnosis:      ICD-10-CM   1. Malignant neoplasm of upper-inner quadrant of left breast in female, estrogen receptor positive ([place])  C50.212    Z17.0    Cancer Staging Malignant neoplasm of upper-inner quadrant of left breast in female, estrogen receptor positive ([place]) Staging form: Breast, AJCC 8th Edition - Clinical stage from 06/10/2020: Stage IIIB (cT4d, cN3c, cM0, G3, ER+, PR-, HER2+) - Signed by Eppie Gibson, MD on 12/22/2020 Stage prefix: Initial diagnosis Histologic grading system: 3 grade system Laterality: Left Staged by: Pathologist and managing physician Stage used in treatment planning: Yes National guidelines used in treatment planning: Yes Type of national guideline used in treatment planning: NCCN   ypT0, ypN0   CHIEF COMPLAINT: Here to discuss management of left breast cancer  Narrative:  The patient returns today for follow-up.   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  In November, chest CT revealed extensive cutaneous thickening of the left breast and extensive metastatic adenopathy in the supraclavicular axillary and pectoralis regions ipsilaterally.  No distant metastatic disease.  Bone scan negative overall with the small focus of increased radiotracer in the right humerus.  CT of abdomen and pelvis  negative.  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  There was mildly enlarged anterior mediastinal node on MRI but this was not called suspicious  on CT chest.   Nonspecific lesion found on imaging in  the right breast was biopsied on 06/22/2020 showing sclerosing adenosis  Neoadjuvant chemotherapy with [facility] Perjeta was conducted   Right lumpectomy and left mastectomy [name]) conducted on 11/09/2020: Right breast: no evidence of malignancy. Left breast: no residual carcinoma, with 19 left axillary lymph nodes negative for carcinoma.   She anticipates Herceptin Perjeta maintenance for 1 year total   Histology per Pathology Report:  11/09/2020 FINAL MICROSCOPIC DIAGNOSIS:  A. BREAST, RIGHT, LUMPECTOMY:  - Benign breast tissue, see comment.  - No malignancy identified.  B. BREAST, LEFT, MODIFIED MASTECTOMY:  - No residual carcinoma identified.  - Treatment effect present.  - Biopsy site.  - Fibrocystic change and sclerosing adenosis.  - See oncology table.  C. LYMPH NODES, LEFT, AXILLARY CONTENTS:  - Eighteen of eighteen lymph nodes negative for carcinoma (0/18).  - Treatment effect.  D. BREAST, LEFT ANTERIOR MARGIN, EXCISION:  - One of one lymph nodes negative for carcinoma (0/1).  - Biopsy site and treatment effect.  - No malignancy identified.  COMMENT:  A. There are no significant lesions and definitive calcifications are not seen. Additional sections were evaluated.   Receptor Status: ER(10%), PR (Negative), Her2-neu (Positive), Ki-67(60%)    Lymphedema issues, if any: Patient denies. Patient is currently going to PT twice a week, and was already been measured for a compression sleeve should she start developing symptoms    Pain issues, if any:  Patient denies  SAFETY ISSUES:  Prior radiation? No  Pacemaker/ICD? No  Possible current pregnancy? No--hysterectomy 2018  Is the patient on methotrexate? No  Current Complaints / other details:  Patient has received the first two Pfizer vaccines: most recent  in mid November.        ALLERGIES:  has No Known Allergies.  Meds: Current Outpatient Medications  Medication Sig Dispense Refill  . glucose blood (ONE TOUCH ULTRA  TEST) test strip Use to check cbgs qd 100 each 11  . lidocaine-prilocaine (EMLA) cream Apply 1 application topically as needed. 30 g 1  . metFORMIN (GLUCOPHAGE-XR) 500 MG 24 hr tablet Take 1,000 mg by mouth 2 (two) times daily.  3  . methocarbamol (ROBAXIN) 500 MG tablet Take 1 tablet (500 mg total) by mouth every 6 (six) hours as needed for muscle spasms. 20 tablet 1  . oxyCODONE (OXY IR/ROXICODONE) 5 MG immediate release tablet Take 1 tablet (5 mg total) by mouth every 6 (six) hours as needed for severe pain. 15 tablet 0  . potassium chloride SA (KLOR-CON) 20 MEQ tablet Take 1 tablet (20 mEq total) by mouth daily. 30 tablet 0  . TRESIBA FLEXTOUCH 100 UNIT/ML FlexTouch Pen Inject 10 Units into the skin daily after breakfast.     No current facility-administered medications for this encounter.    Physical Findings:  height is '5\' 8"'  (1.727 m) and weight is 185 lb (83.9 kg). Her temperature is 97.9 F (36.6 C). Her blood pressure is 107/77 and her pulse is 77. Her respiration is 18 and oxygen saturation is 100%. .     General: Alert and oriented, in no acute distress Psychiatric: Judgment and insight are intact. Affect is appropriate. Breast exam reveals satisfactory healing of left mastectomy scars.  She has folliculitis present at the inframammary fold, nonerythematous.  See photograph below    Lab Findings: Lab Results  Component Value Date   WBC 5.3 12/16/2020   HGB 9.5 (L) 12/16/2020   HCT 30.3 (L) 12/16/2020   MCV 89.6 12/16/2020   PLT 228 12/16/2020    Radiographic Findings: As above  Impression/Plan: Locally advanced left breast cancer, complete pathologic response to chemotherapy.  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  I recommend 6 weeks of adjuvant radiation therapy to the left chest wall and regional nodes (including empiric coverage of internal mammary nodes)  in order to reduce risk of local  regional recurrence.  I reviewed the logistics, benefits, risks, and potential side effects of this treatment in detail. Risks may include but not necessary be limited to acute and late injury tissue in the radiation fields such as skin irritation (change in color/pigmentation, itching, dryness, pain, peeling). She may experience fatigue. We also discussed possible risk of long term cosmetic changes or scar tissue. There is also a smaller risk for lung toxicity, cardiac toxicity, brachial plexopathy, lymphedema, musculoskeletal changes, rib fragility or induction of a second malignancy, late chronic non-healing soft tissue wound.    The patient asked good questions which I answered to her satisfaction. She is enthusiastic about proceeding with treatment. A consent form has been signed and placed in her chart.  Proceed with CT simulation today.   On date of service, in total, I spent 30 minutes on this encounter. Patient was seen in person.  _____________________________________   Eppie Gibson, MD

## 2020-12-24 ENCOUNTER — Ambulatory Visit: Payer: 59

## 2020-12-24 ENCOUNTER — Other Ambulatory Visit: Payer: Self-pay

## 2020-12-24 DIAGNOSIS — C50212 Malignant neoplasm of upper-inner quadrant of left female breast: Secondary | ICD-10-CM

## 2020-12-24 DIAGNOSIS — M25612 Stiffness of left shoulder, not elsewhere classified: Secondary | ICD-10-CM

## 2020-12-24 DIAGNOSIS — R293 Abnormal posture: Secondary | ICD-10-CM

## 2020-12-24 NOTE — Patient Instructions (Signed)

## 2020-12-24 NOTE — Therapy (Signed)
[place], [place], [address] Phone: [phone]   Fax:  [phone]  Physical Therapy Treatment  Patient Details  Name: Robin Kennedy MRN: [id number] Date of Birth: 12-13-1975 Referring Provider (PT): Dr. [name]   Encounter Date: 12/24/2020   PT End of Session - 12/24/20 0922    Visit Number 8    Number of Visits 10    Date for PT Re-Evaluation 12/31/20    PT Start Time 0907    PT Stop Time 1002    PT Time Calculation (min) 55 min    Activity Tolerance Patient tolerated treatment well    Behavior During Therapy [facility] for tasks assessed/performed           Past Medical History:  Diagnosis Date  . Diabetes mellitus without complication ([place])   . Personal history of chemotherapy   . SVD (spontaneous vaginal delivery)    X 3    Past Surgical History:  Procedure Laterality Date  . BREAST BIOPSY Left 06/02/2020   x2  . BREAST BIOPSY Right 06/22/2020  . CYSTOSCOPY N/A 12/20/2016   Procedure: CYSTOSCOPY;  Surgeon: Lavonia Drafts, MD;  Location: [facility];  Service: Gynecology;  Laterality: N/A;  . MODIFIED MASTECTOMY Left 11/09/2020   Procedure: LEFT MODIFIED MASTECTOMY WITH LYMPH NODE DISSECTION;  Surgeon: [name], MD;  Location: [place];  Service: General;  Laterality: Left;  . PORTACATH PLACEMENT Left 06/23/2020   Procedure: INSERTION PORT-A-CATH WITH ULTRASOUND GUIDANCE;  Surgeon: [name], MD;  Location: [place];  Service: General;  Laterality: Left;  . RADIOACTIVE SEED GUIDED EXCISIONAL BREAST BIOPSY Right 11/09/2020   Procedure: RIGHT BREAST SEED LOCALIZED EXCISIONAL BIOPSY;  Surgeon: [name], MD;  Location: [place];  Service: General;  Laterality: Right;  . ROBOTIC ASSISTED TOTAL HYSTERECTOMY WITH BILATERAL SALPINGO OOPHERECTOMY Bilateral 12/20/2016   Procedure: ROBOTIC ASSISTED TOTAL HYSTERECTOMY WITH  BILATERAL SALPINGO OOPHORECTOMY;  Surgeon: Lavonia Drafts, MD;  Location: [facility];  Service: Gynecology;  Laterality: Bilateral;  . TUBAL LIGATION    . WISDOM TOOTH EXTRACTION      There were no vitals filed for this visit.   Subjective Assessment - 12/24/20 0912    Subjective I can tell I'm moving my Lt shoulder more and hiking my shoulder a little less. I've been working on it alot at home.    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 It is ER positive, PR negative, and HER2 positive with a Ki67 of 60%. She underwent neoadjuvant chemotherapy 06/25/2020-10/07/2020 followed by a right lumpectomy and a left mastectomy with an axillary lymph node dissection (19 negative nodes removed) on 11/09/2020. She has type II diabetes.    Patient Stated Goals Get my arm moving again    Currently in Pain? No/denies                             [facility] Adult PT Treatment/Exercise - 12/24/20 0001      Shoulder Exercises: Standing   Other Standing Exercises Bil UE 3 way raises x10-12 each returning therapist demo and holding 1# with back against wall/core engaged, head and shoulders on wall      Shoulder Exercises: Pulleys   Flexion 2 minutes   1# on each wrist   Flexion Limitations Brief VCs to remind pt to decrease Lt scapular compensation then pt able to self  correct, improvement with this noted    ABduction 2 minutes   1# on each wrist   ABduction Limitations VCs required to remind pt during to decrease Lt scapular compensation      Shoulder Exercises: Therapy Ball   Flexion Both;10 reps   1# each wrist, forward lean into end of stretch   ABduction Left;10 reps   1# on wrist, same side lean into end of stretch     Manual Therapy   Myofascial Release to left axillary region where cording only mildly palpable today    Passive ROM PROM left shoulder flex, abduction, D2 flexion, also abd with er to work on  Tree surgeon                  PT Education - 12/24/20 0918    Education Details Bil UE 3 way raises    Person(s) Educated Patient    Methods Explanation;Demonstration;Handout    Comprehension Verbalized understanding;Returned demonstration               PT Long Term Goals - 12/03/20 1330      PT LONG TERM GOAL #1   Title Patient will demonstrate she has regained full shoulder ROM and function post op compared to baseline assessments.    Time 4    Period Weeks    Status On-going    Target Date 12/31/20      PT LONG TERM GOAL #2   Title Patient will increase left shoulder flexion AROM to >/= 140 degrees for increased ease reaching overhead.    Baseline 42 post op / 151 pre-op    Time 4    Period Weeks    Status New    Target Date 12/31/20      PT LONG TERM GOAL #3   Title Patient will increase left shoulder AROM abduction to >/= 150 degrees for increased ability to obtain radiation positioning.    Baseline 64 degrees post op / 167 pre-op    Time 4    Period Weeks    Status New    Target Date 12/31/20      PT LONG TERM GOAL #4   Title Patient will improve her DASH score to </= 15 for improved overall UE function.    Baseline 4.55 pre-op; 75 post op    Time 4    Period Weeks    Status New    Target Date 12/31/20      PT LONG TERM GOAL #5   Title Patient will verbalize understnading of lymphedema risk reduction practices.    Time 4    Period Weeks    Status New    Target Date 12/31/20                 Plan - 12/24/20 0923    Clinical Impression Statement Continued progressing AA/ROM with adding 1 lb weights to wrist for all motions today. Then continued with manual therapy working to improve end Lt shoulder motions and decreaseing fascial restrictions at Lt axilla.    Stability/Clinical Decision Making Stable/Uncomplicated    Rehab Potential Excellent    PT Frequency 2x / week    PT Duration 4 weeks    PT Treatment/Interventions  ADLs/Self Care Home Management;Therapeutic exercise;Patient/family education;Manual techniques;Manual lymph drainage;Passive range of motion;Scar mobilization    PT Next Visit Plan Cont AAROM, PROM, STM to pectoralis/axillary region prn, MFR to cording    PT Home Exercise Plan Post op shoulder ROM HEP, supine wand flex  and scaption, doorway stretches for end flexion and abduction; supine sacpular series with yellow theraband    Consulted and Agree with Plan of Care Patient           Patient will benefit from skilled therapeutic intervention in order to improve the following deficits and impairments:  Postural dysfunction,Decreased knowledge of precautions,Impaired UE functional use,Pain,Decreased range of motion,Decreased scar mobility  Visit Diagnosis: Malignant neoplasm of upper-inner quadrant of left breast in female, estrogen receptor positive ([facility])  Abnormal posture  Stiffness of left shoulder, not elsewhere classified     Problem List Patient Active Problem List   Diagnosis Date Noted  . Breast cancer metastasized to axillary lymph node, left ([place]) 11/09/2020  . Port-A-Cath in place 07/02/2020  . Genetic testing 06/29/2020  . Malignant neoplasm of upper-inner quadrant of left breast in female, estrogen receptor positive ([place]) 06/05/2020  . Post-operative state 12/20/2016  . Fibroids 12/06/2016  . Pelvic pain in female 12/06/2016  . Diabetes mellitus ([place]) 06/19/2012    Otelia Limes, PTA 12/24/2020, 10:03 AM  [place], [place], [address] Phone: [phone]   Fax:  [phone]  Name: Robin Kennedy MRN: [id number] Date of Birth: 25-Jun-1976

## 2020-12-25 ENCOUNTER — Ambulatory Visit (HOSPITAL_COMMUNITY)
Admission: RE | Admit: 2020-12-25 | Discharge: 2020-12-25 | Disposition: A | Discharge status: Home / Self Care | Payer: 59 | Source: Ambulatory Visit | Attending: Hematology and Oncology | Admitting: Hematology and Oncology

## 2020-12-25 DIAGNOSIS — E119 Type 2 diabetes mellitus without complications: Secondary | ICD-10-CM | POA: Insufficient documentation

## 2020-12-25 DIAGNOSIS — Z170 Estrogen receptor positive status [ER+]: Secondary | ICD-10-CM | POA: Insufficient documentation

## 2020-12-25 DIAGNOSIS — C50212 Malignant neoplasm of upper-inner quadrant of left female breast: Secondary | ICD-10-CM | POA: Diagnosis not present

## 2020-12-25 DIAGNOSIS — Z0189 Encounter for other specified special examinations: Secondary | ICD-10-CM

## 2020-12-25 LAB — ECHOCARDIOGRAM COMPLETE
Area-P 1/2: 2.85 cm2
S' Lateral: 3.1 cm

## 2020-12-25 NOTE — Progress Notes (Signed)
  Echocardiogram 2D Echocardiogram has been performed.  Robin Kennedy 12/25/2020, 10:39 AM

## 2020-12-28 ENCOUNTER — Ambulatory Visit: Admission: RE | Admit: 2020-12-28 | Payer: 59 | Source: Ambulatory Visit

## 2020-12-28 ENCOUNTER — Ambulatory Visit: Payer: 59

## 2020-12-28 ENCOUNTER — Encounter: Payer: Self-pay | Admitting: *Deleted

## 2020-12-28 DIAGNOSIS — C50212 Malignant neoplasm of upper-inner quadrant of left female breast: Secondary | ICD-10-CM | POA: Diagnosis not present

## 2020-12-29 ENCOUNTER — Ambulatory Visit: Payer: 59

## 2020-12-29 ENCOUNTER — Other Ambulatory Visit: Payer: Self-pay

## 2020-12-29 ENCOUNTER — Ambulatory Visit
Admission: RE | Admit: 2020-12-29 | Discharge: 2020-12-29 | Disposition: A | Discharge status: Home / Self Care | Payer: 59 | Source: Ambulatory Visit | Attending: Radiation Oncology | Admitting: Radiation Oncology

## 2020-12-29 DIAGNOSIS — C50212 Malignant neoplasm of upper-inner quadrant of left female breast: Secondary | ICD-10-CM | POA: Diagnosis not present

## 2020-12-30 ENCOUNTER — Ambulatory Visit
Admission: RE | Admit: 2020-12-30 | Discharge: 2020-12-30 | Disposition: A | Discharge status: Home / Self Care | Payer: 59 | Source: Ambulatory Visit | Attending: Radiation Oncology | Admitting: Radiation Oncology

## 2020-12-30 ENCOUNTER — Inpatient Hospital Stay (HOSPITAL_BASED_OUTPATIENT_CLINIC_OR_DEPARTMENT_OTHER): Payer: 59

## 2020-12-30 ENCOUNTER — Ambulatory Visit: Payer: 59

## 2020-12-30 DIAGNOSIS — Z23 Encounter for immunization: Secondary | ICD-10-CM | POA: Diagnosis not present

## 2020-12-30 DIAGNOSIS — C50212 Malignant neoplasm of upper-inner quadrant of left female breast: Secondary | ICD-10-CM

## 2020-12-30 DIAGNOSIS — Z170 Estrogen receptor positive status [ER+]: Secondary | ICD-10-CM

## 2020-12-30 MED ORDER — ALRA NON-METALLIC DEODORANT (RAD-ONC)
1.0000 "application " | Freq: Once | TOPICAL | Status: AC
  Administered 2020-12-30: 1 via TOPICAL

## 2020-12-30 MED ORDER — RADIAPLEXRX EX GEL: Freq: Once | CUTANEOUS | Status: AC

## 2020-12-30 NOTE — Progress Notes (Signed)
   Covid-19 Vaccination Clinic  Name:  Robin Kennedy    MRN: [id number] DOB: [birth year]  12/30/2020  Robin Kennedy was observed post Covid-19 immunization for 15 minutes without incident. She was provided with Vaccine Information Sheet and instruction to access the V-Safe system.   Robin Kennedy was instructed to call 911 with any severe reactions post vaccine: Marland Kitchen Difficulty breathing  . Swelling of face and throat  . A fast heartbeat  . A bad rash all over body  . Dizziness and weakness

## 2020-12-30 NOTE — Progress Notes (Signed)

## 2020-12-31 ENCOUNTER — Other Ambulatory Visit: Payer: Self-pay

## 2020-12-31 ENCOUNTER — Ambulatory Visit: Payer: 59

## 2020-12-31 ENCOUNTER — Ambulatory Visit
Admission: RE | Admit: 2020-12-31 | Discharge: 2020-12-31 | Disposition: A | Discharge status: Home / Self Care | Payer: 59 | Source: Ambulatory Visit | Attending: Radiation Oncology | Admitting: Radiation Oncology

## 2020-12-31 DIAGNOSIS — Z170 Estrogen receptor positive status [ER+]: Secondary | ICD-10-CM

## 2020-12-31 DIAGNOSIS — R293 Abnormal posture: Secondary | ICD-10-CM

## 2020-12-31 DIAGNOSIS — M25612 Stiffness of left shoulder, not elsewhere classified: Secondary | ICD-10-CM

## 2020-12-31 DIAGNOSIS — C50212 Malignant neoplasm of upper-inner quadrant of left female breast: Secondary | ICD-10-CM

## 2020-12-31 NOTE — Therapy (Signed)
[place], [place], [address] Phone: [phone]   Fax:  [phone]  Physical Therapy Treatment  Patient Details  Name: Robin Kennedy MRN: [id number] Date of Birth: 28-Aug-1975 Referring Provider (PT): Dr. [name]   Encounter Date: 12/31/2020   PT End of Session - 12/31/20 0902    Visit Number 9    Number of Visits 10    Date for PT Re-Evaluation 12/31/20    PT Start Time 0803    PT Stop Time 0902    PT Time Calculation (min) 59 min    Activity Tolerance Patient tolerated treatment well    Behavior During Therapy [facility] for tasks assessed/performed           Past Medical History:  Diagnosis Date  . Diabetes mellitus without complication ([place])   . Personal history of chemotherapy   . SVD (spontaneous vaginal delivery)    X 3    Past Surgical History:  Procedure Laterality Date  . BREAST BIOPSY Left 06/02/2020   x2  . BREAST BIOPSY Right 06/22/2020  . CYSTOSCOPY N/A 12/20/2016   Procedure: CYSTOSCOPY;  Surgeon: Lavonia Drafts, MD;  Location: [facility];  Service: Gynecology;  Laterality: N/A;  . MODIFIED MASTECTOMY Left 11/09/2020   Procedure: LEFT MODIFIED MASTECTOMY WITH LYMPH NODE DISSECTION;  Surgeon: [name], MD;  Location: [place];  Service: General;  Laterality: Left;  . PORTACATH PLACEMENT Left 06/23/2020   Procedure: INSERTION PORT-A-CATH WITH ULTRASOUND GUIDANCE;  Surgeon: [name], MD;  Location: [place];  Service: General;  Laterality: Left;  . RADIOACTIVE SEED GUIDED EXCISIONAL BREAST BIOPSY Right 11/09/2020   Procedure: RIGHT BREAST SEED LOCALIZED EXCISIONAL BIOPSY;  Surgeon: [name], MD;  Location: [place];  Service: General;  Laterality: Right;  . ROBOTIC ASSISTED TOTAL HYSTERECTOMY WITH BILATERAL SALPINGO OOPHERECTOMY Bilateral 12/20/2016   Procedure: ROBOTIC ASSISTED TOTAL HYSTERECTOMY WITH  BILATERAL SALPINGO OOPHORECTOMY;  Surgeon: Lavonia Drafts, MD;  Location: [facility];  Service: Gynecology;  Laterality: Bilateral;  . TUBAL LIGATION    . WISDOM TOOTH EXTRACTION      There were no vitals filed for this visit.   Subjective Assessment - 12/31/20 0805    Subjective I got my COVID booster yesterday so my Rt arm is hurting today. Radiation is going okay. So far I'm getting my arm in the position okay but the radiation tech wanted to make sure I was still getting therapy because she could tell I was a little tight. But for me, getting my arm up there is going fine.    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 It is ER positive, PR negative, and HER2 positive with a Ki67 of 60%. She underwent neoadjuvant chemotherapy 06/25/2020-10/07/2020 followed by a right lumpectomy and a left mastectomy with an axillary lymph node dissection (19 negative nodes removed) on 11/09/2020. She has type II diabetes.    Patient Stated Goals Get my arm moving again    Currently in Pain? No/denies   Rt UE 7/10 from booster shot yesterday, unrelated to current diagnosis                            [facility] Adult PT Treatment/Exercise - 12/31/20 0001      Shoulder Exercises: Pulleys   Flexion 2 minutes    Flexion Limitations Pt able to return good technique  ABduction 2 minutes      Shoulder Exercises: Therapy Ball   Flexion Both;10 reps   forward lean into end of stretch   ABduction Left;10 reps   same side lean into end of stretch     Shoulder Exercises: Stretch   Wall Stretch - Flexion 5 reps   5 sec holds, modified downward dog on wall   Wall Stretch - Flexion Limitations pt returned therapist demo      Manual Therapy   Myofascial Release to left axillary region where cording only mildly palpable today    Passive ROM PROM left shoulder flex, abduction, D2 flexion, also abd with er to work on radiation  postioning                       PT Long Term Goals - 12/03/20 1330      PT LONG TERM GOAL #1   Title Patient will demonstrate she has regained full shoulder ROM and function post op compared to baseline assessments.    Time 4    Period Weeks    Status On-going    Target Date 12/31/20      PT LONG TERM GOAL #2   Title Patient will increase left shoulder flexion AROM to >/= 140 degrees for increased ease reaching overhead.    Baseline 42 post op / 151 pre-op    Time 4    Period Weeks    Status New    Target Date 12/31/20      PT LONG TERM GOAL #3   Title Patient will increase left shoulder AROM abduction to >/= 150 degrees for increased ability to obtain radiation positioning.    Baseline 64 degrees post op / 167 pre-op    Time 4    Period Weeks    Status New    Target Date 12/31/20      PT LONG TERM GOAL #4   Title Patient will improve her DASH score to </= 15 for improved overall UE function.    Baseline 4.55 pre-op; 75 post op    Time 4    Period Weeks    Status New    Target Date 12/31/20      PT LONG TERM GOAL #5   Title Patient will verbalize understnading of lymphedema risk reduction practices.    Time 4    Period Weeks    Status New    Target Date 12/31/20                 Plan - 12/31/20 5701    Clinical Impression Statement Continued with AA/ROM and pt is progressing well with this. Also continued with focus on manual therapy working to decrease Lt axillary fascial restrictions to improve Lt shoulder P/ROM to help pt be able to manage radiation position as she has started this, this week.    Rehab Potential Excellent    PT Frequency 2x / week    PT Duration 4 weeks    PT Treatment/Interventions ADLs/Self Care Home Management;Therapeutic exercise;Patient/family education;Manual techniques;Manual lymph drainage;Passive range of motion;Scar mobilization    PT Next Visit Plan Renewal next, consider reducing freq 1x/wk; Cont AAROM, PROM, STM  to pectoralis/axillary region prn, MFR to cording    PT Home Exercise Plan Post op shoulder ROM HEP, supine wand flex and scaption, doorway stretches for end flexion and abduction; supine sacpular series with yellow theraband    Consulted and Agree with Plan of Care Patient  Patient will benefit from skilled therapeutic intervention in order to improve the following deficits and impairments:  Postural dysfunction,Decreased knowledge of precautions,Impaired UE functional use,Pain,Decreased range of motion,Decreased scar mobility  Visit Diagnosis: Malignant neoplasm of upper-inner quadrant of left breast in female, estrogen receptor positive ([place])  Abnormal posture  Stiffness of left shoulder, not elsewhere classified     Problem List Patient Active Problem List   Diagnosis Date Noted  . Breast cancer metastasized to axillary lymph node, left ([place]) 11/09/2020  . Port-A-Cath in place 07/02/2020  . Genetic testing 06/29/2020  . Malignant neoplasm of upper-inner quadrant of left breast in female, estrogen receptor positive ([place]) 06/05/2020  . Post-operative state 12/20/2016  . Fibroids 12/06/2016  . Pelvic pain in female 12/06/2016  . Diabetes mellitus ([place]) 06/19/2012    Otelia Limes, PTA 12/31/2020, 9:08 AM  [facility] [place], [place], [address] Phone: [phone]   Fax:  [phone]  Name: Robin Kennedy MRN: [id number] Date of Birth: February 05, 1976

## 2021-01-01 ENCOUNTER — Ambulatory Visit
Admission: RE | Admit: 2021-01-01 | Discharge: 2021-01-01 | Disposition: A | Discharge status: Home / Self Care | Payer: 59 | Source: Ambulatory Visit | Attending: Radiation Oncology | Admitting: Radiation Oncology

## 2021-01-01 ENCOUNTER — Ambulatory Visit: Payer: 59

## 2021-01-01 DIAGNOSIS — C50212 Malignant neoplasm of upper-inner quadrant of left female breast: Secondary | ICD-10-CM | POA: Diagnosis not present

## 2021-01-04 ENCOUNTER — Other Ambulatory Visit: Payer: Self-pay

## 2021-01-04 ENCOUNTER — Ambulatory Visit: Payer: 59

## 2021-01-04 ENCOUNTER — Ambulatory Visit
Admission: RE | Admit: 2021-01-04 | Discharge: 2021-01-04 | Disposition: A | Discharge status: Home / Self Care | Payer: 59 | Source: Ambulatory Visit | Attending: Radiation Oncology | Admitting: Radiation Oncology

## 2021-01-04 DIAGNOSIS — C50212 Malignant neoplasm of upper-inner quadrant of left female breast: Secondary | ICD-10-CM | POA: Diagnosis not present

## 2021-01-05 ENCOUNTER — Ambulatory Visit: Payer: 59

## 2021-01-05 ENCOUNTER — Ambulatory Visit
Admission: RE | Admit: 2021-01-05 | Discharge: 2021-01-05 | Disposition: A | Discharge status: Home / Self Care | Payer: 59 | Source: Ambulatory Visit | Attending: Radiation Oncology | Admitting: Radiation Oncology

## 2021-01-05 DIAGNOSIS — Z170 Estrogen receptor positive status [ER+]: Secondary | ICD-10-CM

## 2021-01-05 DIAGNOSIS — C50212 Malignant neoplasm of upper-inner quadrant of left female breast: Secondary | ICD-10-CM | POA: Diagnosis not present

## 2021-01-05 DIAGNOSIS — R293 Abnormal posture: Secondary | ICD-10-CM

## 2021-01-05 DIAGNOSIS — M25612 Stiffness of left shoulder, not elsewhere classified: Secondary | ICD-10-CM

## 2021-01-05 NOTE — Therapy (Signed)
[place], [place], [address] Phone: [phone]   Fax:  [phone]  Physical Therapy Treatment  Patient Details  Name: Robin Kennedy MRN: [id number] Date of Birth: [birth year] Referring Provider (PT): Dr. [name]   Encounter Date: 01/05/2021   PT End of Session - 01/05/21 0859    Visit Number 10    Number of Visits 22    Date for PT Re-Evaluation 02/16/21    PT Start Time 0803    PT Stop Time 0902    PT Time Calculation (min) 59 min    Activity Tolerance Patient tolerated treatment well    Behavior During Therapy [facility] for tasks assessed/performed           Past Medical History:  Diagnosis Date  . Diabetes mellitus without complication ([place])   . Personal history of chemotherapy   . SVD (spontaneous vaginal delivery)    X 3    Past Surgical History:  Procedure Laterality Date  . BREAST BIOPSY Left 06/02/2020   x2  . BREAST BIOPSY Right 06/22/2020  . CYSTOSCOPY N/A 12/20/2016   Procedure: CYSTOSCOPY;  Surgeon: Lavonia Drafts, MD;  Location: [facility];  Service: Gynecology;  Laterality: N/A;  . MODIFIED MASTECTOMY Left 11/09/2020   Procedure: LEFT MODIFIED MASTECTOMY WITH LYMPH NODE DISSECTION;  Surgeon: [name], MD;  Location: [place];  Service: General;  Laterality: Left;  . PORTACATH PLACEMENT Left 06/23/2020   Procedure: INSERTION PORT-A-CATH WITH ULTRASOUND GUIDANCE;  Surgeon: [name], MD;  Location: [place];  Service: General;  Laterality: Left;  . RADIOACTIVE SEED GUIDED EXCISIONAL BREAST BIOPSY Right 11/09/2020   Procedure: RIGHT BREAST SEED LOCALIZED EXCISIONAL BIOPSY;  Surgeon: [name], MD;  Location: [place];  Service: General;  Laterality: Right;  . ROBOTIC ASSISTED TOTAL HYSTERECTOMY WITH BILATERAL SALPINGO OOPHERECTOMY Bilateral 12/20/2016   Procedure: ROBOTIC ASSISTED TOTAL HYSTERECTOMY WITH  BILATERAL SALPINGO OOPHORECTOMY;  Surgeon: Lavonia Drafts, MD;  Location: [facility];  Service: Gynecology;  Laterality: Bilateral;  . TUBAL LIGATION    . WISDOM TOOTH EXTRACTION      There were no vitals filed for this visit.   Subjective Assessment - 01/05/21 0814    Subjective I would like to finish 2x/wk this week then reduce to 1x/wk while I'm going through radiation.    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 It is ER positive, PR negative, and HER2 positive with a Ki67 of 60%. She underwent neoadjuvant chemotherapy 06/25/2020-10/07/2020 followed by a right lumpectomy and a left mastectomy with an axillary lymph node dissection (19 negative nodes removed) on 11/09/2020. She has type II diabetes.    Patient Stated Goals Get my arm moving again    Currently in Pain? No/denies              [facility] PT Assessment - 01/05/21 0001      AROM   Left Shoulder Extension 64 Degrees    Left Shoulder Flexion 154 Degrees    Left Shoulder ABduction 150 Degrees                 Quick Dash - 01/05/21 0001    Open a tight or new jar No difficulty    Do heavy household chores (wash walls, wash floors) No difficulty    Carry a shopping bag or briefcase No difficulty    Wash your back No difficulty    Use  a knife to cut food Mild difficulty    Recreational activities in which you take some force or impact through your arm, shoulder, or hand (golf, hammering, tennis) Mild difficulty    During the past week, to what extent has your arm, shoulder or hand problem interfered with your normal social activities with family, friends, neighbors, or groups? Slightly    During the past week, to what extent has your arm, shoulder or hand problem limited your work or other regular daily activities Slightly    Arm, shoulder, or hand pain. None    Tingling (pins and needles) in your arm, shoulder, or hand Mild    Difficulty  Sleeping No difficulty    DASH Score 11.36 %                  [facility] Adult PT Treatment/Exercise - 01/05/21 0001      Shoulder Exercises: Pulleys   Flexion 2 minutes    Flexion Limitations VCs to decrease trunk lean    ABduction 2 minutes    ABduction Limitations VCs required to remind pt during to decrease Lt scapular compensation      Shoulder Exercises: Therapy Ball   Flexion Both;10 reps   forward lean into end of stretch     Manual Therapy   Myofascial Release to left axillary region where cording only mildly palpable today    Passive ROM PROM left shoulder flex, abduction, D2 flexion, also abd with er to work on radiation postioning                       PT Long Term Goals - 01/05/21 [place] #1   Title Patient will demonstrate she has regained full shoulder ROM and function post op compared to baseline assessments.    Status On-going      PT LONG TERM GOAL #2   Title Patient will increase left shoulder flexion AROM to >/= 160 degrees for increased ease reaching overhead.    Baseline 42 post op / 151 pre-op; 154 degrees - 01/05/21    Time 6    Period Weeks    Status Revised      PT LONG TERM GOAL #3   Title Patient will increase left shoulder AROM abduction to >/= 160 degrees for increased ability to obtain radiation positioning.    Baseline 64 degrees post op / 167 pre-op; 150 degrees - 01/05/21    Time 6    Period Weeks    Status Revised      PT LONG TERM GOAL #4   Title Patient will improve her DASH score to </= 10 for improved overall UE function.    Baseline 4.55 pre-op; 75 post op; 11.83 - 01/05/21    Time 6    Period Weeks    Status Revised      PT LONG TERM GOAL #5   Title Patient will verbalize understnading of lymphedema risk reduction practices.    Baseline Pt has met this goal as she attended the ABC class and questions were answered for this at the following session later that same afternoon - 01/05/21                  Plan - 01/05/21 0902    Clinical Impression Statement Renewal done this session. Pt has made excellent progress towards her A/ROM goals meeting those goals so progressed those today. She has begun radiation and would  like to cont physical therapy weekly as it is likely her tightness could return during this time, but for now plans on coming 1x/wk (though leaving POC for 2x/wk with option to increase if her ROM and/or tightness gets worse during radiation). Pt will also benefit from progressing HEP for postural strength.    Stability/Clinical Decision Making Stable/Uncomplicated    Rehab Potential Excellent    PT Frequency 2x / week    PT Duration 6 weeks    PT Treatment/Interventions ADLs/Self Care Home Management;Therapeutic exercise;Patient/family education;Manual techniques;Manual lymph drainage;Passive range of motion;Scar mobilization    PT Next Visit Plan Renewal done this visit for 2x/wk (though initally pt will try 1x/wk during radiation) for up to 6 wks in case we need to increase freq after radiation; cont manual therapy to decrease Lt axillary and chest wall tightness along with mild cording into upper arm; progress HEP for postural strength but cautioning pt about not overdoing strengthening during radiation    PT Home Exercise Plan Post op shoulder ROM HEP, supine wand flex and scaption, doorway stretches for end flexion and abduction; supine sacpular series with yellow theraband    Consulted and Agree with Plan of Care Patient           Patient will benefit from skilled therapeutic intervention in order to improve the following deficits and impairments:  Postural dysfunction,Decreased knowledge of precautions,Impaired UE functional use,Pain,Decreased range of motion,Decreased scar mobility  Visit Diagnosis: Malignant neoplasm of upper-inner quadrant of left breast in female, estrogen receptor positive ([facility])  Abnormal posture  Stiffness of left shoulder, not  elsewhere classified     Problem List Patient Active Problem List   Diagnosis Date Noted  . Breast cancer metastasized to axillary lymph node, left ([place]) 11/09/2020  . Port-A-Cath in place 07/02/2020  . Genetic testing 06/29/2020  . Malignant neoplasm of upper-inner quadrant of left breast in female, estrogen receptor positive ([place]) 06/05/2020  . Post-operative state 12/20/2016  . Fibroids 12/06/2016  . Pelvic pain in female 12/06/2016  . Diabetes mellitus ([place]) 06/19/2012    Otelia Limes, PTA 01/05/2021, 11:47 AM  [place], [place], [address] Phone: [phone]   Fax:  [phone]  Name: Robin Kennedy MRN: [id number] Date of Birth: 1976-04-12

## 2021-01-06 ENCOUNTER — Other Ambulatory Visit: Payer: Self-pay

## 2021-01-06 ENCOUNTER — Ambulatory Visit: Payer: 59

## 2021-01-06 ENCOUNTER — Ambulatory Visit
Admission: RE | Admit: 2021-01-06 | Discharge: 2021-01-06 | Disposition: A | Discharge status: Home / Self Care | Payer: 59 | Source: Ambulatory Visit | Attending: Radiation Oncology | Admitting: Radiation Oncology

## 2021-01-06 ENCOUNTER — Inpatient Hospital Stay: Payer: 59

## 2021-01-06 VITALS — BP 115/75 | HR 84 | Temp 97.8°F | Resp 12

## 2021-01-06 DIAGNOSIS — C50212 Malignant neoplasm of upper-inner quadrant of left female breast: Secondary | ICD-10-CM

## 2021-01-06 DIAGNOSIS — Z170 Estrogen receptor positive status [ER+]: Secondary | ICD-10-CM

## 2021-01-06 MED ORDER — TRASTUZUMAB-DKST CHEMO 150 MG IV SOLR
6.0000 mg/kg | Freq: Once | INTRAVENOUS | Status: AC
  Administered 2021-01-06: 525 mg via INTRAVENOUS
  Filled 2021-01-06: qty 25

## 2021-01-06 MED ORDER — SODIUM CHLORIDE 0.9% FLUSH
10.0000 mL | INTRAVENOUS | Status: DC | PRN
  Administered 2021-01-06: 10 mL
  Filled 2021-01-06: qty 10

## 2021-01-06 MED ORDER — DIPHENHYDRAMINE HCL 25 MG PO CAPS
50.0000 mg | ORAL_CAPSULE | Freq: Once | ORAL | Status: AC
  Administered 2021-01-06: 50 mg via ORAL

## 2021-01-06 MED ORDER — ACETAMINOPHEN 325 MG PO TABS
650.0000 mg | ORAL_TABLET | Freq: Once | ORAL | Status: AC
  Administered 2021-01-06: 650 mg via ORAL

## 2021-01-06 MED ORDER — DIPHENHYDRAMINE HCL 25 MG PO CAPS
ORAL_CAPSULE | ORAL | Status: AC
  Filled 2021-01-06: qty 2

## 2021-01-06 MED ORDER — SODIUM CHLORIDE 0.9 % IV SOLN
Freq: Once | INTRAVENOUS | Status: AC
  Filled 2021-01-06: qty 250

## 2021-01-06 MED ORDER — ACETAMINOPHEN 325 MG PO TABS
ORAL_TABLET | ORAL | Status: AC
  Filled 2021-01-06: qty 2

## 2021-01-06 MED ORDER — HEPARIN SOD (PORK) LOCK FLUSH 100 UNIT/ML IV SOLN
500.0000 [IU] | Freq: Once | INTRAVENOUS | Status: AC | PRN
  Administered 2021-01-06: 500 [IU]
  Filled 2021-01-06: qty 5

## 2021-01-06 MED ORDER — SODIUM CHLORIDE 0.9 % IV SOLN
420.0000 mg | Freq: Once | INTRAVENOUS | Status: AC
  Administered 2021-01-06: 420 mg via INTRAVENOUS
  Filled 2021-01-06: qty 14

## 2021-01-06 NOTE — Patient Instructions (Signed)
[facility]  Discharge Instructions: Thank you for choosing [facility] to provide your oncology and hematology care.   If you have a lab appointment with the [facility], please go directly to the [facility] and check in at the registration area.   Wear comfortable clothing and clothing appropriate for easy access to any Portacath or PICC line.   We strive to give you quality time with your provider. You may need to reschedule your appointment if you arrive late (15 or more minutes).  Arriving late affects you and other patients whose appointments are after yours.  Also, if you miss three or more appointments without notifying the office, you may be dismissed from the clinic at the provider's discretion.      For prescription refill requests, have your pharmacy contact our office and allow 72 hours for refills to be completed.    Today you received the following chemotherapy and/or immunotherapy agents:  Trastuzumab & Pertuzumab.     To help prevent nausea and vomiting after your treatment, we encourage you to take your nausea medication as directed.  BELOW ARE SYMPTOMS THAT SHOULD BE REPORTED IMMEDIATELY: . *FEVER GREATER THAN 100.4 F (38 C) OR HIGHER . *CHILLS OR SWEATING . *NAUSEA AND VOMITING THAT IS NOT CONTROLLED WITH YOUR NAUSEA MEDICATION . *UNUSUAL SHORTNESS OF BREATH . *UNUSUAL BRUISING OR BLEEDING . *URINARY PROBLEMS (pain or burning when urinating, or frequent urination) . *BOWEL PROBLEMS (unusual diarrhea, constipation, pain near the anus) . TENDERNESS IN MOUTH AND THROAT WITH OR WITHOUT PRESENCE OF ULCERS (sore throat, sores in mouth, or a toothache) . UNUSUAL RASH, SWELLING OR PAIN  . UNUSUAL VAGINAL DISCHARGE OR ITCHING   Items with * indicate a potential emergency and should be followed up as soon as possible or go to the Emergency Department if any problems should occur.  Please show the CHEMOTHERAPY ALERT CARD or  IMMUNOTHERAPY ALERT CARD at check-in to the Emergency Department and triage nurse.  Should you have questions after your visit or need to cancel or reschedule your appointment, please contact [facility]  Dept: [phone]  and follow the prompts.  Office hours are 8:00 a.m. to 4:30 p.m. Monday - Friday. Please note that voicemails left after 4:00 p.m. may not be returned until the following business day.  We are closed weekends and major holidays. You have access to a nurse at all times for urgent questions. Please call the main number to the clinic Dept: [phone] and follow the prompts.   For any non-urgent questions, you may also contact your provider using MyChart. We now offer e-Visits for anyone 22 and older to request care online for non-urgent symptoms. For details visit [email or web address].   Also download the MyChart app! Go to the app store, search "MyChart", open the app, select [facility], and log in with your MyChart username and password.  Due to Covid, a mask is required upon entering the hospital/clinic. If you do not have a mask, one will be given to you upon arrival. For doctor visits, patients may have 1 support person aged 19 or older with them. For treatment visits, patients cannot have anyone with them due to current Covid guidelines and our immunocompromised population.

## 2021-01-07 ENCOUNTER — Ambulatory Visit: Payer: 59

## 2021-01-07 ENCOUNTER — Ambulatory Visit
Admission: RE | Admit: 2021-01-07 | Discharge: 2021-01-07 | Disposition: A | Discharge status: Home / Self Care | Payer: 59 | Source: Ambulatory Visit | Attending: Radiation Oncology | Admitting: Radiation Oncology

## 2021-01-07 DIAGNOSIS — C50212 Malignant neoplasm of upper-inner quadrant of left female breast: Secondary | ICD-10-CM

## 2021-01-07 DIAGNOSIS — M25612 Stiffness of left shoulder, not elsewhere classified: Secondary | ICD-10-CM

## 2021-01-07 DIAGNOSIS — R293 Abnormal posture: Secondary | ICD-10-CM

## 2021-01-07 DIAGNOSIS — Z170 Estrogen receptor positive status [ER+]: Secondary | ICD-10-CM

## 2021-01-07 NOTE — Therapy (Signed)
[place], [place], [address] Phone: [phone]   Fax:  [phone]  Physical Therapy Treatment  Patient Details  Name: Robin Kennedy MRN: [id number] Date of Birth: [birth year] Referring Provider (PT): Dr. [name]   Encounter Date: 01/07/2021   PT End of Session - 01/07/21 0811    Visit Number 11    Number of Visits 22    Date for PT Re-Evaluation 02/16/21    PT Start Time 0802    PT Stop Time 0846    PT Time Calculation (min) 44 min    Activity Tolerance Patient tolerated treatment well    Behavior During Therapy [facility] for tasks assessed/performed           Past Medical History:  Diagnosis Date  . Diabetes mellitus without complication ([place])   . Personal history of chemotherapy   . SVD (spontaneous vaginal delivery)    X 3    Past Surgical History:  Procedure Laterality Date  . BREAST BIOPSY Left 06/02/2020   x2  . BREAST BIOPSY Right 06/22/2020  . CYSTOSCOPY N/A 12/20/2016   Procedure: CYSTOSCOPY;  Surgeon: Lavonia Drafts, MD;  Location: [facility];  Service: Gynecology;  Laterality: N/A;  . MODIFIED MASTECTOMY Left 11/09/2020   Procedure: LEFT MODIFIED MASTECTOMY WITH LYMPH NODE DISSECTION;  Surgeon: [name], MD;  Location: [place];  Service: General;  Laterality: Left;  . PORTACATH PLACEMENT Left 06/23/2020   Procedure: INSERTION PORT-A-CATH WITH ULTRASOUND GUIDANCE;  Surgeon: [name], MD;  Location: [place];  Service: General;  Laterality: Left;  . RADIOACTIVE SEED GUIDED EXCISIONAL BREAST BIOPSY Right 11/09/2020   Procedure: RIGHT BREAST SEED LOCALIZED EXCISIONAL BIOPSY;  Surgeon: [name], MD;  Location: [place];  Service: General;  Laterality: Right;  . ROBOTIC ASSISTED TOTAL HYSTERECTOMY WITH BILATERAL SALPINGO OOPHERECTOMY Bilateral 12/20/2016   Procedure: ROBOTIC ASSISTED TOTAL HYSTERECTOMY WITH  BILATERAL SALPINGO OOPHORECTOMY;  Surgeon: Lavonia Drafts, MD;  Location: [facility];  Service: Gynecology;  Laterality: Bilateral;  . TUBAL LIGATION    . WISDOM TOOTH EXTRACTION      There were no vitals filed for this visit.   Subjective Assessment - 01/07/21 0803    Subjective I had another infusion yesterday so today I am really just feeling super tired. My Lt shoulder is feelig pretty good. I still feel the pull in my Lt axilla but it's about 89% better since starting physical therapy.    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 It is ER positive, PR negative, and HER2 positive with a Ki67 of 60%. She underwent neoadjuvant chemotherapy 06/25/2020-10/07/2020 followed by a right lumpectomy and a left mastectomy with an axillary lymph node dissection (19 negative nodes removed) on 11/09/2020. She has type II diabetes.    Patient Stated Goals Get my arm moving again    Currently in Pain? No/denies                             [facility] Adult PT Treatment/Exercise - 01/07/21 0001      Shoulder Exercises: Pulleys   Flexion 2 minutes    Flexion Limitations pt able to self correct compensations    ABduction 2 minutes    ABduction Limitations pt able to self correct      Manual Therapy   Manual Therapy Myofascial release;Passive ROM    Myofascial Release  to left axillary region where cording only mildly palpable today    Passive ROM PROM left shoulder flex, abduction, D2 flexion, also abd with er to work on radiation [place] - 01/05/21 [place] #1   Title Patient will demonstrate she has regained full shoulder ROM and function post op compared to baseline assessments.    Status On-going      PT LONG TERM GOAL #2   Title Patient will increase left shoulder flexion AROM to >/= 160 degrees for increased ease reaching  overhead.    Baseline 42 post op / 151 pre-op; 154 degrees - 01/05/21    Time 6    Period Weeks    Status Revised      PT LONG TERM GOAL #3   Title Patient will increase left shoulder AROM abduction to >/= 160 degrees for increased ability to obtain radiation positioning.    Baseline 64 degrees post op / 167 pre-op; 150 degrees - 01/05/21    Time 6    Period Weeks    Status Revised      PT LONG TERM GOAL #4   Title Patient will improve her DASH score to </= 10 for improved overall UE function.    Baseline 4.55 pre-op; 75 post op; 11.83 - 01/05/21    Time 6    Period Weeks    Status Revised      PT LONG TERM GOAL #5   Title Patient will verbalize understnading of lymphedema risk reduction practices.    Baseline Pt has met this goal as she attended the ABC class and questions were answered for this at the following session later that same afternoon - 01/05/21                 Plan - 01/07/21 0811    Clinical Impression Statement Pt had another infusion yesterday so had general feeling of malaise today. Tolerated pulleys but due to not feeling well did not progress upright exercises and focused rest of session on manual therapy so pt could be supine. Her only complaint now is pulling in Lt axilla though rates this about 80% improved overall since start of care.    Stability/Clinical Decision Making Stable/Uncomplicated    Rehab Potential Excellent    PT Frequency 2x / week    PT Duration 6 weeks    PT Treatment/Interventions ADLs/Self Care Home Management;Therapeutic exercise;Patient/family education;Manual techniques;Manual lymph drainage;Passive range of motion;Scar mobilization    PT Next Visit Plan cont manual therapy to decrease Lt axillary and chest wall tightness along with mild cording into upper arm; progress HEP for postural strength but cautioning pt about not overdoing strengthening during radiation    PT Home Exercise Plan Post op shoulder ROM HEP, supine wand flex and  scaption, doorway stretches for end flexion and abduction; supine sacpular series with yellow theraband    Consulted and Agree with Plan of Care Patient           Patient will benefit from skilled therapeutic intervention in order to improve the following deficits and impairments:  Postural dysfunction,Decreased knowledge of precautions,Impaired UE functional use,Pain,Decreased range of motion,Decreased scar mobility  Visit Diagnosis: Malignant neoplasm of upper-inner quadrant of left breast in female, estrogen receptor positive ([facility])  Abnormal posture  Stiffness of left shoulder, not elsewhere  classified     Problem List Patient Active Problem List   Diagnosis Date Noted  . Breast cancer metastasized to axillary lymph node, left ([place]) 11/09/2020  . Port-A-Cath in place 07/02/2020  . Genetic testing 06/29/2020  . Malignant neoplasm of upper-inner quadrant of left breast in female, estrogen receptor positive ([place]) 06/05/2020  . Post-operative state 12/20/2016  . Fibroids 12/06/2016  . Pelvic pain in female 12/06/2016  . Diabetes mellitus ([place]) 06/19/2012    Otelia Limes, PTA 01/07/2021, 8:52 AM  [place], [place], [address] Phone: [phone]   Fax:  [phone]  Name: Janis Sol MRN: [id number] Date of Birth: 07/27/1976

## 2021-01-08 ENCOUNTER — Other Ambulatory Visit: Payer: Self-pay

## 2021-01-08 ENCOUNTER — Ambulatory Visit: Payer: 59

## 2021-01-08 ENCOUNTER — Ambulatory Visit
Admission: RE | Admit: 2021-01-08 | Discharge: 2021-01-08 | Disposition: A | Discharge status: Home / Self Care | Payer: 59 | Source: Ambulatory Visit | Attending: Radiation Oncology | Admitting: Radiation Oncology

## 2021-01-08 DIAGNOSIS — C50212 Malignant neoplasm of upper-inner quadrant of left female breast: Secondary | ICD-10-CM | POA: Diagnosis not present

## 2021-01-12 ENCOUNTER — Ambulatory Visit: Payer: 59 | Attending: General Surgery

## 2021-01-12 ENCOUNTER — Ambulatory Visit: Payer: 59

## 2021-01-12 ENCOUNTER — Ambulatory Visit
Admission: RE | Admit: 2021-01-12 | Discharge: 2021-01-12 | Disposition: A | Discharge status: Home / Self Care | Payer: 59 | Source: Ambulatory Visit | Attending: Radiation Oncology | Admitting: Radiation Oncology

## 2021-01-12 ENCOUNTER — Telehealth: Payer: Self-pay | Admitting: Hematology and Oncology

## 2021-01-12 ENCOUNTER — Other Ambulatory Visit: Payer: Self-pay

## 2021-01-12 DIAGNOSIS — M25612 Stiffness of left shoulder, not elsewhere classified: Secondary | ICD-10-CM

## 2021-01-12 DIAGNOSIS — Z170 Estrogen receptor positive status [ER+]: Secondary | ICD-10-CM

## 2021-01-12 DIAGNOSIS — C50212 Malignant neoplasm of upper-inner quadrant of left female breast: Secondary | ICD-10-CM

## 2021-01-12 DIAGNOSIS — R293 Abnormal posture: Secondary | ICD-10-CM

## 2021-01-12 NOTE — Telephone Encounter (Signed)
R/s per prov pal, per 5/4 los, pt aware

## 2021-01-12 NOTE — Therapy (Signed)
[place], [place], [address] Phone: [phone]   Fax:  [phone]  Physical Therapy Treatment  Patient Details  Name: Robin Kennedy MRN: [id number] Date of Birth: Jan 25, 1976 Referring Provider (PT): Dr. [name]   Encounter Date: 01/12/2021   PT End of Session - 01/12/21 0815    Visit Number 12    Number of Visits 22    Date for PT Re-Evaluation 02/16/21    PT Start Time 0807    PT Stop Time 0901    PT Time Calculation (min) 54 min    Activity Tolerance Patient tolerated treatment well    Behavior During Therapy [facility] for tasks assessed/performed           Past Medical History:  Diagnosis Date  . Diabetes mellitus without complication ([place])   . Personal history of chemotherapy   . SVD (spontaneous vaginal delivery)    X 3    Past Surgical History:  Procedure Laterality Date  . BREAST BIOPSY Left 06/02/2020   x2  . BREAST BIOPSY Right 06/22/2020  . CYSTOSCOPY N/A 12/20/2016   Procedure: CYSTOSCOPY;  Surgeon: Lavonia Drafts, MD;  Location: [facility];  Service: Gynecology;  Laterality: N/A;  . MODIFIED MASTECTOMY Left 11/09/2020   Procedure: LEFT MODIFIED MASTECTOMY WITH LYMPH NODE DISSECTION;  Surgeon: [name], MD;  Location: [place];  Service: General;  Laterality: Left;  . PORTACATH PLACEMENT Left 06/23/2020   Procedure: INSERTION PORT-A-CATH WITH ULTRASOUND GUIDANCE;  Surgeon: [name], MD;  Location: [place];  Service: General;  Laterality: Left;  . RADIOACTIVE SEED GUIDED EXCISIONAL BREAST BIOPSY Right 11/09/2020   Procedure: RIGHT BREAST SEED LOCALIZED EXCISIONAL BIOPSY;  Surgeon: [name], MD;  Location: [place];  Service: General;  Laterality: Right;  . ROBOTIC ASSISTED TOTAL HYSTERECTOMY WITH BILATERAL SALPINGO OOPHERECTOMY Bilateral 12/20/2016   Procedure: ROBOTIC ASSISTED TOTAL HYSTERECTOMY WITH  BILATERAL SALPINGO OOPHORECTOMY;  Surgeon: Lavonia Drafts, MD;  Location: [facility];  Service: Gynecology;  Laterality: Bilateral;  . TUBAL LIGATION    . WISDOM TOOTH EXTRACTION      There were no vitals filed for this visit.   Subjective Assessment - 01/12/21 0809    Subjective I don't feel great today. I sat outside for awhile yesterday and so my allergies are bad today.    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 It is ER positive, PR negative, and HER2 positive with a Ki67 of 60%. She underwent neoadjuvant chemotherapy 06/25/2020-10/07/2020 followed by a right lumpectomy and a left mastectomy with an axillary lymph node dissection (19 negative nodes removed) on 11/09/2020. She has type II diabetes.    Patient Stated Goals Get my arm moving again    Currently in Pain? No/denies                             [facility] Adult PT Treatment/Exercise - 01/12/21 0001      Exercises   Exercises Other Exercises    Other Exercises  Squats in front of chair holding 5# dumbell with both hands at chest x10 returning therapist demo      Shoulder Exercises: Standing   Other Standing Exercises Bil UE 3 way raises with 2# x10 each returning therapist demo of flexion, scaption and abduction with erect posture against wall      Shoulder Exercises: Pulleys   Flexion  2 minutes    Flexion Limitations Pt demonstrates good technique with these today    ABduction 2 minutes      Shoulder Exercises: Therapy Ball   Flexion Both;10 reps   forward lead into end of stretch     Manual Therapy   Manual Therapy Myofascial release;Passive ROM    Myofascial Release to left axillary region where cording only mildly palpable today    Passive ROM PROM left shoulder flex, abduction, D2, also abd with er to work on radiation postioning                       PT Long Term Goals - 01/05/21 [place] #1   Title Patient will demonstrate she has regained full shoulder ROM and function post op compared to baseline assessments.    Status On-going      PT LONG TERM GOAL #2   Title Patient will increase left shoulder flexion AROM to >/= 160 degrees for increased ease reaching overhead.    Baseline 42 post op / 151 pre-op; 154 degrees - 01/05/21    Time 6    Period Weeks    Status Revised      PT LONG TERM GOAL #3   Title Patient will increase left shoulder AROM abduction to >/= 160 degrees for increased ability to obtain radiation positioning.    Baseline 64 degrees post op / 167 pre-op; 150 degrees - 01/05/21    Time 6    Period Weeks    Status Revised      PT LONG TERM GOAL #4   Title Patient will improve her DASH score to </= 10 for improved overall UE function.    Baseline 4.55 pre-op; 75 post op; 11.83 - 01/05/21    Time 6    Period Weeks    Status Revised      PT LONG TERM GOAL #5   Title Patient will verbalize understnading of lymphedema risk reduction practices.    Baseline Pt has met this goal as she attended the ABC class and questions were answered for this at the following session later that same afternoon - 01/05/21                 Plan - 01/12/21 0815    Clinical Impression Statement Pt returns to therapy today reporting she rested most of weekend as she was feeling fatigue and malaise from her chemo infusion last week. Progressed her today to include squats with dumbell and then increased weights with bil UE 3 way raises which she did well with and had no increased discomfort. Also continued with manual therapy focusing on decreasing Lt axillary tightness and improving end P/ROM of Lt shoulder.    Stability/Clinical Decision Making Stable/Uncomplicated    Rehab Potential Excellent    PT Frequency 2x / week    PT Duration 6 weeks    PT Treatment/Interventions ADLs/Self Care Home Management;Therapeutic exercise;Patient/family education;Manual techniques;Manual  lymph drainage;Passive range of motion;Scar mobilization    PT Next Visit Plan cont manual therapy to decrease Lt axillary and chest wall tightness along with mild cording into upper arm; progress HEP for postural strength but cautioning pt about not overdoing strengthening during radiation    PT Home Exercise Plan Post op shoulder ROM HEP, supine wand flex and scaption, doorway stretches for end flexion and abduction; supine sacpular series with yellow theraband    Consulted and Agree with  Plan of Care Patient           Patient will benefit from skilled therapeutic intervention in order to improve the following deficits and impairments:  Postural dysfunction,Decreased knowledge of precautions,Impaired UE functional use,Pain,Decreased range of motion,Decreased scar mobility  Visit Diagnosis: Malignant neoplasm of upper-inner quadrant of left breast in female, estrogen receptor positive (HCC)  Abnormal posture  Stiffness of left shoulder, not elsewhere classified     Problem List Patient Active Problem List   Diagnosis Date Noted  . Breast cancer metastasized to axillary lymph node, left ([place]) 11/09/2020  . Port-A-Cath in place 07/02/2020  . Genetic testing 06/29/2020  . Malignant neoplasm of upper-inner quadrant of left breast in female, estrogen receptor positive ([place]) 06/05/2020  . Post-operative state 12/20/2016  . Fibroids 12/06/2016  . Pelvic pain in female 12/06/2016  . Diabetes mellitus ([place]) 06/19/2012    Otelia Limes, PTA 01/12/2021, 9:03 AM  [place], [place], [address] Phone: [phone]   Fax:  [phone]  Name: Robin Kennedy MRN: [id number] Date of Birth: 1976-06-24

## 2021-01-13 ENCOUNTER — Ambulatory Visit
Admission: RE | Admit: 2021-01-13 | Discharge: 2021-01-13 | Disposition: A | Discharge status: Home / Self Care | Payer: 59 | Source: Ambulatory Visit | Attending: Radiation Oncology | Admitting: Radiation Oncology

## 2021-01-13 ENCOUNTER — Ambulatory Visit: Payer: 59

## 2021-01-13 DIAGNOSIS — C50212 Malignant neoplasm of upper-inner quadrant of left female breast: Secondary | ICD-10-CM | POA: Diagnosis present

## 2021-01-13 DIAGNOSIS — M25612 Stiffness of left shoulder, not elsewhere classified: Secondary | ICD-10-CM | POA: Insufficient documentation

## 2021-01-13 DIAGNOSIS — Z95828 Presence of other vascular implants and grafts: Secondary | ICD-10-CM | POA: Diagnosis present

## 2021-01-13 DIAGNOSIS — Z170 Estrogen receptor positive status [ER+]: Secondary | ICD-10-CM | POA: Insufficient documentation

## 2021-01-13 DIAGNOSIS — R293 Abnormal posture: Secondary | ICD-10-CM | POA: Insufficient documentation

## 2021-01-14 ENCOUNTER — Ambulatory Visit: Payer: 59

## 2021-01-14 ENCOUNTER — Ambulatory Visit
Admission: RE | Admit: 2021-01-14 | Discharge: 2021-01-14 | Disposition: A | Discharge status: Home / Self Care | Payer: 59 | Source: Ambulatory Visit | Attending: Radiation Oncology | Admitting: Radiation Oncology

## 2021-01-14 ENCOUNTER — Other Ambulatory Visit: Payer: Self-pay

## 2021-01-14 DIAGNOSIS — M25612 Stiffness of left shoulder, not elsewhere classified: Secondary | ICD-10-CM | POA: Diagnosis not present

## 2021-01-15 ENCOUNTER — Ambulatory Visit
Admission: RE | Admit: 2021-01-15 | Discharge: 2021-01-15 | Disposition: A | Discharge status: Home / Self Care | Payer: 59 | Source: Ambulatory Visit | Attending: Radiation Oncology | Admitting: Radiation Oncology

## 2021-01-15 ENCOUNTER — Ambulatory Visit: Payer: 59

## 2021-01-15 DIAGNOSIS — M25612 Stiffness of left shoulder, not elsewhere classified: Secondary | ICD-10-CM | POA: Diagnosis not present

## 2021-01-18 ENCOUNTER — Ambulatory Visit: Payer: 59

## 2021-01-18 ENCOUNTER — Ambulatory Visit
Admission: RE | Admit: 2021-01-18 | Discharge: 2021-01-18 | Disposition: A | Discharge status: Home / Self Care | Payer: 59 | Source: Ambulatory Visit | Attending: Radiation Oncology | Admitting: Radiation Oncology

## 2021-01-18 ENCOUNTER — Other Ambulatory Visit: Payer: Self-pay

## 2021-01-18 DIAGNOSIS — R293 Abnormal posture: Secondary | ICD-10-CM | POA: Insufficient documentation

## 2021-01-18 DIAGNOSIS — Z170 Estrogen receptor positive status [ER+]: Secondary | ICD-10-CM | POA: Insufficient documentation

## 2021-01-18 DIAGNOSIS — M25612 Stiffness of left shoulder, not elsewhere classified: Secondary | ICD-10-CM | POA: Diagnosis not present

## 2021-01-18 DIAGNOSIS — C50212 Malignant neoplasm of upper-inner quadrant of left female breast: Secondary | ICD-10-CM | POA: Insufficient documentation

## 2021-01-18 NOTE — Therapy (Signed)
[place], [place], [address] Phone: [phone]   Fax:  [phone]  Physical Therapy Treatment  Patient Details  Name: Colette Dicamillo MRN: [id number] Date of Birth: 12/09/1975 Referring Provider (PT): Dr. [name]   Encounter Date: 01/18/2021   PT End of Session - 01/18/21 1653    Visit Number 13    Number of Visits 22    Date for PT Re-Evaluation 02/16/21    PT Start Time 1602    PT Stop Time 1658    PT Time Calculation (min) 56 min    Activity Tolerance Patient tolerated treatment well    Behavior During Therapy [facility] for tasks assessed/performed           Past Medical History:  Diagnosis Date  . Diabetes mellitus without complication ([place])   . Personal history of chemotherapy   . SVD (spontaneous vaginal delivery)    X 3    Past Surgical History:  Procedure Laterality Date  . BREAST BIOPSY Left 06/02/2020   x2  . BREAST BIOPSY Right 06/22/2020  . CYSTOSCOPY N/A 12/20/2016   Procedure: CYSTOSCOPY;  Surgeon: Lavonia Drafts, MD;  Location: [facility];  Service: Gynecology;  Laterality: N/A;  . MODIFIED MASTECTOMY Left 11/09/2020   Procedure: LEFT MODIFIED MASTECTOMY WITH LYMPH NODE DISSECTION;  Surgeon: [name], MD;  Location: [place];  Service: General;  Laterality: Left;  . PORTACATH PLACEMENT Left 06/23/2020   Procedure: INSERTION PORT-A-CATH WITH ULTRASOUND GUIDANCE;  Surgeon: [name], MD;  Location: [place];  Service: General;  Laterality: Left;  . RADIOACTIVE SEED GUIDED EXCISIONAL BREAST BIOPSY Right 11/09/2020   Procedure: RIGHT BREAST SEED LOCALIZED EXCISIONAL BIOPSY;  Surgeon: [name], MD;  Location: [place];  Service: General;  Laterality: Right;  . ROBOTIC ASSISTED TOTAL HYSTERECTOMY WITH BILATERAL SALPINGO OOPHERECTOMY Bilateral 12/20/2016   Procedure: ROBOTIC ASSISTED TOTAL HYSTERECTOMY WITH  BILATERAL SALPINGO OOPHORECTOMY;  Surgeon: Lavonia Drafts, MD;  Location: [facility];  Service: Gynecology;  Laterality: Bilateral;  . TUBAL LIGATION    . WISDOM TOOTH EXTRACTION      There were no vitals filed for this visit.   Subjective Assessment - 01/18/21 1613    Subjective I saw Dr. Isidore Moos today. She said she can tell my skin is starting to get darker so to be sure to start using my cream after my radiation treatments.    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 It is ER positive, PR negative, and HER2 positive with a Ki67 of 60%. She underwent neoadjuvant chemotherapy 06/25/2020-10/07/2020 followed by a right lumpectomy and a left mastectomy with an axillary lymph node dissection (19 negative nodes removed) on 11/09/2020. She has type II diabetes.    Patient Stated Goals Get my arm moving again    Currently in Pain? No/denies                             [facility] Adult PT Treatment/Exercise - 01/18/21 0001      Shoulder Exercises: Pulleys   ABduction 2 minutes      Shoulder Exercises: Therapy Ball   Flexion Both;10 reps   forward lean into end of stretch   ABduction Left;10 reps   same side lean into end of stretch     Shoulder Exercises: Stretch   Corner Stretch 3 reps;20 seconds   in doorway  Table Stretch - Abduction 5 reps   modified downward dog, 5 sec holds     Manual Therapy   Manual Therapy Myofascial release;Passive ROM    Myofascial Release to left axillary region where cording only mildly palpable today    Passive ROM PROM left shoulder flex, abduction, D2, also abd with er to work on radiation postioning                       PT Long Term Goals - 01/05/21 [place] #1   Title Patient will demonstrate she has regained full shoulder ROM and function post op compared to baseline assessments.    Status On-going      PT LONG TERM GOAL #2    Title Patient will increase left shoulder flexion AROM to >/= 160 degrees for increased ease reaching overhead.    Baseline 42 post op / 151 pre-op; 154 degrees - 01/05/21    Time 6    Period Weeks    Status Revised      PT LONG TERM GOAL #3   Title Patient will increase left shoulder AROM abduction to >/= 160 degrees for increased ability to obtain radiation positioning.    Baseline 64 degrees post op / 167 pre-op; 150 degrees - 01/05/21    Time 6    Period Weeks    Status Revised      PT LONG TERM GOAL #4   Title Patient will improve her DASH score to </= 10 for improved overall UE function.    Baseline 4.55 pre-op; 75 post op; 11.83 - 01/05/21    Time 6    Period Weeks    Status Revised      PT LONG TERM GOAL #5   Title Patient will verbalize understnading of lymphedema risk reduction practices.    Baseline Pt has met this goal as she attended the ABC class and questions were answered for this at the following session later that same afternoon - 01/05/21                 Plan - 01/18/21 1654    Clinical Impression Statement Pts skin is starting to darken due to radiation and she was cautioned by Dr. Isidore Moos to start using her radioplex cream 3x/day. Instructed pt that we may need to place her on hold if her skin changes progress. She verbalized understanding. Continued with manual therapy focusing on improving her Lt shoulder end ROM.    Examination-Activity Limitations Sit    Stability/Clinical Decision Making Stable/Uncomplicated    Rehab Potential Excellent    PT Frequency 2x / week    PT Duration 6 weeks    PT Treatment/Interventions ADLs/Self Care Home Management;Therapeutic exercise;Patient/family education;Manual techniques;Manual lymph drainage;Passive range of motion;Scar mobilization    PT Next Visit Plan Do [facility] screen next; May have to place pt on hold if skin changes progress due to skin fragility; cont manual therapy to decrease Lt axillary and chest wall tightness  along with mild cording into upper arm; progress HEP for postural strength but cautioning pt about not overdoing strengthening during radiation    PT Home Exercise Plan Post op shoulder ROM HEP, supine wand flex and scaption, doorway stretches for end flexion and abduction; supine sacpular series with yellow theraband    Consulted and Agree with Plan of Care Patient           Patient will benefit from skilled  therapeutic intervention in order to improve the following deficits and impairments:  Postural dysfunction,Decreased knowledge of precautions,Impaired UE functional use,Pain,Decreased range of motion,Decreased scar mobility  Visit Diagnosis: Malignant neoplasm of upper-inner quadrant of left breast in female, estrogen receptor positive ([place])  Abnormal posture  Stiffness of left shoulder, not elsewhere classified     Problem List Patient Active Problem List   Diagnosis Date Noted  . Breast cancer metastasized to axillary lymph node, left ([place]) 11/09/2020  . Port-A-Cath in place 07/02/2020  . Genetic testing 06/29/2020  . Malignant neoplasm of upper-inner quadrant of left breast in female, estrogen receptor positive ([place]) 06/05/2020  . Post-operative state 12/20/2016  . Fibroids 12/06/2016  . Pelvic pain in female 12/06/2016  . Diabetes mellitus ([place]) 06/19/2012    Otelia Limes, PTA 01/18/2021, 5:07 PM  [place], [place], [address] Phone: [phone]   Fax:  [phone]  Name: Daenerys Buttram MRN: [id number] Date of Birth: [birth year]

## 2021-01-19 ENCOUNTER — Ambulatory Visit
Admission: RE | Admit: 2021-01-19 | Discharge: 2021-01-19 | Disposition: A | Discharge status: Home / Self Care | Payer: 59 | Source: Ambulatory Visit | Attending: Radiation Oncology | Admitting: Radiation Oncology

## 2021-01-19 ENCOUNTER — Ambulatory Visit: Payer: 59

## 2021-01-19 DIAGNOSIS — M25612 Stiffness of left shoulder, not elsewhere classified: Secondary | ICD-10-CM | POA: Diagnosis not present

## 2021-01-20 ENCOUNTER — Ambulatory Visit
Admission: RE | Admit: 2021-01-20 | Discharge: 2021-01-20 | Disposition: A | Discharge status: Home / Self Care | Payer: 59 | Source: Ambulatory Visit | Attending: Radiation Oncology | Admitting: Radiation Oncology

## 2021-01-20 ENCOUNTER — Ambulatory Visit: Payer: 59

## 2021-01-20 ENCOUNTER — Other Ambulatory Visit: Payer: Self-pay

## 2021-01-20 DIAGNOSIS — M25612 Stiffness of left shoulder, not elsewhere classified: Secondary | ICD-10-CM | POA: Diagnosis not present

## 2021-01-21 ENCOUNTER — Ambulatory Visit: Payer: 59

## 2021-01-21 ENCOUNTER — Ambulatory Visit
Admission: RE | Admit: 2021-01-21 | Discharge: 2021-01-21 | Disposition: A | Discharge status: Home / Self Care | Payer: 59 | Source: Ambulatory Visit | Attending: Radiation Oncology | Admitting: Radiation Oncology

## 2021-01-21 DIAGNOSIS — M25612 Stiffness of left shoulder, not elsewhere classified: Secondary | ICD-10-CM | POA: Diagnosis not present

## 2021-01-22 ENCOUNTER — Ambulatory Visit
Admission: RE | Admit: 2021-01-22 | Discharge: 2021-01-22 | Disposition: A | Discharge status: Home / Self Care | Payer: 59 | Source: Ambulatory Visit | Attending: Radiation Oncology | Admitting: Radiation Oncology

## 2021-01-22 ENCOUNTER — Ambulatory Visit: Payer: 59

## 2021-01-22 ENCOUNTER — Other Ambulatory Visit: Payer: Self-pay

## 2021-01-22 DIAGNOSIS — M25612 Stiffness of left shoulder, not elsewhere classified: Secondary | ICD-10-CM | POA: Diagnosis not present

## 2021-01-25 ENCOUNTER — Ambulatory Visit: Payer: 59

## 2021-01-25 ENCOUNTER — Ambulatory Visit: Payer: 59 | Admitting: Radiation Oncology

## 2021-01-25 ENCOUNTER — Ambulatory Visit: Payer: Self-pay | Admitting: Physical Therapy

## 2021-01-25 ENCOUNTER — Other Ambulatory Visit: Payer: Self-pay

## 2021-01-25 ENCOUNTER — Ambulatory Visit
Admission: RE | Admit: 2021-01-25 | Discharge: 2021-01-25 | Disposition: A | Discharge status: Home / Self Care | Payer: 59 | Source: Ambulatory Visit | Attending: Radiation Oncology | Admitting: Radiation Oncology

## 2021-01-25 DIAGNOSIS — M25612 Stiffness of left shoulder, not elsewhere classified: Secondary | ICD-10-CM | POA: Diagnosis not present

## 2021-01-26 ENCOUNTER — Encounter: Payer: Self-pay | Admitting: Physical Therapy

## 2021-01-26 ENCOUNTER — Other Ambulatory Visit: Payer: Self-pay

## 2021-01-26 ENCOUNTER — Ambulatory Visit: Payer: 59

## 2021-01-26 ENCOUNTER — Ambulatory Visit
Admission: RE | Admit: 2021-01-26 | Discharge: 2021-01-26 | Disposition: A | Discharge status: Home / Self Care | Payer: 59 | Source: Ambulatory Visit | Attending: Radiation Oncology | Admitting: Radiation Oncology

## 2021-01-26 ENCOUNTER — Ambulatory Visit: Payer: 59 | Admitting: Physical Therapy

## 2021-01-26 DIAGNOSIS — M25612 Stiffness of left shoulder, not elsewhere classified: Secondary | ICD-10-CM | POA: Diagnosis not present

## 2021-01-26 DIAGNOSIS — C50212 Malignant neoplasm of upper-inner quadrant of left female breast: Secondary | ICD-10-CM

## 2021-01-26 DIAGNOSIS — R293 Abnormal posture: Secondary | ICD-10-CM

## 2021-01-26 NOTE — Therapy (Signed)
[place], [place], [address] Phone: [phone]   Fax:  [phone]  Physical Therapy Treatment  Patient Details  Name: Robin Kennedy MRN: [id number] Date of Birth: [birth year] Referring Provider (PT): Dr. [name]   Encounter Date: 01/26/2021   PT End of Session - 01/26/21 1454     Visit Number 14    Number of Visits 22    Date for PT Re-Evaluation 02/16/21    PT Start Time 1408    PT Stop Time 3817    PT Time Calculation (min) 45 min    Activity Tolerance Patient tolerated treatment well    Behavior During Therapy [facility] for tasks assessed/performed             Past Medical History:  Diagnosis Date   Diabetes mellitus without complication ([place])    Personal history of chemotherapy    SVD (spontaneous vaginal delivery)    X 3    Past Surgical History:  Procedure Laterality Date   BREAST BIOPSY Left 06/02/2020   x2   BREAST BIOPSY Right 06/22/2020   CYSTOSCOPY N/A 12/20/2016   Procedure: CYSTOSCOPY;  Surgeon: Lavonia Drafts, MD;  Location: [facility];  Service: Gynecology;  Laterality: N/A;   MODIFIED MASTECTOMY Left 11/09/2020   Procedure: LEFT MODIFIED MASTECTOMY WITH LYMPH NODE DISSECTION;  Surgeon: [name], MD;  Location: [facility];  Service: General;  Laterality: Left;   PORTACATH PLACEMENT Left 06/23/2020   Procedure: INSERTION PORT-A-CATH WITH ULTRASOUND GUIDANCE;  Surgeon: [name], MD;  Location: [facility];  Service: General;  Laterality: Left;   RADIOACTIVE SEED GUIDED EXCISIONAL BREAST BIOPSY Right 11/09/2020   Procedure: RIGHT BREAST SEED LOCALIZED EXCISIONAL BIOPSY;  Surgeon: [name], MD;  Location: [facility];  Service: General;  Laterality: Right;   ROBOTIC ASSISTED TOTAL HYSTERECTOMY WITH BILATERAL SALPINGO OOPHERECTOMY Bilateral 12/20/2016   Procedure: ROBOTIC ASSISTED TOTAL HYSTERECTOMY WITH BILATERAL  SALPINGO OOPHORECTOMY;  Surgeon: Lavonia Drafts, MD;  Location: [facility];  Service: Gynecology;  Laterality: Bilateral;   TUBAL LIGATION     WISDOM TOOTH EXTRACTION      There were no vitals filed for this visit.   Subjective Assessment - 01/26/21 1409     Subjective The cording is doing better and my shoulder feels pretty good. My skin is getting red.    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 It is ER positive, PR negative, and HER2 positive with a Ki67 of 60%. She underwent neoadjuvant chemotherapy 06/25/2020-10/07/2020 followed by a right lumpectomy and a left mastectomy with an axillary lymph node dissection (19 negative nodes removed) on 11/09/2020. She has type II diabetes.    Patient Stated Goals Get my arm moving again    Currently in Pain? No/denies    Pain Score 0-No pain                    L-DEX FLOWSHEETS - 01/26/21 1400       L-DEX LYMPHEDEMA SCREENING   Measurement Type Unilateral    L-DEX MEASUREMENT EXTREMITY Upper Extremity    POSITION  Standing    DOMINANT SIDE Right    At Risk Side Left    BASELINE SCORE (UNILATERAL) 4.7    L-DEX SCORE (UNILATERAL) 3    VALUE CHANGE (UNILAT) -1.7              The patient was assessed using the L-Dex  machine today to produce a lymphedema index baseline score. The patient will be reassessed on a regular basis (typically every 3 months) to obtain new L-Dex scores. If the score is > 6.5 points away from his/her baseline score indicating onset of subclinical lymphedema, it will be recommended to wear a compression garment for 4 weeks, 12 hours per day and then be reassessed. If the score continues to be > 6.5 points from baseline at reassessment, we will initiate lymphedema treatment. Assessing in this manner has a 95% rate of preventing clinically significant lymphedema.          [facility] Adult PT Treatment/Exercise - 01/26/21 0001        Shoulder Exercises: Pulleys   Flexion 2 minutes    ABduction 2 minutes      Shoulder Exercises: Therapy Ball   Flexion Both;10 reps   forward lean into end of stretch   ABduction Left;10 reps   same side lean into end of stretch     Manual Therapy   Myofascial Release to left axillary region no cording palpable today    Passive ROM briefly to L shoulder in to flexion and abduction- pt has regained near full AROM                         PT Long Term Goals - 01/05/21 1141       PT LONG TERM GOAL #1   Title Patient will demonstrate she has regained full shoulder ROM and function post op compared to baseline assessments.    Status On-going      PT LONG TERM GOAL #2   Title Patient will increase left shoulder flexion AROM to >/= 160 degrees for increased ease reaching overhead.    Baseline 42 post op / 151 pre-op; 154 degrees - 01/05/21    Time 6    Period Weeks    Status Revised      PT LONG TERM GOAL #3   Title Patient will increase left shoulder AROM abduction to >/= 160 degrees for increased ability to obtain radiation positioning.    Baseline 64 degrees post op / 167 pre-op; 150 degrees - 01/05/21    Time 6    Period Weeks    Status Revised      PT LONG TERM GOAL #4   Title Patient will improve her DASH score to </= 10 for improved overall UE function.    Baseline 4.55 pre-op; 75 post op; 11.83 - 01/05/21    Time 6    Period Weeks    Status Revised      PT LONG TERM GOAL #5   Title Patient will verbalize understnading of lymphedema risk reduction practices.    Baseline Pt has met this goal as she attended the ABC class and questions were answered for this at the following session later that same afternoon - 01/05/21                   Plan - 01/26/21 1455     Clinical Impression Statement Pt's skin is still dark from radiation but does not seem any worse. There are no open areas. Continued with AAROM exercises. Remeasured SOZO today and it had  decreased from baseline so there are no signs of subclinical lymphedema. Pt is progressing towards all goals and has regained her ROM. She would benefit from instruction in Strength ABC program prior to discharge from skilled PT services.    PT Frequency  2x / week    PT Duration 6 weeks    PT Treatment/Interventions ADLs/Self Care Home Management;Therapeutic exercise;Patient/family education;Manual techniques;Manual lymph drainage;Passive range of motion;Scar mobilization    PT Next Visit Plan instruct pt in strength ABC program and d/c if pt is ready    PT Home Exercise Plan Post op shoulder ROM HEP, supine wand flex and scaption, doorway stretches for end flexion and abduction; supine sacpular series with yellow theraband    Consulted and Agree with Plan of Care Patient             Patient will benefit from skilled therapeutic intervention in order to improve the following deficits and impairments:  Postural dysfunction, Decreased knowledge of precautions, Impaired UE functional use, Pain, Decreased range of motion, Decreased scar mobility  Visit Diagnosis: Stiffness of left shoulder, not elsewhere classified  Abnormal posture  Malignant neoplasm of upper-inner quadrant of left breast in female, estrogen receptor positive ([place])     Problem List Patient Active Problem List   Diagnosis Date Noted   Breast cancer metastasized to axillary lymph node, left ([place]) 11/09/2020   Port-A-Cath in place 07/02/2020   Genetic testing 06/29/2020   Malignant neoplasm of upper-inner quadrant of left breast in female, estrogen receptor positive ([place]) 06/05/2020   Post-operative state 12/20/2016   Fibroids 12/06/2016   Pelvic pain in female 12/06/2016   Diabetes mellitus ([place]) 06/19/2012    Allyson Sabal [facility] 01/26/2021, 2:57 PM  [place], [place], [address] Phone: [phone]   Fax:  [phone]  Name:  Robin Kennedy MRN: [id number] Date of Birth: [birth year]  Manus Gunning, PT 01/26/21 2:57 PM

## 2021-01-26 NOTE — Assessment & Plan Note (Signed)
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 Right breast benign-appearing calcifications previous stereotactic biopsy was attempted but it was too superficial and felt to be benign.  Recommendationbased on multidisciplinary tumor board: 1. Neoadjuvant chemotherapy with TCH Perjeta 6 cycles followed by Herceptin Perjeta maintenance for 1 year 2.11/09/20:Right lumpectomy and left mastectomy [name]): Right breast: no evidence of malignancy Left breast: no residual carcinoma, with 19 left axillary lymph nodes negative for carcinoma. 3. Followed by adjuvant radiation therapy(due to inflammatory breast changes at diagnosis) ------------------------------------------------------------------------------------------------------------------------------------------ 10/16/2020: CT CAP: Decrease in cutaneous thickening. Decrease in size of left axillary, subpectoral and supraclavicular lymph nodes, no distant metastatic disease.  Treatment plan: Herceptin Perjeta maintenance, referral to radiation She plans to go to [place] for her daughter's graduation ceremony May 14. Return to clinic every 3 weeks for Herceptin Perjeta and every 6 weeks with labs and follow-up with me.

## 2021-01-26 NOTE — Progress Notes (Signed)
Patient Care Team: Shawnee Knapp, MD (Family Medicine) Mauro Kaufmann, RN as Oncology Nurse Navigator Rockwell Germany, RN as Oncology Nurse Navigator [name], MD as Consulting Physician (General Surgery) Nicholas Lose, MD as Consulting Physician (Hematology and Oncology) Eppie Gibson, MD as Attending Physician (Radiation Oncology)  DIAGNOSIS:    ICD-10-CM   1. Malignant neoplasm of upper-inner quadrant of left breast in female, estrogen receptor positive ([place])  C50.212    Z17.0       SUMMARY OF ONCOLOGIC HISTORY: Oncology History  Malignant neoplasm of upper-inner quadrant of left breast in female, estrogen receptor positive ([place])  06/05/2020 Initial Diagnosis   06/05/2020: Enlarging left breast with pain and diffuse skin thickening: Mammogram revealed 3.5 cm mass 11 o'clock position with 3 abnormal lymph nodes: 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 Right breast benign-appearing calcifications previous stereotactic biopsy was attempted but it was too superficial and felt to be benign.   06/10/2020 Cancer Staging   Staging form: Breast, AJCC 8th Edition - Clinical stage from 06/10/2020: Stage IIIB (cT4d, cN3c, cM0, G3, ER+, PR-, HER2+) - Signed by Eppie Gibson, MD on 12/22/2020  Stage prefix: Initial diagnosis  Histologic grading system: 3 grade system  Laterality: Left  Staged by: Pathologist and managing physician  Stage used in treatment planning: Yes  National guidelines used in treatment planning: Yes  Type of national guideline used in treatment planning: NCCN    06/25/2020 - 10/09/2020 Chemotherapy   Neoadjuvant chemotherapy with [facility] Perjeta 6 cycles followed by Herceptin Perjeta maintenance for 1 year        06/29/2020 Genetic Testing   Negative genetic testing: no pathogenic variants detected in [facility] Common Hereditary Cancers Panel.  The report date is June 29, 2020.   The Common Hereditary  Cancers Panel offered by [facility] includes sequencing and/or deletion duplication testing of the following 48 genes: APC, ATM, AXIN2, BARD1, BMPR1A, BRCA1, BRCA2, BRIP1, CDH1, CDK4, CDKN2A (p14ARF), CDKN2A (p16INK4a), CHEK2, CTNNA1, DICER1, EPCAM (Deletion/duplication testing only), GREM1 (promoter region deletion/duplication testing only), KIT, MEN1, MLH1, MSH2, MSH3, MSH6, MUTYH, NBN, NF1, NHTL1, PALB2, PDGFRA, PMS2, POLD1, POLE, PTEN, RAD50, RAD51C, RAD51D, RNF43, SDHB, SDHC, SDHD, SMAD4, SMARCA4. STK11, TP53, TSC1, TSC2, and VHL.  The following genes were evaluated for sequence changes only: SDHA and HOXB13 c.251G>A variant only.   10/28/2020 -  Chemotherapy      Patient is on Antibody Plan: BREAST TRASTUZUMAB + PERTUZUMAB Q21D     11/09/2020 Surgery   Right lumpectomy and left mastectomy [name]):  Right breast: no evidence of malignancy Left breast: no residual carcinoma, with 19 left axillary lymph nodes negative for carcinoma.     CHIEF COMPLIANT: Herceptin Perjeta maintenance  INTERVAL HISTORY: Robin Kennedy is a 45 y.o. with above-mentioned history of inflammatory left breast cancer who completed neoadjuvant chemotherapy, right lumpectomy and left mastectomy, and is currently on Herceptin Perjeta maintenance. Echo on 12/25/20 showed an ejection fraction of 60-65%. She presents to the clinic today for treatment.  She has very occasional diarrhea.  ALLERGIES:  has No Known Allergies.  MEDICATIONS:  Current Outpatient Medications  Medication Sig Dispense Refill   glucose blood (ONE TOUCH ULTRA TEST) test strip Use to check cbgs qd 100 each 11   lidocaine-prilocaine (EMLA) cream Apply 1 application topically as needed. 30 g 1   metFORMIN (GLUCOPHAGE-XR) 500 MG 24 hr tablet Take 1,000 mg by mouth 2 (two) times daily.  3  methocarbamol (ROBAXIN) 500 MG tablet Take 1 tablet (500 mg total) by mouth every 6 (six) hours as needed for muscle spasms. 20 tablet 1   oxyCODONE (OXY  IR/ROXICODONE) 5 MG immediate release tablet Take 1 tablet (5 mg total) by mouth every 6 (six) hours as needed for severe pain. 15 tablet 0   potassium chloride SA (KLOR-CON) 20 MEQ tablet Take 1 tablet (20 mEq total) by mouth daily. 30 tablet 0   TRESIBA FLEXTOUCH 100 UNIT/ML FlexTouch Pen Inject 10 Units into the skin daily after breakfast.     No current facility-administered medications for this visit.    PHYSICAL EXAMINATION: ECOG PERFORMANCE STATUS: 1 - Symptomatic but completely ambulatory  Vitals:   01/27/21 0857  BP: 119/87  Pulse: 97  Resp: 18  Temp: 97.6 F (36.4 C)  SpO2: 100%   Filed Weights   01/27/21 0857  Weight: 180 lb 9.6 oz (81.9 kg)    LABORATORY DATA:  I have reviewed the data as listed CMP Latest Ref Rng & Units 12/16/2020 10/28/2020 10/07/2020  Glucose 70 - 99 mg/dL 157(H) 216(H) 296(H)  BUN 6 - 20 mg/dL '10 9 9  ' Creatinine 0.44 - 1.00 mg/dL 0.74 0.79 0.80  Sodium 135 - 145 mmol/L 144 139 140  Potassium 3.5 - 5.1 mmol/L 3.8 3.2(L) 3.6  Chloride 98 - 111 mmol/L 108 108 106  CO2 22 - 32 mmol/L '27 23 26  ' Calcium 8.9 - 10.3 mg/dL 9.6 8.7(L) 9.0  Total Protein 6.5 - 8.1 g/dL 6.8 6.2(L) 6.5  Total Bilirubin 0.3 - 1.2 mg/dL 0.4 0.4 0.5  Alkaline Phos 38 - 126 U/L 47 51 53  AST 15 - 41 U/L 9(L) 10(L) 10(L)  ALT 0 - 44 U/L '8 8 9    ' Lab Results  Component Value Date   WBC 4.0 01/27/2021   HGB 10.1 (L) 01/27/2021   HCT 31.5 (L) 01/27/2021   MCV 85.8 01/27/2021   PLT 221 01/27/2021   NEUTROABS 2.6 01/27/2021    ASSESSMENT & PLAN:  Malignant neoplasm of upper-inner quadrant of left breast in female, estrogen receptor positive ([place]) 06/05/2020: Enlarging left breast with pain and diffuse skin thickening: Mammogram revealed 3.5 cm mass 11 o'clock position with 3 abnormal lymph nodes: 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 Right breast benign-appearing calcifications previous stereotactic biopsy was attempted but it  was too superficial and felt to be benign.   Recommendation based on multidisciplinary tumor board: 1. Neoadjuvant chemotherapy with TCH Perjeta 6 cycles followed by Herceptin Perjeta maintenance for 1 year 2. 11/09/20: Right lumpectomy and left mastectomy [name]): Right breast: no evidence of malignancy Left breast: no residual carcinoma, with 19 left axillary lymph nodes negative for carcinoma. 3. Followed by adjuvant radiation therapy (due to inflammatory breast changes at diagnosis) ------------------------------------------------------------------------------------------------------------------------------------------ 10/16/2020: CT CAP: Decrease in cutaneous thickening.  Decrease in size of left axillary, subpectoral and supraclavicular lymph nodes, no distant metastatic disease.   Treatment plan: Herceptin Perjeta maintenance, currently receiving adjuvant radiation Herceptin Perjeta toxicities: Occasional diarrhea  Surveillance: She will need a mammogram of the right breast every year. We may consider doing CT scans in March 2023 to evaluate the subpectoral and supraclavicular lymph nodes.  Return to clinic every 3 weeks for Herceptin Perjeta and every 6 weeks with labs and follow-up with me.      No orders of the defined types were placed in this encounter.  The patient has a good  understanding of the overall plan. she agrees with it. she will call with any problems that may develop before the next visit here.  Total time spent: 30 mins including face to face time and time spent for planning, charting and coordination of care  Rulon Eisenmenger, MD, MPH 01/27/2021  I, Molly Dorshimer, am acting as scribe for Dr. Nicholas Lose.  I have reviewed the above documentation for accuracy and completeness, and I agree with the above.

## 2021-01-27 ENCOUNTER — Inpatient Hospital Stay: Payer: 59

## 2021-01-27 ENCOUNTER — Ambulatory Visit
Admission: RE | Admit: 2021-01-27 | Discharge: 2021-01-27 | Disposition: A | Discharge status: Home / Self Care | Payer: 59 | Source: Ambulatory Visit | Attending: Radiation Oncology | Admitting: Radiation Oncology

## 2021-01-27 ENCOUNTER — Other Ambulatory Visit: Payer: Self-pay

## 2021-01-27 ENCOUNTER — Inpatient Hospital Stay (HOSPITAL_BASED_OUTPATIENT_CLINIC_OR_DEPARTMENT_OTHER): Payer: 59 | Admitting: Hematology and Oncology

## 2021-01-27 ENCOUNTER — Ambulatory Visit: Payer: 59

## 2021-01-27 DIAGNOSIS — Z170 Estrogen receptor positive status [ER+]: Secondary | ICD-10-CM | POA: Insufficient documentation

## 2021-01-27 DIAGNOSIS — Z9013 Acquired absence of bilateral breasts and nipples: Secondary | ICD-10-CM | POA: Insufficient documentation

## 2021-01-27 DIAGNOSIS — C50212 Malignant neoplasm of upper-inner quadrant of left female breast: Secondary | ICD-10-CM

## 2021-01-27 DIAGNOSIS — Z79899 Other long term (current) drug therapy: Secondary | ICD-10-CM | POA: Insufficient documentation

## 2021-01-27 DIAGNOSIS — Z5112 Encounter for antineoplastic immunotherapy: Secondary | ICD-10-CM | POA: Insufficient documentation

## 2021-01-27 DIAGNOSIS — Z95828 Presence of other vascular implants and grafts: Secondary | ICD-10-CM

## 2021-01-27 DIAGNOSIS — M25612 Stiffness of left shoulder, not elsewhere classified: Secondary | ICD-10-CM | POA: Diagnosis not present

## 2021-01-27 LAB — CBC WITH DIFFERENTIAL (CANCER CENTER ONLY)
Abs Immature Granulocytes: 0.01 10*3/uL (ref 0.00–0.07)
Basophils Absolute: 0 10*3/uL (ref 0.0–0.1)
Basophils Relative: 0 %
Eosinophils Absolute: 0.1 10*3/uL (ref 0.0–0.5)
Eosinophils Relative: 1 %
HCT: 31.5 % — ABNORMAL LOW (ref 36.0–46.0)
Hemoglobin: 10.1 g/dL — ABNORMAL LOW (ref 12.0–15.0)
Immature Granulocytes: 0 %
Lymphocytes Relative: 26 %
Lymphs Abs: 1 10*3/uL (ref 0.7–4.0)
MCH: 27.5 pg (ref 26.0–34.0)
MCHC: 32.1 g/dL (ref 30.0–36.0)
MCV: 85.8 fL (ref 80.0–100.0)
Monocytes Absolute: 0.3 10*3/uL (ref 0.1–1.0)
Monocytes Relative: 8 %
Neutro Abs: 2.6 10*3/uL (ref 1.7–7.7)
Neutrophils Relative %: 65 %
Platelet Count: 221 10*3/uL (ref 150–400)
RBC: 3.67 MIL/uL — ABNORMAL LOW (ref 3.87–5.11)
RDW: 13.9 % (ref 11.5–15.5)
WBC Count: 4 10*3/uL (ref 4.0–10.5)
nRBC: 0 % (ref 0.0–0.2)

## 2021-01-27 LAB — CMP (CANCER CENTER ONLY)
ALT: 7 U/L (ref 0–44)
AST: 6 U/L — ABNORMAL LOW (ref 15–41)
Albumin: 3.6 g/dL (ref 3.5–5.0)
Alkaline Phosphatase: 49 U/L (ref 38–126)
Anion gap: 8 (ref 5–15)
BUN: 17 mg/dL (ref 6–20)
CO2: 23 mmol/L (ref 22–32)
Calcium: 9.6 mg/dL (ref 8.9–10.3)
Chloride: 110 mmol/L (ref 98–111)
Creatinine: 0.73 mg/dL (ref 0.44–1.00)
GFR, Estimated: >60 mL/min (ref >60)
Glucose, Bld: 203 mg/dL — ABNORMAL HIGH (ref 70–99)
Potassium: 3.9 mmol/L (ref 3.5–5.1)
Sodium: 141 mmol/L (ref 135–145)
Total Bilirubin: 0.4 mg/dL (ref 0.3–1.2)
Total Protein: 6.8 g/dL (ref 6.5–8.1)

## 2021-01-27 MED ORDER — SODIUM CHLORIDE 0.9 % IV SOLN
Freq: Once | INTRAVENOUS | Status: AC
  Filled 2021-01-27: qty 250

## 2021-01-27 MED ORDER — SODIUM CHLORIDE 0.9 % IV SOLN
420.0000 mg | Freq: Once | INTRAVENOUS | Status: AC
  Administered 2021-01-27: 420 mg via INTRAVENOUS
  Filled 2021-01-27: qty 14

## 2021-01-27 MED ORDER — SODIUM CHLORIDE 0.9% FLUSH
10.0000 mL | Freq: Once | INTRAVENOUS | Status: AC
  Administered 2021-01-27: 10 mL
  Filled 2021-01-27: qty 10

## 2021-01-27 MED ORDER — SODIUM CHLORIDE 0.9 % IV SOLN
6.0000 mg/kg | Freq: Once | INTRAVENOUS | Status: AC
  Administered 2021-01-27: 525 mg via INTRAVENOUS
  Filled 2021-01-27: qty 25

## 2021-01-27 MED ORDER — DIPHENHYDRAMINE HCL 25 MG PO CAPS
50.0000 mg | ORAL_CAPSULE | Freq: Once | ORAL | Status: AC
  Administered 2021-01-27: 50 mg via ORAL

## 2021-01-27 MED ORDER — DIPHENHYDRAMINE HCL 25 MG PO CAPS
ORAL_CAPSULE | ORAL | Status: AC
  Filled 2021-01-27: qty 2

## 2021-01-27 MED ORDER — ACETAMINOPHEN 325 MG PO TABS
650.0000 mg | ORAL_TABLET | Freq: Once | ORAL | Status: AC
  Administered 2021-01-27: 650 mg via ORAL

## 2021-01-27 MED ORDER — ACETAMINOPHEN 325 MG PO TABS
ORAL_TABLET | ORAL | Status: AC
  Filled 2021-01-27: qty 2

## 2021-01-27 MED ORDER — HEPARIN SOD (PORK) LOCK FLUSH 100 UNIT/ML IV SOLN
500.0000 [IU] | Freq: Once | INTRAVENOUS | Status: AC | PRN
  Administered 2021-01-27: 500 [IU]
  Filled 2021-01-27: qty 5

## 2021-01-27 MED ORDER — SODIUM CHLORIDE 0.9% FLUSH
10.0000 mL | INTRAVENOUS | Status: DC | PRN
Start: 2021-01-27 — End: 2021-01-27
  Administered 2021-01-27: 10 mL
  Filled 2021-01-27: qty 10

## 2021-01-27 NOTE — Patient Instructions (Signed)
[facility]   Discharge Instructions: Thank you for choosing [facility] to provide your oncology and hematology care.   If you have a lab appointment with the [facility], please go directly to the [facility] and check in at the registration area.   Wear comfortable clothing and clothing appropriate for easy access to any Portacath or PICC line.   We strive to give you quality time with your provider. You may need to reschedule your appointment if you arrive late (15 or more minutes).  Arriving late affects you and other patients whose appointments are after yours.  Also, if you miss three or more appointments without notifying the office, you may be dismissed from the clinic at the provider's discretion.      For prescription refill requests, have your pharmacy contact our office and allow 72 hours for refills to be completed.    Today you received the following chemotherapy and/or immunotherapy agents: trastuzumab and pertuzumab.      To help prevent nausea and vomiting after your treatment, we encourage you to take your nausea medication as directed.  BELOW ARE SYMPTOMS THAT SHOULD BE REPORTED IMMEDIATELY: *FEVER GREATER THAN 100.4 F (38 C) OR HIGHER *CHILLS OR SWEATING *NAUSEA AND VOMITING THAT IS NOT CONTROLLED WITH YOUR NAUSEA MEDICATION *UNUSUAL SHORTNESS OF BREATH *UNUSUAL BRUISING OR BLEEDING *URINARY PROBLEMS (pain or burning when urinating, or frequent urination) *BOWEL PROBLEMS (unusual diarrhea, constipation, pain near the anus) TENDERNESS IN MOUTH AND THROAT WITH OR WITHOUT PRESENCE OF ULCERS (sore throat, sores in mouth, or a toothache) UNUSUAL RASH, SWELLING OR PAIN  UNUSUAL VAGINAL DISCHARGE OR ITCHING   Items with * indicate a potential emergency and should be followed up as soon as possible or go to the Emergency Department if any problems should occur.  Please show the CHEMOTHERAPY ALERT CARD or IMMUNOTHERAPY ALERT  CARD at check-in to the Emergency Department and triage nurse.  Should you have questions after your visit or need to cancel or reschedule your appointment, please contact [employer]  Dept: [phone]  and follow the prompts.  Office hours are 8:00 a.m. to 4:30 p.m. Monday - Friday. Please note that voicemails left after 4:00 p.m. may not be returned until the following business day.  We are closed weekends and major holidays. You have access to a nurse at all times for urgent questions. Please call the main number to the clinic Dept: [phone] and follow the prompts.   For any non-urgent questions, you may also contact your provider using MyChart. We now offer e-Visits for anyone 45 and older to request care online for non-urgent symptoms. For details visit [email or web address].   Also download the MyChart app! Go to the app store, search "MyChart", open the app, select [facility], and log in with your MyChart username and password.  Due to Covid, a mask is required upon entering the hospital/clinic. If you do not have a mask, one will be given to you upon arrival. For doctor visits, patients may have 1 support person aged 45 or older with them. For treatment visits, patients cannot have anyone with them due to current Covid guidelines and our immunocompromised population.

## 2021-01-28 ENCOUNTER — Ambulatory Visit: Payer: 59

## 2021-01-28 ENCOUNTER — Ambulatory Visit
Admission: RE | Admit: 2021-01-28 | Discharge: 2021-01-28 | Disposition: A | Discharge status: Home / Self Care | Payer: 59 | Source: Ambulatory Visit | Attending: Radiation Oncology | Admitting: Radiation Oncology

## 2021-01-28 ENCOUNTER — Telehealth: Payer: Self-pay | Admitting: Hematology and Oncology

## 2021-01-28 ENCOUNTER — Other Ambulatory Visit: Payer: Self-pay

## 2021-01-28 DIAGNOSIS — M25612 Stiffness of left shoulder, not elsewhere classified: Secondary | ICD-10-CM | POA: Diagnosis not present

## 2021-01-28 NOTE — Telephone Encounter (Signed)
Scheduled per 6/14 los. Pt will receive an updated appt calendar per next visit appt notes

## 2021-01-29 ENCOUNTER — Other Ambulatory Visit: Payer: Self-pay

## 2021-01-29 ENCOUNTER — Ambulatory Visit
Admission: RE | Admit: 2021-01-29 | Discharge: 2021-01-29 | Disposition: A | Discharge status: Home / Self Care | Payer: 59 | Source: Ambulatory Visit | Attending: Radiation Oncology | Admitting: Radiation Oncology

## 2021-01-29 ENCOUNTER — Ambulatory Visit: Payer: 59

## 2021-01-29 DIAGNOSIS — M25612 Stiffness of left shoulder, not elsewhere classified: Secondary | ICD-10-CM | POA: Diagnosis not present

## 2021-02-01 ENCOUNTER — Ambulatory Visit: Payer: 59 | Admitting: Radiation Oncology

## 2021-02-01 ENCOUNTER — Ambulatory Visit
Admission: RE | Admit: 2021-02-01 | Discharge: 2021-02-01 | Disposition: A | Discharge status: Home / Self Care | Payer: 59 | Source: Ambulatory Visit | Attending: Radiation Oncology | Admitting: Radiation Oncology

## 2021-02-01 ENCOUNTER — Ambulatory Visit: Payer: 59

## 2021-02-01 ENCOUNTER — Other Ambulatory Visit: Payer: Self-pay

## 2021-02-01 DIAGNOSIS — M25612 Stiffness of left shoulder, not elsewhere classified: Secondary | ICD-10-CM | POA: Diagnosis not present

## 2021-02-02 ENCOUNTER — Ambulatory Visit: Payer: 59

## 2021-02-02 ENCOUNTER — Ambulatory Visit
Admission: RE | Admit: 2021-02-02 | Discharge: 2021-02-02 | Disposition: A | Discharge status: Home / Self Care | Payer: 59 | Source: Ambulatory Visit | Attending: Radiation Oncology | Admitting: Radiation Oncology

## 2021-02-02 DIAGNOSIS — M25612 Stiffness of left shoulder, not elsewhere classified: Secondary | ICD-10-CM | POA: Diagnosis not present

## 2021-02-03 ENCOUNTER — Ambulatory Visit: Payer: 59

## 2021-02-03 ENCOUNTER — Other Ambulatory Visit: Payer: Self-pay

## 2021-02-03 ENCOUNTER — Ambulatory Visit
Admission: RE | Admit: 2021-02-03 | Discharge: 2021-02-03 | Disposition: A | Discharge status: Home / Self Care | Payer: 59 | Source: Ambulatory Visit | Attending: Radiation Oncology | Admitting: Radiation Oncology

## 2021-02-03 DIAGNOSIS — M25612 Stiffness of left shoulder, not elsewhere classified: Secondary | ICD-10-CM | POA: Diagnosis not present

## 2021-02-04 ENCOUNTER — Ambulatory Visit
Admission: RE | Admit: 2021-02-04 | Discharge: 2021-02-04 | Disposition: A | Discharge status: Home / Self Care | Payer: 59 | Source: Ambulatory Visit | Attending: Radiation Oncology | Admitting: Radiation Oncology

## 2021-02-04 ENCOUNTER — Ambulatory Visit: Payer: 59

## 2021-02-04 DIAGNOSIS — Z170 Estrogen receptor positive status [ER+]: Secondary | ICD-10-CM

## 2021-02-04 DIAGNOSIS — M25612 Stiffness of left shoulder, not elsewhere classified: Secondary | ICD-10-CM

## 2021-02-04 DIAGNOSIS — R293 Abnormal posture: Secondary | ICD-10-CM

## 2021-02-04 DIAGNOSIS — C50212 Malignant neoplasm of upper-inner quadrant of left female breast: Secondary | ICD-10-CM

## 2021-02-04 NOTE — Therapy (Signed)
[place], [place], [address] Phone: [phone]   Fax:  [phone]  Physical Therapy Treatment  Patient Details  Name: Robin Kennedy MRN: [id number] Date of Birth: Mar 06, 1976 Referring Provider (PT): Dr. [name]   Encounter Date: 02/04/2021   PT End of Session - 02/04/21 0908     Visit Number 15    Number of Visits 22    Date for PT Re-Evaluation 02/16/21    PT Start Time 0802    PT Stop Time 0904    PT Time Calculation (min) 62 min    Activity Tolerance Patient tolerated treatment well    Behavior During Therapy [facility] for tasks assessed/performed             Past Medical History:  Diagnosis Date   Diabetes mellitus without complication ([place])    Personal history of chemotherapy    SVD (spontaneous vaginal delivery)    X 3    Past Surgical History:  Procedure Laterality Date   BREAST BIOPSY Left 06/02/2020   x2   BREAST BIOPSY Right 06/22/2020   CYSTOSCOPY N/A 12/20/2016   Procedure: CYSTOSCOPY;  Surgeon: Lavonia Drafts, MD;  Location: [facility];  Service: Gynecology;  Laterality: N/A;   MODIFIED MASTECTOMY Left 11/09/2020   Procedure: LEFT MODIFIED MASTECTOMY WITH LYMPH NODE DISSECTION;  Surgeon: [name], MD;  Location: [facility];  Service: General;  Laterality: Left;   PORTACATH PLACEMENT Left 06/23/2020   Procedure: INSERTION PORT-A-CATH WITH ULTRASOUND GUIDANCE;  Surgeon: [name], MD;  Location: [facility];  Service: General;  Laterality: Left;   RADIOACTIVE SEED GUIDED EXCISIONAL BREAST BIOPSY Right 11/09/2020   Procedure: RIGHT BREAST SEED LOCALIZED EXCISIONAL BIOPSY;  Surgeon: [name], MD;  Location: [facility];  Service: General;  Laterality: Right;   ROBOTIC ASSISTED TOTAL HYSTERECTOMY WITH BILATERAL SALPINGO OOPHERECTOMY Bilateral 12/20/2016   Procedure: ROBOTIC ASSISTED TOTAL HYSTERECTOMY WITH BILATERAL  SALPINGO OOPHORECTOMY;  Surgeon: Lavonia Drafts, MD;  Location: [facility];  Service: Gynecology;  Laterality: Bilateral;   TUBAL LIGATION     WISDOM TOOTH EXTRACTION      There were no vitals filed for this visit.   Subjective Assessment - 02/04/21 0810     Subjective I'm doing really well. 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 This involves alot of repeptitive motions so I want to make sure my shoulder does okay with that.    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 It is ER positive, PR negative, and HER2 positive with a Ki67 of 60%. She underwent neoadjuvant chemotherapy 06/25/2020-10/07/2020 followed by a right lumpectomy and a left mastectomy with an axillary lymph node dissection (19 negative nodes removed) on 11/09/2020. She has type II diabetes.    Patient Stated Goals Get my arm moving again    Currently in Pain? No/denies                               [facility] Adult PT Treatment/Exercise - 02/04/21 0001       Exercises   Other Exercises  Instructed pt in Strength ABC program having her return demo of all except did not use Lt UE with stretches today due  to increased skin fraglilty      Manual Therapy   Myofascial Release --    Passive ROM briefly to L shoulder in to flexion and abduction- did not cont this today as pt has started to have some skin slough off near axilla                         PT Long Term Goals - 01/05/21 1141       PT LONG TERM GOAL #1   Title Patient will demonstrate she has regained full shoulder ROM and function post op compared to baseline assessments.    Status On-going      PT LONG TERM GOAL #2   Title Patient will increase left shoulder flexion AROM to >/= 160 degrees for increased ease reaching  overhead.    Baseline 42 post op / 151 pre-op; 154 degrees - 01/05/21    Time 6    Period Weeks    Status Revised      PT LONG TERM GOAL #3   Title Patient will increase left shoulder AROM abduction to >/= 160 degrees for increased ability to obtain radiation positioning.    Baseline 64 degrees post op / 167 pre-op; 150 degrees - 01/05/21    Time 6    Period Weeks    Status Revised      PT LONG TERM GOAL #4   Title Patient will improve her DASH score to </= 10 for improved overall UE function.    Baseline 4.55 pre-op; 75 post op; 11.83 - 01/05/21    Time 6    Period Weeks    Status Revised      PT LONG TERM GOAL #5   Title Patient will verbalize understnading of lymphedema risk reduction practices.    Baseline Pt has met this goal as she attended the ABC class and questions were answered for this at the following session later that same afternoon - 01/05/21                   Plan - 02/04/21 0908     Clinical Impression Statement Began session with brief Lt shoulder P/ROM but stopped ue ot increased skin fragility and layer of skin has sloughed off inferior to axilla which pt was not yet aware of so must have recently happened her. Cautioned her to be careful with stretching and limit this for now until after radiation ends and skin heals. She verbalized understanding and will be mindful to not create pull on skin.  Instructed her on Strength ABC Program today but limiting or not using Lt UE to decrease friction on skin prn with exs. She has one more visit and will benefit from review of technique with these exercises.    Examination-Activity Limitations --    Stability/Clinical Decision Making Stable/Uncomplicated    Rehab Potential Excellent    PT Frequency 2x / week    PT Duration 6 weeks    PT Treatment/Interventions ADLs/Self Care Home Management;Therapeutic exercise;Patient/family education;Manual techniques;Manual lymph drainage;Passive range of motion;Scar mobilization     PT Next Visit Plan Review Strength ABC and D/C if ready.    PT Home Exercise Plan Post op shoulder ROM HEP, supine wand flex and scaption, doorway stretches for end flexion and abduction; supine sacpular series with yellow theraband; Strength ABC    Consulted and Agree with Plan of Care Patient             Patient  will benefit from skilled therapeutic intervention in order to improve the following deficits and impairments:  Postural dysfunction, Decreased knowledge of precautions, Impaired UE functional use, Pain, Decreased range of motion, Decreased scar mobility  Visit Diagnosis: Stiffness of left shoulder, not elsewhere classified  Abnormal posture  Malignant neoplasm of upper-inner quadrant of left breast in female, estrogen receptor positive [facility])     Problem List Patient Active Problem List   Diagnosis Date Noted   Breast cancer metastasized to axillary lymph node, left ([place]) 11/09/2020   Port-A-Cath in place 07/02/2020   Genetic testing 06/29/2020   Malignant neoplasm of upper-inner quadrant of left breast in female, estrogen receptor positive ([place]) 06/05/2020   Post-operative state 12/20/2016   Fibroids 12/06/2016   Pelvic pain in female 12/06/2016   Diabetes mellitus ([place]) 06/19/2012    Otelia Limes, PTA 02/04/2021, 10:07 AM  [place], [place], [address] Phone: [phone]   Fax:  [phone]  Name: Delilah Mulgrew MRN: [id number] Date of Birth: 26-Mar-1976

## 2021-02-05 ENCOUNTER — Ambulatory Visit: Payer: 59

## 2021-02-05 ENCOUNTER — Other Ambulatory Visit: Payer: Self-pay

## 2021-02-05 ENCOUNTER — Encounter: Payer: Self-pay | Admitting: *Deleted

## 2021-02-05 ENCOUNTER — Ambulatory Visit
Admission: RE | Admit: 2021-02-05 | Discharge: 2021-02-05 | Disposition: A | Discharge status: Home / Self Care | Payer: 59 | Source: Ambulatory Visit | Attending: Radiation Oncology | Admitting: Radiation Oncology

## 2021-02-05 DIAGNOSIS — M25612 Stiffness of left shoulder, not elsewhere classified: Secondary | ICD-10-CM | POA: Diagnosis not present

## 2021-02-08 ENCOUNTER — Ambulatory Visit: Payer: 59

## 2021-02-08 ENCOUNTER — Other Ambulatory Visit: Payer: Self-pay

## 2021-02-08 ENCOUNTER — Ambulatory Visit
Admission: RE | Admit: 2021-02-08 | Discharge: 2021-02-08 | Disposition: A | Discharge status: Home / Self Care | Payer: 59 | Source: Ambulatory Visit | Attending: Radiation Oncology | Admitting: Radiation Oncology

## 2021-02-08 DIAGNOSIS — M25612 Stiffness of left shoulder, not elsewhere classified: Secondary | ICD-10-CM | POA: Diagnosis not present

## 2021-02-09 ENCOUNTER — Encounter: Payer: Self-pay | Admitting: Radiation Oncology

## 2021-02-09 ENCOUNTER — Encounter: Payer: Self-pay | Admitting: Physical Therapy

## 2021-02-09 ENCOUNTER — Ambulatory Visit
Admission: RE | Admit: 2021-02-09 | Discharge: 2021-02-09 | Disposition: A | Discharge status: Home / Self Care | Payer: 59 | Source: Ambulatory Visit | Attending: Radiation Oncology | Admitting: Radiation Oncology

## 2021-02-09 DIAGNOSIS — M25612 Stiffness of left shoulder, not elsewhere classified: Secondary | ICD-10-CM | POA: Diagnosis not present

## 2021-02-17 ENCOUNTER — Other Ambulatory Visit: Payer: Self-pay

## 2021-02-17 ENCOUNTER — Inpatient Hospital Stay: Payer: 59 | Attending: Hematology and Oncology

## 2021-02-17 VITALS — BP 116/79 | HR 79 | Temp 98.1°F | Resp 16

## 2021-02-17 DIAGNOSIS — Z923 Personal history of irradiation: Secondary | ICD-10-CM | POA: Insufficient documentation

## 2021-02-17 DIAGNOSIS — Z90722 Acquired absence of ovaries, bilateral: Secondary | ICD-10-CM | POA: Diagnosis not present

## 2021-02-17 DIAGNOSIS — Z9012 Acquired absence of left breast and nipple: Secondary | ICD-10-CM | POA: Diagnosis not present

## 2021-02-17 DIAGNOSIS — Z9221 Personal history of antineoplastic chemotherapy: Secondary | ICD-10-CM | POA: Insufficient documentation

## 2021-02-17 DIAGNOSIS — Z170 Estrogen receptor positive status [ER+]: Secondary | ICD-10-CM | POA: Insufficient documentation

## 2021-02-17 DIAGNOSIS — Z5112 Encounter for antineoplastic immunotherapy: Secondary | ICD-10-CM | POA: Insufficient documentation

## 2021-02-17 DIAGNOSIS — C50212 Malignant neoplasm of upper-inner quadrant of left female breast: Secondary | ICD-10-CM

## 2021-02-17 DIAGNOSIS — Z79899 Other long term (current) drug therapy: Secondary | ICD-10-CM | POA: Diagnosis not present

## 2021-02-17 MED ORDER — SODIUM CHLORIDE 0.9 % IV SOLN
420.0000 mg | Freq: Once | INTRAVENOUS | Status: AC
  Administered 2021-02-17: 420 mg via INTRAVENOUS
  Filled 2021-02-17: qty 14

## 2021-02-17 MED ORDER — DIPHENHYDRAMINE HCL 25 MG PO CAPS
50.0000 mg | ORAL_CAPSULE | Freq: Once | ORAL | Status: AC
  Administered 2021-02-17: 50 mg via ORAL

## 2021-02-17 MED ORDER — DIPHENHYDRAMINE HCL 25 MG PO CAPS
ORAL_CAPSULE | ORAL | Status: AC
  Filled 2021-02-17: qty 1

## 2021-02-17 MED ORDER — SODIUM CHLORIDE 0.9 % IV SOLN
Freq: Once | INTRAVENOUS | Status: AC
  Filled 2021-02-17: qty 250

## 2021-02-17 MED ORDER — ACETAMINOPHEN 325 MG PO TABS
650.0000 mg | ORAL_TABLET | Freq: Once | ORAL | Status: AC
Start: 2021-02-17 — End: 2021-02-17
  Administered 2021-02-17: 650 mg via ORAL

## 2021-02-17 MED ORDER — ACETAMINOPHEN 325 MG PO TABS
ORAL_TABLET | ORAL | Status: AC
  Filled 2021-02-17: qty 2

## 2021-02-17 MED ORDER — HEPARIN SOD (PORK) LOCK FLUSH 100 UNIT/ML IV SOLN
500.0000 [IU] | Freq: Once | INTRAVENOUS | Status: AC | PRN
  Administered 2021-02-17: 500 [IU]
  Filled 2021-02-17: qty 5

## 2021-02-17 MED ORDER — SODIUM CHLORIDE 0.9% FLUSH
10.0000 mL | INTRAVENOUS | Status: DC | PRN
  Administered 2021-02-17: 10 mL
  Filled 2021-02-17: qty 10

## 2021-02-17 MED ORDER — TRASTUZUMAB-DKST CHEMO 150 MG IV SOLR
6.0000 mg/kg | Freq: Once | INTRAVENOUS | Status: AC
  Administered 2021-02-17: 525 mg via INTRAVENOUS
  Filled 2021-02-17: qty 25

## 2021-02-17 NOTE — Patient Instructions (Signed)
[facility]   Discharge Instructions: Thank you for choosing [facility] to provide your oncology and hematology care.   If you have a lab appointment with the [facility], please go directly to the [facility] and check in at the registration area.   Wear comfortable clothing and clothing appropriate for easy access to any Portacath or PICC line.   We strive to give you quality time with your provider. You may need to reschedule your appointment if you arrive late (15 or more minutes).  Arriving late affects you and other patients whose appointments are after yours.  Also, if you miss three or more appointments without notifying the office, you may be dismissed from the clinic at the provider's discretion.      For prescription refill requests, have your pharmacy contact our office and allow 72 hours for refills to be completed.    Today you received the following chemotherapy and/or immunotherapy agents: trastuzumab and pertuzumab.      To help prevent nausea and vomiting after your treatment, we encourage you to take your nausea medication as directed.  BELOW ARE SYMPTOMS THAT SHOULD BE REPORTED IMMEDIATELY: *FEVER GREATER THAN 100.4 F (38 C) OR HIGHER *CHILLS OR SWEATING *NAUSEA AND VOMITING THAT IS NOT CONTROLLED WITH YOUR NAUSEA MEDICATION *UNUSUAL SHORTNESS OF BREATH *UNUSUAL BRUISING OR BLEEDING *URINARY PROBLEMS (pain or burning when urinating, or frequent urination) *BOWEL PROBLEMS (unusual diarrhea, constipation, pain near the anus) TENDERNESS IN MOUTH AND THROAT WITH OR WITHOUT PRESENCE OF ULCERS (sore throat, sores in mouth, or a toothache) UNUSUAL RASH, SWELLING OR PAIN  UNUSUAL VAGINAL DISCHARGE OR ITCHING   Items with * indicate a potential emergency and should be followed up as soon as possible or go to the Emergency Department if any problems should occur.  Please show the CHEMOTHERAPY ALERT CARD or IMMUNOTHERAPY ALERT  CARD at check-in to the Emergency Department and triage nurse.  Should you have questions after your visit or need to cancel or reschedule your appointment, please contact [facility]  Dept: [phone]  and follow the prompts.  Office hours are 8:00 a.m. to 4:30 p.m. Monday - Friday. Please note that voicemails left after 4:00 p.m. may not be returned until the following business day.  We are closed weekends and major holidays. You have access to a nurse at all times for urgent questions. Please call the main number to the clinic Dept: [phone] and follow the prompts.   For any non-urgent questions, you may also contact your provider using MyChart. We now offer e-Visits for anyone 44 and older to request care online for non-urgent symptoms. For details visit [email or web address].   Also download the MyChart app! Go to the app store, search "MyChart", open the app, select [facility], and log in with your MyChart username and password.  Due to Covid, a mask is required upon entering the hospital/clinic. If you do not have a mask, one will be given to you upon arrival. For doctor visits, patients may have 1 support person aged 78 or older with them. For treatment visits, patients cannot have anyone with them due to current Covid guidelines and our immunocompromised population.

## 2021-03-08 ENCOUNTER — Ambulatory Visit: Payer: 59 | Attending: General Surgery | Admitting: Rehabilitation

## 2021-03-08 ENCOUNTER — Other Ambulatory Visit: Payer: Self-pay

## 2021-03-08 ENCOUNTER — Encounter: Payer: Self-pay | Admitting: Rehabilitation

## 2021-03-08 DIAGNOSIS — Z170 Estrogen receptor positive status [ER+]: Secondary | ICD-10-CM | POA: Insufficient documentation

## 2021-03-08 DIAGNOSIS — M25612 Stiffness of left shoulder, not elsewhere classified: Secondary | ICD-10-CM | POA: Insufficient documentation

## 2021-03-08 DIAGNOSIS — C50212 Malignant neoplasm of upper-inner quadrant of left female breast: Secondary | ICD-10-CM

## 2021-03-08 DIAGNOSIS — R293 Abnormal posture: Secondary | ICD-10-CM | POA: Insufficient documentation

## 2021-03-08 NOTE — Therapy (Signed)
[place], [place], [address] Phone: [phone]   Fax:  [phone]  Physical Therapy Treatment  Patient Details  Name: Robin Kennedy MRN: [id number] Date of Birth: Aug 09, 1976 Referring Provider (PT): Dr. [name]   Encounter Date: 03/08/2021   PT End of Session - 03/08/21 1432     Visit Number 16    Number of Visits 22    Date for PT Re-Evaluation 02/16/21    PT Start Time 1400    PT Stop Time 1432    PT Time Calculation (min) 32 min    Activity Tolerance Patient tolerated treatment well    Behavior During Therapy [facility] for tasks assessed/performed             Past Medical History:  Diagnosis Date   Diabetes mellitus without complication ([place])    Personal history of chemotherapy    SVD (spontaneous vaginal delivery)    X 3    Past Surgical History:  Procedure Laterality Date   BREAST BIOPSY Left 06/02/2020   x2   BREAST BIOPSY Right 06/22/2020   CYSTOSCOPY N/A 12/20/2016   Procedure: CYSTOSCOPY;  Surgeon: Lavonia Drafts, MD;  Location: [facility];  Service: Gynecology;  Laterality: N/A;   MODIFIED MASTECTOMY Left 11/09/2020   Procedure: LEFT MODIFIED MASTECTOMY WITH LYMPH NODE DISSECTION;  Surgeon: [name], MD;  Location: [facility];  Service: General;  Laterality: Left;   PORTACATH PLACEMENT Left 06/23/2020   Procedure: INSERTION PORT-A-CATH WITH ULTRASOUND GUIDANCE;  Surgeon: [name], MD;  Location: [facility];  Service: General;  Laterality: Left;   RADIOACTIVE SEED GUIDED EXCISIONAL BREAST BIOPSY Right 11/09/2020   Procedure: RIGHT BREAST SEED LOCALIZED EXCISIONAL BIOPSY;  Surgeon: [name], MD;  Location: [facility];  Service: General;  Laterality: Right;   ROBOTIC ASSISTED TOTAL HYSTERECTOMY WITH BILATERAL SALPINGO OOPHERECTOMY Bilateral 12/20/2016   Procedure: ROBOTIC ASSISTED TOTAL HYSTERECTOMY WITH BILATERAL  SALPINGO OOPHORECTOMY;  Surgeon: Lavonia Drafts, MD;  Location: [facility];  Service: Gynecology;  Laterality: Bilateral;   TUBAL LIGATION     WISDOM TOOTH EXTRACTION      There were no vitals filed for this visit.   Subjective Assessment - 03/08/21 1404     Subjective someone told me I needed to come back and make sure I was ok.    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 It is ER positive, PR negative, and HER2 positive with a Ki67 of 60%. She underwent neoadjuvant chemotherapy 06/25/2020-10/07/2020 followed by a right lumpectomy and a left mastectomy with an axillary lymph node dissection (19 negative nodes removed) on 11/09/2020. She has type II diabetes.    Currently in Pain? No/denies                [facility] PT Assessment - 03/08/21 0001       AROM   Left Shoulder Flexion 164 Degrees    Left Shoulder ABduction 168 Degrees                   Quick Dash - 03/08/21 0001     Open a tight or new jar No difficulty    Do heavy household chores (wash walls, wash floors) No difficulty    Carry a shopping bag or briefcase No difficulty    Wash your back No difficulty    Use a knife to cut food No difficulty    Recreational activities  in which you take some force or impact through your arm, shoulder, or hand (golf, hammering, tennis) No difficulty    During the past week, to what extent has your arm, shoulder or hand problem interfered with your normal social activities with family, friends, neighbors, or groups? Not at all    During the past week, to what extent has your arm, shoulder or hand problem limited your work or other regular daily activities Slightly    Arm, shoulder, or hand pain. None    Tingling (pins and needles) in your arm, shoulder, or hand None    Difficulty Sleeping No difficulty    DASH Score 2.27 %                    [facility] Adult PT Treatment/Exercise - 03/08/21  0001       Self-Care   Self-Care Other Self-Care Comments    Other Self-Care Comments  checked pts skin which has healed well mild edematous "square" on the lateral trunk so pt was instructed in MLD towards the groin and was given a foam rectangle for her bra and instruction on returning if this remains.  Discussed using sleeve and glove for exercise as she has noted some heaviness after working out but continues with WNL SOZO screening                         PT Long Term Goals - 03/08/21 1413       PT LONG TERM GOAL #1   Title Patient will demonstrate she has regained full shoulder ROM and function post op compared to baseline assessments.    Status Achieved      PT LONG TERM GOAL #2   Title Patient will increase left shoulder flexion AROM to >/= 160 degrees for increased ease reaching overhead.    Status Achieved      PT LONG TERM GOAL #3   Title Patient will increase left shoulder AROM abduction to >/= 160 degrees for increased ability to obtain radiation positioning.    Status Achieved      PT LONG TERM GOAL #4   Title Patient will improve her DASH score to </= 10 for improved overall UE function.    Baseline 4.55 pre-op; 75 post op; 11.83 - 01/05/21, 10% on 03/08/21    Status Achieved      PT LONG TERM GOAL #5   Title Patient will verbalize understnading of lymphedema risk reduction practices.    Baseline Pt has met this goal as she attended the ABC class and questions were answered for this at the following session later that same afternoon - 01/05/21    Status Achieved                   Plan - 03/08/21 1432     Clinical Impression Statement All goals met.  Ready for DC.  Excellent ROM improved since last time. checked pts skin which has healed well mild edematous "square" on the lateral trunk so pt was instructed in MLD towards the groin and was given a foam rectangle for her bra and instruction on returning if this remains.  Discussed using sleeve  and glove for exercise as she has noted some heaviness after working out but continues with WNL SOZO screening    PT Next Visit Plan continue SOZO screens    Consulted and Agree with Plan of Care Patient  Patient will benefit from skilled therapeutic intervention in order to improve the following deficits and impairments:     Visit Diagnosis: Stiffness of left shoulder, not elsewhere classified  Abnormal posture  Malignant neoplasm of upper-inner quadrant of left breast in female, estrogen receptor positive [facility])     Problem List Patient Active Problem List   Diagnosis Date Noted   Breast cancer metastasized to axillary lymph node, left ([place]) 11/09/2020   Port-A-Cath in place 07/02/2020   Genetic testing 06/29/2020   Malignant neoplasm of upper-inner quadrant of left breast in female, estrogen receptor positive ([place]) 06/05/2020   Post-operative state 12/20/2016   Fibroids 12/06/2016   Pelvic pain in female 12/06/2016   Diabetes mellitus ([place]) 06/19/2012    Stark Bray 03/08/2021, 2:34 PM  [place], [place], [address] Phone: [phone]   Fax:  [phone]  Name: Robin Kennedy MRN: [id number] Date of Birth: [birth year]

## 2021-03-09 IMAGING — MG DIGITAL DIAGNOSTIC BILAT W/ TOMO W/ CAD
6 of 10 series · 6 of 26 positions shown · non-contrast
Comparison: Previous exam(s).

CLINICAL DATA: 44-year-old female presenting for annual exam.
History of inflammatory left breast cancer diagnosed in [REDACTED]
presents for follow-up of probably benign right breast
calcifications. Biopsy of the calcifications was previously
attempted.

EXAM:
DIGITAL DIAGNOSTIC BILATERAL MAMMOGRAM WITH TOMOSYNTHESIS AND CAD
TECHNIQUE: Bilateral digital diagnostic mammography and breast tomosynthesis
was performed. The images were evaluated with computer-aided
detection.

[R ML]
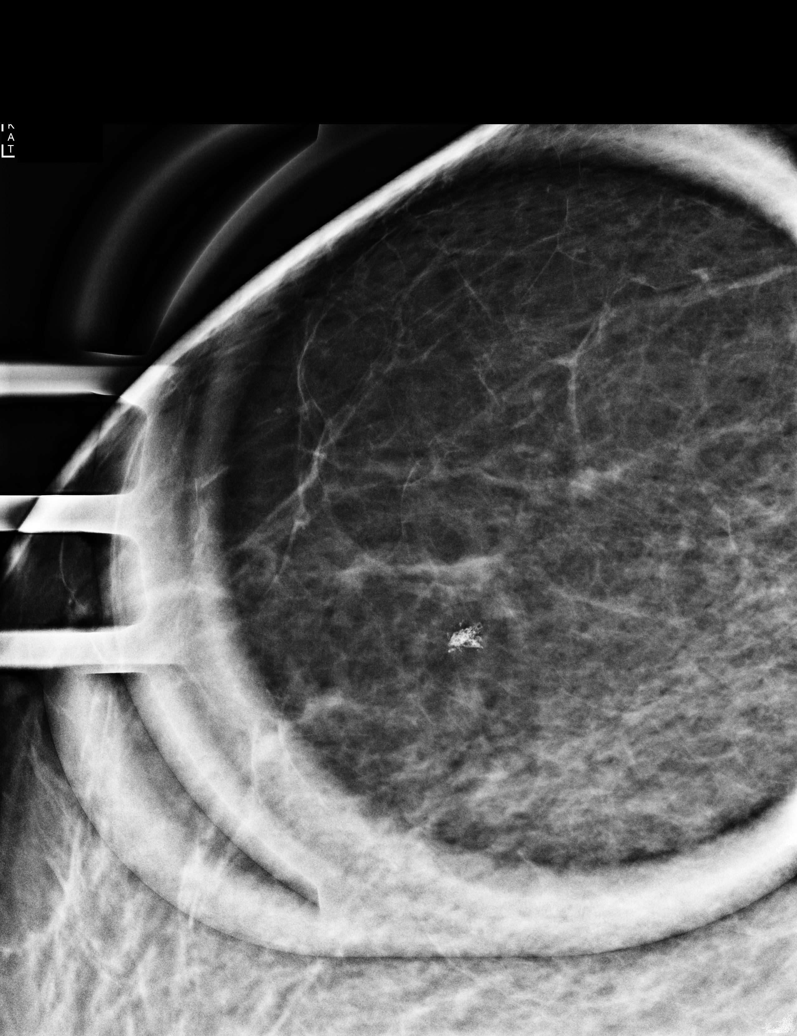

[R CC]
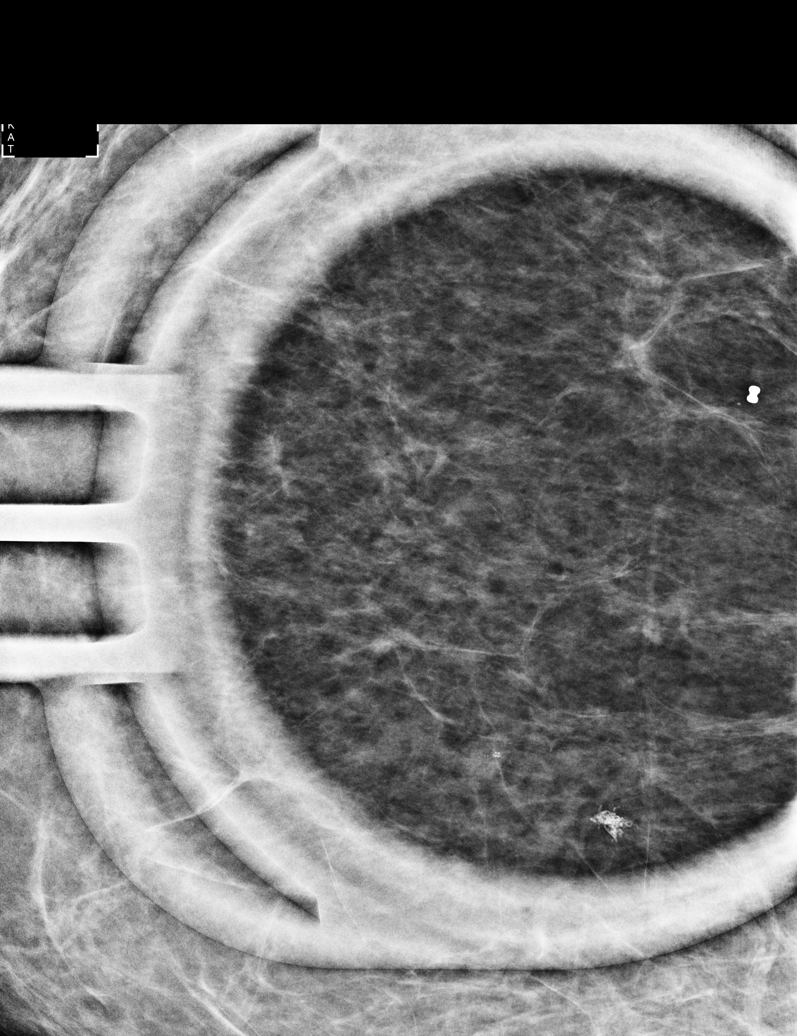

[L CC synth-2D]
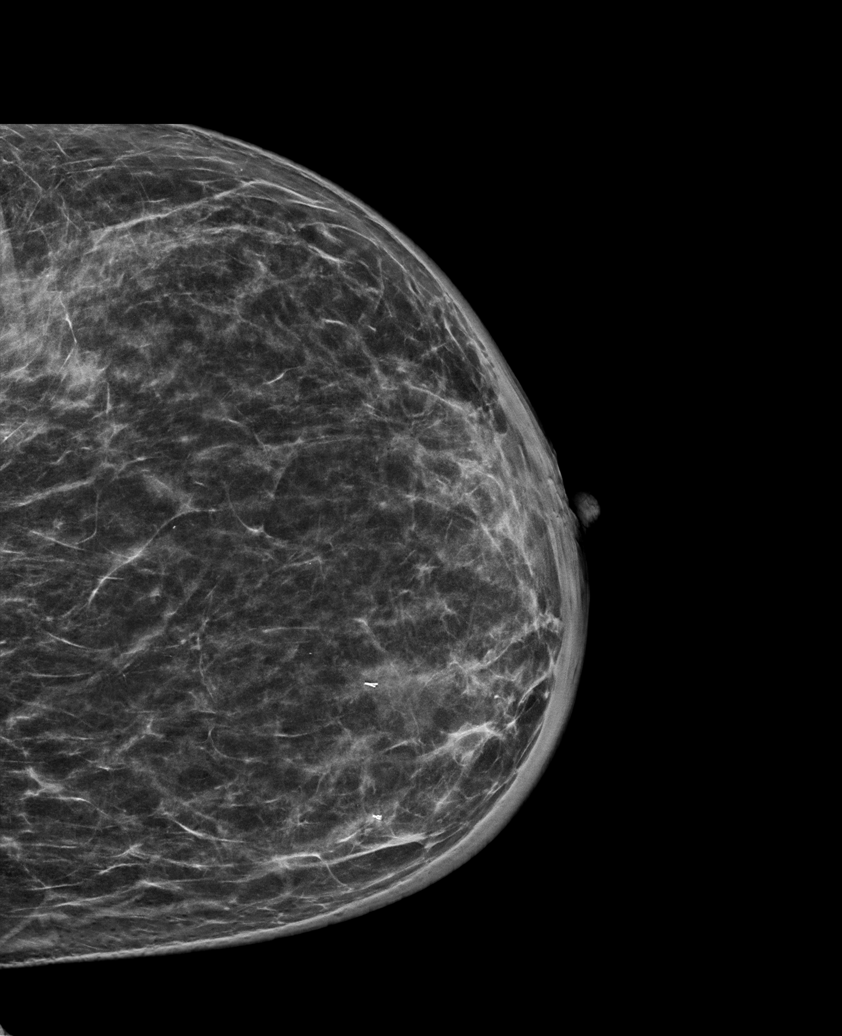

[R CC synth-2D]
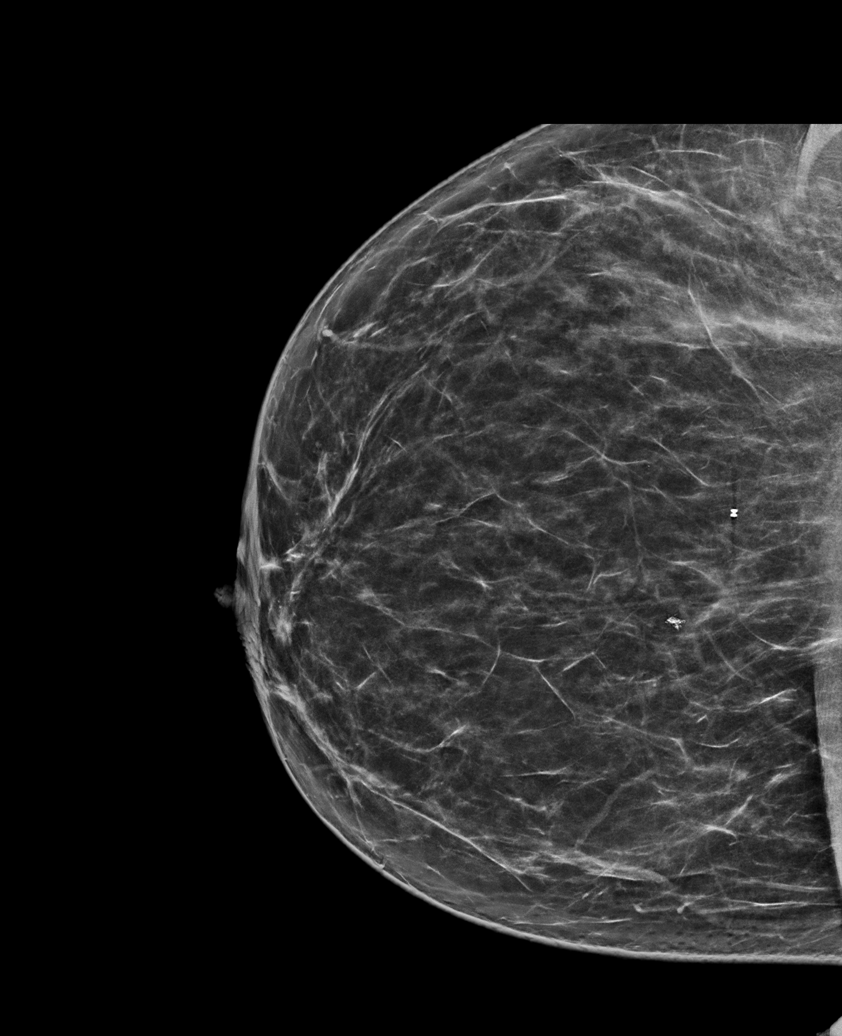

[R MLO synth-2D]
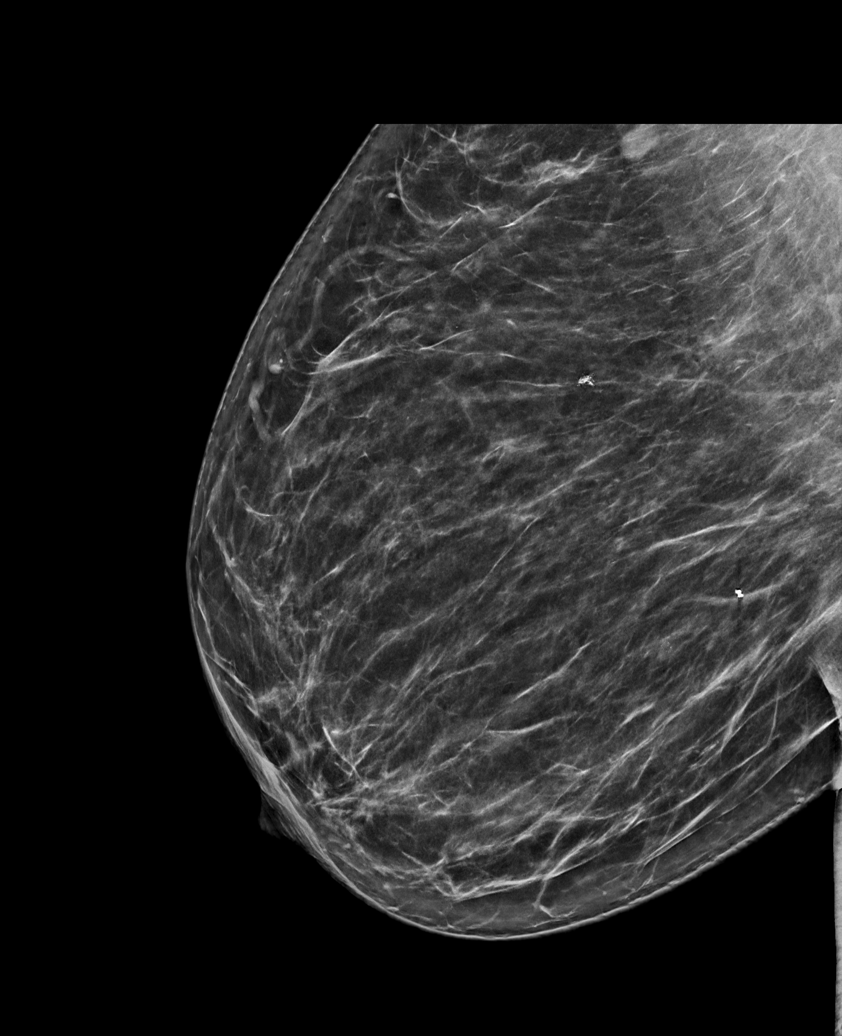

[L MLO synth-2D]
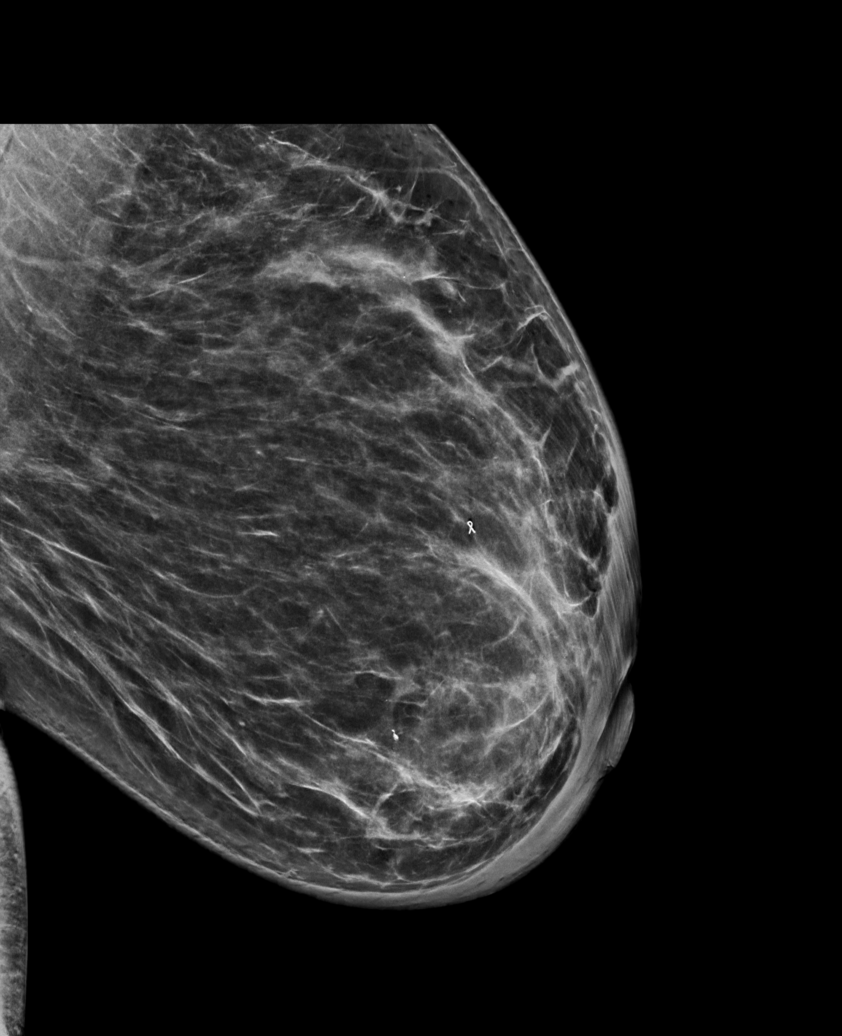

[6 of 26 positions shown; findings below may reference images not displayed]

ACR Breast Density Category b: There are scattered areas of
fibroglandular density.
FINDINGS: Mammogram:

Right breast:

Full field and spot 2D magnification views of the right breast
performed for probably benign calcifications in the upper central
right breast. There has been slight interval coarsening of the small
group of calcifications measuring 0.5 cm. No additional new findings
elsewhere in the right breast.

Left breast:

There has been interval decrease in the previously seen diffuse left
breast thickening and diffuse trabeculation. There has been interval
significant decrease in size of the previously seen bilobed mass in
the upper inner left breast anterior depth. There is a residual
ill-defined asymmetry/mass at the ribbon biopsy marking clip
measuring approximately 1.2 cm.

There are no new suspicious findings in the left breast.

RECOMMENDATION:
1. Slight interval coarsening of a 5 mm group of pleomorphic
calcifications in the upper central right breast. Biopsy of these
was previously attempted but they were in accessible due to
location. These remain somewhat suspicious. Recommend localization
of the calcifications with excision at the time of the patient's
left breast surgery.

2. Interval significant decrease in the previously seen diffuse left
breast skin thickening and trabeculation. There is also been
significant interval decrease in the biopsy proven malignant mass in
the upper inner left breast. There is a residual small
asymmetry/mass measuring approximately 1.2 cm at the site of the
ribbon biopsy marking clip.

I have discussed the findings and recommendations with the patient.
If applicable, a reminder letter will be sent to the patient
regarding the next appointment.

BI-RADS CATEGORY  6: Known biopsy-proven malignancy.

## 2021-03-10 ENCOUNTER — Ambulatory Visit: Payer: 59 | Admitting: Hematology and Oncology

## 2021-03-10 ENCOUNTER — Other Ambulatory Visit: Payer: 59

## 2021-03-10 ENCOUNTER — Ambulatory Visit: Payer: 59

## 2021-03-11 ENCOUNTER — Inpatient Hospital Stay: Payer: 59

## 2021-03-11 ENCOUNTER — Encounter: Payer: Self-pay | Admitting: Adult Health

## 2021-03-11 ENCOUNTER — Other Ambulatory Visit: Payer: Self-pay

## 2021-03-11 ENCOUNTER — Inpatient Hospital Stay (HOSPITAL_BASED_OUTPATIENT_CLINIC_OR_DEPARTMENT_OTHER): Payer: 59 | Admitting: Adult Health

## 2021-03-11 VITALS — BP 98/60 | HR 100 | Temp 97.3°F | Resp 18 | Ht 68.0 in | Wt 179.4 lb

## 2021-03-11 DIAGNOSIS — C50212 Malignant neoplasm of upper-inner quadrant of left female breast: Secondary | ICD-10-CM | POA: Diagnosis not present

## 2021-03-11 DIAGNOSIS — Z170 Estrogen receptor positive status [ER+]: Secondary | ICD-10-CM | POA: Diagnosis not present

## 2021-03-11 DIAGNOSIS — Z95828 Presence of other vascular implants and grafts: Secondary | ICD-10-CM

## 2021-03-11 DIAGNOSIS — Z5112 Encounter for antineoplastic immunotherapy: Secondary | ICD-10-CM | POA: Diagnosis not present

## 2021-03-11 LAB — CMP (CANCER CENTER ONLY)
ALT: 7 U/L (ref 0–44)
AST: 7 U/L — ABNORMAL LOW (ref 15–41)
Albumin: 3.6 g/dL (ref 3.5–5.0)
Alkaline Phosphatase: 64 U/L (ref 38–126)
Anion gap: 9 (ref 5–15)
BUN: 15 mg/dL (ref 6–20)
CO2: 26 mmol/L (ref 22–32)
Calcium: 10 mg/dL (ref 8.9–10.3)
Chloride: 106 mmol/L (ref 98–111)
Creatinine: 0.93 mg/dL (ref 0.44–1.00)
GFR, Estimated: >60 mL/min (ref >60)
Glucose, Bld: 194 mg/dL — ABNORMAL HIGH (ref 70–99)
Potassium: 4 mmol/L (ref 3.5–5.1)
Sodium: 141 mmol/L (ref 135–145)
Total Bilirubin: 0.6 mg/dL (ref 0.3–1.2)
Total Protein: 7 g/dL (ref 6.5–8.1)

## 2021-03-11 LAB — CBC WITH DIFFERENTIAL (CANCER CENTER ONLY)
Abs Immature Granulocytes: 0.01 10*3/uL (ref 0.00–0.07)
Basophils Absolute: 0 10*3/uL (ref 0.0–0.1)
Basophils Relative: 0 %
Eosinophils Absolute: 0 10*3/uL (ref 0.0–0.5)
Eosinophils Relative: 1 %
HCT: 30.3 % — ABNORMAL LOW (ref 36.0–46.0)
Hemoglobin: 9.8 g/dL — ABNORMAL LOW (ref 12.0–15.0)
Immature Granulocytes: 0 %
Lymphocytes Relative: 24 %
Lymphs Abs: 1.1 10*3/uL (ref 0.7–4.0)
MCH: 27 pg (ref 26.0–34.0)
MCHC: 32.3 g/dL (ref 30.0–36.0)
MCV: 83.5 fL (ref 80.0–100.0)
Monocytes Absolute: 0.3 10*3/uL (ref 0.1–1.0)
Monocytes Relative: 6 %
Neutro Abs: 3.2 10*3/uL (ref 1.7–7.7)
Neutrophils Relative %: 69 %
Platelet Count: 239 10*3/uL (ref 150–400)
RBC: 3.63 MIL/uL — ABNORMAL LOW (ref 3.87–5.11)
RDW: 15 % (ref 11.5–15.5)
WBC Count: 4.7 10*3/uL (ref 4.0–10.5)
nRBC: 0 % (ref 0.0–0.2)

## 2021-03-11 MED ORDER — SODIUM CHLORIDE 0.9% FLUSH
10.0000 mL | INTRAVENOUS | Status: DC | PRN
  Administered 2021-03-11: 10 mL
  Filled 2021-03-11: qty 10

## 2021-03-11 MED ORDER — ACETAMINOPHEN 325 MG PO TABS
650.0000 mg | ORAL_TABLET | Freq: Once | ORAL | Status: AC
  Administered 2021-03-11: 650 mg via ORAL

## 2021-03-11 MED ORDER — SODIUM CHLORIDE 0.9 % IV SOLN
420.0000 mg | Freq: Once | INTRAVENOUS | Status: AC
  Administered 2021-03-11: 420 mg via INTRAVENOUS
  Filled 2021-03-11: qty 14

## 2021-03-11 MED ORDER — SODIUM CHLORIDE 0.9 % IV SOLN
Freq: Once | INTRAVENOUS | Status: AC
  Filled 2021-03-11: qty 250

## 2021-03-11 MED ORDER — TRASTUZUMAB-DKST CHEMO 150 MG IV SOLR
6.0000 mg/kg | Freq: Once | INTRAVENOUS | Status: AC
  Administered 2021-03-11: 525 mg via INTRAVENOUS
  Filled 2021-03-11: qty 25

## 2021-03-11 MED ORDER — SODIUM CHLORIDE 0.9% FLUSH
10.0000 mL | Freq: Once | INTRAVENOUS | Status: AC
  Administered 2021-03-11: 10 mL
  Filled 2021-03-11: qty 10

## 2021-03-11 MED ORDER — HEPARIN SOD (PORK) LOCK FLUSH 100 UNIT/ML IV SOLN
500.0000 [IU] | Freq: Once | INTRAVENOUS | Status: AC | PRN
  Administered 2021-03-11: 500 [IU]
  Filled 2021-03-11: qty 5

## 2021-03-11 MED ORDER — ACETAMINOPHEN 325 MG PO TABS
ORAL_TABLET | ORAL | Status: AC
  Filled 2021-03-11: qty 2

## 2021-03-11 MED ORDER — DIPHENHYDRAMINE HCL 25 MG PO CAPS
ORAL_CAPSULE | ORAL | Status: AC
  Filled 2021-03-11: qty 2

## 2021-03-11 MED ORDER — DIPHENHYDRAMINE HCL 25 MG PO CAPS
50.0000 mg | ORAL_CAPSULE | Freq: Once | ORAL | Status: AC
  Administered 2021-03-11: 50 mg via ORAL

## 2021-03-11 NOTE — Patient Instructions (Signed)
[facility]  Discharge Instructions: Thank you for choosing [facility] to provide your oncology and hematology care.   If you have a lab appointment with the [facility], please go directly to the [facility] and check in at the registration area.   Wear comfortable clothing and clothing appropriate for easy access to any Portacath or PICC line.   We strive to give you quality time with your provider. You may need to reschedule your appointment if you arrive late (15 or more minutes).  Arriving late affects you and other patients whose appointments are after yours.  Also, if you miss three or more appointments without notifying the office, you may be dismissed from the clinic at the provider's discretion.      For prescription refill requests, have your pharmacy contact our office and allow 72 hours for refills to be completed.    Today you received the following chemotherapy and/or immunotherapy agents : Herceptin, Perjeta   To help prevent nausea and vomiting after your treatment, we encourage you to take your nausea medication as directed.  BELOW ARE SYMPTOMS THAT SHOULD BE REPORTED IMMEDIATELY: *FEVER GREATER THAN 100.4 F (38 C) OR HIGHER *CHILLS OR SWEATING *NAUSEA AND VOMITING THAT IS NOT CONTROLLED WITH YOUR NAUSEA MEDICATION *UNUSUAL SHORTNESS OF BREATH *UNUSUAL BRUISING OR BLEEDING *URINARY PROBLEMS (pain or burning when urinating, or frequent urination) *BOWEL PROBLEMS (unusual diarrhea, constipation, pain near the anus) TENDERNESS IN MOUTH AND THROAT WITH OR WITHOUT PRESENCE OF ULCERS (sore throat, sores in mouth, or a toothache) UNUSUAL RASH, SWELLING OR PAIN  UNUSUAL VAGINAL DISCHARGE OR ITCHING   Items with * indicate a potential emergency and should be followed up as soon as possible or go to the Emergency Department if any problems should occur.  Please show the CHEMOTHERAPY ALERT CARD or IMMUNOTHERAPY ALERT CARD at  check-in to the Emergency Department and triage nurse.  Should you have questions after your visit or need to cancel or reschedule your appointment, please contact [facility]  Dept: [phone]  and follow the prompts.  Office hours are 8:00 a.m. to 4:30 p.m. Monday - Friday. Please note that voicemails left after 4:00 p.m. may not be returned until the following business day.  We are closed weekends and major holidays. You have access to a nurse at all times for urgent questions. Please call the main number to the clinic Dept: [phone] and follow the prompts.   For any non-urgent questions, you may also contact your provider using MyChart. We now offer e-Visits for anyone 80 and older to request care online for non-urgent symptoms. For details visit [email or web address].   Also download the MyChart app! Go to the app store, search "MyChart", open the app, select [facility], and log in with your MyChart username and password.  Due to Covid, a mask is required upon entering the hospital/clinic. If you do not have a mask, one will be given to you upon arrival. For doctor visits, patients may have 1 support person aged 23 or older with them. For treatment visits, patients cannot have anyone with them due to current Covid guidelines and our immunocompromised population.

## 2021-03-11 NOTE — Progress Notes (Signed)
[place] [facility] Follow up:    Robin Drafts, MD [address] [place] [id number]   DIAGNOSIS: Cancer Staging Malignant neoplasm of upper-inner quadrant of left breast in female, estrogen receptor positive ([place]) Staging form: Breast, AJCC 8th Edition - Clinical stage from 06/10/2020: Stage IIIB (cT4d, cN3c, cM0, G3, ER+, PR-, HER2+) - Signed by Eppie Gibson, MD on 12/22/2020 Stage prefix: Initial diagnosis Histologic grading system: 3 grade system Laterality: Left Staged by: Pathologist and managing physician Stage used in treatment planning: Yes National guidelines used in treatment planning: Yes Type of national guideline used in treatment planning: NCCN   SUMMARY OF ONCOLOGIC HISTORY: Oncology History  Malignant neoplasm of upper-inner quadrant of left breast in female, estrogen receptor positive ([place])  06/05/2020 Initial Diagnosis   06/05/2020: Enlarging left breast with pain and diffuse skin thickening: Mammogram revealed 3.5 cm mass 11 o'clock position with 3 abnormal lymph nodes: 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 Right breast benign-appearing calcifications previous stereotactic biopsy was attempted but it was too superficial and felt to be benign.   06/10/2020 Cancer Staging   Staging form: Breast, AJCC 8th Edition - Clinical stage from 06/10/2020: Stage IIIB (cT4d, cN3c, cM0, G3, ER+, PR-, HER2+) - Signed by Eppie Gibson, MD on 12/22/2020  Stage prefix: Initial diagnosis  Histologic grading system: 3 grade system  Laterality: Left  Staged by: Pathologist and managing physician  Stage used in treatment planning: Yes  National guidelines used in treatment planning: Yes  Type of national guideline used in treatment planning: NCCN    06/25/2020 - 10/09/2020 Chemotherapy   Neoadjuvant chemotherapy with [facility] Perjeta 6 cycles followed by Herceptin Perjeta maintenance for 1  year        06/29/2020 Genetic Testing   Negative genetic testing: no pathogenic variants detected in [facility] Common Hereditary Cancers Panel.  The report date is June 29, 2020.   The Common Hereditary Cancers Panel offered by [facility] includes sequencing and/or deletion duplication testing of the following 48 genes: APC, ATM, AXIN2, BARD1, BMPR1A, BRCA1, BRCA2, BRIP1, CDH1, CDK4, CDKN2A (p14ARF), CDKN2A (p16INK4a), CHEK2, CTNNA1, DICER1, EPCAM (Deletion/duplication testing only), GREM1 (promoter region deletion/duplication testing only), KIT, MEN1, MLH1, MSH2, MSH3, MSH6, MUTYH, NBN, NF1, NHTL1, PALB2, PDGFRA, PMS2, POLD1, POLE, PTEN, RAD50, RAD51C, RAD51D, RNF43, SDHB, SDHC, SDHD, SMAD4, SMARCA4. STK11, TP53, TSC1, TSC2, and VHL.  The following genes were evaluated for sequence changes only: SDHA and HOXB13 c.251G>A variant only.   10/28/2020 -  Chemotherapy      Patient is on Antibody Plan: BREAST TRASTUZUMAB + PERTUZUMAB Q21D     11/09/2020 Surgery   Right lumpectomy and left mastectomy [name]):  Right breast: no evidence of malignancy Left breast: no residual carcinoma, with 19 left axillary lymph nodes negative for carcinoma.    - 02/09/2021 Radiation Therapy   Adjuvant radiation     CURRENT THERAPY: Herceptin/Perjeta  INTERVAL HISTORY: Robin Kennedy 45 y.o. female returns for evaluation prior to receivng herceptin/perjeta.  Her most recent echo was completed on 12/25/2020 and showed an EF of 60-65%.  She is doing well today.  She completed adjuvant radiation on 02/09/2021 and says she tolerated it well.  She did see PT last week and they gave her an insert for her bra to wear on the side to help with some swelling.  She is walking 30-45 minutes every morning on her treadmill.     Patient Active Problem List  Diagnosis Date Noted   Breast cancer metastasized to axillary lymph node, left ([place]) 11/09/2020   Port-A-Cath in place 07/02/2020   Genetic testing 06/29/2020    Malignant neoplasm of upper-inner quadrant of left breast in female, estrogen receptor positive ([place]) 06/05/2020   Post-operative state 12/20/2016   Fibroids 12/06/2016   Pelvic pain in female 12/06/2016   Diabetes mellitus ([place]) 06/19/2012    has No Known Allergies.  MEDICAL HISTORY: Past Medical History:  Diagnosis Date   Diabetes mellitus without complication ([place])    Personal history of chemotherapy    SVD (spontaneous vaginal delivery)    X 3    SURGICAL HISTORY: Past Surgical History:  Procedure Laterality Date   BREAST BIOPSY Left 06/02/2020   x2   BREAST BIOPSY Right 06/22/2020   CYSTOSCOPY N/A 12/20/2016   Procedure: CYSTOSCOPY;  Surgeon: Robin Drafts, MD;  Location: [facility];  Service: Gynecology;  Laterality: N/A;   MODIFIED MASTECTOMY Left 11/09/2020   Procedure: LEFT MODIFIED MASTECTOMY WITH LYMPH NODE DISSECTION;  Surgeon: [name], MD;  Location: [facility];  Service: General;  Laterality: Left;   PORTACATH PLACEMENT Left 06/23/2020   Procedure: INSERTION PORT-A-CATH WITH ULTRASOUND GUIDANCE;  Surgeon: [name], MD;  Location: [facility];  Service: General;  Laterality: Left;   RADIOACTIVE SEED GUIDED EXCISIONAL BREAST BIOPSY Right 11/09/2020   Procedure: RIGHT BREAST SEED LOCALIZED EXCISIONAL BIOPSY;  Surgeon: [name], MD;  Location: [facility];  Service: General;  Laterality: Right;   ROBOTIC ASSISTED TOTAL HYSTERECTOMY WITH BILATERAL SALPINGO OOPHERECTOMY Bilateral 12/20/2016   Procedure: ROBOTIC ASSISTED TOTAL HYSTERECTOMY WITH BILATERAL SALPINGO OOPHORECTOMY;  Surgeon: Robin Drafts, MD;  Location: [facility];  Service: Gynecology;  Laterality: Bilateral;   TUBAL LIGATION     WISDOM TOOTH EXTRACTION      SOCIAL HISTORY: Social History   Socioeconomic History   Marital status: Married    Spouse name: Not on file   Number of children: Not on file   Years of education: Not on file    Highest education level: Not on file  Occupational History   Not on file  Tobacco Use   Smoking status: Never   Smokeless tobacco: Never  Vaping Use   Vaping Use: Never used  Substance and Sexual Activity   Alcohol use: No   Drug use: No   Sexual activity: Yes    Birth control/protection: Surgical  Other Topics Concern   Not on file  Social History Narrative   ** Merged History Encounter **       Social Determinants of Health   Financial Resource Strain: Not on file  Food Insecurity: No Food Insecurity   Worried About Charity fundraiser in the Last Year: Never true   [place] in the Last Year: Never true  Transportation Needs: No Transportation Needs   Lack of Transportation (Medical): No   Lack of Transportation (Non-Medical): No  Physical Activity: Not on file  Stress: Not on file  Social Connections: Not on file  Intimate Partner Violence: Not on file    FAMILY HISTORY: Family History  Problem Relation Age of Onset   Diabetes Brother    Asthma Daughter    GER disease Daughter    Stroke Paternal Grandmother    Breast cancer Paternal Grandmother        unknown age   Sarcoidosis Mother    Hyperlipidemia Maternal Aunt    Stroke Maternal Uncle     Review  of Systems  Constitutional:  Positive for fatigue (intermittent). Negative for appetite change, chills, fever and unexpected weight change.  HENT:   Negative for hearing loss, lump/mass and trouble swallowing.   Eyes:  Negative for eye problems and icterus.  Respiratory:  Negative for chest tightness, cough and shortness of breath.   Cardiovascular:  Negative for chest pain, leg swelling and palpitations.  Gastrointestinal:  Negative for abdominal distention, abdominal pain, constipation, diarrhea, nausea and vomiting.  Endocrine: Negative for hot flashes.  Genitourinary:  Negative for difficulty urinating.   Musculoskeletal:  Negative for arthralgias.  Skin:  Negative for itching and rash.   Neurological:  Negative for dizziness, extremity weakness, headaches and numbness.  Hematological:  Negative for adenopathy. Does not bruise/bleed easily.  Psychiatric/Behavioral:  Negative for depression. The patient is not nervous/anxious.      PHYSICAL EXAMINATION  ECOG PERFORMANCE STATUS: 1 - Symptomatic but completely ambulatory  Vitals:   03/11/21 0854  BP: 98/60  Pulse: 100  Resp: 18  Temp: (!) 97.3 F (36.3 C)  SpO2: 99%    Physical Exam Constitutional:      General: She is not in acute distress.    Appearance: Normal appearance. She is not toxic-appearing.  HENT:     Head: Normocephalic and atraumatic.  Eyes:     General: No scleral icterus. Cardiovascular:     Rate and Rhythm: Normal rate and regular rhythm.     Pulses: Normal pulses.     Heart sounds: Normal heart sounds.  Pulmonary:     Effort: Pulmonary effort is normal.     Breath sounds: Normal breath sounds.  Abdominal:     General: Abdomen is flat. Bowel sounds are normal. There is no distension.     Palpations: Abdomen is soft.     Tenderness: There is no abdominal tenderness.  Musculoskeletal:        General: No swelling.     Cervical back: Neck supple.  Lymphadenopathy:     Cervical: No cervical adenopathy.  Skin:    General: Skin is warm and dry.     Findings: No rash.  Neurological:     General: No focal deficit present.     Mental Status: She is alert.  Psychiatric:        Mood and Affect: Mood normal.        Behavior: Behavior normal.    LABORATORY DATA:  CBC    Component Value Date/Time   WBC 4.7 03/11/2021 0846   WBC 10.1 12/21/2016 0552   RBC 3.63 (L) 03/11/2021 0846   HGB 9.8 (L) 03/11/2021 0846   HGB 12.4 09/13/2019 1042   HCT 30.3 (L) 03/11/2021 0846   HCT 37.9 09/13/2019 1042   PLT 239 03/11/2021 0846   PLT 269 09/13/2019 1042   MCV 83.5 03/11/2021 0846   MCV 84 09/13/2019 1042   MCH 27.0 03/11/2021 0846   MCHC 32.3 03/11/2021 0846   RDW 15.0 03/11/2021 0846    RDW 12.7 09/13/2019 1042   LYMPHSABS 1.1 03/11/2021 0846   LYMPHSABS 3.1 07/18/2018 1632   MONOABS 0.3 03/11/2021 0846   EOSABS 0.0 03/11/2021 0846   EOSABS 0.1 07/18/2018 1632   BASOSABS 0.0 03/11/2021 0846   BASOSABS 0.0 07/18/2018 1632    CMP     Component Value Date/Time   NA 141 03/11/2021 0846   NA 138 07/18/2018 1632   K 4.0 03/11/2021 0846   CL 106 03/11/2021 0846   CO2 26 03/11/2021 0846   GLUCOSE  194 (H) 03/11/2021 0846   BUN 15 03/11/2021 0846   BUN 9 07/18/2018 1632   CREATININE 0.93 03/11/2021 0846   CREATININE 0.66 06/06/2012 1746   CALCIUM 10.0 03/11/2021 0846   PROT 7.0 03/11/2021 0846   PROT 6.9 07/18/2018 1632   ALBUMIN 3.6 03/11/2021 0846   ALBUMIN 4.6 07/18/2018 1632   AST 7 (L) 03/11/2021 0846   ALT 7 03/11/2021 0846   ALKPHOS 64 03/11/2021 0846   BILITOT 0.6 03/11/2021 0846   GFRNONAA >60 03/11/2021 0846   GFRAA 129 07/18/2018 1632       ASSESSMENT and PLAN:   Malignant neoplasm of upper-inner quadrant of left breast in female, estrogen receptor positive ([place]) 06/05/2020: Enlarging left breast with pain and diffuse skin thickening: Mammogram revealed 3.5 cm mass 11 o'clock position with 3 abnormal lymph nodes: 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 Right breast benign-appearing calcifications previous stereotactic biopsy was attempted but it was too superficial and felt to be benign.   Recommendation based on multidisciplinary tumor board: 1. Neoadjuvant chemotherapy with [facility] Perjeta 6 cycles followed by Herceptin Perjeta maintenance for 1 year 2. 11/09/20: Right lumpectomy and left mastectomy [name]): Right breast: no evidence of malignancy Left breast: no residual carcinoma, with 19 left axillary lymph nodes negative for carcinoma. 3. Completed adjuvant radiation therapy on  02/09/2021 ------------------------------------------------------------------------------------------------------------------------------------------ 10/16/2020: CT CAP: Decrease in cutaneous thickening.  Decrease in size of left axillary, subpectoral and supraclavicular lymph nodes, no distant metastatic disease.   Treatment plan: Herceptin Perjeta maintenance, referral to radiation Echo on 12/25/2020 shows EF of 60-65%  Robin Kennedy is here today to receive Herceptin/Perjeta maintenance.  She continues on this with good tolerance and will  Proceed with this today.  We reviewed the risks of cardiotoxicity and I placed orders for repeat echocardiogram which is due in August.  She understands this.    Azarah tolerated radiation well.  She and I discussed that her breast cancer had a ER 10-20% positivity.  We discussed anti estrogen therapy.  She is s/p TAH 5 years ago and still has her ovaries.  She has a FH of blood clots.  I added on FSH, LH, and estradiol today to be drawn in infusion. My nurse Mickel Baas notified the infusion room.  We will get these labs, and I gave her information on both Anastrozole and Tamoxifen.  She will read about these medications and will talk to Dr. Lindi Adie when she sees him in September about which one she should start.    Camylle was encouraged to continue with her physical activity.  It is excellent that she is in a walking regimen and I applaud her for this.    She will return in 3 weeks for Herceptin/Perjeta and in 6 weeks for labs, f/u with Dr. Lindi Adie, and Herceptin/Perjeta.  She knows to call for any questions that may arise between now and her next appointment.  We are happy to see her sooner if needed.   Orders Placed This Encounter  Procedures   FSH-Follicle stimulating hormone    Standing Status:   Standing    Number of Occurrences:   1    Standing Expiration Date:   03/11/2022   LH-Luteinizing hormone    Standing Status:   Standing    Number of Occurrences:    1    Standing Expiration Date:   03/11/2022   Estradiol, Sensitive    Standing Status:   Standing  Number of Occurrences:   1    Standing Expiration Date:   03/11/2022   ECHOCARDIOGRAM COMPLETE    Standing Status:   Future    Standing Expiration Date:   03/11/2022    Order Specific Question:   Where should this test be performed    Answer:   [place]    Order Specific Question:   Perflutren DEFINITY (image enhancing agent) should be administered unless hypersensitivity or allergy exist    Answer:   Administer Perflutren    Order Specific Question:   Reason for exam-Echo    Answer:   Chemo  [id number]    Total encounter time: 30 minutes in face to face visit time, lab review, chart review, order entry, and documentation of the encounter.   Wilber Bihari, NP 03/11/21 9:32 AM Medical Oncology and Hematology [facility], [place] [address] Tel. [phone]    Fax. [phone]  *Total Encounter Time as defined by the Centers for Medicare and Medicaid Services includes, in addition to the face-to-face time of a patient visit (documented in the note above) non-face-to-face time: obtaining and reviewing outside history, ordering and reviewing medications, tests or procedures, care coordination (communications with other health care professionals or caregivers) and documentation in the medical record.

## 2021-03-11 NOTE — Assessment & Plan Note (Addendum)
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 Right breast benign-appearing calcifications previous stereotactic biopsy was attempted but it was too superficial and felt to be benign.  Recommendationbased on multidisciplinary tumor board: 1. Neoadjuvant chemotherapy with [facility] Perjeta 6 cycles followed by Herceptin Perjeta maintenance for 1 year 2.11/09/20:Right lumpectomy and left mastectomy Robin Kennedy): Right breast: no evidence of malignancy Left breast: no residual carcinoma, with 19 left axillary lymph nodes negative for carcinoma. 3. Completed adjuvant radiation therapy on 02/09/2021 ------------------------------------------------------------------------------------------------------------------------------------------ 10/16/2020: CT CAP: Decrease in cutaneous thickening. Decrease in size of left axillary, subpectoral and supraclavicular lymph nodes, no distant metastatic disease.  Treatment plan: Herceptin Perjeta maintenance, referral to radiation Echo on 12/25/2020 shows EF of 60-65%  Robin Kennedy is here today to receive Herceptin/Perjeta maintenance.  She continues on this with good tolerance and will  Proceed with this today.  We reviewed the risks of cardiotoxicity and I placed orders for repeat echocardiogram which is due in August.  She understands this.    Robin Kennedy tolerated radiation well.  She and I discussed that her breast cancer had a ER 10-20% positivity.  We discussed anti estrogen therapy.  She is s/p TAH 5 years ago and still has her ovaries.  She has a FH of blood clots.  I added on FSH, LH, and estradiol today to be drawn in infusion. My nurse Mickel Baas notified the infusion room.  We will get these labs, and I gave her information on both Anastrozole and Tamoxifen.  She will read about these  medications and will talk to Dr. Lindi Adie when she sees him in September about which one she should start.    Robin Kennedy was encouraged to continue with her physical activity.  It is excellent that she is in a walking regimen and I applaud her for this.    She will return in 3 weeks for Herceptin/Perjeta and in 6 weeks for labs, f/u with Dr. Lindi Adie, and Herceptin/Perjeta.  She knows to call for any questions that may arise between now and her next appointment.  We are happy to see her sooner if needed.

## 2021-03-11 NOTE — Patient Instructions (Signed)
Tamoxifen oral solution What is this medication? TAMOXIFEN (ta MOX i fen) blocks the effects of estrogen. It is commonly used to treat breast cancer. It is also used to decrease the chance of breast cancer coming back in women who have received treatment for the disease. It may also help prevent breast cancer in women who have a high risk of developing breastcancer. This medicine may be used for other purposes; ask your health care provider orpharmacist if you have questions. COMMON BRAND NAME(S): Soltamox What should I tell my care team before I take this medication? They need to know if you have any of these conditions: blood clots blood disease cataracts or impaired eyesight endometriosis high calcium levels high cholesterol irregular menstrual cycles liver disease stroke uterine fibroids an unusual reaction to tamoxifen, other medicines, foods, dyes, or preservatives pregnant or trying to get pregnant breast-feeding How should I use this medication? Take this medicine by mouth with a glass of water. Follow the directions on the prescription label. You can take it with or without food. Take your medicine at regular intervals. Do not take your medicine more often than directed. Do notstop taking except on your doctor's advice. A special MedGuide will be given to you by the pharmacist with eachprescription and refill. Be sure to read this information carefully each time. Talk to your pediatrician regarding the use of this medicine in children. Whilethis drug may be prescribed for selected conditions, precautions do apply. Overdosage: If you think you have taken too much of this medicine contact apoison control center or emergency room at once. NOTE: This medicine is only for you. Do not share this medicine with others. What if I miss a dose? If you miss a dose, take it as soon as you can. If it is almost time for yournext dose, take only that dose. Do not take double or extra doses. What  may interact with this medication? Do not take this medicine with any of the following medications: cisapride dronedarone pimozide thioridazine This medicine may also interact with the following medications: anastrozole certain medicines for seizures like carbamazepine, phenobarbital, phenytoin letrozole other medicines that prolong the QT interval (abnormal heart rhythm) paroxetine rifampin warfarin This list may not describe all possible interactions. Give your health care provider a list of all the medicines, herbs, non-prescription drugs, or dietary supplements you use. Also tell them if you smoke, drink alcohol, or use illegaldrugs. Some items may interact with your medicine. What should I watch for while using this medication? Visit your doctor or health care professional for regular checks on your progress. You will need regular pelvic exams, breast exams, and mammograms. If you are taking this medicine to reduce your risk of getting breast cancer, you should know that this medicine does not prevent all types of breast cancer. If breast cancer or other problems occur, there is no guarantee that it will befound at an early stage. Do not become pregnant while taking this medicine or for 2 months after stopping it. Women should inform their doctor if they wish to become pregnant or think they might be pregnant. There is a potential for serious side effects to an unborn child. Talk to your health care professional or pharmacist for more information. Do not breast-feed an infant while taking this medicine orfor 3 months after stopping it. This medicine may interfere with the ability to have a child. Talk with yourdoctor or health care professional if you are concerned about your fertility. What side effects may I  notice from receiving this medication? Side effects that you should report to your doctor or health care professionalas soon as possible: allergic reactions like skin rash, itching or  hives, swelling of the face, lips, or tongue changes in vision changes in your menstrual cycle difficulty walking or talking new breast lumps numbness pelvic pain or pressure redness, blistering, peeling or loosening of the skin, including inside the mouth signs and symptoms of a dangerous change in heartbeat or heart rhythm like chest pain, dizziness, fast or irregular heartbeat, palpitations, feeling faint or lightheaded, falls, breathing problems sudden chest pain swelling, pain or tenderness in your calf or leg unusual bruising or bleeding vaginal discharge that is bloody, brown, or rust weakness yellowing of the whites of the eyes or skin Side effects that usually do not require medical attention (report to yourdoctor or health care professional if they continue or are bothersome): fatigue hair loss, although uncommon and is usually mild headache hot flashes impotence (in men) nausea, vomiting (mild) vaginal discharge (white or clear) This list may not describe all possible side effects. Call your doctor for medical advice about side effects. You may report side effects to FDA at1-800-FDA-1088. Where should I keep my medication? Keep out of the reach of children. Store in the original package at room temperature between 20 and 25 degrees C (68 and 77 degrees F). Do not store above 25 degrees C (77 degrees F). DO NOT freeze or refrigerate. Protect from light. Keep container tightly closed. Use within 3 months of opening. Throw away any unused medicine after the expirationdate. NOTE: This sheet is a summary. It may not cover all possible information. If you have questions about this medicine, talk to your doctor, pharmacist, orhealth care provider.  2022 Elsevier/Gold Standard (2019-07-03 16:37:45) Anastrozole tablets What is this medication? ANASTROZOLE (an AS troe zole) is used to treat breast cancer in women who have gone through menopause. Some types of breast cancer depend on  estrogen to grow,and this medicine can stop tumor growth by blocking estrogen production. This medicine may be used for other purposes; ask your health care provider orpharmacist if you have questions. COMMON BRAND NAME(S): Arimidex What should I tell my care team before I take this medication? They need to know if you have any of these conditions: bone problems heart disease high cholesterol an unusual or allergic reaction to anastrozole, other medicines, foods, dyes, or preservatives pregnant or trying to get pregnant breast-feeding How should I use this medication? Take this medicine by mouth with a glass of water. Follow the directions on the prescription label. You can take it with or without food. If it upsets your stomach, take it with food. Take your medicine at regular intervals. Do not take it more often than directed. Do not stop taking except on your doctor'sadvice. Talk to your pediatrician regarding the use of this medicine in children.Special care may be needed. Overdosage: If you think you have taken too much of this medicine contact apoison control center or emergency room at once. NOTE: This medicine is only for you. Do not share this medicine with others. What if I miss a dose? If you miss a dose, take it as soon as you can. If it is almost time for yournext dose, take only that dose. Do not take double or extra doses. What may interact with this medication? This medicine may interact with the following medications: female hormones, like estrogens or progestins and birth control pills, patches, rings, or injections tamoxifen This  list may not describe all possible interactions. Give your health care provider a list of all the medicines, herbs, non-prescription drugs, or dietary supplements you use. Also tell them if you smoke, drink alcohol, or use illegaldrugs. Some items may interact with your medicine. What should I watch for while using this medication? Visit your  doctor or health care professional for regular checks on your progress. Let your doctor or health care professional know about any unusualvaginal bleeding. Do not become pregnant while taking this medicine or for at least 3 weeks after stopping it. Women should inform their doctor if they wish to become pregnant or think they might be pregnant. There is a potential for serious side effects to an unborn child. Talk to your health care professional or pharmacist for more information. Do not breast-feed an infant while taking this medicine orfor 2 weeks after stopping it. This medicine may interfere with the ability to have a child. Talk with yourdoctor or health care professional if you are concerned about your fertility. Using this medicine for a long time may increase your risk of low bone mass.Talk to your doctor about bone health. You should make sure that you get enough calcium and vitamin D while you are taking this medicine. Discuss the foods you eat and the vitamins you take withyour health care professional. What side effects may I notice from receiving this medication? Side effects that you should report to your doctor or health care professionalas soon as possible: allergic reactions like skin rash, itching or hives, swelling of the face, lips, or tongue signs and symptoms of a blood clot such as breathing problems; changes in vision; chest pain; sudden headache; pain, swelling, warmth in the leg; trouble speaking; sudden numbness or weakness of the face, arm, or leg signs and symptoms of infection like fever or chills; cough; sore throat; pain or trouble passing urine Side effects that usually do not require medical attention (report to yourdoctor or health care professional if they continue or are bothersome): bone pain dizziness hair loss headache hot flashes joint pain muscle pain signs of decreased red blood cells - unusually weak or tired, feeling faint or lightheaded, falls vaginal  discharge, itching, or odor in women This list may not describe all possible side effects. Call your doctor for medical advice about side effects. You may report side effects to FDA at1-800-FDA-1088. Where should I keep my medication? Keep out of the reach of children. Store at room temperature between 20 and 25 degrees C (68 and 77 degrees F).Throw away any unused medicine after the expiration date. NOTE: This sheet is a summary. It may not cover all possible information. If you have questions about this medicine, talk to your doctor, pharmacist, orhealth care provider.  2022 Elsevier/Gold Standard (2017-08-14 14:56:51)

## 2021-03-12 LAB — FOLLICLE STIMULATING HORMONE: FSH: 101 m[IU]/mL

## 2021-03-12 LAB — LUTEINIZING HORMONE: LH: 74.3 m[IU]/mL

## 2021-03-16 LAB — ESTRADIOL, ULTRA SENS: Estradiol, Sensitive: 5.2 pg/mL

## 2021-03-24 ENCOUNTER — Ambulatory Visit
Admission: RE | Admit: 2021-03-24 | Discharge: 2021-03-24 | Disposition: A | Discharge status: Home / Self Care | Payer: 59 | Source: Ambulatory Visit | Attending: Radiation Oncology | Admitting: Radiation Oncology

## 2021-03-24 ENCOUNTER — Other Ambulatory Visit: Payer: Self-pay

## 2021-03-24 ENCOUNTER — Ambulatory Visit (HOSPITAL_BASED_OUTPATIENT_CLINIC_OR_DEPARTMENT_OTHER)
Admission: RE | Admit: 2021-03-24 | Discharge: 2021-03-24 | Disposition: A | Discharge status: Home / Self Care | Payer: 59 | Source: Ambulatory Visit | Attending: Adult Health | Admitting: Adult Health

## 2021-03-24 VITALS — BP 103/78 | HR 74 | Temp 97.5°F | Resp 18 | Ht 68.0 in | Wt 182.2 lb

## 2021-03-24 DIAGNOSIS — C50212 Malignant neoplasm of upper-inner quadrant of left female breast: Secondary | ICD-10-CM

## 2021-03-24 DIAGNOSIS — Z01818 Encounter for other preprocedural examination: Secondary | ICD-10-CM | POA: Insufficient documentation

## 2021-03-24 DIAGNOSIS — Z170 Estrogen receptor positive status [ER+]: Secondary | ICD-10-CM | POA: Insufficient documentation

## 2021-03-24 DIAGNOSIS — Z0189 Encounter for other specified special examinations: Secondary | ICD-10-CM | POA: Diagnosis not present

## 2021-03-24 DIAGNOSIS — Z923 Personal history of irradiation: Secondary | ICD-10-CM | POA: Insufficient documentation

## 2021-03-24 LAB — ECHOCARDIOGRAM COMPLETE
Area-P 1/2: 4.6 cm2
S' Lateral: 3.1 cm
Single Plane A4C EF: 53.1 %

## 2021-03-24 NOTE — Progress Notes (Signed)
Robin Kennedy presents today for follow-up after completing radiation to her left breast on 02/09/2021  Pain: Denies any pain, but does report mild tenderness to left chest/axilla Skin: Reports skin is intact and healing well ROM: States that as long as she does her PT stretching she denies any concerns. If she's not able to do her exercises, she reports pulling/stretching in axilla area Lymphedema: Had final PT session about 2 weeks ago, but was advised to return should she have swelling/concerns.  MedOnc F/U: Had F/U with Annabelle Harman on 03/11/2021 and will see Dr. Nicholas Lose in September.  Other issues of note: Reports slight improvement in fatigue; she still gets bouts of tiredness, but is able to manage. Denies any nausea, or changes in bowel/urinary habits (did have an episode of diarrhea last night, but attributes that to the spicy food she tried). Overall she reports she feels well, and is pleased with her progress this far  Pt reports Yes No Comments  Tamoxifen '[]'$  '[]'$  **Has been given information about anastrazole and tamoxifen, and will let Dr. Lindi Adie know her decision when she sees him in September   Letrozole '[]'$  '[]'$    Anastrazole '[]'$  '[]'$    Mammogram '[]'$  Date: TBD '[]'$     Wt Readings from Last 3 Encounters:  03/24/21 182 lb 4 oz (82.7 kg)  03/11/21 179 lb 6.4 oz (81.4 kg)  01/27/21 180 lb 9.6 oz (81.9 kg)   Vitals:   03/24/21 1108  BP: 103/78  Pulse: 74  Resp: 18  Temp: (!) 97.5 F (36.4 C)  SpO2: 100%

## 2021-03-24 NOTE — Progress Notes (Signed)
  Echocardiogram 2D Echocardiogram has been performed.  Robin Kennedy 03/24/2021, 10:41 AM

## 2021-03-25 ENCOUNTER — Encounter: Payer: Self-pay | Admitting: Radiation Oncology

## 2021-03-25 NOTE — Progress Notes (Signed)
Radiation Oncology         [phone]) [phone] ________________________________  Name: Tanazia Fazekas MRN: [id number]  Date: 03/24/2021  DOB: 18-Apr-1976  Follow-Up Visit Note  Outpatient  CC: Lavonia Drafts, MD  [name], MD  Diagnosis and Prior Radiotherapy:    ICD-10-CM   1. Malignant neoplasm of upper-inner quadrant of left breast in female, estrogen receptor positive ([place])  C50.212    Z17.0       CHIEF COMPLAINT: Here for follow-up and surveillance of breast cancer  Narrative:  The patient returns today for routine follow-up.  Mrs. Wooldridge presents today for follow-up after completing radiation to her left breast on 02/09/2021  Pain: Denies any pain, but does report mild tenderness to left chest/axilla Skin: Reports skin is intact and healing well ROM: States that as long as she does her PT stretching she denies any concerns. If she's not able to do her exercises, she reports pulling/stretching in axilla area Lymphedema: Had final PT session about 2 weeks ago, but was advised to return should she have swelling/concerns.  MedOnc F/U: Had F/U with Annabelle Harman on 03/11/2021 and will see Dr. Nicholas Lose in September.  Other issues of note: Reports slight improvement in fatigue; she still gets bouts of tiredness, but is able to manage. Denies any nausea, or changes in bowel/urinary habits (did have an episode of diarrhea last night, but attributes that to the spicy food she tried). Overall she reports she feels well, and is pleased with her progress this far  Pt reports Yes No Comments  Tamoxifen '[]'$  '[]'$  **Has been given information about anastrazole and tamoxifen, and will let Dr. Lindi Adie know her decision when she sees him in September   Letrozole '[]'$  '[]'$    Anastrazole '[]'$  '[]'$    Mammogram '[]'$  Date: TBD '[]'$     Wt Readings from Last 3 Encounters:  03/24/21 182 lb 4 oz (82.7 kg)  03/11/21 179 lb 6.4 oz (81.4 kg)  01/27/21 180 lb 9.6 oz (81.9 kg)   Vitals:    03/24/21 1108  BP: 103/78  Pulse: 74  Resp: 18  Temp: (!) 97.5 F (36.4 C)  SpO2: 100%                                  ALLERGIES:  has No Known Allergies.  Meds: Current Outpatient Medications  Medication Sig Dispense Refill   glucose blood (ONE TOUCH ULTRA TEST) test strip Use to check cbgs qd 100 each 11   lidocaine-prilocaine (EMLA) cream Apply 1 application topically as needed. 30 g 1   metFORMIN (GLUCOPHAGE-XR) 500 MG 24 hr tablet Take 1,000 mg by mouth 2 (two) times daily.  3   TRESIBA FLEXTOUCH 100 UNIT/ML FlexTouch Pen Inject 10 Units into the skin daily after breakfast.     No current facility-administered medications for this encounter.    Physical Findings: The patient is in no acute distress. Patient is alert and oriented.  height is '5\' 8"'$  (1.727 m) and weight is 182 lb 4 oz (82.7 kg). Her temporal temperature is 97.5 F (36.4 C) (abnormal). Her blood pressure is 103/78 and her pulse is 74. Her respiration is 18 and oxygen saturation is 100%. .    Satisfactory skin healing in radiotherapy fields. Residual hyperpigmentation of left chest wall. Skin is smooth, intact.   Lab Findings: Lab Results  Component Value Date   WBC 4.7 03/11/2021   HGB 9.8 (L) 03/11/2021  HCT 30.3 (L) 03/11/2021   MCV 83.5 03/11/2021   PLT 239 03/11/2021    Radiographic Findings: ECHOCARDIOGRAM COMPLETE  Result Date: 03/24/2021    ECHOCARDIOGRAM REPORT   Patient Name:   Carmesha Raftery Date of Exam: 03/24/2021 Medical Rec #:  [id number]             Height:       68.0 in Accession #:    [id number]            Weight:       179.4 lb Date of Birth:  23-Aug-1975             BSA:          1.951 m Patient Age:    45 years              BP:           98/60 mmHg Patient Gender: F                     HR:           100 bpm. Exam Location:  Outpatient Procedure: 2D Echo, Cardiac Doppler, Color Doppler and Strain Analysis Indications:    Chemo  History:        Patient has prior history of  Echocardiogram examinations, most                 recent 12/25/2020. Breast cancer, chemo.  Sonographer:    Dustin Flock Referring Phys: [place]  1. Left ventricular ejection fraction, by estimation, is 60 to 65%. The left ventricle has normal function. The left ventricle has no regional wall motion abnormalities. Left ventricular diastolic parameters are consistent with Grade I diastolic dysfunction (impaired relaxation). The average left ventricular global longitudinal strain is -20.5 %. The global longitudinal strain is normal.  2. Right ventricular systolic function is normal. The right ventricular size is normal. There is normal pulmonary artery systolic pressure.  3. The mitral valve is normal in structure. Trivial mitral valve regurgitation. No evidence of mitral stenosis.  4. The aortic valve is normal in structure. Aortic valve regurgitation is not visualized. No aortic stenosis is present.  5. The inferior vena cava is normal in size with greater than 50% respiratory variability, suggesting right atrial pressure of 3 mmHg. Comparison(s): No change since prior. GLS -19.2 >> -19.3. FINDINGS  Left Ventricle: Left ventricular ejection fraction, by estimation, is 60 to 65%. The left ventricle has normal function. The left ventricle has no regional wall motion abnormalities. The average left ventricular global longitudinal strain is -20.5 %. The global longitudinal strain is normal. The left ventricular internal cavity size was normal in size. There is no left ventricular hypertrophy. Left ventricular diastolic parameters are consistent with Grade I diastolic dysfunction (impaired relaxation). Right Ventricle: The right ventricular size is normal. No increase in right ventricular wall thickness. Right ventricular systolic function is normal. There is normal pulmonary artery systolic pressure. The tricuspid regurgitant velocity is 2.07 m/s, and  with an assumed right atrial  pressure of 3 mmHg, the estimated right ventricular systolic pressure is [id number] mmHg. Left Atrium: Left atrial size was normal in size. Right Atrium: Right atrial size was normal in size. Pericardium: There is no evidence of pericardial effusion. Mitral Valve: The mitral valve is normal in structure. Trivial mitral valve regurgitation. No evidence of mitral valve stenosis. Tricuspid Valve: The tricuspid valve is normal in structure. Tricuspid valve regurgitation is trivial.  No evidence of tricuspid stenosis. Aortic Valve: The aortic valve is normal in structure. Aortic valve regurgitation is not visualized. No aortic stenosis is present. Pulmonic Valve: The pulmonic valve was normal in structure. Pulmonic valve regurgitation is not visualized. No evidence of pulmonic stenosis. Aorta: The aortic root is normal in size and structure. Venous: The inferior vena cava is normal in size with greater than 50% respiratory variability, suggesting right atrial pressure of 3 mmHg. IAS/Shunts: No atrial level shunt detected by color flow Doppler.  LEFT VENTRICLE PLAX 2D LVIDd:         4.20 cm     Diastology LVIDs:         3.10 cm     LV e' medial:    6.64 cm/s LV PW:         1.00 cm     LV E/e' medial:  8.7 LV IVS:        1.10 cm     LV e' lateral:   7.40 cm/s LVOT diam:     2.00 cm     LV E/e' lateral: 7.8 LV SV:         48 LV SV Index:   24          2D Longitudinal Strain LVOT Area:     3.14 cm    2D Strain GLS Avg:     -20.5 %  LV Volumes (MOD) LV vol d, MOD A4C: 68.3 ml LV vol s, MOD A4C: 32.0 ml LV SV MOD A4C:     68.3 ml RIGHT VENTRICLE RV Basal diam:  3.00 cm RV S prime:     8.38 cm/s TAPSE (M-mode): 2.1 cm LEFT ATRIUM             Index       RIGHT ATRIUM           Index LA diam:        3.90 cm 2.00 cm/m  RA Area:     10.60 cm LA Vol (A2C):   24.7 ml 12.66 ml/m RA Volume:   23.20 ml  11.89 ml/m LA Vol (A4C):   26.5 ml 13.58 ml/m LA Biplane Vol: 26.9 ml 13.78 ml/m  AORTIC VALVE LVOT Vmax:   74.40 cm/s LVOT Vmean:   51.100 cm/s LVOT VTI:    0.152 m  AORTA Ao Root diam: 2.80 cm MITRAL VALVE               TRICUSPID VALVE MV Area (PHT): 4.60 cm    TR Peak grad:   17.1 mmHg MV Decel Time: 165 msec    TR Vmax:        207.00 cm/s MV E velocity: 57.80 cm/s MV A velocity: 63.80 cm/s  SHUNTS MV E/A ratio:  0.91        Systemic VTI:  0.15 m                            Systemic Diam: 2.00 cm Candee Furbish MD Electronically signed by Candee Furbish MD Signature Date/Time: 03/24/2021/1:15:26 PM    Final     Impression/Plan: Healing well from radiotherapy - Continue skin care with topical Vitamin E Oil and / or lotion for at least 2 more months for further healing.  I encouraged her to followup with medical oncology. I will see her back on an as-needed basis. I have encouraged her to call if she has any issues or concerns in  the future. I wished her the very best.  On date of service, in total, I spent 15 minutes on this encounter. Patient was seen in person.  _____________________________________   Eppie Gibson, MD

## 2021-03-31 ENCOUNTER — Inpatient Hospital Stay: Payer: 59 | Attending: Hematology and Oncology

## 2021-03-31 ENCOUNTER — Other Ambulatory Visit: Payer: Self-pay

## 2021-03-31 ENCOUNTER — Encounter: Payer: Self-pay | Admitting: Hematology and Oncology

## 2021-03-31 VITALS — BP 119/79 | HR 90 | Temp 98.3°F | Resp 14 | Wt 183.0 lb

## 2021-03-31 DIAGNOSIS — Z170 Estrogen receptor positive status [ER+]: Secondary | ICD-10-CM | POA: Insufficient documentation

## 2021-03-31 DIAGNOSIS — C50212 Malignant neoplasm of upper-inner quadrant of left female breast: Secondary | ICD-10-CM

## 2021-03-31 DIAGNOSIS — Z79899 Other long term (current) drug therapy: Secondary | ICD-10-CM | POA: Diagnosis not present

## 2021-03-31 DIAGNOSIS — Z5112 Encounter for antineoplastic immunotherapy: Secondary | ICD-10-CM | POA: Diagnosis not present

## 2021-03-31 MED ORDER — SODIUM CHLORIDE 0.9 % IV SOLN: Freq: Once | INTRAVENOUS | Status: AC

## 2021-03-31 MED ORDER — HEPARIN SOD (PORK) LOCK FLUSH 100 UNIT/ML IV SOLN
500.0000 [IU] | Freq: Once | INTRAVENOUS | Status: AC | PRN
  Administered 2021-03-31: 500 [IU]

## 2021-03-31 MED ORDER — DIPHENHYDRAMINE HCL 25 MG PO CAPS
50.0000 mg | ORAL_CAPSULE | Freq: Once | ORAL | Status: AC
  Administered 2021-03-31: 50 mg via ORAL
  Filled 2021-03-31: qty 2

## 2021-03-31 MED ORDER — TRASTUZUMAB-DKST CHEMO 150 MG IV SOLR
6.0000 mg/kg | Freq: Once | INTRAVENOUS | Status: AC
  Administered 2021-03-31: 525 mg via INTRAVENOUS
  Filled 2021-03-31: qty 25

## 2021-03-31 MED ORDER — ACETAMINOPHEN 325 MG PO TABS
650.0000 mg | ORAL_TABLET | Freq: Once | ORAL | Status: AC
  Administered 2021-03-31: 650 mg via ORAL
  Filled 2021-03-31: qty 2

## 2021-03-31 MED ORDER — SODIUM CHLORIDE 0.9 % IV SOLN
420.0000 mg | Freq: Once | INTRAVENOUS | Status: AC
  Administered 2021-03-31: 420 mg via INTRAVENOUS
  Filled 2021-03-31: qty 14

## 2021-03-31 MED ORDER — SODIUM CHLORIDE 0.9% FLUSH
10.0000 mL | INTRAVENOUS | Status: DC | PRN
  Administered 2021-03-31: 10 mL

## 2021-03-31 NOTE — Progress Notes (Signed)
                                                                                                                                                             Patient Name: Robin Kennedy MRN: [id number] DOB: March 01, 1976 Referring Physician: [name] (Profile Not Attached) Date of Service: 02/09/2021 [facility] [place]                                                        End Of Treatment Note  Diagnoses: C50.212-Malignant neoplasm of upper-inner quadrant of left female breast  Cancer Staging:  Cancer Staging Malignant neoplasm of upper-inner quadrant of left breast in female, estrogen receptor positive ([place]) Staging form: Breast, AJCC 8th Edition - Clinical stage from 06/10/2020: Stage IIIB (cT4d, cN3c, cM0, G3, ER+, PR-, HER2+) - Signed by Eppie Gibson, MD on 12/22/2020 Stage prefix: Initial diagnosis Histologic grading system: 3 grade system Laterality: Left Staged by: Pathologist and managing physician Stage used in treatment planning: Yes National guidelines used in treatment planning: Yes Type of national guideline used in treatment planning: NCCN  Intent: Curative  Radiation Treatment Dates: 12/29/2020 through 02/09/2021 Site Technique Total Dose (Gy) Dose per Fx (Gy) Completed Fx Beam Energies  Chest Wall, Left: CW_Lt_IMN 3D 50/50 2 25/25 6X  Chest Wall, Left: CW_Lt_SCV_PAB 3D 50/50 2 25/25 6X, 15X  Chest Wall, Left: CW_Lt_Bst Electron 10/10 2 5/5 6E   Narrative: The patient tolerated radiation therapy relatively well.   Plan: The patient will follow-up with radiation oncology in 13mo. -----------------------------------  Eppie Gibson, MD

## 2021-03-31 NOTE — Patient Instructions (Signed)
[facility]  Discharge Instructions: Thank you for choosing [facility] to provide your oncology and hematology care.   If you have a lab appointment with the [facility], please go directly to the [facility] and check in at the registration area.   Wear comfortable clothing and clothing appropriate for easy access to any Portacath or PICC line.   We strive to give you quality time with your provider. You may need to reschedule your appointment if you arrive late (15 or more minutes).  Arriving late affects you and other patients whose appointments are after yours.  Also, if you miss three or more appointments without notifying the office, you may be dismissed from the clinic at the provider's discretion.      For prescription refill requests, have your pharmacy contact our office and allow 72 hours for refills to be completed.    Today you received the following chemotherapy and/or immunotherapy agents : Herceptin, Perjeta   To help prevent nausea and vomiting after your treatment, we encourage you to take your nausea medication as directed.  BELOW ARE SYMPTOMS THAT SHOULD BE REPORTED IMMEDIATELY: *FEVER GREATER THAN 100.4 F (38 C) OR HIGHER *CHILLS OR SWEATING *NAUSEA AND VOMITING THAT IS NOT CONTROLLED WITH YOUR NAUSEA MEDICATION *UNUSUAL SHORTNESS OF BREATH *UNUSUAL BRUISING OR BLEEDING *URINARY PROBLEMS (pain or burning when urinating, or frequent urination) *BOWEL PROBLEMS (unusual diarrhea, constipation, pain near the anus) TENDERNESS IN MOUTH AND THROAT WITH OR WITHOUT PRESENCE OF ULCERS (sore throat, sores in mouth, or a toothache) UNUSUAL RASH, SWELLING OR PAIN  UNUSUAL VAGINAL DISCHARGE OR ITCHING   Items with * indicate a potential emergency and should be followed up as soon as possible or go to the Emergency Department if any problems should occur.  Please show the CHEMOTHERAPY ALERT CARD or IMMUNOTHERAPY ALERT CARD at  check-in to the Emergency Department and triage nurse.  Should you have questions after your visit or need to cancel or reschedule your appointment, please contact [facility]  Dept: [phone]  and follow the prompts.  Office hours are 8:00 a.m. to 4:30 p.m. Monday - Friday. Please note that voicemails left after 4:00 p.m. may not be returned until the following business day.  We are closed weekends and major holidays. You have access to a nurse at all times for urgent questions. Please call the main number to the clinic Dept: [phone] and follow the prompts.   For any non-urgent questions, you may also contact your provider using MyChart. We now offer e-Visits for anyone 63 and older to request care online for non-urgent symptoms. For details visit [email or web address].   Also download the MyChart app! Go to the app store, search "MyChart", open the app, select [facility], and log in with your MyChart username and password.  Due to Covid, a mask is required upon entering the hospital/clinic. If you do not have a mask, one will be given to you upon arrival. For doctor visits, patients may have 1 support person aged 61 or older with them. For treatment visits, patients cannot have anyone with them due to current Covid guidelines and our immunocompromised population.

## 2021-04-15 ENCOUNTER — Encounter: Payer: Self-pay | Admitting: Hematology and Oncology

## 2021-04-15 ENCOUNTER — Emergency Department (HOSPITAL_COMMUNITY): Payer: 59

## 2021-04-15 ENCOUNTER — Other Ambulatory Visit: Payer: Self-pay

## 2021-04-15 ENCOUNTER — Encounter (HOSPITAL_COMMUNITY): Payer: Self-pay

## 2021-04-15 ENCOUNTER — Emergency Department (HOSPITAL_COMMUNITY)
Admission: EM | Admit: 2021-04-15 | Discharge: 2021-04-15 | Disposition: A | Discharge status: Home / Self Care | Payer: 59 | Attending: Emergency Medicine | Admitting: Emergency Medicine

## 2021-04-15 ENCOUNTER — Encounter: Payer: Self-pay | Admitting: *Deleted

## 2021-04-15 DIAGNOSIS — E119 Type 2 diabetes mellitus without complications: Secondary | ICD-10-CM | POA: Diagnosis not present

## 2021-04-15 DIAGNOSIS — N644 Mastodynia: Secondary | ICD-10-CM

## 2021-04-15 DIAGNOSIS — Z7984 Long term (current) use of oral hypoglycemic drugs: Secondary | ICD-10-CM | POA: Diagnosis not present

## 2021-04-15 DIAGNOSIS — R0602 Shortness of breath: Secondary | ICD-10-CM | POA: Insufficient documentation

## 2021-04-15 DIAGNOSIS — R519 Headache, unspecified: Secondary | ICD-10-CM

## 2021-04-15 DIAGNOSIS — Z853 Personal history of malignant neoplasm of breast: Secondary | ICD-10-CM | POA: Diagnosis not present

## 2021-04-15 LAB — CBC WITH DIFFERENTIAL/PLATELET
Abs Immature Granulocytes: 0.01 10*3/uL (ref 0.00–0.07)
Basophils Absolute: 0 10*3/uL (ref 0.0–0.1)
Basophils Relative: 0 %
Eosinophils Absolute: 0.1 10*3/uL (ref 0.0–0.5)
Eosinophils Relative: 1 %
HCT: 32.1 % — ABNORMAL LOW (ref 36.0–46.0)
Hemoglobin: 10 g/dL — ABNORMAL LOW (ref 12.0–15.0)
Immature Granulocytes: 0 %
Lymphocytes Relative: 35 %
Lymphs Abs: 1.7 10*3/uL (ref 0.7–4.0)
MCH: 27.1 pg (ref 26.0–34.0)
MCHC: 31.2 g/dL (ref 30.0–36.0)
MCV: 87 fL (ref 80.0–100.0)
Monocytes Absolute: 0.3 10*3/uL (ref 0.1–1.0)
Monocytes Relative: 7 %
Neutro Abs: 2.8 10*3/uL (ref 1.7–7.7)
Neutrophils Relative %: 57 %
Platelets: 242 10*3/uL (ref 150–400)
RBC: 3.69 MIL/uL — ABNORMAL LOW (ref 3.87–5.11)
RDW: 15.2 % (ref 11.5–15.5)
WBC: 5 10*3/uL (ref 4.0–10.5)
nRBC: 0 % (ref 0.0–0.2)

## 2021-04-15 LAB — BASIC METABOLIC PANEL
Anion gap: 8 (ref 5–15)
BUN: 15 mg/dL (ref 6–20)
CO2: 26 mmol/L (ref 22–32)
Calcium: 9.3 mg/dL (ref 8.9–10.3)
Chloride: 103 mmol/L (ref 98–111)
Creatinine, Ser: 0.75 mg/dL (ref 0.44–1.00)
GFR, Estimated: >60 mL/min (ref >60)
Glucose, Bld: 226 mg/dL — ABNORMAL HIGH (ref 70–99)
Potassium: 3.6 mmol/L (ref 3.5–5.1)
Sodium: 137 mmol/L (ref 135–145)

## 2021-04-15 LAB — TROPONIN I (HIGH SENSITIVITY)
Troponin I (High Sensitivity): 5 ng/L (ref <18)
Troponin I (High Sensitivity): 4 ng/L (ref <18)

## 2021-04-15 LAB — I-STAT BETA HCG BLOOD, ED (MC, WL, AP ONLY): I-stat hCG, quantitative: <5 m[IU]/mL (ref <5)

## 2021-04-15 MED ORDER — IOHEXOL 350 MG/ML SOLN
46.0000 mL | Freq: Once | INTRAVENOUS | Status: AC | PRN
  Administered 2021-04-15: 46 mL via INTRAVENOUS

## 2021-04-15 MED ORDER — SODIUM CHLORIDE 0.9 % IV BOLUS
1000.0000 mL | Freq: Once | INTRAVENOUS | Status: AC
  Administered 2021-04-15: 1000 mL via INTRAVENOUS

## 2021-04-15 MED ORDER — FENTANYL CITRATE PF 50 MCG/ML IJ SOSY
50.0000 ug | PREFILLED_SYRINGE | Freq: Once | INTRAMUSCULAR | Status: AC
  Administered 2021-04-15: 50 ug via INTRAVENOUS
  Filled 2021-04-15: qty 1

## 2021-04-15 NOTE — ED Notes (Signed)
Pt to Ct

## 2021-04-15 NOTE — ED Provider Notes (Signed)
[facility] EMERGENCY DEPARTMENT Provider Note   CSN: [id number] Arrival date & time: 04/15/21  0141     History Chief Complaint  Patient presents with   Breast Pain   Headache    Robin Kennedy is a 45 y.o. female.  44 year old female with past medical history of diabetes, left breast cancer, presents with complaint of pain in her right breast as well as right-sided headache with tingling in her right chest area today.  Also reports shortness of breath.  Denies diaphoresis, nausea, vomiting, unilateral weakness or numbness.  States has never had a headache like this before although not severe and denies sudden onset.  Left breast is tender to the outer portion of the breast without skin changes or nipple discharge.  States that she had a mass removed from her right breast earlier this year when her left breast cancer was diagnosed and mastectomy performed.  States the mass removed from her right breast was benign.  Patient is scheduled to follow-up with her oncologist next Wednesday.   Headache Associated symptoms: no abdominal pain, no back pain, no cough, no diarrhea, no fever, no nausea, no vomiting and no weakness       Past Medical History:  Diagnosis Date   Diabetes mellitus without complication ([facility])    Personal history of chemotherapy    SVD (spontaneous vaginal delivery)    X 3    Patient Active Problem List   Diagnosis Date Noted   Breast cancer metastasized to axillary lymph node, left ([facility]) 11/09/2020   Port-A-Cath in place 07/02/2020   Genetic testing 06/29/2020   Malignant neoplasm of upper-inner quadrant of left breast in female, estrogen receptor positive ([facility]) 06/05/2020   Post-operative state 12/20/2016   Fibroids 12/06/2016   Pelvic pain in female 12/06/2016   Diabetes mellitus ([facility]) 06/19/2012    Past Surgical History:  Procedure Laterality Date   BREAST BIOPSY Left 06/02/2020   x2   BREAST BIOPSY Right 06/22/2020    CYSTOSCOPY N/A 12/20/2016   Procedure: CYSTOSCOPY;  Surgeon: Lavonia Drafts, MD;  Location: [facility];  Service: Gynecology;  Laterality: N/A;   MODIFIED MASTECTOMY Left 11/09/2020   Procedure: LEFT MODIFIED MASTECTOMY WITH LYMPH NODE DISSECTION;  Surgeon: [name], MD;  Location: [facility];  Service: General;  Laterality: Left;   PORTACATH PLACEMENT Left 06/23/2020   Procedure: INSERTION PORT-A-CATH WITH ULTRASOUND GUIDANCE;  Surgeon: [name], MD;  Location: [facility];  Service: General;  Laterality: Left;   RADIOACTIVE SEED GUIDED EXCISIONAL BREAST BIOPSY Right 11/09/2020   Procedure: RIGHT BREAST SEED LOCALIZED EXCISIONAL BIOPSY;  Surgeon: [name], MD;  Location: [facility];  Service: General;  Laterality: Right;   ROBOTIC ASSISTED TOTAL HYSTERECTOMY WITH BILATERAL SALPINGO OOPHERECTOMY Bilateral 12/20/2016   Procedure: ROBOTIC ASSISTED TOTAL HYSTERECTOMY WITH BILATERAL SALPINGO OOPHORECTOMY;  Surgeon: Lavonia Drafts, MD;  Location: [facility];  Service: Gynecology;  Laterality: Bilateral;   TUBAL LIGATION     WISDOM TOOTH EXTRACTION       OB History     Gravida  4   Para  3   Term  3   Preterm  0   AB  1   Living  3      SAB  0   IAB  1   Ectopic  0   Multiple      Live Births  3           Family History  Problem Relation Age of  Onset   Diabetes Brother    Asthma Daughter    GER disease Daughter    Stroke Paternal Grandmother    Breast cancer Paternal Grandmother        unknown age   Sarcoidosis Mother    Hyperlipidemia Maternal Aunt    Stroke Maternal Uncle     Social History   Tobacco Use   Smoking status: Never   Smokeless tobacco: Never  Vaping Use   Vaping Use: Never used  Substance Use Topics   Alcohol use: No   Drug use: No    Home Medications Prior to Admission medications   Medication Sig Start Date End Date Taking? Authorizing Provider  glucose blood (ONE TOUCH  ULTRA TEST) test strip Use to check cbgs qd 09/21/12   Shawnee Knapp, MD  lidocaine-prilocaine (EMLA) cream Apply 1 application topically as needed. 10/28/20   Nicholas Lose, MD  metFORMIN (GLUCOPHAGE-XR) 500 MG 24 hr tablet Take 1,000 mg by mouth 2 (two) times daily. 05/09/18   [provider]  TRESIBA FLEXTOUCH 100 UNIT/ML FlexTouch Pen Inject 10 Units into the skin daily after breakfast. 07/11/20   [provider]  prochlorperazine (COMPAZINE) 10 MG tablet Take 1 tablet (10 mg total) by mouth every 6 (six) hours as needed (Nausea or vomiting). 06/10/20 10/28/20  Nicholas Lose, MD    Allergies    Patient has no known allergies.  Review of Systems   Review of Systems  Constitutional:  Negative for chills, diaphoresis and fever.  Eyes:  Negative for visual disturbance.  Respiratory:  Positive for shortness of breath. Negative for cough and chest tightness.   Cardiovascular:  Negative for chest pain and leg swelling.  Gastrointestinal:  Negative for abdominal pain, constipation, diarrhea, nausea and vomiting.  Genitourinary:  Negative for dysuria and frequency.  Musculoskeletal:  Negative for back pain.  Skin:  Negative for rash and wound.  Allergic/Immunologic: Positive for immunocompromised state.  Neurological:  Positive for headaches. Negative for speech difficulty and weakness.  Hematological:  Negative for adenopathy.  Psychiatric/Behavioral:  Negative for confusion.   All other systems reviewed and are negative.  Physical Exam Updated Vital Signs BP 103/77   Pulse 72   Temp 98.4 F (36.9 C) (Oral)   Resp 16   Ht '5\' 8"'$  (1.727 m)   Wt 83 kg   LMP 11/06/2016 (Exact Date)   SpO2 99%   BMI 27.82 kg/m   Physical Exam Vitals and nursing note reviewed.  Constitutional:      General: She is not in acute distress.    Appearance: She is well-developed. She is not diaphoretic.  HENT:     Head: Normocephalic and atraumatic.  Eyes:     Extraocular Movements:  Extraocular movements intact.     Right eye: Normal extraocular motion.     Left eye: Normal extraocular motion.     Pupils: Pupils are equal, round, and reactive to light.  Cardiovascular:     Rate and Rhythm: Normal rate and regular rhythm.     Heart sounds: Normal heart sounds.  Pulmonary:     Effort: Pulmonary effort is normal.     Breath sounds: Normal breath sounds.  Chest:  Breasts:    Right: Tenderness present. No inverted nipple, mass, nipple discharge or skin change.     Comments: Tenderness to lateral right breast without overlying skin changes. No palpable mass.  Abdominal:     Palpations: Abdomen is soft.     Tenderness: There is no  abdominal tenderness.  Musculoskeletal:     Cervical back: Normal range of motion and neck supple.  Skin:    General: Skin is warm and dry.  Neurological:     Mental Status: She is alert and oriented to person, place, and time.     GCS: GCS eye subscore is 4. GCS verbal subscore is 5. GCS motor subscore is 6.     Cranial Nerves: No cranial nerve deficit.  Psychiatric:        Behavior: Behavior normal.    ED Results / Procedures / Treatments   Labs (all labs ordered are listed, but only abnormal results are displayed) Labs Reviewed  CBC WITH DIFFERENTIAL/PLATELET - Abnormal; Notable for the following components:      Result Value   RBC 3.69 (*)    Hemoglobin 10.0 (*)    HCT 32.1 (*)    All other components within normal limits  BASIC METABOLIC PANEL - Abnormal; Notable for the following components:   Glucose, Bld 226 (*)    All other components within normal limits  I-STAT BETA HCG BLOOD, ED ([facility], [facility], [facility] ONLY)  TROPONIN I (HIGH SENSITIVITY)  TROPONIN I (HIGH SENSITIVITY)    EKG EKG Interpretation  Date/Time:  Thursday April 15 2021 01:51:30 EDT Ventricular Rate:  74 PR Interval:  168 QRS Duration: 74 QT Interval:  396 QTC Calculation: 439 R Axis:   4 Text Interpretation: Normal sinus rhythm with sinus arrhythmia Low  voltage QRS Septal infarct , age undetermined Abnormal ECG No acute changes Confirmed by Addison Lank [phone]) on 04/15/2021 5:40:31 AM  Radiology DG Chest 2 View  Result Date: 04/15/2021 CLINICAL DATA:  Chest pain EXAM: CHEST - 2 VIEW COMPARISON:  CT chest dated 10/16/2020 FINDINGS: Lungs are clear.  No pleural effusion or pneumothorax. The heart is normal in size. Left chest power port terminates at the cavoatrial junction. Surgical clips in the left axilla. Displaced tubal ligation clip in the left mid abdomen. IMPRESSION: Normal chest radiographs. Electronically Signed   By: Julian Hy M.D.   On: 04/15/2021 02:36   CT Head Wo Contrast  Result Date: 04/15/2021 CLINICAL DATA:  Headache.  History of breast cancer. EXAM: CT HEAD WITHOUT CONTRAST TECHNIQUE: Contiguous axial images were obtained from the base of the skull through the vertex without intravenous contrast. COMPARISON:  MRI head 01/31/2007.  CT head 01/16/2016 FINDINGS: Brain: No evidence of acute infarction, hemorrhage, hydrocephalus, extra-axial collection or mass lesion/mass effect. Vascular: Negative for hyperdense vessel Skull: Negative Sinuses/Orbits: Negative Other: None IMPRESSION: Normal ct head Electronically Signed   By: Franchot Gallo M.D.   On: 04/15/2021 07:56   CT Angio Chest PE W and/or Wo Contrast  Result Date: 04/15/2021 CLINICAL DATA:  Worsening chest pain with inspiration. EXAM: CT ANGIOGRAPHY CHEST WITH CONTRAST TECHNIQUE: Multidetector CT imaging of the chest was performed using the standard protocol during bolus administration of intravenous contrast. Multiplanar CT image reconstructions and MIPs were obtained to evaluate the vascular anatomy. CONTRAST:  68m OMNIPAQUE IOHEXOL 350 MG/ML SOLN COMPARISON:  Standard CT chest 10/16/2020 FINDINGS: Cardiovascular: Heart is borderline enlarged. No substantial pericardial effusion. No thoracic aortic aneurysm. Left Port-A-Cath tip is positioned in the right atrium. There is  no filling defect within the opacified pulmonary arteries to suggest the presence of an acute pulmonary embolus. Mediastinum/Nodes: Amorphous soft tissue in the anterior mediastinum is new in the interval 11 mm short axis subcarinal lymph node is new in the interval. There is no hilar lymphadenopathy. The esophagus  has normal imaging features. Surgical clips noted left axilla with apparent granulation/scarring from adenectomy. Patchy airspace disease noted left lung apex with bandlike consolidative opacity anterior left upper lobe. Anterior retraction of the left major fissure is consistent with volume loss in the left upper lobe. Subsegmental atelectasis noted right middle lobe with some compressive atelectasis in the lower lobes bilaterally. No pleural effusion. Lungs/Pleura: Upper Abdomen: Unremarkable. Musculoskeletal: No worrisome lytic or sclerotic osseous abnormality. Left mastectomy. Review of the MIP images confirms the above findings. IMPRESSION: 1. No CT evidence for acute pulmonary embolus. 2. Patchy airspace disease in the left lung apex with bandlike consolidative opacity in the anterior left upper lobe. Sagittal imaging suggests that this is the same process that tracks from the anterior left upper lobe into the apex, likely post radiation change. Superimposed infection can not be excluded by imaging. 3. Amorphous soft tissue in the anterior mediastinum is new with mildly enlarged subcarinal node on today's study. Close attention on follow-up recommended. PET-CT could be used to further evaluate if there is clinical concern for metastatic disease. Electronically Signed   By: Misty Stanley M.D.   On: 04/15/2021 06:48    Procedures Procedures   Medications Ordered in ED Medications  sodium chloride 0.9 % bolus 1,000 mL (0 mLs Intravenous Stopped 04/15/21 0858)  fentaNYL (SUBLIMAZE) injection 50 mcg (50 mcg Intravenous Given 04/15/21 0608)  iohexol (OMNIPAQUE) 350 MG/ML injection 46 mL (46 mLs  Intravenous Contrast Given 04/15/21 0630)    ED Course  I have reviewed the triage vital signs and the nursing notes.  Pertinent labs & imaging results that were available during my care of the patient were reviewed by me and considered in my medical decision making (see chart for details).  Clinical Course as of 04/15/21 [id number]  Thu Apr 15, 2021  0901 45 yo female with complaint of right breast pain, SHOB, right side headache with history of recent breast cancer, prior radiation.  Found to have right side breast tenderness without mass or skin changes. Recommend follow up with her oncology team as scheduled next week. CTA negative for PE, does show new node, recommend discuss as next weeks follow up. Right side headache with normal neuro exam, CT head unremarkable. Labs reviewed and that significant findings including CBC, BMP (hyperglycemia with glucose of 226, monitors and follow-up with PCP for med management), troponin x2 unremarkable.  EKG without ischemic changes. [LM]    Clinical Course User Index [LM] Roque Lias   MDM Rules/Calculators/A&P                           Final Clinical Impression(s) / ED Diagnoses Final diagnoses:  Breast tenderness in female  Acute nonintractable headache, unspecified headache type  Shortness of breath    Rx / DC Orders ED Discharge Orders     None        Tacy Learn, PA-C 04/15/21 [id number]    Wyvonnia Dusky, MD 04/15/21 1118

## 2021-04-15 NOTE — ED Triage Notes (Signed)
Right breast pain (sharp in character) and headache x 2 days. Hx of breast ca (left). Denies any recent trauma/ falls. Hurts worse when taking a deep breath.

## 2021-04-15 NOTE — ED Provider Notes (Signed)
Emergency Medicine Provider Triage Evaluation Note  Robin Kennedy , a 45 y.o. female  was evaluated in triage.  Pt complains of right sided chest/breast pain.  Hx of breast cancer.  Pain is worsened with deep breathing.  Denies cough or fever.  Review of Systems  Positive: Chest pain, breast pain Negative: Cough, fever, rash  Physical Exam  Ht '5\' 8"'$  (1.727 m)   Wt 83 kg   LMP 11/06/2016 (Exact Date)   BMI 27.82 kg/m  Gen:   Awake, no distress   Resp:  Normal effort  MSK:   Moves extremities without difficulty  Other:  No rash or sign of infection  Medical Decision Making  Medically screening exam initiated at 1:49 AM.  Appropriate orders placed.  Sherel Spruiell was informed that the remainder of the evaluation will be completed by another provider, this initial triage assessment does not replace that evaluation, and the importance of remaining in the ED until their evaluation is complete.  CP/breast pain   Montine Circle, PA-C 04/15/21 0150    Palumbo, April, MD 04/15/21 0157

## 2021-04-15 NOTE — Discharge Instructions (Addendum)
Discussed your CT of your chest with your oncology team at follow-up next week.  Return to the emergency room for worsening or concerning symptoms.

## 2021-04-20 NOTE — Progress Notes (Signed)
Patient Care Team: Lavonia Drafts, MD as PCP - General (Obstetrics and Gynecology) Shawnee Knapp, MD (Family Medicine) Mauro Kaufmann, RN as Oncology Nurse Navigator Rockwell Germany, RN as Oncology Nurse Navigator [name], MD as Consulting Physician (General Surgery) Nicholas Lose, MD as Consulting Physician (Hematology and Oncology) Eppie Gibson, MD as Attending Physician (Radiation Oncology)  DIAGNOSIS:    ICD-10-CM   1. Malignant neoplasm of upper-inner quadrant of left breast in female, estrogen receptor positive ([place])  C50.212    Z17.0       SUMMARY OF ONCOLOGIC HISTORY: Oncology History  Malignant neoplasm of upper-inner quadrant of left breast in female, estrogen receptor positive ([place])  06/05/2020 Initial Diagnosis   06/05/2020: Enlarging left breast with pain and diffuse skin thickening: Mammogram revealed 3.5 cm mass 11 o'clock position with 3 abnormal lymph nodes: 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 Right breast benign-appearing calcifications previous stereotactic biopsy was attempted but it was too superficial and felt to be benign.   06/10/2020 Cancer Staging   Staging form: Breast, AJCC 8th Edition - Clinical stage from 06/10/2020: Stage IIIB (cT4d, cN3c, cM0, G3, ER+, PR-, HER2+) - Signed by Eppie Gibson, MD on 12/22/2020 Stage prefix: Initial diagnosis Histologic grading system: 3 grade system Laterality: Left Staged by: Pathologist and managing physician Stage used in treatment planning: Yes National guidelines used in treatment planning: Yes Type of national guideline used in treatment planning: NCCN   06/25/2020 - 10/09/2020 Chemotherapy   Neoadjuvant chemotherapy with [facility] Perjeta 6 cycles followed by Herceptin Perjeta maintenance for 1 year        06/29/2020 Genetic Testing   Negative genetic testing: no pathogenic variants detected in [facility] Common Hereditary Cancers Panel.  The report  date is June 29, 2020.   The Common Hereditary Cancers Panel offered by [facility] includes sequencing and/or deletion duplication testing of the following 48 genes: APC, ATM, AXIN2, BARD1, BMPR1A, BRCA1, BRCA2, BRIP1, CDH1, CDK4, CDKN2A (p14ARF), CDKN2A (p16INK4a), CHEK2, CTNNA1, DICER1, EPCAM (Deletion/duplication testing only), GREM1 (promoter region deletion/duplication testing only), KIT, MEN1, MLH1, MSH2, MSH3, MSH6, MUTYH, NBN, NF1, NHTL1, PALB2, PDGFRA, PMS2, POLD1, POLE, PTEN, RAD50, RAD51C, RAD51D, RNF43, SDHB, SDHC, SDHD, SMAD4, SMARCA4. STK11, TP53, TSC1, TSC2, and VHL.  The following genes were evaluated for sequence changes only: SDHA and HOXB13 c.251G>A variant only.   10/28/2020 -  Chemotherapy      Patient is on Antibody Plan: BREAST TRASTUZUMAB + PERTUZUMAB Q21D     11/09/2020 Surgery   Right lumpectomy and left mastectomy [name]):  Right breast: no evidence of malignancy Left breast: no residual carcinoma, with 19 left axillary lymph nodes negative for carcinoma.    - 02/09/2021 Radiation Therapy   Adjuvant radiation     CHIEF COMPLIANT: Herceptin Perjeta maintenance  INTERVAL HISTORY: Robin Kennedy is a 45 y.o. with above-mentioned history of inflammatory left breast cancer who completed neoadjuvant chemotherapy, right lumpectomy and left mastectomy, and is currently on Herceptin Perjeta maintenance. She presents to the clinic today for treatment.  Recent emergency room visit for chest pain with a perform CT of the chest.  It showed a soft tissue mass in anterior mediastinum along with subpectoral lymph node.  Chest pain has resolved.  She is tolerating Herceptin Perjeta fairly well with exception of mild diarrhea.  ALLERGIES:  has No Known Allergies.  MEDICATIONS:  Current Outpatient Medications  Medication Sig Dispense Refill   glucose blood (ONE TOUCH ULTRA TEST)  test strip Use to check cbgs qd 100 each 11   lidocaine-prilocaine (EMLA) cream Apply 1  application topically as needed. 30 g 1   metFORMIN (GLUCOPHAGE-XR) 500 MG 24 hr tablet Take 1,000 mg by mouth 2 (two) times daily.  3   TRESIBA FLEXTOUCH 100 UNIT/ML FlexTouch Pen Inject 10 Units into the skin daily after breakfast.     No current facility-administered medications for this visit.    PHYSICAL EXAMINATION: ECOG PERFORMANCE STATUS: 1 - Symptomatic but completely ambulatory  Vitals:   04/21/21 1001  BP: 116/86  Pulse: 89  Resp: 18  Temp: 97.7 F (36.5 C)  SpO2: 100%   Filed Weights   04/21/21 1001  Weight: 184 lb (83.5 kg)    LABORATORY DATA:  I have reviewed the data as listed CMP Latest Ref Rng & Units 04/15/2021 03/11/2021 01/27/2021  Glucose 70 - 99 mg/dL 226(H) 194(H) 203(H)  BUN 6 - 20 mg/dL '15 15 17  ' Creatinine 0.44 - 1.00 mg/dL 0.75 0.93 0.73  Sodium 135 - 145 mmol/L 137 141 141  Potassium 3.5 - 5.1 mmol/L 3.6 4.0 3.9  Chloride 98 - 111 mmol/L 103 106 110  CO2 22 - 32 mmol/L '26 26 23  ' Calcium 8.9 - 10.3 mg/dL 9.3 10.0 9.6  Total Protein 6.5 - 8.1 g/dL - 7.0 6.8  Total Bilirubin 0.3 - 1.2 mg/dL - 0.6 0.4  Alkaline Phos 38 - 126 U/L - 64 49  AST 15 - 41 U/L - 7(L) 6(L)  ALT 0 - 44 U/L - 7 7    Lab Results  Component Value Date   WBC 4.4 04/21/2021   HGB 9.8 (L) 04/21/2021   HCT 31.0 (L) 04/21/2021   MCV 85.9 04/21/2021   PLT 219 04/21/2021   NEUTROABS 2.6 04/21/2021    ASSESSMENT & PLAN:  Malignant neoplasm of upper-inner quadrant of left breast in female, estrogen receptor positive ([place]) 06/05/2020: Enlarging left breast with pain and diffuse skin thickening: Mammogram revealed 3.5 cm mass 11 o'clock position with 3 abnormal lymph nodes: 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 Right breast benign-appearing calcifications previous stereotactic biopsy was attempted but it was too superficial and felt to be benign.   Recommendation based on multidisciplinary tumor board: 1. Neoadjuvant chemotherapy with  [facility] Perjeta 6 cycles followed by Herceptin Perjeta maintenance for 1 year 2. 11/09/20: Right lumpectomy and left mastectomy [name]): Right breast: no evidence of malignancy Left breast: no residual carcinoma, with 19 left axillary lymph nodes negative for carcinoma. 3. Followed by adjuvant radiation therapy (due to inflammatory breast changes at diagnosis) ------------------------------------------------------------------------------------------------------------------------------------------ 10/16/2020: CT CAP: Decrease in cutaneous thickening.  Decrease in size of left axillary, subpectoral and supraclavicular lymph nodes, no distant metastatic disease.   Treatment plan: Herceptin Perjeta maintenance, currently receiving adjuvant radiation Herceptin Perjeta toxicities: Occasional diarrhea   Emergency room visit for chest pain: CT chest revealed anterior mediastinal soft tissue nodule 1.1 cm Plan: We will obtain a PET/CT for further evaluation. We will call her after the PET scan to discuss results.  Letrozole counseling: We discussed the risks and benefits of anti-estrogen therapy with aromatase inhibitors. These include but not limited to insomnia, hot flashes, mood changes, vaginal dryness, bone density loss, and weight gain. We strongly believe that the benefits far outweigh the risks. Patient understands these risks and consented to starting treatment. Planned treatment duration is 7 years.   We may consider doing CT scans in  March 2023 to evaluate the subpectoral and supraclavicular lymph nodes.   Return to clinic every 3 weeks for Herceptin Perjeta and every 6 weeks with labs and follow-up with me.  She completes Herceptin Perjeta by 06/23/2021      No orders of the defined types were placed in this encounter.  The patient has a good understanding of the overall plan. she agrees with it. she will call with any problems that may develop before the next visit here.  Total time spent:  30 mins including face to face time and time spent for planning, charting and coordination of care  Rulon Eisenmenger, MD, MPH 04/21/2021  I, Thana Ates, am acting as scribe for Dr. Nicholas Lose.  I have reviewed the above documentation for accuracy and completeness, and I agree with the above.

## 2021-04-21 ENCOUNTER — Other Ambulatory Visit: Payer: Self-pay

## 2021-04-21 ENCOUNTER — Inpatient Hospital Stay (HOSPITAL_BASED_OUTPATIENT_CLINIC_OR_DEPARTMENT_OTHER): Payer: 59 | Admitting: Hematology and Oncology

## 2021-04-21 ENCOUNTER — Encounter: Payer: Self-pay | Admitting: *Deleted

## 2021-04-21 ENCOUNTER — Inpatient Hospital Stay: Payer: 59 | Attending: Hematology and Oncology

## 2021-04-21 ENCOUNTER — Inpatient Hospital Stay: Payer: 59

## 2021-04-21 VITALS — BP 122/74 | HR 74 | Temp 97.8°F | Resp 18

## 2021-04-21 DIAGNOSIS — Z9221 Personal history of antineoplastic chemotherapy: Secondary | ICD-10-CM | POA: Diagnosis not present

## 2021-04-21 DIAGNOSIS — Z9012 Acquired absence of left breast and nipple: Secondary | ICD-10-CM | POA: Insufficient documentation

## 2021-04-21 DIAGNOSIS — Z5112 Encounter for antineoplastic immunotherapy: Secondary | ICD-10-CM | POA: Diagnosis present

## 2021-04-21 DIAGNOSIS — Z79899 Other long term (current) drug therapy: Secondary | ICD-10-CM | POA: Diagnosis not present

## 2021-04-21 DIAGNOSIS — C50212 Malignant neoplasm of upper-inner quadrant of left female breast: Secondary | ICD-10-CM

## 2021-04-21 DIAGNOSIS — Z170 Estrogen receptor positive status [ER+]: Secondary | ICD-10-CM | POA: Diagnosis present

## 2021-04-21 DIAGNOSIS — Z923 Personal history of irradiation: Secondary | ICD-10-CM | POA: Insufficient documentation

## 2021-04-21 LAB — CMP (CANCER CENTER ONLY)
ALT: <6 U/L (ref 0–44)
AST: 7 U/L — ABNORMAL LOW (ref 15–41)
Albumin: 3.6 g/dL (ref 3.5–5.0)
Alkaline Phosphatase: 54 U/L (ref 38–126)
Anion gap: 12 (ref 5–15)
BUN: 17 mg/dL (ref 6–20)
CO2: 21 mmol/L — ABNORMAL LOW (ref 22–32)
Calcium: 9.6 mg/dL (ref 8.9–10.3)
Chloride: 108 mmol/L (ref 98–111)
Creatinine: 0.86 mg/dL (ref 0.44–1.00)
GFR, Estimated: >60 mL/min (ref >60)
Glucose, Bld: 245 mg/dL — ABNORMAL HIGH (ref 70–99)
Potassium: 3.7 mmol/L (ref 3.5–5.1)
Sodium: 141 mmol/L (ref 135–145)
Total Bilirubin: 0.4 mg/dL (ref 0.3–1.2)
Total Protein: 6.8 g/dL (ref 6.5–8.1)

## 2021-04-21 LAB — CBC WITH DIFFERENTIAL (CANCER CENTER ONLY)
Abs Immature Granulocytes: 0.01 10*3/uL (ref 0.00–0.07)
Basophils Absolute: 0 10*3/uL (ref 0.0–0.1)
Basophils Relative: 1 %
Eosinophils Absolute: 0.1 10*3/uL (ref 0.0–0.5)
Eosinophils Relative: 2 %
HCT: 31 % — ABNORMAL LOW (ref 36.0–46.0)
Hemoglobin: 9.8 g/dL — ABNORMAL LOW (ref 12.0–15.0)
Immature Granulocytes: 0 %
Lymphocytes Relative: 31 %
Lymphs Abs: 1.4 10*3/uL (ref 0.7–4.0)
MCH: 27.1 pg (ref 26.0–34.0)
MCHC: 31.6 g/dL (ref 30.0–36.0)
MCV: 85.9 fL (ref 80.0–100.0)
Monocytes Absolute: 0.3 10*3/uL (ref 0.1–1.0)
Monocytes Relative: 7 %
Neutro Abs: 2.6 10*3/uL (ref 1.7–7.7)
Neutrophils Relative %: 59 %
Platelet Count: 219 10*3/uL (ref 150–400)
RBC: 3.61 MIL/uL — ABNORMAL LOW (ref 3.87–5.11)
RDW: 14.8 % (ref 11.5–15.5)
WBC Count: 4.4 10*3/uL (ref 4.0–10.5)
nRBC: 0 % (ref 0.0–0.2)

## 2021-04-21 MED ORDER — ACETAMINOPHEN 325 MG PO TABS
650.0000 mg | ORAL_TABLET | Freq: Once | ORAL | Status: AC
Start: 2021-04-21 — End: 2021-04-21
  Administered 2021-04-21: 650 mg via ORAL
  Filled 2021-04-21: qty 2

## 2021-04-21 MED ORDER — HEPARIN SOD (PORK) LOCK FLUSH 100 UNIT/ML IV SOLN
500.0000 [IU] | Freq: Once | INTRAVENOUS | Status: AC | PRN
  Administered 2021-04-21: 500 [IU]

## 2021-04-21 MED ORDER — SODIUM CHLORIDE 0.9% FLUSH
10.0000 mL | INTRAVENOUS | Status: DC | PRN
  Administered 2021-04-21: 10 mL

## 2021-04-21 MED ORDER — DIPHENHYDRAMINE HCL 25 MG PO CAPS
50.0000 mg | ORAL_CAPSULE | Freq: Once | ORAL | Status: AC
  Administered 2021-04-21: 50 mg via ORAL
  Filled 2021-04-21: qty 2

## 2021-04-21 MED ORDER — SODIUM CHLORIDE 0.9 % IV SOLN
420.0000 mg | Freq: Once | INTRAVENOUS | Status: AC
  Administered 2021-04-21: 420 mg via INTRAVENOUS
  Filled 2021-04-21: qty 14

## 2021-04-21 MED ORDER — SODIUM CHLORIDE 0.9 % IV SOLN: Freq: Once | INTRAVENOUS | Status: AC

## 2021-04-21 MED ORDER — LETROZOLE 2.5 MG PO TABS: 2.5000 mg | ORAL_TABLET | Freq: Every day | ORAL | 3 refills | Status: DC

## 2021-04-21 MED ORDER — TRASTUZUMAB-DKST CHEMO 150 MG IV SOLR
6.0000 mg/kg | Freq: Once | INTRAVENOUS | Status: AC
  Administered 2021-04-21: 525 mg via INTRAVENOUS
  Filled 2021-04-21: qty 25

## 2021-04-21 NOTE — Assessment & Plan Note (Signed)
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 Right breast benign-appearing calcifications previous stereotactic biopsy was attempted but it was too superficial and felt to be benign.  Recommendationbased on multidisciplinary tumor board: 1. Neoadjuvant chemotherapy with [facility] Perjeta 6 cycles followed by Herceptin Perjeta maintenance for 1 year 2.11/09/20:Right lumpectomy and left mastectomy [name]): Right breast: no evidence of malignancy Left breast: no residual carcinoma, with 19 left axillary lymph nodes negative for carcinoma. 3. Followed by adjuvant radiation therapy(due to inflammatory breast changes at diagnosis) ------------------------------------------------------------------------------------------------------------------------------------------ 10/16/2020: CT CAP: Decrease in cutaneous thickening. Decrease in size of left axillary, subpectoral and supraclavicular lymph nodes, no distant metastatic disease.  Treatment plan: Herceptin Perjeta maintenance, currently receiving adjuvant radiation Herceptin Perjeta toxicities: Occasional diarrhea  Surveillance: She will need a mammogram of the right breast every year. We may consider doing CT scans in March 2023 to evaluate the subpectoral and supraclavicular lymph nodes.  Return to clinic every 3 weeks for Herceptin Perjeta and every 6 weeks with labs and follow-up with me.

## 2021-04-21 NOTE — Patient Instructions (Signed)
[facility] MEDICAL ONCOLOGY  Discharge Instructions: Thank you for choosing [facility] to provide your oncology and hematology care.   If you have a lab appointment with the Cancer Center, please go directly to the Cancer Center and check in at the registration area.   Wear comfortable clothing and clothing appropriate for easy access to any Portacath or PICC line.   We strive to give you quality time with your provider. You may need to reschedule your appointment if you arrive late (15 or more minutes).  Arriving late affects you and other patients whose appointments are after yours.  Also, if you miss three or more appointments without notifying the office, you may be dismissed from the clinic at the provider's discretion.      For prescription refill requests, have your pharmacy contact our office and allow 72 hours for refills to be completed.    Today you received the following chemotherapy and/or immunotherapy agents: Ogivri/Perjeta.      To help prevent nausea and vomiting after your treatment, we encourage you to take your nausea medication as directed.  BELOW ARE SYMPTOMS THAT SHOULD BE REPORTED IMMEDIATELY: . *FEVER GREATER THAN 100.4 F (38 C) OR HIGHER . *CHILLS OR SWEATING . *NAUSEA AND VOMITING THAT IS NOT CONTROLLED WITH YOUR NAUSEA MEDICATION . *UNUSUAL SHORTNESS OF BREATH . *UNUSUAL BRUISING OR BLEEDING . *URINARY PROBLEMS (pain or burning when urinating, or frequent urination) . *BOWEL PROBLEMS (unusual diarrhea, constipation, pain near the anus) . TENDERNESS IN MOUTH AND THROAT WITH OR WITHOUT PRESENCE OF ULCERS (sore throat, sores in mouth, or a toothache) . UNUSUAL RASH, SWELLING OR PAIN  . UNUSUAL VAGINAL DISCHARGE OR ITCHING   Items with * indicate a potential emergency and should be followed up as soon as possible or go to the Emergency Department if any problems should occur.  Please show the CHEMOTHERAPY ALERT CARD or IMMUNOTHERAPY  ALERT CARD at check-in to the Emergency Department and triage nurse.  Should you have questions after your visit or need to cancel or reschedule your appointment, please contact [facility]  Dept: [phone]  and follow the prompts.  Office hours are 8:00 a.m. to 4:30 p.m. Monday - Friday. Please note that voicemails left after 4:00 p.m. may not be returned until the following business day.  We are closed weekends and major holidays. You have access to a nurse at all times for urgent questions. Please call the main number to the clinic Dept: [phone] and follow the prompts.   For any non-urgent questions, you may also contact your provider using MyChart. We now offer e-Visits for anyone 18 and older to request care online for non-urgent symptoms. For details visit [email or web address].   Also download the MyChart app! Go to the app store, search "MyChart", open the app, select [facility], and log in with your MyChart username and password.  Due to Covid, a mask is required upon entering the hospital/clinic. If you do not have a mask, one will be given to you upon arrival. For doctor visits, patients may have 1 support person aged 18 or older with them. For treatment visits, patients cannot have anyone with them due to current Covid guidelines and our immunocompromised population.   

## 2021-04-21 NOTE — Patient Instructions (Signed)
Implanted Port Home Guide An implanted port is a device that is placed under the skin. It is usually placed in the chest. The device can be used to give IV medicine, to take blood, or for dialysis. You may have an implanted port if: You need IV medicine that would be irritating to the small veins in your hands or arms. You need IV medicines, such as antibiotics, for a long period of time. You need IV nutrition for a long period of time. You need dialysis. When you have a port, your health care provider can choose to use the port instead of veins in your arms for these procedures. You may have fewer limitations when using a port than you would if you used other types of long-term IVs, and you will likely be able to return to normal activities after your incision heals. An implanted port has two main parts: Reservoir. The reservoir is the part where a needle is inserted to give medicines or draw blood. The reservoir is round. After it is placed, it appears as a small, raised area under your skin. Catheter. The catheter is a thin, flexible tube that connects the reservoir to a vein. Medicine that is inserted into the reservoir goes into the catheter and then into the vein. How is my port accessed? To access your port: A numbing cream may be placed on the skin over the port site. Your health care provider will put on a mask and sterile gloves. The skin over your port will be cleaned carefully with a germ-killing soap and allowed to dry. Your health care provider will gently pinch the port and insert a needle into it. Your health care provider will check for a blood return to make sure the port is in the vein and is not clogged. If your port needs to remain accessed to get medicine continuously (constant infusion), your health care provider will place a clear bandage (dressing) over the needle site. The dressing and needle will need to be changed every week, or as told by your health care provider. What  is flushing? Flushing helps keep the port from getting clogged. Follow instructions from your health care provider about how and when to flush the port. Ports are usually flushed with saline solution or a medicine called heparin. The need for flushing will depend on how the port is used: If the port is only used from time to time to give medicines or draw blood, the port may need to be flushed: Before and after medicines have been given. Before and after blood has been drawn. As part of routine maintenance. Flushing may be recommended every 4-6 weeks. If a constant infusion is running, the port may not need to be flushed. Throw away any syringes in a disposal container that is meant for sharp items (sharps container). You can buy a sharps container from a pharmacy, or you can make one by using an empty hard plastic bottle with a cover. How long will my port stay implanted? The port can stay in for as long as your health care provider thinks it is needed. When it is time for the port to come out, a surgery will be done to remove it. The surgery will be similar to the procedure that was done to put the port in. Follow these instructions at home:  Flush your port as told by your health care provider. If you need an infusion over several days, follow instructions from your health care provider about how   to take care of your port site. Make sure you: Wash your hands with soap and water before you change your dressing. If soap and water are not available, use alcohol-based hand sanitizer. Change your dressing as told by your health care provider. Place any used dressings or infusion bags into a plastic bag. Throw that bag in the trash. Keep the dressing that covers the needle clean and dry. Do not get it wet. Do not use scissors or sharp objects near the tube. Keep the tube clamped, unless it is being used. Check your port site every day for signs of infection. Check for: Redness, swelling, or  pain. Fluid or blood. Pus or a bad smell. Protect the skin around the port site. Avoid wearing bra straps that rub or irritate the site. Protect the skin around your port from seat belts. Place a soft pad over your chest if needed. Bathe or shower as told by your health care provider. The site may get wet as long as you are not actively receiving an infusion. Return to your normal activities as told by your health care provider. Ask your health care provider what activities are safe for you. Carry a medical alert card or wear a medical alert bracelet at all times. This will let health care providers know that you have an implanted port in case of an emergency. Get help right away if: You have redness, swelling, or pain at the port site. You have fluid or blood coming from your port site. You have pus or a bad smell coming from the port site. You have a fever. Summary Implanted ports are usually placed in the chest for long-term IV access. Follow instructions from your health care provider about flushing the port and changing bandages (dressings). Take care of the area around your port by avoiding clothing that puts pressure on the area, and by watching for signs of infection. Protect the skin around your port from seat belts. Place a soft pad over your chest if needed. Get help right away if you have a fever or you have redness, swelling, pain, drainage, or a bad smell at the port site. This information is not intended to replace advice given to you by your health care provider. Make sure you discuss any questions you have with your health care provider. Document Revised: 10/21/2020 Document Reviewed: 12/16/2019 Elsevier Patient Education  2022 [employer]  

## 2021-04-22 ENCOUNTER — Encounter: Payer: Self-pay | Admitting: Hematology and Oncology

## 2021-04-23 ENCOUNTER — Encounter: Payer: Self-pay | Admitting: *Deleted

## 2021-04-26 ENCOUNTER — Ambulatory Visit: Payer: 59

## 2021-04-26 ENCOUNTER — Telehealth: Payer: Self-pay | Admitting: *Deleted

## 2021-04-26 ENCOUNTER — Encounter: Payer: Self-pay | Admitting: *Deleted

## 2021-04-26 NOTE — Telephone Encounter (Signed)
Spoke with patient to let her know I was able to get her PET scan moved up to 9/14 at 730am with arrival at 7am. NPO after midnight and informed her to hold diabetic medication that am.  Patient verbalized understanding.

## 2021-04-28 ENCOUNTER — Encounter (HOSPITAL_COMMUNITY)
Admission: RE | Admit: 2021-04-28 | Discharge: 2021-04-28 | Disposition: A | Discharge status: Home / Self Care | Payer: 59 | Source: Ambulatory Visit | Attending: Hematology and Oncology | Admitting: Hematology and Oncology

## 2021-04-28 ENCOUNTER — Ambulatory Visit: Payer: 59 | Attending: General Surgery

## 2021-04-28 ENCOUNTER — Other Ambulatory Visit: Payer: Self-pay

## 2021-04-28 VITALS — Wt 184.0 lb

## 2021-04-28 DIAGNOSIS — Z483 Aftercare following surgery for neoplasm: Secondary | ICD-10-CM | POA: Insufficient documentation

## 2021-04-28 DIAGNOSIS — C50212 Malignant neoplasm of upper-inner quadrant of left female breast: Secondary | ICD-10-CM

## 2021-04-28 DIAGNOSIS — Z170 Estrogen receptor positive status [ER+]: Secondary | ICD-10-CM | POA: Diagnosis present

## 2021-04-28 LAB — GLUCOSE, CAPILLARY: Glucose-Capillary: 160 mg/dL — ABNORMAL HIGH (ref 70–99)

## 2021-04-28 MED ORDER — FLUDEOXYGLUCOSE F - 18 (FDG) INJECTION
9.2000 | Freq: Once | INTRAVENOUS | Status: AC | PRN
  Administered 2021-04-28: 9.13 via INTRAVENOUS

## 2021-04-28 NOTE — Therapy (Signed)
[place], [place], [address] Phone: [phone]   Fax:  [phone]  Physical Therapy Treatment  Patient Details  Name: Robin Kennedy MRN: [id number] Date of Birth: [birth year] Referring Provider (PT): Dr. [name]   Encounter Date: 04/28/2021   PT End of Session - 04/28/21 0954     Visit Number 16   # unchanged due to screen only   PT Start Time 0947    PT Stop Time 0953    PT Time Calculation (min) 6 min    Activity Tolerance Patient tolerated treatment well    Behavior During Therapy [facility] for tasks assessed/performed             Past Medical History:  Diagnosis Date   Diabetes mellitus without complication ([place])    Personal history of chemotherapy    SVD (spontaneous vaginal delivery)    X 3    Past Surgical History:  Procedure Laterality Date   BREAST BIOPSY Left 06/02/2020   x2   BREAST BIOPSY Right 06/22/2020   CYSTOSCOPY N/A 12/20/2016   Procedure: CYSTOSCOPY;  Surgeon: Lavonia Drafts, MD;  Location: [facility];  Service: Gynecology;  Laterality: N/A;   MODIFIED MASTECTOMY Left 11/09/2020   Procedure: LEFT MODIFIED MASTECTOMY WITH LYMPH NODE DISSECTION;  Surgeon: [name], MD;  Location: [facility];  Service: General;  Laterality: Left;   PORTACATH PLACEMENT Left 06/23/2020   Procedure: INSERTION PORT-A-CATH WITH ULTRASOUND GUIDANCE;  Surgeon: [name], MD;  Location: [facility];  Service: General;  Laterality: Left;   RADIOACTIVE SEED GUIDED EXCISIONAL BREAST BIOPSY Right 11/09/2020   Procedure: RIGHT BREAST SEED LOCALIZED EXCISIONAL BIOPSY;  Surgeon: [name], MD;  Location: [facility];  Service: General;  Laterality: Right;   ROBOTIC ASSISTED TOTAL HYSTERECTOMY WITH BILATERAL SALPINGO OOPHERECTOMY Bilateral 12/20/2016   Procedure: ROBOTIC ASSISTED TOTAL HYSTERECTOMY WITH BILATERAL SALPINGO OOPHORECTOMY;   Surgeon: Lavonia Drafts, MD;  Location: [facility];  Service: Gynecology;  Laterality: Bilateral;   TUBAL LIGATION     WISDOM TOOTH EXTRACTION      Vitals:   04/28/21 0950  Weight: 184 lb (83.5 kg)     Subjective Assessment - 04/28/21 0950     Subjective Pt returns for 3 month L-Dex screen. "I was in the hospital last week for chest pain and they found a swollen lymph node in my chest so I just had a PET scan before I came here."    Pertinent History Patient was diagnosed on 06/02/2020 with left grade III invasive ductal carcinoma breast cancer. It is ER positive, PR negative, and HER2 positive with a Ki67 of 60%. She underwent neoadjuvant chemotherapy 06/25/2020-10/07/2020 followed by a right lumpectomy and a left mastectomy with an axillary lymph node dissection (19 negative nodes removed) on 11/09/2020. She has type II diabetes.                    L-DEX FLOWSHEETS - 04/28/21 0900       L-DEX LYMPHEDEMA SCREENING   Measurement Type Unilateral    L-DEX MEASUREMENT EXTREMITY Upper Extremity    POSITION  Standing    DOMINANT SIDE Right    At Risk Side Left    BASELINE SCORE (UNILATERAL) 4.7    L-DEX SCORE (UNILATERAL) 7.3    VALUE CHANGE (UNILAT) 2.6  PT Long Term Goals - 03/08/21 1413       PT LONG TERM GOAL #1   Title Patient will demonstrate she has regained full shoulder ROM and function post op compared to baseline assessments.    Status Achieved      PT LONG TERM GOAL #2   Title Patient will increase left shoulder flexion AROM to >/= 160 degrees for increased ease reaching overhead.    Status Achieved      PT LONG TERM GOAL #3   Title Patient will increase left shoulder AROM abduction to >/= 160 degrees for increased ability to obtain radiation positioning.    Status Achieved      PT LONG TERM GOAL #4   Title Patient will improve her DASH score to </= 10 for improved overall UE function.     Baseline 4.55 pre-op; 75 post op; 11.83 - 01/05/21, 10% on 03/08/21    Status Achieved      PT LONG TERM GOAL #5   Title Patient will verbalize understnading of lymphedema risk reduction practices.    Baseline Pt has met this goal as she attended the ABC class and questions were answered for this at the following session later that same afternoon - 01/05/21    Status Achieved                   Plan - 04/28/21 0955     Clinical Impression Statement Pt returns for her 3 month L-Dex screen. Her change from baseline of 2.6 is WNLs so no further treament is required at this time except to cont every 3 month L-Dex screens which pt is agreeable to.    PT Next Visit Plan Cont every 3 month L-Dex screens for up to 2 years from her ALND.    Consulted and Agree with Plan of Care Patient             Patient will benefit from skilled therapeutic intervention in order to improve the following deficits and impairments:     Visit Diagnosis: Aftercare following surgery for neoplasm     Problem List Patient Active Problem List   Diagnosis Date Noted   Breast cancer metastasized to axillary lymph node, left ([place]) 11/09/2020   Port-A-Cath in place 07/02/2020   Genetic testing 06/29/2020   Malignant neoplasm of upper-inner quadrant of left breast in female, estrogen receptor positive ([place]) 06/05/2020   Post-operative state 12/20/2016   Fibroids 12/06/2016   Pelvic pain in female 12/06/2016   Diabetes mellitus ([place]) 06/19/2012    Otelia Limes, PTA 04/28/2021, 9:56 AM  [place], [place], [address] Phone: [phone]   Fax:  [phone]  Name: Dajahnae Vondra MRN: [id number] Date of Birth: [birth year]

## 2021-04-29 ENCOUNTER — Encounter: Payer: Self-pay | Admitting: *Deleted

## 2021-04-30 ENCOUNTER — Encounter: Payer: Self-pay | Admitting: Hematology and Oncology

## 2021-05-01 NOTE — Progress Notes (Signed)
HEMATOLOGY-ONCOLOGY TELEPHONE VISIT PROGRESS NOTE  I connected with Robin Kennedy on 05/03/2021 at  9:45 AM EDT by telephone and verified that I am speaking with the correct person using two identifiers.  I discussed the limitations, risks, security and privacy concerns of performing an evaluation and management service by telephone and the availability of in person appointments.  I also discussed with the patient that there may be a patient responsible charge related to this service. The patient expressed understanding and agreed to proceed.   History of Present Illness: Robin Kennedy is a 45 y.o. female with above-mentioned history of  inflammatory left breast cancer who completed neoadjuvant chemotherapy, right lumpectomy, left mastectomy, radiation therapy, and is currently on Herceptin Perjeta maintenance. PET on 04/28/2021 showed no evidence of breast cancer metastasis. She presents via telephone today for follow-up.  She is tolerating letrozole extremely well without any problems or concerns.  We reviewed the PET CT scan report over telephone.  Oncology History  Malignant neoplasm of upper-inner quadrant of left breast in female, estrogen receptor positive ([place])  06/05/2020 Initial Diagnosis   06/05/2020: Enlarging left breast with pain and diffuse skin thickening: Mammogram revealed 3.5 cm mass 11 o'clock position with 3 abnormal lymph nodes: 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 Right breast benign-appearing calcifications previous stereotactic biopsy was attempted but it was too superficial and felt to be benign.   06/10/2020 Cancer Staging   Staging form: Breast, AJCC 8th Edition - Clinical stage from 06/10/2020: Stage IIIB (cT4d, cN3c, cM0, G3, ER+, PR-, HER2+) - Signed by Eppie Gibson, MD on 12/22/2020 Stage prefix: Initial diagnosis Histologic grading system: 3 grade system Laterality: Left Staged by: Pathologist and  managing physician Stage used in treatment planning: Yes National guidelines used in treatment planning: Yes Type of national guideline used in treatment planning: NCCN   06/25/2020 - 10/09/2020 Chemotherapy   Neoadjuvant chemotherapy with [facility] Perjeta 6 cycles followed by Herceptin Perjeta maintenance for 1 year        06/29/2020 Genetic Testing   Negative genetic testing: no pathogenic variants detected in [facility] Common Hereditary Cancers Panel.  The report date is June 29, 2020.   The Common Hereditary Cancers Panel offered by [facility] includes sequencing and/or deletion duplication testing of the following 48 genes: APC, ATM, AXIN2, BARD1, BMPR1A, BRCA1, BRCA2, BRIP1, CDH1, CDK4, CDKN2A (p14ARF), CDKN2A (p16INK4a), CHEK2, CTNNA1, DICER1, EPCAM (Deletion/duplication testing only), GREM1 (promoter region deletion/duplication testing only), KIT, MEN1, MLH1, MSH2, MSH3, MSH6, MUTYH, NBN, NF1, NHTL1, PALB2, PDGFRA, PMS2, POLD1, POLE, PTEN, RAD50, RAD51C, RAD51D, RNF43, SDHB, SDHC, SDHD, SMAD4, SMARCA4. STK11, TP53, TSC1, TSC2, and VHL.  The following genes were evaluated for sequence changes only: SDHA and HOXB13 c.251G>A variant only.   10/28/2020 -  Chemotherapy      Patient is on Antibody Plan: BREAST TRASTUZUMAB + PERTUZUMAB Q21D     11/09/2020 Surgery   Right lumpectomy and left mastectomy [name]):  Right breast: no evidence of malignancy Left breast: no residual carcinoma, with 19 left axillary lymph nodes negative for carcinoma.    - 02/09/2021 Radiation Therapy   Adjuvant radiation     Observations/Objective:     Assessment Plan:  Malignant neoplasm of upper-inner quadrant of left breast in female, estrogen receptor positive ([place]) 06/05/2020: Enlarging left breast with pain and diffuse skin thickening: Mammogram revealed 3.5 cm mass 11 o'clock position with 3 abnormal lymph nodes: Biopsy revealed grade 3 IDC ER 10 to 20%, PR  0%, 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 Right  breast benign-appearing calcifications previous stereotactic biopsy was attempted but it was too superficial and felt to be benign.   Recommendation based on multidisciplinary tumor board: 1. Neoadjuvant chemotherapy with [facility] Perjeta 6 cycles followed by Herceptin Perjeta maintenance for 1 year 2. 11/09/20: Right lumpectomy and left mastectomy [name]): Right breast: no evidence of malignancy Left breast: no residual carcinoma, with 19 left axillary lymph nodes negative for carcinoma. 3. Followed by adjuvant radiation therapy (due to inflammatory breast changes at diagnosis) ------------------------------------------------------------------------------------------------------------------------------------------ 10/16/2020: CT CAP: Decrease in cutaneous thickening.  Decrease in size of left axillary, subpectoral and supraclavicular lymph nodes, no distant metastatic disease.   Treatment plan: Herceptin Perjeta maintenance, currently receiving adjuvant radiation Herceptin Perjeta toxicities: Occasional diarrhea   Emergency room visit for chest pain: CT chest revealed anterior mediastinal soft tissue nodule 1.1 cm   PET CT scan 04/28/2021: No evidence of metastatic disease.  Postsurgical changes.  We may consider doing CT scans in March 2023 to evaluate the subpectoral and supraclavicular lymph nodes.   Return to clinic every 3 weeks for Herceptin Perjeta and every 6 weeks with labs and follow-up with me.  She completes Herceptin Perjeta by 06/23/2021   Letrozole toxicities: No hot flashes or muscle pains Taking calcium and Vit D Continue with Herceptin Perjeta treatments.   I discussed the assessment and treatment plan with the patient. The patient was provided an opportunity to ask questions and all were answered. The patient agreed with the plan and demonstrated an understanding of the instructions. The patient was advised to call back or seek an in-person evaluation if the symptoms worsen or  if the condition fails to improve as anticipated.   Total time spent: 20 mins including non-face to face time and time spent for planning, charting and coordination of care  Rulon Eisenmenger, MD 05/03/2021    I, Thana Ates, am acting as scribe for Nicholas Lose, MD.  I have reviewed the above documentation for accuracy and completeness, and I agree with the above.

## 2021-05-03 ENCOUNTER — Other Ambulatory Visit: Payer: Self-pay

## 2021-05-03 ENCOUNTER — Inpatient Hospital Stay (HOSPITAL_BASED_OUTPATIENT_CLINIC_OR_DEPARTMENT_OTHER): Payer: 59 | Admitting: Hematology and Oncology

## 2021-05-03 DIAGNOSIS — Z170 Estrogen receptor positive status [ER+]: Secondary | ICD-10-CM

## 2021-05-03 DIAGNOSIS — C50212 Malignant neoplasm of upper-inner quadrant of left female breast: Secondary | ICD-10-CM

## 2021-05-03 DIAGNOSIS — Z5112 Encounter for antineoplastic immunotherapy: Secondary | ICD-10-CM | POA: Diagnosis not present

## 2021-05-03 MED ORDER — EMPAGLIFLOZIN 10 MG PO TABS: 10.0000 mg | ORAL_TABLET | Freq: Every day | ORAL | Status: AC

## 2021-05-03 NOTE — Assessment & Plan Note (Signed)
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 Right breast benign-appearing calcifications previous stereotactic biopsy was attempted but it was too superficial and felt to be benign.  Recommendationbased on multidisciplinary tumor board: 1. Neoadjuvant chemotherapy with [facility] Perjeta 6 cycles followed by Herceptin Perjeta maintenance for 1 year 2.11/09/20:Right lumpectomy and left mastectomy [name]): Right breast: no evidence of malignancy Left breast: no residual carcinoma, with 19 left axillary lymph nodes negative for carcinoma. 3. Followed by adjuvant radiation therapy(due to inflammatory breast changes at diagnosis) ------------------------------------------------------------------------------------------------------------------------------------------ 10/16/2020: CT CAP: Decrease in cutaneous thickening. Decrease in size of left axillary, subpectoral and supraclavicular lymph nodes, no distant metastatic disease.  Treatment plan: Herceptin Perjeta maintenance,currently receiving adjuvant radiation Herceptin Perjeta toxicities: Occasional diarrhea  Emergency room visit for chest pain: CT chest revealed anterior mediastinal soft tissue nodule 1.1 cm   PET CT scan 04/28/2021: No evidence of metastatic disease.  Postsurgical changes.  We may consider doing CT scans in March 2023 to evaluate the subpectoral and supraclavicular lymph nodes.  Return to clinic every 3 weeks for Herceptin Perjeta and every 6 weeks with labs and follow-up with me.  She completes Herceptin Perjeta by 06/23/2021  Letrozole toxicities:

## 2021-05-10 ENCOUNTER — Ambulatory Visit (HOSPITAL_COMMUNITY): Payer: 59

## 2021-05-12 ENCOUNTER — Inpatient Hospital Stay: Payer: 59

## 2021-05-12 ENCOUNTER — Other Ambulatory Visit: Payer: Self-pay

## 2021-05-12 VITALS — BP 122/72 | HR 72 | Temp 98.2°F | Resp 18

## 2021-05-12 DIAGNOSIS — Z95828 Presence of other vascular implants and grafts: Secondary | ICD-10-CM

## 2021-05-12 DIAGNOSIS — Z170 Estrogen receptor positive status [ER+]: Secondary | ICD-10-CM

## 2021-05-12 DIAGNOSIS — Z5112 Encounter for antineoplastic immunotherapy: Secondary | ICD-10-CM | POA: Diagnosis not present

## 2021-05-12 DIAGNOSIS — C50212 Malignant neoplasm of upper-inner quadrant of left female breast: Secondary | ICD-10-CM

## 2021-05-12 LAB — CMP (CANCER CENTER ONLY)
ALT: 10 U/L (ref 0–44)
AST: 10 U/L — ABNORMAL LOW (ref 15–41)
Albumin: 3.8 g/dL (ref 3.5–5.0)
Alkaline Phosphatase: 52 U/L (ref 38–126)
Anion gap: 10 (ref 5–15)
BUN: 25 mg/dL — ABNORMAL HIGH (ref 6–20)
CO2: 22 mmol/L (ref 22–32)
Calcium: 9.8 mg/dL (ref 8.9–10.3)
Chloride: 110 mmol/L (ref 98–111)
Creatinine: 0.84 mg/dL (ref 0.44–1.00)
GFR, Estimated: >60 mL/min (ref >60)
Glucose, Bld: 172 mg/dL — ABNORMAL HIGH (ref 70–99)
Potassium: 4 mmol/L (ref 3.5–5.1)
Sodium: 142 mmol/L (ref 135–145)
Total Bilirubin: 0.3 mg/dL (ref 0.3–1.2)
Total Protein: 7 g/dL (ref 6.5–8.1)

## 2021-05-12 LAB — CBC WITH DIFFERENTIAL (CANCER CENTER ONLY)
Abs Immature Granulocytes: 0.01 10*3/uL (ref 0.00–0.07)
Basophils Absolute: 0 10*3/uL (ref 0.0–0.1)
Basophils Relative: 0 %
Eosinophils Absolute: 0.1 10*3/uL (ref 0.0–0.5)
Eosinophils Relative: 2 %
HCT: 32.6 % — ABNORMAL LOW (ref 36.0–46.0)
Hemoglobin: 10.3 g/dL — ABNORMAL LOW (ref 12.0–15.0)
Immature Granulocytes: 0 %
Lymphocytes Relative: 32 %
Lymphs Abs: 1.4 10*3/uL (ref 0.7–4.0)
MCH: 26.9 pg (ref 26.0–34.0)
MCHC: 31.6 g/dL (ref 30.0–36.0)
MCV: 85.1 fL (ref 80.0–100.0)
Monocytes Absolute: 0.3 10*3/uL (ref 0.1–1.0)
Monocytes Relative: 7 %
Neutro Abs: 2.6 10*3/uL (ref 1.7–7.7)
Neutrophils Relative %: 59 %
Platelet Count: 231 10*3/uL (ref 150–400)
RBC: 3.83 MIL/uL — ABNORMAL LOW (ref 3.87–5.11)
RDW: 14.3 % (ref 11.5–15.5)
WBC Count: 4.3 10*3/uL (ref 4.0–10.5)
nRBC: 0 % (ref 0.0–0.2)

## 2021-05-12 MED ORDER — DIPHENHYDRAMINE HCL 25 MG PO CAPS
50.0000 mg | ORAL_CAPSULE | Freq: Once | ORAL | Status: AC
  Administered 2021-05-12: 50 mg via ORAL
  Filled 2021-05-12: qty 2

## 2021-05-12 MED ORDER — SODIUM CHLORIDE 0.9% FLUSH
10.0000 mL | Freq: Once | INTRAVENOUS | Status: AC
  Administered 2021-05-12: 10 mL

## 2021-05-12 MED ORDER — SODIUM CHLORIDE 0.9 % IV SOLN: Freq: Once | INTRAVENOUS | Status: AC

## 2021-05-12 MED ORDER — TRASTUZUMAB-DKST CHEMO 150 MG IV SOLR
6.0000 mg/kg | Freq: Once | INTRAVENOUS | Status: AC
  Administered 2021-05-12: 525 mg via INTRAVENOUS
  Filled 2021-05-12: qty 25

## 2021-05-12 MED ORDER — PERTUZUMAB CHEMO INJECTION 420 MG/14ML
420.0000 mg | Freq: Once | INTRAVENOUS | Status: AC
  Administered 2021-05-12: 420 mg via INTRAVENOUS
  Filled 2021-05-12: qty 14

## 2021-05-12 MED ORDER — ACETAMINOPHEN 325 MG PO TABS
650.0000 mg | ORAL_TABLET | Freq: Once | ORAL | Status: AC
  Administered 2021-05-12: 650 mg via ORAL
  Filled 2021-05-12: qty 2

## 2021-05-12 NOTE — Progress Notes (Signed)
Patient waited 30 minute post observation for her perjeta with no issues

## 2021-06-01 NOTE — Progress Notes (Signed)
Patient Care Team: Lavonia Drafts, MD as PCP - General (Obstetrics and Gynecology) Shawnee Knapp, MD (Family Medicine) Mauro Kaufmann, RN as Oncology Nurse Navigator Rockwell Germany, RN as Oncology Nurse Navigator [name], MD as Consulting Physician (General Surgery) Nicholas Lose, MD as Consulting Physician (Hematology and Oncology) Eppie Gibson, MD as Attending Physician (Radiation Oncology)  DIAGNOSIS:    ICD-10-CM   1. Malignant neoplasm of upper-inner quadrant of left breast in female, estrogen receptor positive ([place])  C50.212    Z17.0       SUMMARY OF ONCOLOGIC HISTORY: Oncology History  Malignant neoplasm of upper-inner quadrant of left breast in female, estrogen receptor positive ([place])  06/05/2020 Initial Diagnosis   06/05/2020: Enlarging left breast with pain and diffuse skin thickening: Mammogram revealed 3.5 cm mass 11 o'clock position with 3 abnormal lymph nodes: 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 Right breast benign-appearing calcifications previous stereotactic biopsy was attempted but it was too superficial and felt to be benign.   06/10/2020 Cancer Staging   Staging form: Breast, AJCC 8th Edition - Clinical stage from 06/10/2020: Stage IIIB (cT4d, cN3c, cM0, G3, ER+, PR-, HER2+) - Signed by Eppie Gibson, MD on 12/22/2020 Stage prefix: Initial diagnosis Histologic grading system: 3 grade system Laterality: Left Staged by: Pathologist and managing physician Stage used in treatment planning: Yes National guidelines used in treatment planning: Yes Type of national guideline used in treatment planning: NCCN   06/25/2020 - 10/09/2020 Chemotherapy   Neoadjuvant chemotherapy with [facility] Perjeta 6 cycles followed by Herceptin Perjeta maintenance for 1 year        06/29/2020 Genetic Testing   Negative genetic testing: no pathogenic variants detected in [facility] Common Hereditary Cancers Panel.  The report  date is June 29, 2020.   The Common Hereditary Cancers Panel offered by [facility] includes sequencing and/or deletion duplication testing of the following 48 genes: APC, ATM, AXIN2, BARD1, BMPR1A, BRCA1, BRCA2, BRIP1, CDH1, CDK4, CDKN2A (p14ARF), CDKN2A (p16INK4a), CHEK2, CTNNA1, DICER1, EPCAM (Deletion/duplication testing only), GREM1 (promoter region deletion/duplication testing only), KIT, MEN1, MLH1, MSH2, MSH3, MSH6, MUTYH, NBN, NF1, NHTL1, PALB2, PDGFRA, PMS2, POLD1, POLE, PTEN, RAD50, RAD51C, RAD51D, RNF43, SDHB, SDHC, SDHD, SMAD4, SMARCA4. STK11, TP53, TSC1, TSC2, and VHL.  The following genes were evaluated for sequence changes only: SDHA and HOXB13 c.251G>A variant only.   10/28/2020 -  Chemotherapy   Patient is on Treatment Plan : BREAST Trastuzumab + Pertuzumab q21d     11/09/2020 Surgery   Right lumpectomy and left mastectomy [name]):  Right breast: no evidence of malignancy Left breast: no residual carcinoma, with 19 left axillary lymph nodes negative for carcinoma.    - 02/09/2021 Radiation Therapy   Adjuvant radiation     CHIEF COMPLIANT: Cycle 11 Herceptin Perjeta  INTERVAL HISTORY: Robin Kennedy is a 45 y.o. with above-mentioned history of  inflammatory left breast cancer who completed neoadjuvant chemotherapy, right lumpectomy, left mastectomy, radiation therapy, and is currently on Herceptin Perjeta maintenance. She presents to the clinic today for follow-up.   ALLERGIES:  has No Known Allergies.  MEDICATIONS:  Current Outpatient Medications  Medication Sig Dispense Refill   empagliflozin (JARDIANCE) 10 MG TABS tablet Take 1 tablet (10 mg total) by mouth daily before breakfast. 30 tablet    glucose blood (ONE TOUCH ULTRA TEST) test strip Use to check cbgs qd 100 each 11   letrozole (FEMARA) 2.5 MG tablet Take 1 tablet (2.5 mg total) by  mouth daily. 90 tablet 3   lidocaine-prilocaine (EMLA) cream Apply 1 application topically as needed. 30 g 1   TRESIBA  FLEXTOUCH 100 UNIT/ML FlexTouch Pen Inject 10 Units into the skin daily after breakfast.     No current facility-administered medications for this visit.    PHYSICAL EXAMINATION: ECOG PERFORMANCE STATUS: 1 - Symptomatic but completely ambulatory  Vitals:   06/02/21 0835  BP: 109/70  Pulse: 94  Resp: 18  Temp: 97.8 F (36.6 C)  SpO2: 99%   Filed Weights   06/02/21 0835  Weight: 190 lb 1.6 oz (86.2 kg)    LABORATORY DATA:  I have reviewed the data as listed CMP Latest Ref Rng & Units 05/12/2021 04/21/2021 04/15/2021  Glucose 70 - 99 mg/dL 172(H) 245(H) 226(H)  BUN 6 - 20 mg/dL 25(H) 17 15  Creatinine 0.44 - 1.00 mg/dL 0.84 0.86 0.75  Sodium 135 - 145 mmol/L 142 141 137  Potassium 3.5 - 5.1 mmol/L 4.0 3.7 3.6  Chloride 98 - 111 mmol/L 110 108 103  CO2 22 - 32 mmol/L 22 21(L) 26  Calcium 8.9 - 10.3 mg/dL 9.8 9.6 9.3  Total Protein 6.5 - 8.1 g/dL 7.0 6.8 -  Total Bilirubin 0.3 - 1.2 mg/dL 0.3 0.4 -  Alkaline Phos 38 - 126 U/L 52 54 -  AST 15 - 41 U/L 10(L) 7(L) -  ALT 0 - 44 U/L 10 <6 -    Lab Results  Component Value Date   WBC 4.8 06/02/2021   HGB 10.1 (L) 06/02/2021   HCT 32.5 (L) 06/02/2021   MCV 84.6 06/02/2021   PLT 216 06/02/2021   NEUTROABS 2.9 06/02/2021    ASSESSMENT & PLAN:  Malignant neoplasm of upper-inner quadrant of left breast in female, estrogen receptor positive ([place]) 06/05/2020: Enlarging left breast with pain and diffuse skin thickening: Mammogram revealed 3.5 cm mass 11 o'clock position with 3 abnormal lymph nodes: 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 Right breast benign-appearing calcifications previous stereotactic biopsy was attempted but it was too superficial and felt to be benign.   Recommendation based on multidisciplinary tumor board: 1. Neoadjuvant chemotherapy with [facility] Perjeta 6 cycles followed by Herceptin Perjeta maintenance for 1 year 2. 11/09/20: Right lumpectomy and left mastectomy  [name]): Right breast: no evidence of malignancy Left breast: no residual carcinoma, with 19 left axillary lymph nodes negative for carcinoma. 3. Followed by adjuvant radiation therapy (due to inflammatory breast changes at diagnosis) ------------------------------------------------------------------------------------------------------------------------------------------ 10/16/2020: CT CAP: Decrease in cutaneous thickening.  Decrease in size of left axillary, subpectoral and supraclavicular lymph nodes, no distant metastatic disease.   Treatment plan: Herceptin Perjeta maintenance, currently receiving adjuvant radiation Herceptin Perjeta toxicities: Occasional diarrhea   Emergency room visit for chest pain: CT chest revealed anterior mediastinal soft tissue nodule 1.1 cm   PET CT scan 04/28/2021: No evidence of metastatic disease.  Postsurgical changes.   We may consider doing CT scans in March 2023 to evaluate the subpectoral and supraclavicular lymph nodes.   She completes Herceptin Perjeta by 06/23/2021   Letrozole toxicities: No hot flashes or muscle pains Taking calcium and Vit D Breast cancer surveillance: Mammograms will arrange for February 2023. We discussed the importance of physical activity and exercise as well as maintaining appropriate weight and controlling her diabetes as a key factors and maintaining her health. Return to clinic in 6 months for follow-up and after that we can see her once a year  No orders of the defined types were placed in this encounter.  The patient has a good understanding of the overall plan. she agrees with it. she will call with any problems that may develop before the next visit here.  Total time spent: 30 mins including face to face time and time spent for planning, charting and coordination of care  Rulon Eisenmenger, MD, MPH 06/02/2021  I, Thana Ates, am acting as scribe for Dr. Nicholas Lose.  I have reviewed the above documentation for  accuracy and completeness, and I agree with the above.

## 2021-06-02 ENCOUNTER — Inpatient Hospital Stay: Payer: 59 | Attending: Hematology and Oncology

## 2021-06-02 ENCOUNTER — Ambulatory Visit: Payer: 59

## 2021-06-02 ENCOUNTER — Inpatient Hospital Stay: Payer: 59

## 2021-06-02 ENCOUNTER — Inpatient Hospital Stay (HOSPITAL_BASED_OUTPATIENT_CLINIC_OR_DEPARTMENT_OTHER): Payer: 59 | Admitting: Hematology and Oncology

## 2021-06-02 ENCOUNTER — Other Ambulatory Visit: Payer: Self-pay

## 2021-06-02 DIAGNOSIS — Z170 Estrogen receptor positive status [ER+]: Secondary | ICD-10-CM | POA: Diagnosis present

## 2021-06-02 DIAGNOSIS — Z95828 Presence of other vascular implants and grafts: Secondary | ICD-10-CM

## 2021-06-02 DIAGNOSIS — Z5112 Encounter for antineoplastic immunotherapy: Secondary | ICD-10-CM | POA: Diagnosis not present

## 2021-06-02 DIAGNOSIS — C50212 Malignant neoplasm of upper-inner quadrant of left female breast: Secondary | ICD-10-CM | POA: Insufficient documentation

## 2021-06-02 DIAGNOSIS — Z9013 Acquired absence of bilateral breasts and nipples: Secondary | ICD-10-CM | POA: Diagnosis not present

## 2021-06-02 DIAGNOSIS — Z79899 Other long term (current) drug therapy: Secondary | ICD-10-CM | POA: Diagnosis not present

## 2021-06-02 DIAGNOSIS — Z9221 Personal history of antineoplastic chemotherapy: Secondary | ICD-10-CM | POA: Diagnosis not present

## 2021-06-02 DIAGNOSIS — Z923 Personal history of irradiation: Secondary | ICD-10-CM | POA: Diagnosis not present

## 2021-06-02 LAB — CMP (CANCER CENTER ONLY)
ALT: 12 U/L (ref 0–44)
AST: 11 U/L — ABNORMAL LOW (ref 15–41)
Albumin: 3.6 g/dL (ref 3.5–5.0)
Alkaline Phosphatase: 52 U/L (ref 38–126)
Anion gap: 9 (ref 5–15)
BUN: 20 mg/dL (ref 6–20)
CO2: 23 mmol/L (ref 22–32)
Calcium: 9.6 mg/dL (ref 8.9–10.3)
Chloride: 110 mmol/L (ref 98–111)
Creatinine: 0.8 mg/dL (ref 0.44–1.00)
GFR, Estimated: >60 mL/min (ref >60)
Glucose, Bld: 195 mg/dL — ABNORMAL HIGH (ref 70–99)
Potassium: 3.8 mmol/L (ref 3.5–5.1)
Sodium: 142 mmol/L (ref 135–145)
Total Bilirubin: 0.2 mg/dL — ABNORMAL LOW (ref 0.3–1.2)
Total Protein: 6.6 g/dL (ref 6.5–8.1)

## 2021-06-02 LAB — CBC WITH DIFFERENTIAL (CANCER CENTER ONLY)
Abs Immature Granulocytes: 0 10*3/uL (ref 0.00–0.07)
Basophils Absolute: 0 10*3/uL (ref 0.0–0.1)
Basophils Relative: 0 %
Eosinophils Absolute: 0.1 10*3/uL (ref 0.0–0.5)
Eosinophils Relative: 2 %
HCT: 32.5 % — ABNORMAL LOW (ref 36.0–46.0)
Hemoglobin: 10.1 g/dL — ABNORMAL LOW (ref 12.0–15.0)
Immature Granulocytes: 0 %
Lymphocytes Relative: 30 %
Lymphs Abs: 1.4 10*3/uL (ref 0.7–4.0)
MCH: 26.3 pg (ref 26.0–34.0)
MCHC: 31.1 g/dL (ref 30.0–36.0)
MCV: 84.6 fL (ref 80.0–100.0)
Monocytes Absolute: 0.4 10*3/uL (ref 0.1–1.0)
Monocytes Relative: 8 %
Neutro Abs: 2.9 10*3/uL (ref 1.7–7.7)
Neutrophils Relative %: 60 %
Platelet Count: 216 10*3/uL (ref 150–400)
RBC: 3.84 MIL/uL — ABNORMAL LOW (ref 3.87–5.11)
RDW: 13.8 % (ref 11.5–15.5)
WBC Count: 4.8 10*3/uL (ref 4.0–10.5)
nRBC: 0 % (ref 0.0–0.2)

## 2021-06-02 MED ORDER — ACETAMINOPHEN 325 MG PO TABS
650.0000 mg | ORAL_TABLET | Freq: Once | ORAL | Status: AC
  Administered 2021-06-02: 650 mg via ORAL
  Filled 2021-06-02: qty 2

## 2021-06-02 MED ORDER — SODIUM CHLORIDE 0.9% FLUSH
10.0000 mL | INTRAVENOUS | Status: DC | PRN
  Administered 2021-06-02: 10 mL

## 2021-06-02 MED ORDER — SODIUM CHLORIDE 0.9 % IV SOLN: Freq: Once | INTRAVENOUS | Status: AC

## 2021-06-02 MED ORDER — SODIUM CHLORIDE 0.9% FLUSH
10.0000 mL | Freq: Once | INTRAVENOUS | Status: AC
  Administered 2021-06-02: 10 mL

## 2021-06-02 MED ORDER — SODIUM CHLORIDE 0.9 % IV SOLN
420.0000 mg | Freq: Once | INTRAVENOUS | Status: AC
  Administered 2021-06-02: 420 mg via INTRAVENOUS
  Filled 2021-06-02: qty 14

## 2021-06-02 MED ORDER — DIPHENHYDRAMINE HCL 25 MG PO CAPS
50.0000 mg | ORAL_CAPSULE | Freq: Once | ORAL | Status: AC
  Administered 2021-06-02: 50 mg via ORAL
  Filled 2021-06-02: qty 2

## 2021-06-02 MED ORDER — TRASTUZUMAB-DKST CHEMO 150 MG IV SOLR
6.0000 mg/kg | Freq: Once | INTRAVENOUS | Status: AC
  Administered 2021-06-02: 525 mg via INTRAVENOUS
  Filled 2021-06-02: qty 25

## 2021-06-02 MED ORDER — HEPARIN SOD (PORK) LOCK FLUSH 100 UNIT/ML IV SOLN
500.0000 [IU] | Freq: Once | INTRAVENOUS | Status: AC | PRN
  Administered 2021-06-02: 500 [IU]

## 2021-06-02 NOTE — Assessment & Plan Note (Addendum)
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 Right breast benign-appearing calcifications previous stereotactic biopsy was attempted but it was too superficial and felt to be benign.  Recommendationbased on multidisciplinary tumor board: 1. Neoadjuvant chemotherapy with [facility] Perjeta 6 cycles followed by Herceptin Perjeta maintenance for 1 year 2.11/09/20:Right lumpectomy and left mastectomy [name]): Right breast: no evidence of malignancy Left breast: no residual carcinoma, with 19 left axillary lymph nodes negative for carcinoma. 3. Followed by adjuvant radiation therapy(due to inflammatory breast changes at diagnosis) ------------------------------------------------------------------------------------------------------------------------------------------ 10/16/2020: CT CAP: Decrease in cutaneous thickening. Decrease in size of left axillary, subpectoral and supraclavicular lymph nodes, no distant metastatic disease.  Treatment plan: Herceptin Perjeta maintenance,currently receiving adjuvant radiation Herceptin Perjeta toxicities: Occasional diarrhea  Emergency room visit for chest pain: CT chest revealed anterior mediastinal soft tissue nodule 1.1 cm   PET CT scan 04/28/2021: No evidence of metastatic disease.  Postsurgical changes.  We may consider doing CT scans in March 2023 to evaluate the subpectoral and supraclavicular lymph nodes.  She completes Herceptin Perjeta by 06/23/2021  Letrozole toxicities: No hot flashes or muscle pains Taking calcium and Vit D Breast cancer surveillance: Mammograms will arrange for February 2023. Return to clinic in 6 months for follow-up and after that we can see her once a year

## 2021-06-02 NOTE — Patient Instructions (Signed)
[facility]COL[facility]  Discharge Instructions: Thank you for choosing [facility] to provide your oncology and hematology care.   If you have a lab appointment with the [facility], please go directly to the [facility] and check in at the registration area.   Wear comfortable clothing and clothing appropriate for easy access to any Portacath or PICC line.   We strive to give you quality time with your provider. You may need to reschedule your appointment if you arrive late (15 or more minutes).  Arriving late affects you and other patients whose appointments are after yours.  Also, if you miss three or more appointments without notifying the office, you may be dismissed from the clinic at the provider's discretion.      For prescription refill requests, have your pharmacy contact our office and allow 72 hours for refills to be completed.    Today you received the following chemotherapy and/or immunotherapy agents Herceptin and Perjeta      To help prevent nausea and vomiting after your treatment, we encourage you to take your nausea medication as directed.  BELOW ARE SYMPTOMS THAT SHOULD BE REPORTED IMMEDIATELY: *FEVER GREATER THAN 100.4 F (38 C) OR HIGHER *CHILLS OR SWEATING *NAUSEA AND VOMITING THAT IS NOT CONTROLLED WITH YOUR NAUSEA MEDICATION *UNUSUAL SHORTNESS OF BREATH *UNUSUAL BRUISING OR BLEEDING *URINARY PROBLEMS (pain or burning when urinating, or frequent urination) *BOWEL PROBLEMS (unusual diarrhea, constipation, pain near the anus) TENDERNESS IN MOUTH AND THROAT WITH OR WITHOUT PRESENCE OF ULCERS (sore throat, sores in mouth, or a toothache) UNUSUAL RASH, SWELLING OR PAIN  UNUSUAL VAGINAL DISCHARGE OR ITCHING   Items with * indicate a potential emergency and should be followed up as soon as possible or go to the Emergency Department if any problems should occur.  Please show the CHEMOTHERAPY ALERT CARD or IMMUNOTHERAPY ALERT CARD at  check-in to the Emergency Department and triage nurse.  Should you have questions after your visit or need to cancel or reschedule your appointment, please contact [facility]  Dept: [phone]  and follow the prompts.  Office hours are 8:00 a.m. to 4:30 p.m. Monday - Friday. Please note that voicemails left after 4:00 p.m. may not be returned until the following business day.  We are closed weekends and major holidays. You have access to a nurse at all times for urgent questions. Please call the main number to the clinic Dept: [phone] and follow the prompts.   For any non-urgent questions, you may also contact your provider using MyChart. We now offer e-Visits for anyone 46 and older to request care online for non-urgent symptoms. For details visit [email or web address].   Also download the MyChart app! Go to the app store, search "MyChart", open the app, select [facility], and log in with your MyChart username and password.  Due to Covid, a mask is required upon entering the hospital/clinic. If you do not have a mask, one will be given to you upon arrival. For doctor visits, patients may have 1 support person aged 62 or older with them. For treatment visits, patients cannot have anyone with them due to current Covid guidelines and our immunocompromised population.

## 2021-06-04 ENCOUNTER — Other Ambulatory Visit: Payer: Self-pay | Admitting: General Surgery

## 2021-06-22 ENCOUNTER — Other Ambulatory Visit: Payer: Self-pay

## 2021-06-22 ENCOUNTER — Encounter (HOSPITAL_COMMUNITY): Payer: Self-pay | Admitting: General Surgery

## 2021-06-22 NOTE — Progress Notes (Signed)
Robin Kennedy denies chest pain or shortness of  breath.  Patient denies having any s/s of Covid in her household.  Patient denies any known exposure to Covid.    Robin Kennedy has type II diabetes, patient reports that [place] run 150.  I instructed Patient to hold Jardiance Wednesday and Thursday. I instructed Robin Kennedy that if CBG is greater than 70 to take 9 units of Tresiba. I instructed patient to check CBG after awaking and every 2 hours until arrival  to the hospital.  I Instructed patient if CBG is less than 70 to take 4 Glucose Tablets or 1 tube of Glucose Gel or 1/2 cup of a clear juice. Recheck CBG in 15 minutes if CBG is not over 70 call, pre- op desk at [phone] for further instructions. If scheduled to receive Insulin, do not take Insulin

## 2021-06-23 ENCOUNTER — Encounter: Payer: Self-pay | Admitting: *Deleted

## 2021-06-23 ENCOUNTER — Encounter (HOSPITAL_COMMUNITY): Payer: Self-pay | Admitting: General Surgery

## 2021-06-23 ENCOUNTER — Inpatient Hospital Stay: Payer: 59

## 2021-06-23 ENCOUNTER — Inpatient Hospital Stay: Payer: 59 | Attending: Hematology and Oncology

## 2021-06-23 VITALS — BP 113/79 | HR 85 | Temp 98.0°F | Resp 15 | Wt 188.5 lb

## 2021-06-23 DIAGNOSIS — C50212 Malignant neoplasm of upper-inner quadrant of left female breast: Secondary | ICD-10-CM | POA: Insufficient documentation

## 2021-06-23 DIAGNOSIS — Z79899 Other long term (current) drug therapy: Secondary | ICD-10-CM | POA: Insufficient documentation

## 2021-06-23 DIAGNOSIS — Z95828 Presence of other vascular implants and grafts: Secondary | ICD-10-CM

## 2021-06-23 DIAGNOSIS — Z170 Estrogen receptor positive status [ER+]: Secondary | ICD-10-CM | POA: Insufficient documentation

## 2021-06-23 DIAGNOSIS — Z9013 Acquired absence of bilateral breasts and nipples: Secondary | ICD-10-CM | POA: Insufficient documentation

## 2021-06-23 DIAGNOSIS — Z923 Personal history of irradiation: Secondary | ICD-10-CM | POA: Diagnosis not present

## 2021-06-23 DIAGNOSIS — Z5112 Encounter for antineoplastic immunotherapy: Secondary | ICD-10-CM | POA: Insufficient documentation

## 2021-06-23 DIAGNOSIS — Z9221 Personal history of antineoplastic chemotherapy: Secondary | ICD-10-CM | POA: Insufficient documentation

## 2021-06-23 LAB — CMP (CANCER CENTER ONLY)
ALT: 10 U/L (ref 0–44)
AST: 9 U/L — ABNORMAL LOW (ref 15–41)
Albumin: 3.7 g/dL (ref 3.5–5.0)
Alkaline Phosphatase: 48 U/L (ref 38–126)
Anion gap: 10 (ref 5–15)
BUN: 22 mg/dL — ABNORMAL HIGH (ref 6–20)
CO2: 23 mmol/L (ref 22–32)
Calcium: 9.5 mg/dL (ref 8.9–10.3)
Chloride: 109 mmol/L (ref 98–111)
Creatinine: 0.98 mg/dL (ref 0.44–1.00)
GFR, Estimated: >60 mL/min (ref >60)
Glucose, Bld: 193 mg/dL — ABNORMAL HIGH (ref 70–99)
Potassium: 3.9 mmol/L (ref 3.5–5.1)
Sodium: 142 mmol/L (ref 135–145)
Total Bilirubin: 0.3 mg/dL (ref 0.3–1.2)
Total Protein: 6.9 g/dL (ref 6.5–8.1)

## 2021-06-23 LAB — CBC WITH DIFFERENTIAL (CANCER CENTER ONLY)
Abs Immature Granulocytes: 0.02 10*3/uL (ref 0.00–0.07)
Basophils Absolute: 0 10*3/uL (ref 0.0–0.1)
Basophils Relative: 0 %
Eosinophils Absolute: 0.1 10*3/uL (ref 0.0–0.5)
Eosinophils Relative: 2 %
HCT: 32.8 % — ABNORMAL LOW (ref 36.0–46.0)
Hemoglobin: 10.2 g/dL — ABNORMAL LOW (ref 12.0–15.0)
Immature Granulocytes: 0 %
Lymphocytes Relative: 28 %
Lymphs Abs: 1.6 10*3/uL (ref 0.7–4.0)
MCH: 26.1 pg (ref 26.0–34.0)
MCHC: 31.1 g/dL (ref 30.0–36.0)
MCV: 83.9 fL (ref 80.0–100.0)
Monocytes Absolute: 0.5 10*3/uL (ref 0.1–1.0)
Monocytes Relative: 8 %
Neutro Abs: 3.6 10*3/uL (ref 1.7–7.7)
Neutrophils Relative %: 62 %
Platelet Count: 244 10*3/uL (ref 150–400)
RBC: 3.91 MIL/uL (ref 3.87–5.11)
RDW: 14.1 % (ref 11.5–15.5)
WBC Count: 5.8 10*3/uL (ref 4.0–10.5)
nRBC: 0 % (ref 0.0–0.2)

## 2021-06-23 MED ORDER — SODIUM CHLORIDE 0.9 % IV SOLN
420.0000 mg | Freq: Once | INTRAVENOUS | Status: AC
  Administered 2021-06-23: 420 mg via INTRAVENOUS
  Filled 2021-06-23: qty 14

## 2021-06-23 MED ORDER — SODIUM CHLORIDE 0.9% FLUSH
10.0000 mL | Freq: Once | INTRAVENOUS | Status: AC
  Administered 2021-06-23: 10 mL

## 2021-06-23 MED ORDER — ACETAMINOPHEN 325 MG PO TABS
650.0000 mg | ORAL_TABLET | Freq: Once | ORAL | Status: AC
  Administered 2021-06-23: 650 mg via ORAL
  Filled 2021-06-23: qty 2

## 2021-06-23 MED ORDER — DIPHENHYDRAMINE HCL 25 MG PO CAPS
50.0000 mg | ORAL_CAPSULE | Freq: Once | ORAL | Status: AC
  Administered 2021-06-23: 50 mg via ORAL
  Filled 2021-06-23: qty 2

## 2021-06-23 MED ORDER — TRASTUZUMAB-DKST CHEMO 150 MG IV SOLR
6.0000 mg/kg | Freq: Once | INTRAVENOUS | Status: AC
  Administered 2021-06-23: 525 mg via INTRAVENOUS
  Filled 2021-06-23: qty 25

## 2021-06-23 MED ORDER — SODIUM CHLORIDE 0.9 % IV SOLN
Freq: Once | INTRAVENOUS | Status: AC
Start: 2021-06-23 — End: 2021-06-23

## 2021-06-23 MED ORDER — SODIUM CHLORIDE 0.9% FLUSH
10.0000 mL | INTRAVENOUS | Status: DC | PRN
  Administered 2021-06-23: 10 mL

## 2021-06-23 MED ORDER — HEPARIN SOD (PORK) LOCK FLUSH 100 UNIT/ML IV SOLN
500.0000 [IU] | Freq: Once | INTRAVENOUS | Status: AC | PRN
  Administered 2021-06-23: 500 [IU]

## 2021-06-23 NOTE — Progress Notes (Signed)
Anesthesia Chart Review: Robin Kennedy  Case: [id number] Date/Time: 06/24/21 1200   Procedure: REMOVAL PORT-A-CATH   Anesthesia type: Monitor Anesthesia Care   Pre-op diagnosis: PORT IN PLACE   Location: [facility] OR ROOM 02 / [facility]   Surgeons: [name], MD       DISCUSSION: Patient is a 45 year old female scheduled for the above procedure.   History includes never smoker, DM2, left breast cancer (diagnosed 06/05/20; chemotherapy 06/25/20-10/09/20, s/p left modified radical mastectomy with axillary lymph node dissection, right seed localized excisional biopsy 11/09/20, s/p chemoradiation), left [place] (06/23/20), hysterectomy/BSO (12/20/16).   She received trastuzumab (Ogivri) 525 mg and pertuzumab (Perjeta) 420 mg on 06/23/21 for maintenance therapy. She had CBC and CMET prior to infusions. Per oncologist Dr. Geralyn Flash 06/02/21 note, she completes trastuzumab and pertuzumab by 06/23/21. Six month follow-up planned.    Anesthesia team to evaluate on the day of surgery.    VS: Ht 5\' 8"  (1.727 m)   Wt 83.9 kg   LMP 11/06/2016 (Exact Date)   BMI 28.13 kg/m  BP Readings from Last 3 Encounters:  06/23/21 113/79  06/02/21 109/70  05/12/21 122/72   Pulse Readings from Last 3 Encounters:  06/23/21 85  06/02/21 94  05/12/21 72     PROVIDERS: Lavonia Drafts, MD is GYN Nicholas Lose, MD is HEM-ONC Eppie Gibson, MD is RAD-ONC.  She completed left breast radiation on 02/09/2021.  As needed follow-up at 03/24/21 visit.  Jacelyn Pi, MD is endocrinologist   LABS: 06/23/21 lab results prior to Ogivri and pertuzumab infusion included: Lab Results  Component Value Date   WBC 5.8 06/23/2021   HGB 10.2 (L) 06/23/2021   HCT 32.8 (L) 06/23/2021   PLT 244 06/23/2021   GLUCOSE 193 (H) 06/23/2021   ALT 10 06/23/2021   AST 9 (L) 06/23/2021   NA 142 06/23/2021   K 3.9 06/23/2021   CL 109 06/23/2021   CREATININE 0.98 06/23/2021   BUN 22 (H) 06/23/2021   CO2 23 06/23/2021      IMAGES: PET Scan 04/28/21: IMPRESSION: 1. No evidence of breast cancer metastasis on  FDG PET/CT  imaging. 2. Post surgical and radiation change in the LEFT chest wall and LEFT upper lobe.   CT Head 04/15/21: IMPRESSION: Normal CT head.   CTA Chest 04/15/21: IMPRESSION: 1. No CT evidence for acute pulmonary embolus. 2. Patchy airspace disease in the left lung apex with bandlike consolidative opacity in the anterior left upper lobe. Sagittal imaging suggests that this is the same process that tracks from the anterior left upper lobe into the apex, likely post radiation change. Superimposed infection can not be excluded by imaging. 3. Amorphous soft tissue in the anterior mediastinum is new with mildly enlarged subcarinal node on today's study. Close attention on follow-up recommended. PET-CT could be used to further evaluate if there is clinical concern for metastatic disease.    EKG: 04/15/21: Normal sinus rhythm with sinus arrhythmia Low voltage QRS Septal infarct , age undetermined Abnormal ECG No acute changes Confirmed by Addison Lank [phone]) on 04/15/2021 5:40:31 AM   CV: Echo 03/24/21: IMPRESSIONS   1. Left ventricular ejection fraction, by estimation, is 60 to 65%. The  left ventricle has normal function. The left ventricle has no regional  wall motion abnormalities. Left ventricular diastolic parameters are  consistent with Grade I diastolic  dysfunction (impaired relaxation). The average left ventricular global  longitudinal strain is -20.5 %. The global longitudinal strain is normal.  2. Right ventricular systolic function is normal. The right ventricular  size is normal. There is normal pulmonary artery systolic pressure.   3. The mitral valve is normal in structure. Trivial mitral valve  regurgitation. No evidence of mitral stenosis.   4. The aortic valve is normal in structure. Aortic valve regurgitation is  not visualized. No aortic stenosis is present.    5. The inferior vena cava is normal in size with greater than 50%  respiratory variability, suggesting right atrial pressure of 3 mmHg.  - Comparison(s): No change since prior. GLS -19.2 >> -19.3.    Past Medical History:  Diagnosis Date   Cancer ([facility]) 06/05/2020   left breast   Diabetes mellitus without complication ([place])    Personal history of chemotherapy    SVD (spontaneous vaginal delivery)    X 3    Past Surgical History:  Procedure Laterality Date   BREAST BIOPSY Left 06/02/2020   x2   BREAST BIOPSY Right 06/22/2020   CYSTOSCOPY N/A 12/20/2016   Procedure: CYSTOSCOPY;  Surgeon: Lavonia Drafts, MD;  Location: [facility];  Service: Gynecology;  Laterality: N/A;   MODIFIED MASTECTOMY Left 11/09/2020   Procedure: LEFT MODIFIED MASTECTOMY WITH LYMPH NODE DISSECTION;  Surgeon: [name], MD;  Location: [facility];  Service: General;  Laterality: Left;   PORTACATH PLACEMENT Left 06/23/2020   Procedure: INSERTION PORT-A-CATH WITH ULTRASOUND GUIDANCE;  Surgeon: [name], MD;  Location: [facility];  Service: General;  Laterality: Left;   RADIOACTIVE SEED GUIDED EXCISIONAL BREAST BIOPSY Right 11/09/2020   Procedure: RIGHT BREAST SEED LOCALIZED EXCISIONAL BIOPSY;  Surgeon: [name], MD;  Location: [facility];  Service: General;  Laterality: Right;   ROBOTIC ASSISTED TOTAL HYSTERECTOMY WITH BILATERAL SALPINGO OOPHERECTOMY Bilateral 12/20/2016   Procedure: ROBOTIC ASSISTED TOTAL HYSTERECTOMY WITH BILATERAL SALPINGO OOPHORECTOMY;  Surgeon: Lavonia Drafts, MD;  Location: [facility];  Service: Gynecology;  Laterality: Bilateral;   TUBAL LIGATION     WISDOM TOOTH EXTRACTION      MEDICATIONS: No current facility-administered medications for this encounter.    calcium-vitamin D (OSCAL WITH D) 500MG -200UNIT (5MCG) tablet   empagliflozin (JARDIANCE) 10 MG TABS tablet   letrozole (FEMARA) 2.5 MG tablet   lidocaine-prilocaine (EMLA)  cream   TRESIBA FLEXTOUCH 100 UNIT/ML FlexTouch Pen   glucose blood (ONE TOUCH ULTRA TEST) test strip    sodium chloride flush (NS) 0.9 % injection 10 mL    Myra Gianotti, PA-C Surgical Short Stay/Anesthesiology [facility] Phone [phone] [facility] Phone [phone] 06/23/2021 2:21 PM

## 2021-06-23 NOTE — Anesthesia Preprocedure Evaluation (Addendum)
Anesthesia Evaluation  Patient identified by MRN, date of birth, ID band Patient awake    Reviewed: Allergy & Precautions, NPO status , Patient's Chart, lab work & pertinent test results  Airway Mallampati: I  TM Distance: >3 FB Neck ROM: Full    Dental no notable dental hx. (+) Teeth Intact, Dental Advisory Given   Pulmonary neg pulmonary ROS,    Pulmonary exam normal breath sounds clear to auscultation       Cardiovascular negative cardio ROS Normal cardiovascular exam Rhythm:Regular Rate:Normal  EKG: 04/15/21: Normal sinus rhythm with sinus arrhythmia Low voltage QRS Septal infarct , age undetermined Abnormal ECG No acute changes Confirmed by Addison Lank [phone]) on 04/15/2021 5:40:31 AM   CV: Echo 03/24/21: IMPRESSIONS  1. Left ventricular ejection fraction, by estimation, is 60 to 65%. The  left ventricle has normal function. The left ventricle has no regional  wall motion abnormalities. Left ventricular diastolic parameters are  consistent with Grade I diastolic  dysfunction (impaired relaxation). The average left ventricular global  longitudinal strain is -20.5 %. The global longitudinal strain is normal.  2. Right ventricular systolic function is normal. The right ventricular  size is normal. There is normal pulmonary artery systolic pressure.  3. The mitral valve is normal in structure. Trivial mitral valve  regurgitation. No evidence of mitral stenosis.  4. The aortic valve is normal in structure. Aortic valve regurgitation is  not visualized. No aortic stenosis is present.  5. The inferior vena cava is normal in size with greater than 50%  respiratory variability, suggesting right atrial pressure of 3 mmHg.  - Comparison(s): No change since prior. GLS -19.2 >> -19.3.    Neuro/Psych negative neurological ROS  negative psych ROS   GI/Hepatic negative GI ROS, Neg liver ROS,   Endo/Other  diabetes, Type  2, Oral Hypoglycemic Agents  Renal/GU negative Renal ROS  negative genitourinary   Musculoskeletal negative musculoskeletal ROS (+)   Abdominal   Peds  Hematology negative hematology ROS (+)   Anesthesia Other Findings Left breast CA  Reproductive/Obstetrics                           Anesthesia Physical Anesthesia Plan  ASA: 2  Anesthesia Plan: MAC   Post-op Pain Management:    Induction: Intravenous  PONV Risk Score and Plan: 2 and Ondansetron, Dexamethasone and Midazolam  Airway Management Planned: Natural Airway  Additional Equipment:   Intra-op Plan:   Post-operative Plan:   Informed Consent: I have reviewed the patients History and Physical, chart, labs and discussed the procedure including the risks, benefits and alternatives for the proposed anesthesia with the patient or authorized representative who has indicated his/her understanding and acceptance.     Dental advisory given  Plan Discussed with: CRNA  Anesthesia Plan Comments:       Anesthesia Quick Evaluation

## 2021-06-23 NOTE — Patient Instructions (Signed)
[facility]COL[facility]  Discharge Instructions: Thank you for choosing [facility] to provide your oncology and hematology care.   If you have a lab appointment with the [facility], please go directly to the [facility] and check in at the registration area.   Wear comfortable clothing and clothing appropriate for easy access to any Portacath or PICC line.   We strive to give you quality time with your provider. You may need to reschedule your appointment if you arrive late (15 or more minutes).  Arriving late affects you and other patients whose appointments are after yours.  Also, if you miss three or more appointments without notifying the office, you may be dismissed from the clinic at the provider's discretion.      For prescription refill requests, have your pharmacy contact our office and allow 72 hours for refills to be completed.    Today you received the following chemotherapy and/or immunotherapy agents Herceptin and Perjeta      To help prevent nausea and vomiting after your treatment, we encourage you to take your nausea medication as directed.  BELOW ARE SYMPTOMS THAT SHOULD BE REPORTED IMMEDIATELY: *FEVER GREATER THAN 100.4 F (38 C) OR HIGHER *CHILLS OR SWEATING *NAUSEA AND VOMITING THAT IS NOT CONTROLLED WITH YOUR NAUSEA MEDICATION *UNUSUAL SHORTNESS OF BREATH *UNUSUAL BRUISING OR BLEEDING *URINARY PROBLEMS (pain or burning when urinating, or frequent urination) *BOWEL PROBLEMS (unusual diarrhea, constipation, pain near the anus) TENDERNESS IN MOUTH AND THROAT WITH OR WITHOUT PRESENCE OF ULCERS (sore throat, sores in mouth, or a toothache) UNUSUAL RASH, SWELLING OR PAIN  UNUSUAL VAGINAL DISCHARGE OR ITCHING   Items with * indicate a potential emergency and should be followed up as soon as possible or go to the Emergency Department if any problems should occur.  Please show the CHEMOTHERAPY ALERT CARD or IMMUNOTHERAPY ALERT CARD at  check-in to the Emergency Department and triage nurse.  Should you have questions after your visit or need to cancel or reschedule your appointment, please contact [facility]  Dept: [phone]  and follow the prompts.  Office hours are 8:00 a.m. to 4:30 p.m. Monday - Friday. Please note that voicemails left after 4:00 p.m. may not be returned until the following business day.  We are closed weekends and major holidays. You have access to a nurse at all times for urgent questions. Please call the main number to the clinic Dept: [phone] and follow the prompts.   For any non-urgent questions, you may also contact your provider using MyChart. We now offer e-Visits for anyone 72 and older to request care online for non-urgent symptoms. For details visit [email or web address].   Also download the MyChart app! Go to the app store, search "MyChart", open the app, select [facility], and log in with your MyChart username and password.  Due to Covid, a mask is required upon entering the hospital/clinic. If you do not have a mask, one will be given to you upon arrival. For doctor visits, patients may have 1 support person aged 75 or older with them. For treatment visits, patients cannot have anyone with them due to current Covid guidelines and our immunocompromised population.

## 2021-06-24 ENCOUNTER — Encounter (HOSPITAL_COMMUNITY): Payer: Self-pay | Admitting: General Surgery

## 2021-06-24 ENCOUNTER — Ambulatory Visit (HOSPITAL_COMMUNITY): Payer: 59 | Admitting: Vascular Surgery

## 2021-06-24 ENCOUNTER — Ambulatory Visit (HOSPITAL_COMMUNITY)
Admission: RE | Admit: 2021-06-24 | Discharge: 2021-06-24 | Disposition: A | Discharge status: Home / Self Care | Payer: 59 | Attending: General Surgery | Admitting: General Surgery

## 2021-06-24 ENCOUNTER — Other Ambulatory Visit: Payer: Self-pay

## 2021-06-24 ENCOUNTER — Encounter (HOSPITAL_COMMUNITY)
Admission: RE | Disposition: A | Discharge status: Home / Self Care | Payer: Self-pay | Source: Home / Self Care | Attending: General Surgery

## 2021-06-24 DIAGNOSIS — Z452 Encounter for adjustment and management of vascular access device: Secondary | ICD-10-CM | POA: Diagnosis present

## 2021-06-24 DIAGNOSIS — Z9221 Personal history of antineoplastic chemotherapy: Secondary | ICD-10-CM | POA: Diagnosis not present

## 2021-06-24 DIAGNOSIS — Z7984 Long term (current) use of oral hypoglycemic drugs: Secondary | ICD-10-CM | POA: Diagnosis not present

## 2021-06-24 DIAGNOSIS — E118 Type 2 diabetes mellitus with unspecified complications: Secondary | ICD-10-CM | POA: Diagnosis not present

## 2021-06-24 DIAGNOSIS — C50912 Malignant neoplasm of unspecified site of left female breast: Secondary | ICD-10-CM | POA: Diagnosis not present

## 2021-06-24 HISTORY — PX: PORT-A-CATH REMOVAL: SHX5289

## 2021-06-24 HISTORY — DX: Malignant (primary) neoplasm, unspecified: C80.1

## 2021-06-24 LAB — GLUCOSE, CAPILLARY
Glucose-Capillary: 199 mg/dL — ABNORMAL HIGH (ref 70–99)
Glucose-Capillary: 169 mg/dL — ABNORMAL HIGH (ref 70–99)

## 2021-06-24 SURGERY — REMOVAL PORT-A-CATH
Anesthesia: Monitor Anesthesia Care | Site: Chest | Laterality: Left

## 2021-06-24 MED ORDER — BUPIVACAINE-EPINEPHRINE (PF) 0.25% -1:200000 IJ SOLN
INTRAMUSCULAR | Status: AC
  Filled 2021-06-24: qty 30

## 2021-06-24 MED ORDER — LIDOCAINE HCL 1 % IJ SOLN
INTRAMUSCULAR | Status: DC | PRN
  Administered 2021-06-24: 10 mL via INTRAMUSCULAR

## 2021-06-24 MED ORDER — FENTANYL CITRATE (PF) 250 MCG/5ML IJ SOLN
INTRAMUSCULAR | Status: DC | PRN
  Administered 2021-06-24 (×2): 25 ug via INTRAVENOUS

## 2021-06-24 MED ORDER — ORAL CARE MOUTH RINSE: 15.0000 mL | Freq: Once | OROMUCOSAL | Status: DC

## 2021-06-24 MED ORDER — CHLORHEXIDINE GLUCONATE 0.12 % MT SOLN
OROMUCOSAL | Status: AC
  Filled 2021-06-24: qty 15

## 2021-06-24 MED ORDER — FENTANYL CITRATE (PF) 100 MCG/2ML IJ SOLN: 25.0000 ug | INTRAMUSCULAR | Status: DC | PRN

## 2021-06-24 MED ORDER — PROPOFOL 10 MG/ML IV BOLUS
INTRAVENOUS | Status: DC | PRN
  Administered 2021-06-24 (×3): 20 mg via INTRAVENOUS

## 2021-06-24 MED ORDER — CHLORHEXIDINE GLUCONATE CLOTH 2 % EX PADS: 6.0000 | MEDICATED_PAD | Freq: Once | CUTANEOUS | Status: DC

## 2021-06-24 MED ORDER — MIDAZOLAM HCL 2 MG/2ML IJ SOLN
INTRAMUSCULAR | Status: DC | PRN
  Administered 2021-06-24: 2 mg via INTRAVENOUS

## 2021-06-24 MED ORDER — CEFAZOLIN SODIUM-DEXTROSE 2-4 GM/100ML-% IV SOLN
2.0000 g | INTRAVENOUS | Status: AC
  Administered 2021-06-24: 2 g via INTRAVENOUS
  Filled 2021-06-24: qty 100

## 2021-06-24 MED ORDER — LACTATED RINGERS IV SOLN: INTRAVENOUS | Status: DC

## 2021-06-24 MED ORDER — ACETAMINOPHEN 500 MG PO TABS
1000.0000 mg | ORAL_TABLET | ORAL | Status: AC
  Administered 2021-06-24: 1000 mg via ORAL
  Filled 2021-06-24: qty 2

## 2021-06-24 MED ORDER — MIDAZOLAM HCL 2 MG/2ML IJ SOLN
INTRAMUSCULAR | Status: AC
  Filled 2021-06-24: qty 2

## 2021-06-24 MED ORDER — PROPOFOL 500 MG/50ML IV EMUL
INTRAVENOUS | Status: DC | PRN
  Administered 2021-06-24: 150 ug/kg/min via INTRAVENOUS

## 2021-06-24 MED ORDER — PROPOFOL 10 MG/ML IV BOLUS
INTRAVENOUS | Status: AC
  Filled 2021-06-24: qty 20

## 2021-06-24 MED ORDER — LIDOCAINE 2% (20 MG/ML) 5 ML SYRINGE
INTRAMUSCULAR | Status: DC | PRN
  Administered 2021-06-24: 40 mg via INTRAVENOUS

## 2021-06-24 MED ORDER — OXYCODONE HCL 5 MG PO TABS: 5.0000 mg | ORAL_TABLET | Freq: Four times a day (QID) | ORAL | 0 refills | Status: DC | PRN

## 2021-06-24 MED ORDER — LIDOCAINE HCL 1 % IJ SOLN
INTRAMUSCULAR | Status: AC
  Filled 2021-06-24: qty 20

## 2021-06-24 MED ORDER — FENTANYL CITRATE (PF) 250 MCG/5ML IJ SOLN
INTRAMUSCULAR | Status: AC
  Filled 2021-06-24: qty 5

## 2021-06-24 MED ORDER — CHLORHEXIDINE GLUCONATE 0.12 % MT SOLN: 15.0000 mL | Freq: Once | OROMUCOSAL | Status: DC

## 2021-06-24 MED ORDER — ONDANSETRON HCL 4 MG/2ML IJ SOLN
INTRAMUSCULAR | Status: DC | PRN
  Administered 2021-06-24: 4 mg via INTRAVENOUS

## 2021-06-24 MED ORDER — ENSURE PRE-SURGERY PO LIQD: 296.0000 mL | Freq: Once | ORAL | Status: DC

## 2021-06-24 MED ORDER — 0.9 % SODIUM CHLORIDE (POUR BTL) OPTIME
TOPICAL | Status: DC | PRN
  Administered 2021-06-24: 1000 mL

## 2021-06-24 SURGICAL SUPPLY — 30 items
ADH SKN CLS APL DERMABOND .7 (GAUZE/BANDAGES/DRESSINGS) ×1
BAG COUNTER SPONGE SURGICOUNT (BAG) ×2 IMPLANT
BAG SPNG CNTER NS LX DISP (BAG) ×1
CHLORAPREP W/TINT 10.5 ML (MISCELLANEOUS) ×2 IMPLANT
COVER SURGICAL LIGHT HANDLE (MISCELLANEOUS) ×2 IMPLANT
DERMABOND ADVANCED (GAUZE/BANDAGES/DRESSINGS) ×1
DERMABOND ADVANCED .7 DNX12 (GAUZE/BANDAGES/DRESSINGS) ×1 IMPLANT
DRAPE LAPAROTOMY 100X72 PEDS (DRAPES) ×2 IMPLANT
ELECT CAUTERY BLADE 6.4 (BLADE) ×2 IMPLANT
ELECT REM PT RETURN 9FT ADLT (ELECTROSURGICAL) ×2
ELECTRODE REM PT RTRN 9FT ADLT (ELECTROSURGICAL) ×1 IMPLANT
GAUZE 4X4 16PLY ~~LOC~~+RFID DBL (SPONGE) ×2 IMPLANT
GLOVE SURG ENC MOIS LTX SZ6 (GLOVE) ×2 IMPLANT
GLOVE SURG UNDER LTX SZ6.5 (GLOVE) ×2 IMPLANT
GOWN STRL REUS W/ TWL LRG LVL3 (GOWN DISPOSABLE) ×1 IMPLANT
GOWN STRL REUS W/TWL 2XL LVL3 (GOWN DISPOSABLE) ×2 IMPLANT
GOWN STRL REUS W/TWL LRG LVL3 (GOWN DISPOSABLE) ×2
KIT BASIN OR (CUSTOM PROCEDURE TRAY) ×2 IMPLANT
KIT TURNOVER KIT B (KITS) ×2 IMPLANT
NDL HYPO 25GX1X1/2 BEV (NEEDLE) ×1 IMPLANT
NEEDLE HYPO 25GX1X1/2 BEV (NEEDLE) ×2 IMPLANT
NS IRRIG 1000ML POUR BTL (IV SOLUTION) ×2 IMPLANT
PACK GENERAL/GYN (CUSTOM PROCEDURE TRAY) ×2 IMPLANT
PAD ARMBOARD 7.5X6 YLW CONV (MISCELLANEOUS) ×4 IMPLANT
SUT MON AB 4-0 PC3 18 (SUTURE) ×2 IMPLANT
SUT VIC AB 3-0 SH 27 (SUTURE) ×2
SUT VIC AB 3-0 SH 27X BRD (SUTURE) ×1 IMPLANT
SYR CONTROL 10ML LL (SYRINGE) ×2 IMPLANT
TOWEL GREEN STERILE (TOWEL DISPOSABLE) ×2 IMPLANT
TOWEL GREEN STERILE FF (TOWEL DISPOSABLE) ×2 IMPLANT

## 2021-06-24 NOTE — Anesthesia Procedure Notes (Signed)
Procedure Name: MAC Date/Time: 06/24/2021 12:03 PM Performed by: Ardyth Harps, CRNA Pre-anesthesia Checklist: Patient identified, Emergency Drugs available, Suction available, Patient being monitored and Timeout performed Patient Re-evaluated:Patient Re-evaluated prior to induction Oxygen Delivery Method: Simple face mask Dental Injury: Teeth and Oropharynx as per pre-operative assessment

## 2021-06-24 NOTE — Discharge Instructions (Addendum)
[facility] Office Phone Number [phone]   POST OP INSTRUCTIONS  Always review your discharge instruction sheet given to you by the facility where your surgery was performed.  IF YOU HAVE DISABILITY OR FAMILY LEAVE FORMS, YOU MUST BRING THEM TO THE OFFICE FOR PROCESSING.  DO NOT GIVE THEM TO YOUR DOCTOR.  A prescription for pain medication may be given to you upon discharge.  Take your pain medication as prescribed, if needed.  If narcotic pain medicine is not needed, then you may take acetaminophen (Tylenol) or ibuprofen (Advil) as needed. Take your usually prescribed medications unless otherwise directed If you need a refill on your pain medication, please contact your pharmacy.  They will contact our office to request authorization.  Prescriptions will not be filled after 5pm or on week-ends. You should eat very light the first 24 hours after surgery, such as soup, crackers, pudding, etc.  Resume your normal diet the day after surgery It is common to experience some constipation if taking pain medication after surgery.  Increasing fluid intake and taking a stool softener will usually help or prevent this problem from occurring.  A mild laxative (Milk of Magnesia or Miralax) should be taken according to package directions if there are no bowel movements after 48 hours. You may shower in 48 hours.  The surgical glue will flake off in 2-3 weeks.   ACTIVITIES:  No strenuous activity or heavy lifting for 1 week.   You may drive when you no longer are taking prescription pain medication, you can comfortably wear a seatbelt, and you can safely maneuver your car and apply brakes. RETURN TO WORK:  __________as tolerated as long as no lifting for 1 week.  _______________ Dennis Bast should see your doctor in the office for a follow-up appointment approximately three-four weeks after your surgery.    WHEN TO CALL YOUR DOCTOR: Fever over 101.0 Nausea and/or vomiting. Extreme swelling or  bruising. Continued bleeding from incision. Increased pain, redness, or drainage from the incision.  The clinic staff is available to answer your questions during regular business hours.  Please don't hesitate to call and ask to speak to one of the nurses for clinical concerns.  If you have a medical emergency, go to the nearest emergency room or call 911.  A surgeon from [facility] is always on call at the hospital.  For further questions, please visit [email or web address]

## 2021-06-24 NOTE — H&P (Signed)
Robin Kennedy is an 45 y.o. female.   Chief Complaint: port in place HPI:  Pt is a lovely 45 yo F s/p breast conserving surgery for left breast cancer.  She required chemotherapy and has completed this. She desired port removal.    Past Medical History:  Diagnosis Date   Cancer ([facility]) 06/05/2020   left breast   Diabetes mellitus without complication ([facility])    Personal history of chemotherapy    SVD (spontaneous vaginal delivery)    X 3    Past Surgical History:  Procedure Laterality Date   BREAST BIOPSY Left 06/02/2020   x2   BREAST BIOPSY Right 06/22/2020   CYSTOSCOPY N/A 12/20/2016   Procedure: CYSTOSCOPY;  Surgeon: Lavonia Drafts, MD;  Location: [facility];  Service: Gynecology;  Laterality: N/A;   MODIFIED MASTECTOMY Left 11/09/2020   Procedure: LEFT MODIFIED MASTECTOMY WITH LYMPH NODE DISSECTION;  Surgeon: [name], MD;  Location: [facility];  Service: General;  Laterality: Left;   PORTACATH PLACEMENT Left 06/23/2020   Procedure: INSERTION PORT-A-CATH WITH ULTRASOUND GUIDANCE;  Surgeon: [name], MD;  Location: [facility];  Service: General;  Laterality: Left;   RADIOACTIVE SEED GUIDED EXCISIONAL BREAST BIOPSY Right 11/09/2020   Procedure: RIGHT BREAST SEED LOCALIZED EXCISIONAL BIOPSY;  Surgeon: [name], MD;  Location: [facility];  Service: General;  Laterality: Right;   ROBOTIC ASSISTED TOTAL HYSTERECTOMY WITH BILATERAL SALPINGO OOPHERECTOMY Bilateral 12/20/2016   Procedure: ROBOTIC ASSISTED TOTAL HYSTERECTOMY WITH BILATERAL SALPINGO OOPHORECTOMY;  Surgeon: Lavonia Drafts, MD;  Location: [facility];  Service: Gynecology;  Laterality: Bilateral;   TUBAL LIGATION     WISDOM TOOTH EXTRACTION      Family History  Problem Relation Age of Onset   Diabetes Brother    Asthma Daughter    GER disease Daughter    Stroke Paternal Grandmother    Breast cancer Paternal Grandmother        unknown age   Sarcoidosis  Mother    Hyperlipidemia Maternal Aunt    Stroke Maternal Uncle    Social History:  reports that she has never smoked. She has never used smokeless tobacco. She reports that she does not drink alcohol and does not use drugs.  Allergies:  Allergies  Allergen Reactions   Metformin Hcl Other (See Comments)    GI upset    Medications Prior to Admission  Medication Sig Dispense Refill   calcium-vitamin D (OSCAL WITH D) 500MG -200UNIT (5MCG) tablet Take 1 tablet by mouth daily with breakfast.     empagliflozin (JARDIANCE) 10 MG TABS tablet Take 1 tablet (10 mg total) by mouth daily before breakfast. 30 tablet    letrozole (FEMARA) 2.5 MG tablet Take 1 tablet (2.5 mg total) by mouth daily. 90 tablet 3   lidocaine-prilocaine (EMLA) cream Apply 1 application topically as needed. 30 g 1   TRESIBA FLEXTOUCH 100 UNIT/ML FlexTouch Pen Inject 18 Units into the skin daily after breakfast.     glucose blood (ONE TOUCH ULTRA TEST) test strip Use to check cbgs qd 100 each 11    Results for orders placed or performed during the hospital encounter of 06/24/21 (from the past 48 hour(s))  Glucose, capillary     Status: Abnormal   Collection Time: 06/24/21 10:09 AM  Result Value Ref Range   Glucose-Capillary 199 (H) 70 - 99 mg/dL    Comment: Glucose reference range applies only to samples taken after fasting for at least 8 hours.   No results found.  Review of Systems  All other systems reviewed and are negative.  Blood pressure 114/81, pulse 68, temperature 98.3 F (36.8 C), temperature source Oral, resp. rate 18, height 5\' 8"  (1.727 m), weight 85.3 kg, last menstrual period 11/06/2016, SpO2 100 %. Physical Exam Vitals reviewed.  Constitutional:      Appearance: Normal appearance.  HENT:     Head: Normocephalic and atraumatic.  Eyes:     General: No scleral icterus.    Extraocular Movements: Extraocular movements intact.     Pupils: Pupils are equal, round, and reactive to light.   Cardiovascular:     Rate and Rhythm: Normal rate and regular rhythm.  Pulmonary:     Effort: Pulmonary effort is normal. No respiratory distress.     Comments: Left sided port in place Abdominal:     General: Abdomen is flat.  Skin:    General: Skin is warm and dry.     Capillary Refill: Capillary refill takes 2 to 3 seconds.  Neurological:     General: No focal deficit present.     Mental Status: She is alert and oriented to person, place, and time.  Psychiatric:        Mood and Affect: Mood normal.        Behavior: Behavior normal.        Thought Content: Thought content normal.        Judgment: Judgment normal.     Assessment/Plan Port in place Left breast cancer, s/p chemotherapy  Reviewed surgery, risks, recovery, post op restrictions. Pt desires to proceed.   [name], MD 06/24/2021, 11:50 AM

## 2021-06-24 NOTE — Op Note (Signed)
  PRE-OPERATIVE DIAGNOSIS:  un-needed Port-A-Cath for left breast cancer  POST-OPERATIVE DIAGNOSIS:  Same   PROCEDURE:  Procedure(s):  REMOVAL PORT-A-CATH  SURGEON:  Surgeon(s):  [name], MD  ANESTHESIA:   MAC + local  EBL:   Minimal  SPECIMEN:  None  Complications : none known  Procedure:   Pt was  identified in the holding area and taken to the operating room where she was placed supine on the operating room table.  MAC anesthesia was induced.  The left upper chest was prepped and draped.  The prior incision was anesthetized with local anesthetic.  The incision was opened with a #15 blade.  The subcutaneous tissue was divided with the cautery.  The port was identified and the capsule opened.  The four 2-0 prolene sutures were removed.  The port was then removed and pressure held on the tract.  The catheter appeared intact without evidence of breakage, length was 23.5.  The wound was inspected for hemostasis, which was achieved with cautery.  The wound was closed with 3-0 vicryl deep dermal interrupted sutures and 4-0 Monocryl running subcuticular suture.  The wound was cleaned, dried, and dressed with dermabond.  The patient was awakened from anesthesia and taken to the PACU in stable condition.  Needle, sponge, and instrument counts are correct.

## 2021-06-24 NOTE — Anesthesia Postprocedure Evaluation (Signed)
Anesthesia Post Note  Patient: Industrial/product designer  Procedure(s) Performed: REMOVAL PORT-A-CATH (Left: Chest)     Patient location during evaluation: PACU Anesthesia Type: MAC Level of consciousness: awake and alert Pain management: pain level controlled Vital Signs Assessment: post-procedure vital signs reviewed and stable Respiratory status: spontaneous breathing, nonlabored ventilation, respiratory function stable and patient connected to nasal cannula oxygen Cardiovascular status: blood pressure returned to baseline and stable Postop Assessment: no apparent nausea or vomiting Anesthetic complications: no   No notable events documented.  Last Vitals:  Vitals:   06/24/21 1258 06/24/21 1259  BP: 92/74 96/73  Pulse: 95 95  Resp: 15 15  Temp:  36.6 C  SpO2: 100% 97%    Last Pain:  Vitals:   06/24/21 1259  TempSrc:   PainSc: 0-No pain                 Keyen Marban L Naheem Mosco

## 2021-06-24 NOTE — Transfer of Care (Signed)
Immediate Anesthesia Transfer of Care Note  Patient: Robin Kennedy  Procedure(s) Performed: REMOVAL PORT-A-CATH (Left: Chest)  Patient Location: PACU  Anesthesia Type:MAC  Level of Consciousness: awake and alert   Airway & Oxygen Therapy: Patient Spontanous Breathing  Post-op Assessment: Report given to RN and Post -op Vital signs reviewed and stable  Post vital signs: Reviewed and stable  Last Vitals:  Vitals Value Taken Time  BP 104/76 06/24/21 1243  Temp    Pulse 101 06/24/21 1243  Resp 16 06/24/21 1243  SpO2 100 % 06/24/21 1243  Vitals shown include unvalidated device data.  Last Pain:  Vitals:   06/24/21 1015  TempSrc:   PainSc: 0-No pain         Complications: No notable events documented.

## 2021-06-25 ENCOUNTER — Encounter (HOSPITAL_COMMUNITY): Payer: Self-pay | Admitting: General Surgery

## 2021-08-19 ENCOUNTER — Telehealth: Payer: Self-pay | Admitting: Adult Health

## 2021-08-19 NOTE — Telephone Encounter (Signed)
Called and talked to patient about survivorship care plan visit.  She would like to participate in this and I have sent a scheduling message for her to come in on January 18.  Wilber Bihari, NP 08/19/21 3:33 PM Medical Oncology and Hematology [facility] [place], [place] [address] Tel. [phone]    Fax. [phone]

## 2021-08-23 ENCOUNTER — Ambulatory Visit: Payer: 59 | Attending: General Surgery

## 2021-08-23 ENCOUNTER — Other Ambulatory Visit: Payer: Self-pay

## 2021-08-23 VITALS — Wt 191.5 lb

## 2021-08-23 DIAGNOSIS — Z483 Aftercare following surgery for neoplasm: Secondary | ICD-10-CM | POA: Insufficient documentation

## 2021-08-23 NOTE — Therapy (Signed)
[facility] @ [address] [place], [place], [address] Phone: [phone]   Fax:  [phone]  Physical Therapy Treatment  Patient Details  Name: Robin Kennedy MRN: [id number] Date of Birth: March 24, 1976 Referring Provider (PT): Dr. [name]   Encounter Date: 08/23/2021   PT End of Session - 08/23/21 0833     Visit Number 16   # unchanged due to screen only   PT Start Time 0829    PT Stop Time 0835    PT Time Calculation (min) 6 min    Activity Tolerance Patient tolerated treatment well    Behavior During Therapy [facility] for tasks assessed/performed             Past Medical History:  Diagnosis Date   Cancer ([place]) 06/05/2020   left breast   Diabetes mellitus without complication ([place])    Personal history of chemotherapy    SVD (spontaneous vaginal delivery)    X 3    Past Surgical History:  Procedure Laterality Date   BREAST BIOPSY Left 06/02/2020   x2   BREAST BIOPSY Right 06/22/2020   CYSTOSCOPY N/A 12/20/2016   Procedure: CYSTOSCOPY;  Surgeon: Lavonia Drafts, MD;  Location: [facility];  Service: Gynecology;  Laterality: N/A;   MODIFIED MASTECTOMY Left 11/09/2020   Procedure: LEFT MODIFIED MASTECTOMY WITH LYMPH NODE DISSECTION;  Surgeon: [name], MD;  Location: [facility];  Service: General;  Laterality: Left;   PORT-A-CATH REMOVAL Left 06/24/2021   Procedure: REMOVAL PORT-A-CATH;  Surgeon: [name], MD;  Location: [facility];  Service: General;  Laterality: Left;   PORTACATH PLACEMENT Left 06/23/2020   Procedure: INSERTION PORT-A-CATH WITH ULTRASOUND GUIDANCE;  Surgeon: [name], MD;  Location: [facility];  Service: General;  Laterality: Left;   RADIOACTIVE SEED GUIDED EXCISIONAL BREAST BIOPSY Right 11/09/2020   Procedure: RIGHT BREAST SEED LOCALIZED EXCISIONAL BIOPSY;  Surgeon: [name], MD;  Location: [facility];  Service: General;   Laterality: Right;   ROBOTIC ASSISTED TOTAL HYSTERECTOMY WITH BILATERAL SALPINGO OOPHERECTOMY Bilateral 12/20/2016   Procedure: ROBOTIC ASSISTED TOTAL HYSTERECTOMY WITH BILATERAL SALPINGO OOPHORECTOMY;  Surgeon: Lavonia Drafts, MD;  Location: [facility];  Service: Gynecology;  Laterality: Bilateral;   TUBAL LIGATION     WISDOM TOOTH EXTRACTION      Vitals:   08/23/21 0830  Weight: 191 lb 8 oz (86.9 kg)     Subjective Assessment - 08/23/21 0833     Subjective Pt returns for 3 month L-Dex screen.    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 It is ER positive, PR negative, and HER2 positive with a Ki67 of 60%. She underwent neoadjuvant chemotherapy 06/25/2020-10/07/2020 followed by a right lumpectomy and a left mastectomy with an axillary lymph node dissection (19 negative nodes removed) on 11/09/2020. She has type II diabetes.                    L-DEX FLOWSHEETS - 08/23/21 0800       L-DEX LYMPHEDEMA SCREENING   Measurement Type Unilateral    L-DEX MEASUREMENT EXTREMITY Upper Extremity    POSITION  Standing    DOMINANT SIDE Right    At Risk Side Left    BASELINE SCORE (UNILATERAL) 4.7    L-DEX SCORE (UNILATERAL) 6.8    VALUE CHANGE (UNILAT) 2.1  PT Long Term Goals - 03/08/21 1413       PT LONG TERM GOAL #1   Title Patient will demonstrate she has regained full shoulder ROM and function post op compared to baseline assessments.    Status Achieved      PT LONG TERM GOAL #2   Title Patient will increase left shoulder flexion AROM to >/= 160 degrees for increased ease reaching overhead.    Status Achieved      PT LONG TERM GOAL #3   Title Patient will increase left shoulder AROM abduction to >/= 160 degrees for increased ability to obtain radiation positioning.    Status Achieved      PT LONG TERM GOAL #4   Title Patient will  improve her DASH score to </= 10 for improved overall UE function.    Baseline 4.55 pre-op; 75 post op; 11.83 - 01/05/21, 10% on 03/08/21    Status Achieved      PT LONG TERM GOAL #5   Title Patient will verbalize understnading of lymphedema risk reduction practices.    Baseline Pt has met this goal as she attended the ABC class and questions were answered for this at the following session later that same afternoon - 01/05/21    Status Achieved                   Plan - 08/23/21 0835     Clinical Impression Statement Pt returns for her 3 month L-Dex screen. Her change from baseline of 2.1 is WNLs so no further treatment is required at this time except to cont every 3 month L-Dex screens which pt is agreeable to.    PT Next Visit Plan Cont every 3 month L-Dex screens for up to 2 years from her ALND (~11/10/2022)    Consulted and Agree with Plan of Care Patient             Patient will benefit from skilled therapeutic intervention in order to improve the following deficits and impairments:     Visit Diagnosis: Aftercare following surgery for neoplasm     Problem List Patient Active Problem List   Diagnosis Date Noted   Breast cancer metastasized to axillary lymph node, left ([place]) 11/09/2020   Port-A-Cath in place 07/02/2020   Genetic testing 06/29/2020   Malignant neoplasm of upper-inner quadrant of left breast in female, estrogen receptor positive ([place]) 06/05/2020   Post-operative state 12/20/2016   Fibroids 12/06/2016   Pelvic pain in female 12/06/2016   Diabetes mellitus ([place]) 06/19/2012    Otelia Limes, PTA 08/23/2021, 8:37 AM  [facility] @ [address] [place], [place], [address] Phone: [phone]   Fax:  [phone]  Name: Robin Kennedy MRN: [id number] Date of Birth: Dec 12, 1975

## 2021-09-01 ENCOUNTER — Encounter: Payer: Self-pay | Admitting: Adult Health

## 2021-09-01 ENCOUNTER — Other Ambulatory Visit: Payer: Self-pay

## 2021-09-01 ENCOUNTER — Inpatient Hospital Stay: Payer: 59

## 2021-09-01 ENCOUNTER — Inpatient Hospital Stay: Payer: 59 | Attending: Hematology and Oncology | Admitting: Adult Health

## 2021-09-01 ENCOUNTER — Encounter: Payer: Self-pay | Admitting: Obstetrics & Gynecology

## 2021-09-01 VITALS — BP 125/82 | HR 89 | Temp 97.6°F | Resp 18 | Ht 68.0 in | Wt 196.2 lb

## 2021-09-01 DIAGNOSIS — Z923 Personal history of irradiation: Secondary | ICD-10-CM | POA: Diagnosis not present

## 2021-09-01 DIAGNOSIS — Z8269 Family history of other diseases of the musculoskeletal system and connective tissue: Secondary | ICD-10-CM | POA: Diagnosis not present

## 2021-09-01 DIAGNOSIS — R5383 Other fatigue: Secondary | ICD-10-CM | POA: Insufficient documentation

## 2021-09-01 DIAGNOSIS — Z79811 Long term (current) use of aromatase inhibitors: Secondary | ICD-10-CM | POA: Diagnosis not present

## 2021-09-01 DIAGNOSIS — Z170 Estrogen receptor positive status [ER+]: Secondary | ICD-10-CM

## 2021-09-01 DIAGNOSIS — E119 Type 2 diabetes mellitus without complications: Secondary | ICD-10-CM | POA: Diagnosis not present

## 2021-09-01 DIAGNOSIS — Z794 Long term (current) use of insulin: Secondary | ICD-10-CM | POA: Diagnosis not present

## 2021-09-01 DIAGNOSIS — Z8379 Family history of other diseases of the digestive system: Secondary | ICD-10-CM | POA: Insufficient documentation

## 2021-09-01 DIAGNOSIS — Z836 Family history of other diseases of the respiratory system: Secondary | ICD-10-CM | POA: Insufficient documentation

## 2021-09-01 DIAGNOSIS — Z8349 Family history of other endocrine, nutritional and metabolic diseases: Secondary | ICD-10-CM | POA: Insufficient documentation

## 2021-09-01 DIAGNOSIS — Z1211 Encounter for screening for malignant neoplasm of colon: Secondary | ICD-10-CM | POA: Diagnosis not present

## 2021-09-01 DIAGNOSIS — Z23 Encounter for immunization: Secondary | ICD-10-CM | POA: Diagnosis not present

## 2021-09-01 DIAGNOSIS — Z833 Family history of diabetes mellitus: Secondary | ICD-10-CM | POA: Insufficient documentation

## 2021-09-01 DIAGNOSIS — Z823 Family history of stroke: Secondary | ICD-10-CM | POA: Insufficient documentation

## 2021-09-01 DIAGNOSIS — C50212 Malignant neoplasm of upper-inner quadrant of left female breast: Secondary | ICD-10-CM

## 2021-09-01 DIAGNOSIS — E2839 Other primary ovarian failure: Secondary | ICD-10-CM | POA: Diagnosis not present

## 2021-09-01 DIAGNOSIS — E049 Nontoxic goiter, unspecified: Secondary | ICD-10-CM | POA: Insufficient documentation

## 2021-09-01 DIAGNOSIS — Z803 Family history of malignant neoplasm of breast: Secondary | ICD-10-CM | POA: Insufficient documentation

## 2021-09-01 DIAGNOSIS — Z79899 Other long term (current) drug therapy: Secondary | ICD-10-CM | POA: Diagnosis not present

## 2021-09-01 DIAGNOSIS — E6609 Other obesity due to excess calories: Secondary | ICD-10-CM | POA: Insufficient documentation

## 2021-09-01 MED ORDER — PNEUMOCOCCAL 20-VAL CONJ VACC 0.5 ML IM SUSY
0.5000 mL | PREFILLED_SYRINGE | Freq: Once | INTRAMUSCULAR | Status: AC
  Administered 2021-09-01: 0.5 mL via INTRAMUSCULAR
  Filled 2021-09-01: qty 0.5

## 2021-09-01 MED ORDER — PNEUMOCOCCAL 20-VAL CONJ VACC 0.5 ML IM SUSY
0.5000 mL | PREFILLED_SYRINGE | Freq: Once | INTRAMUSCULAR | Status: DC
  Filled 2021-09-01: qty 0.5

## 2021-09-01 NOTE — Progress Notes (Signed)
Per LC, Prevnar vaccine

## 2021-09-01 NOTE — Progress Notes (Signed)
SURVIVORSHIP VISIT:  BRIEF ONCOLOGIC HISTORY:  Oncology History  Malignant neoplasm of upper-inner quadrant of left breast in female, estrogen receptor positive (Robin Kennedy Kennedy)  06/05/2020 Initial Diagnosis   06/05/2020: Enlarging left breast with pain and diffuse skin thickening: Mammogram revealed 3.5 cm mass 11 o'clock position with 3 abnormal lymph nodes: 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 Right breast benign-appearing calcifications previous stereotactic biopsy was attempted but it was too superficial and felt to be benign.   06/10/2020 Cancer Staging   Staging form: Breast, AJCC 8th Edition - Clinical stage from 06/10/2020: Stage IIIB (cT4d, cN3c, cM0, G3, ER+, PR-, HER2+) - Signed by Robin Kennedy Gibson, MD on 12/22/2020 Stage prefix: Initial diagnosis Histologic grading system: 3 grade system Laterality: Left Staged by: Pathologist and managing physician Stage used in treatment planning: Yes National guidelines used in treatment planning: Yes Type of national guideline used in treatment planning: NCCN    06/25/2020 - 10/09/2020 Chemotherapy   Neoadjuvant chemotherapy with [facility] Perjeta 6 cycles followed by Herceptin Perjeta maintenance for 1 year        06/29/2020 Genetic Testing   Negative genetic testing: no pathogenic variants detected in [facility] Common Hereditary Cancers Panel.  The report date is June 29, 2020.   The Common Hereditary Cancers Panel offered by [facility] includes sequencing and/or deletion duplication testing of the following 48 genes: APC, ATM, AXIN2, BARD1, BMPR1A, BRCA1, BRCA2, BRIP1, CDH1, CDK4, CDKN2A (p14ARF), CDKN2A (p16INK4a), CHEK2, CTNNA1, DICER1, EPCAM (Deletion/duplication testing only), GREM1 (promoter region deletion/duplication testing only), KIT, MEN1, MLH1, MSH2, MSH3, MSH6, MUTYH, NBN, NF1, NHTL1, PALB2, PDGFRA, PMS2, POLD1, POLE, PTEN, RAD50, RAD51C, RAD51D, RNF43, SDHB, SDHC, SDHD, SMAD4, SMARCA4. STK11,  TP53, TSC1, TSC2, and VHL.  The following genes were evaluated for sequence changes only: SDHA and HOXB13 c.251G>A variant only.   10/28/2020 - 06/23/2021 Chemotherapy   Patient is on Treatment Plan : BREAST Trastuzumab + Pertuzumab q21d     11/09/2020 Surgery   Right lumpectomy and left mastectomy Robin Kennedy Kennedy):  Right breast: no evidence of malignancy Left breast: no residual carcinoma, with 19 left axillary lymph nodes negative for carcinoma.   11/09/2020 Cancer Staging   Staging form: Breast, AJCC 8th Edition - Pathologic stage from 11/09/2020: No Stage Recommended (ypT0, pN0, cM0) - Signed by Robin Kennedy Phlegm, NP on 08/23/2021 Stage prefix: Post-therapy    12/29/2020 - 02/09/2021 Radiation Therapy   Adjuvant radiation 12/29/2020 through 02/09/2021 Site Technique Total Dose (Gy) Dose per Fx (Gy) Completed Fx Beam Energies  Chest Wall, Left: CW_Lt_IMN 3D 50/50 2 25/25 6X  Chest Wall, Left: CW_Lt_SCV_PAB 3D 50/50 2 25/25 6X, 15X  Chest Wall, Left: CW_Lt_Bst Electron 10/10 2 5/5 6E    04/2021 -  Anti-estrogen oral therapy   Letrozole daily     INTERVAL HISTORY:  Robin Kennedy Kennedy to review her survivorship care plan detailing her treatment course for breast cancer, as well as monitoring long-term side effects of that treatment, education regarding health maintenance, screening, and overall wellness and health promotion.     Overall, Robin Kennedy Kennedy reports feeling quite well.  She is taking letrozole daily.  She tolerates this moderately well.  She has some increased tightness in her left upper arm.  Otherwise she is doing well today.  She denies any other issues including cough shortness of breath chest pain funny heartbeats fevers chills or new pain.  She is still mildly fatigued.  REVIEW OF SYSTEMS:  Review of Systems  Constitutional:  Positive for fatigue. Negative for appetite change, chills, fever and unexpected weight change.  HENT:   Negative for hearing loss, lump/mass and trouble  swallowing.   Eyes:  Negative for eye problems and icterus.  Respiratory:  Negative for chest tightness, cough and shortness of breath.   Cardiovascular:  Negative for chest pain, leg swelling and palpitations.  Gastrointestinal:  Negative for abdominal distention, abdominal pain, constipation, diarrhea, nausea and vomiting.  Endocrine: Negative for hot flashes.  Genitourinary:  Negative for difficulty urinating.   Musculoskeletal:  Negative for arthralgias.  Skin:  Negative for itching and rash.  Neurological:  Negative for dizziness, extremity weakness, headaches and numbness.  Hematological:  Negative for adenopathy. Does not bruise/bleed easily.  Psychiatric/Behavioral:  Negative for depression. The patient is not nervous/anxious.   Breast: Denies any new nodularity, masses, tenderness, nipple changes, or nipple discharge.      ONCOLOGY TREATMENT TEAM:  1. Surgeon:  Dr. Barry Kennedy at [facility] 2. Medical Oncologist: Dr. Lindi Kennedy  3. Radiation Oncologist: Dr. Isidore Kennedy    PAST MEDICAL/SURGICAL HISTORY:  Past Medical History:  Diagnosis Date   Cancer ([place]) 06/05/2020   left breast   Diabetes mellitus without complication ([place])    Fibroids 12/06/2016   Personal history of chemotherapy    SVD (spontaneous vaginal delivery)    X 3   Past Surgical History:  Procedure Laterality Date   BREAST BIOPSY Left 06/02/2020   x2   BREAST BIOPSY Right 06/22/2020   CYSTOSCOPY N/A 12/20/2016   Procedure: CYSTOSCOPY;  Surgeon: Robin Kennedy Drafts, MD;  Location: [facility];  Service: Gynecology;  Laterality: N/A;   MODIFIED MASTECTOMY Left 11/09/2020   Procedure: LEFT MODIFIED MASTECTOMY WITH LYMPH NODE DISSECTION;  Surgeon: Robin Kennedy Klein, MD;  Location: [facility];  Service: General;  Laterality: Left;   PORT-A-CATH REMOVAL Left 06/24/2021   Procedure: REMOVAL PORT-A-CATH;  Surgeon: Robin Kennedy Klein, MD;  Location: [facility];  Service: General;  Laterality: Left;   PORTACATH  PLACEMENT Left 06/23/2020   Procedure: INSERTION PORT-A-CATH WITH ULTRASOUND GUIDANCE;  Surgeon: Robin Kennedy Klein, MD;  Location: [place];  Service: General;  Laterality: Left;   RADIOACTIVE SEED GUIDED EXCISIONAL BREAST BIOPSY Right 11/09/2020   Procedure: RIGHT BREAST SEED LOCALIZED EXCISIONAL BIOPSY;  Surgeon: Robin Kennedy Klein, MD;  Location: [place];  Service: General;  Laterality: Right;   ROBOTIC ASSISTED TOTAL HYSTERECTOMY WITH BILATERAL SALPINGO OOPHERECTOMY Bilateral 12/20/2016   Procedure: ROBOTIC ASSISTED TOTAL HYSTERECTOMY WITH BILATERAL SALPINGO OOPHORECTOMY;  Surgeon: Robin Kennedy Drafts, MD;  Location: [facility];  Service: Gynecology;  Laterality: Bilateral;   TUBAL LIGATION     WISDOM TOOTH EXTRACTION       ALLERGIES:  Allergies  Allergen Reactions   Metformin Hcl Other (See Comments)    GI upset     CURRENT MEDICATIONS:  Outpatient Encounter Medications as of 09/01/2021  Medication Sig   amoxicillin (AMOXIL) 500 MG tablet Take 500 mg by mouth 2 (two) times daily.   insulin aspart protamine- aspart (NOVOLOG MIX 70/30) (70-30) 100 UNIT/ML injection Inject into the skin.   calcium-vitamin D (OSCAL WITH D) 500MG-200UNIT (5MCG) tablet Take 1 tablet by mouth daily with breakfast.   empagliflozin (JARDIANCE) 10 MG TABS tablet Take 1 tablet (10 mg total) by mouth daily before breakfast.   glucose blood (ONE TOUCH ULTRA TEST) test strip Use to check cbgs qd   letrozole (FEMARA) 2.5 MG tablet Take 1 tablet (2.5 mg total) by mouth daily.   TRESIBA FLEXTOUCH 100 UNIT/ML  FlexTouch Pen Inject 18 Units into the skin daily after breakfast.   [DISCONTINUED] lidocaine-prilocaine (EMLA) cream Apply 1 application topically as needed. (Patient not taking: Reported on 09/01/2021)   [DISCONTINUED] oxyCODONE (OXY IR/ROXICODONE) 5 MG immediate release tablet Take 1 tablet (5 mg total) by mouth every 6 (six) hours as needed for severe pain. (Patient not taking: Reported  on 09/01/2021)   [DISCONTINUED] prochlorperazine (COMPAZINE) 10 MG tablet Take 1 tablet (10 mg total) by mouth every 6 (six) hours as needed (Nausea or vomiting).   [EXPIRED] pneumococcal 20-valent conjugate vaccine (PREVNAR 20) injection 0.5 mL    No facility-administered encounter medications on Kennedy as of 09/01/2021.     ONCOLOGIC FAMILY HISTORY:  Family History  Problem Relation Age of Onset   Diabetes Brother    Asthma Daughter    GER disease Daughter    Stroke Paternal Grandmother    Breast cancer Paternal Grandmother        unknown age   Sarcoidosis Mother    Hyperlipidemia Maternal Aunt    Stroke Maternal Uncle      GENETIC COUNSELING/TESTING: See above  SOCIAL HISTORY:  Social History   Socioeconomic History   Marital status: Married    Spouse name: Not on Kennedy   Number of children: Not on Kennedy   Years of education: Not on Kennedy   Highest education level: Not on Kennedy  Occupational History   Not on Kennedy  Tobacco Use   Smoking status: Never   Smokeless tobacco: Never  Vaping Use   Vaping Use: Never used  Substance and Sexual Activity   Alcohol use: No   Drug use: No   Sexual activity: Yes    Birth control/protection: Surgical  Other Topics Concern   Not on Kennedy  Social History Narrative   ** Merged History Encounter **       Social Determinants of Health   Financial Resource Strain: Low Risk    Difficulty of Paying Living Expenses: Not hard at all  Food Insecurity: No Food Insecurity   Worried About Charity fundraiser in the Last Year: Never true   Sisters in the Last Year: Never true  Transportation Needs: No Transportation Needs   Lack of Transportation (Medical): No   Lack of Transportation (Non-Medical): No  Physical Activity: Sufficiently Active   Days of Exercise per Week: 4 days   Minutes of Exercise per Session: 60 min  Stress: No Stress Concern Present   Feeling of Stress : Only a little  Social Connections: Psychologist, educational of Communication with Friends and Family: More than three times a week   Frequency of Social Gatherings with Friends and Family: Twice a week   Attends Religious Services: More than 4 times per year   Active Member of [employer] or Organizations: Yes   Attends Music therapist: More than 4 times per year   Marital Status: Married  Human resources officer Violence: Not At Risk   Fear of Current or Ex-Partner: No   Emotionally Abused: No   Physically Abused: No   Sexually Abused: No     OBSERVATIONS/OBJECTIVE:  BP 125/82 (BP Location: Right Arm, Patient Position: Sitting)    Pulse 89    Temp 97.6 F (36.4 C) (Temporal)    Resp 18    Ht _0  (1.727 m)    Wt 196 lb 3 oz (89 kg)    LMP 11/06/2016 (Exact Date)    SpO2 100%  BMI 29.83 kg/m  GENERAL: Patient is a well appearing female in no acute distress HEENT:  Sclerae anicteric.  Oropharynx clear and moist. No ulcerations or evidence of oropharyngeal candidiasis. Neck is supple.  NODES:  No cervical, supraclavicular, or axillary lymphadenopathy palpated.  BREAST EXAM:  right breast s/p lumpectomy, left breast s/p mastectomy and radiation, slight thickness noted towards left axilla and left axillary tail. LUNGS:  Clear to auscultation bilaterally.  No wheezes or rhonchi. HEART:  Regular rate and rhythm. No murmur appreciated. ABDOMEN:  Soft, nontender.  Positive, normoactive bowel sounds. No organomegaly palpated. MSK:  No focal spinal tenderness to palpation. Full range of motion bilaterally in the upper extremities. EXTREMITIES:  No peripheral edema.   SKIN:  Clear with no obvious rashes or skin changes. No nail dyscrasia. NEURO:  Nonfocal. Well oriented.  Appropriate affect.   LABORATORY DATA:  None for this visit.  DIAGNOSTIC IMAGING:  None for this visit.      ASSESSMENT AND PLAN:  Ms.. Kennedy is a pleasant 46 y.o. 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  She presents to the Survivorship Clinic for our initial meeting and routine follow-up post-completion of treatment for breast cancer.    1. Stage IIIB left breast cancer:  Robin Kennedy Kennedy is continuing to recover from definitive treatment for breast cancer. She will follow-up with her medical oncologist, Dr. Lindi Kennedy in 6 months with history and physical exam per surveillance protocol.  She will continue her anti-estrogen therapy with Letrozole. Thus far, she is tolerating the Letrozole well, with minimal side effects. She was instructed to make Dr. Lindi Kennedy or myself aware if she begins to experience any worsening side effects of the medication and I could see her back in clinic to help manage those side effects, as needed. Her right breast mammogram is scheduled in 09/2021.   Today, a comprehensive survivorship care plan and treatment summary was reviewed with the patient today detailing her breast cancer diagnosis, treatment course, potential late/long-term effects of treatment, appropriate follow-up care with recommendations for the future, and patient education resources.  A copy of this summary, along with a letter will be sent to the patients primary care provider via mail/fax/In Basket message after todays visit.    2. Left axillary fullness: Left axillary ultrasound ordered.    3. Bone health:  Given Robin Kennedy Kennedy's age/history of breast cancer and her current treatment regimen including anti-estrogen therapy with Letrozole, she is at risk for bone demineralization.  I placed an order for DEXA to be completed.  She was given education on specific activities to promote bone health.  4. Cancer screening:  Due to Robin Kennedy Kennedy's history and her age, she should receive screening for skin cancers, colon cancer, and gynecologic cancers.  The  information and recommendations are listed on the patient's comprehensive care plan/treatment summary and were reviewed in detail with the patient.  I placed a referral for her to see GI to discuss colonoscopy for colon cancer screening.  5. Health maintenance and wellness promotion: Robin Kennedy Kennedy was encouraged to consume 5-7 servings of fruits and vegetables per day. We reviewed the "Nutrition Rainbow" handout.  She was also encouraged to engage in moderate to vigorous exercise for 30 minutes per day most days of the week. We discussed the [employer] fitness program, which is designed for cancer survivors to help them become  more physically fit after cancer treatments.  She was instructed to limit her alcohol consumption and continue to abstain from tobacco use.    I reviewed Robin Kennedy Kennedy is eligible to receive pneumococcal vaccination since she is at increased risk due to her history of breast cancer, history of chemotherapy, and her history of diabetes.  She has never received this.  We reviewed risks and benefits and she agreed to proceed with this vaccination.   6. Support services/counseling: It is not uncommon for this period of the patient's cancer care trajectory to be one of many emotions and stressors.   She was given information regarding our available services and encouraged to contact me with any questions or for help enrolling in any of our support group/programs.    Follow up instructions:    -Return to cancer center in 6 months for f/u with Dr. Lindi Kennedy  -Mammogram due in 09/2021 -Bone density ordered -I referred Robin Kennedy Kennedy to GI for colonoscopy -Referral made to internal medicine so she can establish with primary care -Follow up with surgery in 1 year -She is welcome to return back to the Survivorship Clinic at any time; no additional follow-up needed at this time.  -Consider referral back to survivorship as a long-term survivor for continued surveillance  The patient was  provided an opportunity to ask questions and all were answered. The patient agreed with the plan and demonstrated an understanding of the instructions.   Total encounter time: 60 minutes in face-to-face visit time, chart review, lab review, care coordination, and documentation of the encounter.  Wilber Bihari, NP 09/01/21 1:23 PM Medical Oncology and Hematology [facility] [place], [place] [address] Tel. [phone]    Fax. [phone]  *Total Encounter Time as defined by the Centers for Medicare and Medicaid Services includes, in addition to the face-to-face time of a patient visit (documented in the note above) non-face-to-face time: obtaining and reviewing outside history, ordering and reviewing medications, tests or procedures, care coordination (communications with other health care professionals or caregivers) and documentation in the medical record.

## 2021-09-03 ENCOUNTER — Encounter: Payer: Self-pay | Admitting: Internal Medicine

## 2021-09-15 ENCOUNTER — Ambulatory Visit (INDEPENDENT_AMBULATORY_CARE_PROVIDER_SITE_OTHER): Payer: 59 | Admitting: Family

## 2021-09-15 ENCOUNTER — Encounter: Payer: Self-pay | Admitting: Family

## 2021-09-15 ENCOUNTER — Other Ambulatory Visit: Payer: Self-pay

## 2021-09-15 VITALS — BP 124/72 | HR 85 | Temp 97.5°F | Ht 68.0 in | Wt 200.0 lb

## 2021-09-15 DIAGNOSIS — E049 Nontoxic goiter, unspecified: Secondary | ICD-10-CM | POA: Diagnosis not present

## 2021-09-15 DIAGNOSIS — E119 Type 2 diabetes mellitus without complications: Secondary | ICD-10-CM | POA: Diagnosis not present

## 2021-09-15 DIAGNOSIS — Z13220 Encounter for screening for lipoid disorders: Secondary | ICD-10-CM

## 2021-09-15 DIAGNOSIS — Z6830 Body mass index (BMI) 30.0-30.9, adult: Secondary | ICD-10-CM

## 2021-09-15 DIAGNOSIS — C50912 Malignant neoplasm of unspecified site of left female breast: Secondary | ICD-10-CM

## 2021-09-15 DIAGNOSIS — E6609 Other obesity due to excess calories: Secondary | ICD-10-CM | POA: Diagnosis not present

## 2021-09-15 DIAGNOSIS — Z794 Long term (current) use of insulin: Secondary | ICD-10-CM | POA: Diagnosis not present

## 2021-09-15 HISTORY — PX: OTHER SURGICAL HISTORY: SHX169

## 2021-09-15 LAB — COMPREHENSIVE METABOLIC PANEL
ALT: 13 U/L (ref 0–35)
AST: 10 U/L (ref 0–37)
Albumin: 4.3 g/dL (ref 3.5–5.2)
Alkaline Phosphatase: 47 U/L (ref 39–117)
BUN: 30 mg/dL — ABNORMAL HIGH (ref 6–23)
CO2: 25 mEq/L (ref 19–32)
Calcium: 10 mg/dL (ref 8.4–10.5)
Chloride: 107 mEq/L (ref 96–112)
Creatinine, Ser: 0.87 mg/dL (ref 0.40–1.20)
GFR: 80.34 mL/min (ref >60.00)
Glucose, Bld: 167 mg/dL — ABNORMAL HIGH (ref 70–99)
Potassium: 4.5 mEq/L (ref 3.5–5.1)
Sodium: 139 mEq/L (ref 135–145)
Total Bilirubin: 0.4 mg/dL (ref 0.2–1.2)
Total Protein: 6.9 g/dL (ref 6.0–8.3)

## 2021-09-15 LAB — CBC WITH DIFFERENTIAL/PLATELET
Basophils Absolute: 0 10*3/uL (ref 0.0–0.1)
Basophils Relative: 0.5 % (ref 0.0–3.0)
Eosinophils Absolute: 0.1 10*3/uL (ref 0.0–0.7)
Eosinophils Relative: 2.4 % (ref 0.0–5.0)
HCT: 35.6 % — ABNORMAL LOW (ref 36.0–46.0)
Hemoglobin: 11.2 g/dL — ABNORMAL LOW (ref 12.0–15.0)
Lymphocytes Relative: 32.3 % (ref 12.0–46.0)
Lymphs Abs: 1.6 10*3/uL (ref 0.7–4.0)
MCHC: 31.5 g/dL (ref 30.0–36.0)
MCV: 82.7 fl (ref 78.0–100.0)
Monocytes Absolute: 0.4 10*3/uL (ref 0.1–1.0)
Monocytes Relative: 7.5 % (ref 3.0–12.0)
Neutro Abs: 2.9 10*3/uL (ref 1.4–7.7)
Neutrophils Relative %: 57.3 % (ref 43.0–77.0)
Platelets: 221 10*3/uL (ref 150.0–400.0)
RBC: 4.31 Mil/uL (ref 3.87–5.11)
RDW: 16.4 % — ABNORMAL HIGH (ref 11.5–15.5)
WBC: 5.1 10*3/uL (ref 4.0–10.5)

## 2021-09-15 LAB — LIPID PANEL
Cholesterol: 176 mg/dL (ref 0–200)
HDL: 60.3 mg/dL (ref >39.00)
LDL Cholesterol: 103 mg/dL — ABNORMAL HIGH (ref 0–99)
NonHDL: 115.88
Total CHOL/HDL Ratio: 3
Triglycerides: 65 mg/dL (ref 0.0–149.0)
VLDL: 13 mg/dL (ref 0.0–40.0)

## 2021-09-15 LAB — TSH: TSH: 1.64 u[IU]/mL (ref 0.35–5.50)

## 2021-09-15 NOTE — Assessment & Plan Note (Signed)
Work on diet and exercise as tolerated  ?

## 2021-09-15 NOTE — Assessment & Plan Note (Signed)
Lipid panel ordered, pending results.

## 2021-09-15 NOTE — Patient Instructions (Signed)
Stop by the lab prior to leaving today. I will notify you of your results once received.   It was a pleasure seeing you today! Please do not hesitate to reach out with any questions and or concerns.  Regards,   Eugenia Pancoast FNP-C

## 2021-09-15 NOTE — Assessment & Plan Note (Signed)
Will order TSH although this has been addressed in the past.  We will attempt to get records from endocrinology office faxing request today

## 2021-09-15 NOTE — Assessment & Plan Note (Signed)
History of breast cancer currently following with oncologist on letrozole daily.  Patient has follow-up in February and March for management of this.  Up-to-date with mammograms and has Pap scheduled in the next month with GYN

## 2021-09-15 NOTE — Progress Notes (Addendum)
Established Patient Office Visit  Subjective:  Patient ID: Robin Kennedy, female    DOB: [birth year]  Age: 46 y.o. MRN: [id number]  CC:  Chief Complaint  Patient presents with   New Patient (Initial Visit)    HPI Robin Kennedy is here for a transition of care visit.  Pt was seeing her OBGYN and oncology as a bit of stand in care for primary, but now that released from active treatment with oncology she needs a pcp.   DM2: sees Dr. Chalmers Cater, endocrinology. Sees her every three months or so. Will request/fax for records. Last A1c was 10. Currently on tresiba 18 units after breakfast, started on novolog sliding scale and jardiance as well. Has freestyle libre. Last visit two weeks ago 08/29/21 when she had her foot exam as well. Pt does remember having urine MA completed, she does not have the results with her right now.   Dentist: has f/u next week , Dr. Sabino Gasser, for left lower gumline infection/lesion, going to resect it along with the tooth.   Pt is without acute concerns.   Colonoscopy scheduled for 10/18/21 Mammogram scheduled 2 DM eye exam - 10/22   chronic concerns:  Left breast ER positive cancer, HCC: completed:  1. Neoadjuvant chemotherapy with TCH Perjeta 6 cycles followed by Herceptin Perjeta maintenance for 1 year 2. 11/09/20: Right lumpectomy and left mastectomy [name]): Right breast: no evidence of malignancy Left breast: no residual carcinoma, with 19 left axillary lymph nodes negative for carcinoma. 3. Followed by adjuvant radiation therapy (due to inflammatory breast changes at diagnosis)1. Neoadjuvant chemotherapy with [place] Perjeta 6 cycles followed by Herceptin Perjeta maintenance for 1 year Port removed 06/24/22. Pt followed up with dr. Delice Bison, oncologist and is started in survivorship program.  -Has mammogram and u/s in feb for left supraclavicular swelling/ and screening exam  -Has appt in march for Ct scan to eval subpectoral and supraclavicular lymph  nodes.   F/u appt 10/29/21 with Dr. Sonny Dandy (oncologist) F/u appt 09/22/21 with obgyn Dr. Ihor Dow for pap   Past Medical History:  Diagnosis Date   Breast cancer metastasized to axillary lymph node, left ([place]) 11/09/2020   Cancer ([place]) 06/05/2020   left breast   Diabetes mellitus without complication ([place])    Fibroids 12/06/2016   Malignant neoplasm of upper-inner quadrant of left breast in female, estrogen receptor positive ([place]) 06/05/2020   Personal history of chemotherapy    SVD (spontaneous vaginal delivery)    X 3    Past Surgical History:  Procedure Laterality Date   BREAST BIOPSY Left 06/02/2020   x2   BREAST BIOPSY Right 06/22/2020   CYSTOSCOPY N/A 12/20/2016   Procedure: CYSTOSCOPY;  Surgeon: Lavonia Drafts, MD;  Location: [facility];  Service: Gynecology;  Laterality: N/A;   MODIFIED MASTECTOMY Left 11/09/2020   Procedure: LEFT MODIFIED MASTECTOMY WITH LYMPH NODE DISSECTION;  Surgeon: [name], MD;  Location: [facility];  Service: General;  Laterality: Left;   PORT-A-CATH REMOVAL Left 06/24/2021   Procedure: REMOVAL PORT-A-CATH;  Surgeon: [name], MD;  Location: [facility];  Service: General;  Laterality: Left;   PORTACATH PLACEMENT Left 06/23/2020   Procedure: INSERTION PORT-A-CATH WITH ULTRASOUND GUIDANCE;  Surgeon: [name], MD;  Location: [facility];  Service: General;  Laterality: Left;   RADIOACTIVE SEED GUIDED EXCISIONAL BREAST BIOPSY Right 11/09/2020   Procedure: RIGHT BREAST SEED LOCALIZED EXCISIONAL BIOPSY;  Surgeon: [name], MD;  Location: [facility];  Service: General;  Laterality: Right;  ROBOTIC ASSISTED TOTAL HYSTERECTOMY WITH BILATERAL SALPINGO OOPHERECTOMY Bilateral 12/20/2016   Procedure: ROBOTIC ASSISTED TOTAL HYSTERECTOMY WITH BILATERAL SALPINGO OOPHORECTOMY;  Surgeon: Lavonia Drafts, MD;  Location: [facility];  Service: Gynecology;  Laterality: Bilateral;   TUBAL LIGATION     WISDOM  TOOTH EXTRACTION      Family History  Problem Relation Age of Onset   Sarcoidosis Mother    Diabetes Brother    Stroke Paternal Grandmother    Breast cancer Paternal Grandmother        unknown age   Asthma Daughter    GER disease Daughter    Hyperlipidemia Maternal Aunt    Stroke Maternal Uncle     Social History   Socioeconomic History   Marital status: Married    Spouse name: Not on file   Number of children: Not on file   Years of education: Not on file   Highest education level: Not on file  Occupational History    Comment: part time Doctor, hospital  at [employer]  Tobacco Use   Smoking status: Never   Smokeless tobacco: Never  Vaping Use   Vaping Use: Never used  Substance and Sexual Activity   Alcohol use: No   Drug use: No   Sexual activity: Yes    Partners: Male    Birth control/protection: Surgical  Other Topics Concern   Not on file  Social History Narrative   ** Merged History Encounter **       Social Determinants of Health   Financial Resource Strain: Low Risk    Difficulty of Paying Living Expenses: Not hard at all  Food Insecurity: No Food Insecurity   Worried About Charity fundraiser in the Last Year: Never true   [place] in the Last Year: Never true  Transportation Needs: No Transportation Needs   Lack of Transportation (Medical): No   Lack of Transportation (Non-Medical): No  Physical Activity: Sufficiently Active   Days of Exercise per Week: 4 days   Minutes of Exercise per Session: 60 min  Stress: No Stress Concern Present   Feeling of Stress : Only a little  Social Connections: Engineer, building services of Communication with Friends and Family: More than three times a week   Frequency of Social Gatherings with Friends and Family: Twice a week   Attends Religious Services: More than 4 times per year   Active Member of [employer] or Organizations: Yes   Attends Music therapist: More than 4 times per year    Marital Status: Married  Human resources officer Violence: Not At Risk   Fear of Current or Ex-Partner: No   Emotionally Abused: No   Physically Abused: No   Sexually Abused: No    Outpatient Medications Prior to Visit  Medication Sig Dispense Refill   calcium-vitamin D (OSCAL WITH D) 500MG -200UNIT (5MCG) tablet Take 1 tablet by mouth daily with breakfast.     Continuous Blood Gluc Sensor (FREESTYLE LIBRE 2 SENSOR) MISC Inject into the skin every 14 (fourteen) days.     empagliflozin (JARDIANCE) 10 MG TABS tablet Take 1 tablet (10 mg total) by mouth daily before breakfast. 30 tablet    glucose blood (ONE TOUCH ULTRA TEST) test strip Use to check cbgs qd 100 each 11   insulin aspart (NOVOLOG FLEXPEN) 100 UNIT/ML FlexPen 3-5u     letrozole (FEMARA) 2.5 MG tablet Take 1 tablet (2.5 mg total) by mouth daily. 90 tablet 3   TRESIBA FLEXTOUCH 100  UNIT/ML FlexTouch Pen Inject 18 Units into the skin daily after breakfast.     insulin aspart protamine- aspart (NOVOLOG MIX 70/30) (70-30) 100 UNIT/ML injection Inject into the skin.     No facility-administered medications prior to visit.    Allergies  Allergen Reactions   Metformin Hcl Other (See Comments)    GI upset    ROS Review of Systems  Review of Systems  Respiratory:  Negative for shortness of breath.   Cardiovascular:  Negative for chest pain and palpitations.  Gastrointestinal:  Negative for constipation and diarrhea.  Genitourinary:  Negative for dysuria, frequency and urgency.  Musculoskeletal:  Negative for myalgias.  Psychiatric/Behavioral:  Negative for depression and suicidal ideas.   All other systems reviewed and are negative.    Objective:    Physical Exam Neck:     Thyroid: Thyromegaly present. No thyroid mass or thyroid tenderness.   Gen: NAD, resting comfortably CV: RRR with no murmurs appreciated Pulm: NWOB, CTAB with no crackles, wheezes, or rhonchi Skin: warm, dry Psych: Normal affect and thought content  BP  124/72    Pulse 85    Temp (!) 97.5 F (36.4 C) (Temporal)    Ht 5\' 8"  (1.727 m)    Wt 200 lb (90.7 kg)    LMP 11/06/2016 (Exact Date)    SpO2 98%    BMI 30.41 kg/m  Wt Readings from Last 3 Encounters:  09/15/21 200 lb (90.7 kg)  09/01/21 196 lb 3 oz (89 kg)  08/23/21 191 lb 8 oz (86.9 kg)     There are no preventive care reminders to display for this patient.   There are no preventive care reminders to display for this patient.  Lab Results  Component Value Date   TSH 1.760 09/13/2019   Lab Results  Component Value Date   WBC 5.8 06/23/2021   HGB 10.2 (L) 06/23/2021   HCT 32.8 (L) 06/23/2021   MCV 83.9 06/23/2021   PLT 244 06/23/2021   Lab Results  Component Value Date   NA 142 06/23/2021   K 3.9 06/23/2021   CO2 23 06/23/2021   GLUCOSE 193 (H) 06/23/2021   BUN 22 (H) 06/23/2021   CREATININE 0.98 06/23/2021   BILITOT 0.3 06/23/2021   ALKPHOS 48 06/23/2021   AST 9 (L) 06/23/2021   ALT 10 06/23/2021   PROT 6.9 06/23/2021   ALBUMIN 3.7 06/23/2021   CALCIUM 9.5 06/23/2021   ANIONGAP 10 06/23/2021   Lab Results  Component Value Date   CHOL 151 09/21/2012   Lab Results  Component Value Date   HDL 44 09/21/2012   Lab Results  Component Value Date   LDLCALC 95 09/21/2012   Lab Results  Component Value Date   TRIG 58 09/21/2012   Lab Results  Component Value Date   CHOLHDL 3.4 09/21/2012   Lab Results  Component Value Date   HGBA1C 9.4 (H) 07/18/2018      Assessment & Plan:   Problem List Items Addressed This Visit       Endocrine   Controlled type 2 diabetes mellitus without complication, with long-term current use of insulin ([place])    Patient advised to continue with Endo as scheduled and maintain lab work and take monitor..  I am requesting results of the recent lab work as well so that we can collaborate. Work on diabetic diet and exercise as tolerated. Yearly foot exam, and annual eye exam.  Continue medications as prescribed by  endocrinology  Relevant Medications   insulin aspart (NOVOLOG FLEXPEN) 100 UNIT/ML FlexPen   Other Relevant Orders   Comprehensive metabolic panel   Nontoxic goiter - Primary    Will order TSH although this has been addressed in the past.  We will attempt to get records from endocrinology office faxing request today      Relevant Orders   TSH     Other   Screening for lipoid disorders    Lipid panel ordered, pending results.      Relevant Orders   Lipid Profile   Hormone receptor positive breast cancer, left ([place])    History of breast cancer currently following with oncologist on letrozole daily.  Patient has follow-up in February and March for management of this.  Up-to-date with mammograms and has Pap scheduled in the next month with GYN      Obesity due to excess calories    Work on diet and exercise as tolerated.      Relevant Medications   insulin aspart (NOVOLOG FLEXPEN) 100 UNIT/ML FlexPen   Other Relevant Orders   CBC w/Diff    No orders of the defined types were placed in this encounter.   Follow-up: Return in about 6 months (around 03/15/2022) for regular follow up appointment.    Eugenia Pancoast, FNP

## 2021-09-15 NOTE — Assessment & Plan Note (Signed)
Patient advised to continue with Endo as scheduled and maintain lab work and take monitor..  I am requesting results of the recent lab work as well so that we can collaborate. Work on diabetic diet and exercise as tolerated. Yearly foot exam, and annual eye exam.  Continue medications as prescribed by endocrinology

## 2021-09-22 ENCOUNTER — Encounter: Payer: Self-pay | Admitting: Obstetrics & Gynecology

## 2021-09-22 ENCOUNTER — Ambulatory Visit (INDEPENDENT_AMBULATORY_CARE_PROVIDER_SITE_OTHER): Payer: 59 | Admitting: Obstetrics & Gynecology

## 2021-09-22 ENCOUNTER — Other Ambulatory Visit: Payer: Self-pay

## 2021-09-22 VITALS — BP 131/71 | HR 109 | Resp 16 | Ht 68.0 in | Wt 200.0 lb

## 2021-09-22 DIAGNOSIS — Z01419 Encounter for gynecological examination (general) (routine) without abnormal findings: Secondary | ICD-10-CM | POA: Diagnosis not present

## 2021-09-22 NOTE — Progress Notes (Signed)
Subjective:     Robin Kennedy is a 46 y.o. female here for a routine exam.  Current complaints: no gyn complaints.   Pt is in the remission stages of breast cancer. On Letraszole for 5 years. Scheduled for colonoscopy in Mar.    Gynecologic History Patient's last menstrual period was 11/06/2016 (exact date). Contraception: s/p hysterectomy  Last Pap: 10/17/2016. Results were: normal Last mammogram: 10/05/2020. Results were: BI-RADS CATEGORY  6: Known biopsy-proven malignancy. S/p treatment and surgery  Obstetric History OB History  Gravida Para Term Preterm AB Living  4 3 3  0 1 3  SAB IAB Ectopic Multiple Live Births  0 1 0   3    # Outcome Date GA Lbr Len/2nd Weight Sex Delivery Anes PTL Lv  4 Term 02/06/02    M Vag-Spont   LIV  3 IAB 1998          2 Term 10/09/95    M Vag-Spont   LIV  1 Term 11/19/93    F Vag-Spont   LIV    The following portions of the patient's history were reviewed and updated as appropriate: allergies, current medications, past family history, past medical history, past social history, past surgical history, and problem list.  Review of Systems Pertinent items are noted in HPI.    Objective:  BP 131/71    Pulse (!) 109    Resp 16    Ht 5\' 8"  (1.727 m)    Wt 200 lb (90.7 kg)    LMP 11/06/2016 (Exact Date)    BMI 30.41 kg/m  General Appearance:    Alert, cooperative, no distress, appears stated age  Head:    Normocephalic, without obvious abnormality, atraumatic  Eyes:    conjunctiva/corneas clear, EOM's intact, both eyes  Ears:    Normal external ear canals, both ears  Nose:   Nares normal, septum midline, mucosa normal, no drainage    or sinus tenderness  Throat:   Lips, mucosa, and tongue normal; teeth and gums normal  Neck:   Supple, symmetrical, trachea midline, no adenopathy;    thyroid:  no enlargement/tenderness/nodules  Back:     Symmetric, no curvature, ROM normal, no CVA tenderness  Lungs:     respirations unlabored  Chest Wall:    No  tenderness or deformity   Heart:    Regular rate and rhythm  Breast Exam:    Breast exam not done. There is a fluid collection under left axilla. No masses palpated.    Abdomen:     Soft, non-tender, bowel sounds active all four quadrants,    no masses, no organomegaly. Well healed port sites.   Genitalia:    Normal female without lesion, discharge or tenderness   Vaginal cuff well healed   Extremities:   Extremities normal, atraumatic, no cyanosis or edema  Pulses:   2+ and symmetric all extremities  Skin:   Skin color, texture, turgor normal, no rashes or lesions     Assessment:    Healthy female exam.  Pt doing well physically and mentally after treatment for breast cancer.   Plan:   F/u in 1 year or sooner prn   Sherly Brodbeck L. Ihor Dow, M.D., [name] /

## 2021-10-04 ENCOUNTER — Ambulatory Visit (AMBULATORY_SURGERY_CENTER): Payer: 59 | Admitting: *Deleted

## 2021-10-04 ENCOUNTER — Other Ambulatory Visit: Payer: Self-pay

## 2021-10-04 VITALS — Ht 68.0 in | Wt 199.0 lb

## 2021-10-04 DIAGNOSIS — Z1211 Encounter for screening for malignant neoplasm of colon: Secondary | ICD-10-CM

## 2021-10-04 MED ORDER — NA SULFATE-K SULFATE-MG SULF 17.5-3.13-1.6 GM/177ML PO SOLN: 1.0000 | Freq: Once | ORAL | 0 refills | Status: AC

## 2021-10-04 NOTE — Progress Notes (Signed)
No egg or soy allergy known to patient  No issues known to pt with past sedation with any surgeries or procedures Patient denies ever being told they had issues or difficulty with intubation  No FH of Malignant Hyperthermia Pt is not on diet pills Pt is not on  home 02  Pt is not on blood thinners  Pt denies issues with constipation  No A fib or A flutter  Pt is fully vaccinated  for Covid   NO PA's for preps discussed with pt In PV today  Discussed with pt there will be an out-of-pocket cost for prep and that varies from $0 to 70 +  dollars - pt verbalized understanding  Instructed to try the [place] for Generic Suprep Coupon   Due to the COVID-19 pandemic we are asking patients to follow certain guidelines in PV and the [place]   Pt aware of COVID protocols and [facility] guidelines   PV completed over the phone. Pt verified name, DOB, address and insurance during PV today.  Pt mailed instruction packet with copy of consent form to read and not return, and instructions.  Pt encouraged to call with questions or issues.  If pt has My chart, procedure instructions sent via My Chart

## 2021-10-06 ENCOUNTER — Ambulatory Visit
Admission: RE | Admit: 2021-10-06 | Discharge: 2021-10-06 | Disposition: A | Discharge status: Home / Self Care | Payer: 59 | Source: Ambulatory Visit | Attending: Hematology and Oncology | Admitting: Hematology and Oncology

## 2021-10-06 ENCOUNTER — Other Ambulatory Visit: Payer: Self-pay

## 2021-10-06 ENCOUNTER — Ambulatory Visit
Admission: RE | Admit: 2021-10-06 | Discharge: 2021-10-06 | Disposition: A | Discharge status: Home / Self Care | Payer: 59 | Source: Ambulatory Visit | Attending: Adult Health | Admitting: Adult Health

## 2021-10-06 DIAGNOSIS — Z170 Estrogen receptor positive status [ER+]: Secondary | ICD-10-CM

## 2021-10-06 DIAGNOSIS — C50212 Malignant neoplasm of upper-inner quadrant of left female breast: Secondary | ICD-10-CM

## 2021-10-11 ENCOUNTER — Encounter: Payer: Self-pay | Admitting: Internal Medicine

## 2021-10-18 ENCOUNTER — Ambulatory Visit (AMBULATORY_SURGERY_CENTER): Payer: 59 | Admitting: Internal Medicine

## 2021-10-18 ENCOUNTER — Encounter: Payer: Self-pay | Admitting: Internal Medicine

## 2021-10-18 VITALS — BP 144/65 | HR 69 | Temp 96.4°F | Resp 13 | Ht 68.0 in | Wt 199.0 lb

## 2021-10-18 DIAGNOSIS — K621 Rectal polyp: Secondary | ICD-10-CM

## 2021-10-18 DIAGNOSIS — Z1211 Encounter for screening for malignant neoplasm of colon: Secondary | ICD-10-CM

## 2021-10-18 DIAGNOSIS — D129 Benign neoplasm of anus and anal canal: Secondary | ICD-10-CM

## 2021-10-18 DIAGNOSIS — D125 Benign neoplasm of sigmoid colon: Secondary | ICD-10-CM

## 2021-10-18 DIAGNOSIS — K635 Polyp of colon: Secondary | ICD-10-CM

## 2021-10-18 MED ORDER — SODIUM CHLORIDE 0.9 % IV SOLN: 500.0000 mL | Freq: Once | INTRAVENOUS | Status: DC

## 2021-10-18 NOTE — Progress Notes (Signed)
Pt's states no medical or surgical changes since previsit or office visit. 

## 2021-10-18 NOTE — Progress Notes (Signed)
Called to room to assist during endoscopic procedure.  Patient ID and intended procedure confirmed with present staff. Received instructions for my participation in the procedure from the performing physician.  

## 2021-10-18 NOTE — Patient Instructions (Signed)
Thank you for letting us take care of your healthcare needs today. ?Please see handouts given to you on Polyps and hemorrhoids. ? ? ? ?YOU HAD AN ENDOSCOPIC PROCEDURE TODAY AT [facility]:   Refer to the procedure report that was given to you for any specific questions about what was found during the examination.  If the procedure report does not answer your questions, please call your gastroenterologist to clarify.  If you requested that your care partner not be given the details of your procedure findings, then the procedure report has been included in a sealed envelope for you to review at your convenience later. ? ?YOU SHOULD EXPECT: Some feelings of bloating in the abdomen. Passage of more gas than usual.  Walking can help get rid of the air that was put into your GI tract during the procedure and reduce the bloating. If you had a lower endoscopy (such as a colonoscopy or flexible sigmoidoscopy) you may notice spotting of blood in your stool or on the toilet paper. If you underwent a bowel prep for your procedure, you may not have a normal bowel movement for a few days. ? ?Please Note:  You might notice some irritation and congestion in your nose or some drainage.  This is from the oxygen used during your procedure.  There is no need for concern and it should clear up in a day or so. ? ?SYMPTOMS TO REPORT IMMEDIATELY: ? ?Following lower endoscopy (colonoscopy or flexible sigmoidoscopy): ? Excessive amounts of blood in the stool ? Significant tenderness or worsening of abdominal pains ? Swelling of the abdomen that is new, acute ? Fever of 100?F or higher ? ? ?For urgent or emergent issues, a gastroenterologist can be reached at any hour by calling [phone]. ?Do not use MyChart messaging for urgent concerns.  ? ? ?DIET:  We do recommend a small meal at first, but then you may proceed to your regular diet.  Drink plenty of fluids but you should avoid alcoholic beverages for 24  hours. ? ?ACTIVITY:  You should plan to take it easy for the rest of today and you should NOT DRIVE or use heavy machinery until tomorrow (because of the sedation medicines used during the test).   ? ?FOLLOW UP: ?Our staff will call the number listed on your records 48-72 hours following your procedure to check on you and address any questions or concerns that you may have regarding the information given to you following your procedure. If we do not reach you, we will leave a message.  We will attempt to reach you two times.  During this call, we will ask if you have developed any symptoms of COVID 19. If you develop any symptoms (ie: fever, flu-like symptoms, shortness of breath, cough etc.) before then, please call [phone].  If you test positive for Covid 19 in the 2 weeks post procedure, please call and report this information to [place].   ? ?If any biopsies were taken you will be contacted by phone or by letter within the next 1-3 weeks.  Please call us at [phone] if you have not heard about the biopsies in 3 weeks.  ? ? ?SIGNATURES/CONFIDENTIALITY: ?You and/or your care partner have signed paperwork which will be entered into your electronic medical record.  These signatures attest to the fact that that the information above on your After Visit Summary has been reviewed and is understood.  Full responsibility of the confidentiality of this discharge information  lies with you and/or your care-partner.  ?

## 2021-10-18 NOTE — Op Note (Signed)
[facility] ?Patient Name: Robin Kennedy ?Procedure Date: 10/18/2021 9:29 AM ?MRN: [id number] ?Endoscopist: Robin Kennedy "[name] ,  ?Age: 46 ?Referring MD:  ?Date of Birth: [birth year] ?Gender: Female ?Account #: [id number] ?Procedure:                Colonoscopy ?Indications:              Screening for colorectal malignant neoplasm, This  ?                          is the patient's first colonoscopy ?Medicines:                Monitored Anesthesia Care ?Procedure:                Pre-Anesthesia Assessment: ?                          - Prior to the procedure, a History and Physical  ?                          was performed, and patient medications and  ?                          allergies were reviewed. The patient's tolerance of  ?                          previous anesthesia was also reviewed. The risks  ?                          and benefits of the procedure and the sedation  ?                          options and risks were discussed with the patient.  ?                          All questions were answered, and informed consent  ?                          was obtained. Prior Anticoagulants: The patient has  ?                          taken no previous anticoagulant or antiplatelet  ?                          agents. ASA Grade Assessment: III - A patient with  ?                          severe systemic disease. After reviewing the risks  ?                          and benefits, the patient was deemed in  ?                          satisfactory condition to undergo the procedure. ?  After obtaining informed consent, the colonoscope  ?                          was passed under direct vision. Throughout the  ?                          procedure, the patient's blood pressure, pulse, and  ?                          oxygen saturations were monitored continuously. The  ?                          CF [id number] #[id number] was introduced through the anus  ?                          and  advanced to the the terminal ileum. The  ?                          colonoscopy was performed without difficulty. The  ?                          patient tolerated the procedure well. The quality  ?                          of the bowel preparation was good. The terminal  ?                          ileum, ileocecal valve, appendiceal orifice, and  ?                          rectum were photographed. ?Scope In: 8:14:48 AM ?Scope Out: 10:10:18 AM ?Scope Withdrawal Time: 0 hours 14 minutes 26 seconds  ?Total Procedure Duration: 0 hours 24 minutes 1 second  ?Findings:                 The terminal ileum appeared normal. ?                          Two sessile polyps were found in the rectum and  ?                          sigmoid colon. The polyps were 3 to 4 mm in size.  ?                          These polyps were removed with a cold snare.  ?                          Resection and retrieval were complete. ?                          Non-bleeding internal hemorrhoids were found during  ?                          retroflexion. ?Complications:  No immediate complications. ?Estimated Blood Loss:     Estimated blood loss was minimal. ?Impression:               - The examined portion of the ileum was normal. ?                          - Two 3 to 4 mm polyps in the rectum and in the  ?                          sigmoid colon, removed with a cold snare. Resected  ?                          and retrieved. ?                          - Non-bleeding internal hemorrhoids. ?Recommendation:           - Discharge patient to home (with escort). ?                          - Await pathology results. ?                          - The findings and recommendations were discussed  ?                          with the patient. ?[name],  ?10/18/2021 10:13:19 AM ?

## 2021-10-18 NOTE — Progress Notes (Signed)
Vss nad trans to pacu °

## 2021-10-18 NOTE — Progress Notes (Signed)
? ?GASTROENTEROLOGY PROCEDURE H&P NOTE  ? ?Primary Care Physician: ?Eugenia Pancoast, [place] ? ? ? ?Reason for Procedure:   Colon cancer screening ? ?Plan:    Colonoscopy ? ?Patient is appropriate for endoscopic procedure(s) in the ambulatory ([place]) setting. ? ?The nature of the procedure, as well as the risks, benefits, and alternatives were carefully and thoroughly reviewed with the patient. Ample time for discussion and questions allowed. The patient understood, was satisfied, and agreed to proceed.  ? ? ? ?HPI: ?Robin Kennedy is a 46 y.o. female who presents for colonoscopy for colon cancer screening. Denies changes in bowel habits or weight loss. Has had one episode of scant rectal bleeding. Denies fam hx of colon cancer. ? ?Past Medical History:  ?Diagnosis Date  ? Breast cancer metastasized to axillary lymph node, left ([place]) 11/09/2020  ? Cancer ([place]) 06/05/2020  ? left breast  ? Diabetes mellitus without complication ([place])   ? Fibroids 12/06/2016  ? Malignant neoplasm of upper-inner quadrant of left breast in female, estrogen receptor positive ([place]) 06/05/2020  ? Neuromuscular disorder ([place])   ? 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 Personal history of chemotherapy   ? ended 10-07-2020 and did herceptin thru 06-23-2021  ? Personal history of radiation therapy   ? 01-31-2021  ? SVD (spontaneous vaginal delivery)   ? X 3  ? ? ?Past Surgical History:  ?Procedure Laterality Date  ? BREAST BIOPSY Left 06/02/2020  ? x2  ? BREAST BIOPSY Right 06/22/2020  ? CYSTOSCOPY N/A 12/20/2016  ? Procedure: CYSTOSCOPY;  Surgeon: Lavonia Drafts, MD;  Location: [facility];  Service: Gynecology;  Laterality: N/A;  ? MODIFIED MASTECTOMY Left 11/09/2020  ? Procedure: LEFT MODIFIED MASTECTOMY WITH LYMPH NODE DISSECTION;  Surgeon: [name], MD;  Location: [facility];  Service: General;  Laterality: Left;  ? PORT-A-CATH REMOVAL Left 06/24/2021  ? Procedure: REMOVAL PORT-A-CATH;  Surgeon: [name],  MD;  Location: [facility];  Service: General;  Laterality: Left;  ? PORTACATH PLACEMENT Left 06/23/2020  ? Procedure: INSERTION PORT-A-CATH WITH ULTRASOUND GUIDANCE;  Surgeon: [name], MD;  Location: [facility];  Service: General;  Laterality: Left;  ? RADIOACTIVE SEED GUIDED EXCISIONAL BREAST BIOPSY Right 11/09/2020  ? Procedure: RIGHT BREAST SEED LOCALIZED EXCISIONAL BIOPSY;  Surgeon: [name], MD;  Location: [facility];  Service: General;  Laterality: Right;  ? ROBOTIC ASSISTED TOTAL HYSTERECTOMY WITH BILATERAL SALPINGO OOPHERECTOMY Bilateral 12/20/2016  ? Procedure: ROBOTIC ASSISTED TOTAL HYSTERECTOMY WITH BILATERAL SALPINGO OOPHORECTOMY;  Surgeon: Lavonia Drafts, MD;  Location: [facility];  Service: Gynecology;  Laterality: Bilateral;  ? tooth removal  09/2021  ? TUBAL LIGATION    ? WISDOM TOOTH EXTRACTION    ? ? ?Prior to Admission medications   ?Medication Sig Start Date End Date Taking? Authorizing Provider  ?BD PEN NEEDLE NANO 2ND GEN 32G X 4 MM MISC USE 4 TIMES A DAY AS DIRECTED 09/21/21   [provider]  ?calcium-vitamin D (OSCAL WITH D) '500MG'$ -200UNIT (5MCG) tablet Take 1 tablet by mouth daily with breakfast.    [provider]  ?Continuous Blood Gluc Sensor (FREESTYLE LIBRE 2 SENSOR) MISC Inject into the skin every 14 (fourteen) days. 09/09/21   [provider]  ?empagliflozin (JARDIANCE) 10 MG TABS tablet Take 1 tablet (10 mg total) by mouth daily before breakfast. 05/03/21   Nicholas Lose, MD  ?glucose blood (ONE TOUCH ULTRA TEST) test strip Use to check cbgs qd 09/21/12   Shawnee Knapp, MD  ?  insulin aspart (NOVOLOG FLEXPEN) 100 UNIT/ML FlexPen 3-5u 08/30/21   [provider]  ?letrozole (FEMARA) 2.5 MG tablet Take 1 tablet (2.5 mg total) by mouth daily. 04/21/21   Nicholas Lose, MD  ?TRESIBA FLEXTOUCH 100 UNIT/ML FlexTouch Pen Inject 22 Units into the skin daily after breakfast. 07/11/20   [provider]  ?prochlorperazine  (COMPAZINE) 10 MG tablet Take 1 tablet (10 mg total) by mouth every 6 (six) hours as needed (Nausea or vomiting). 06/10/20 10/28/20  Nicholas Lose, MD  ? ? ?Current Outpatient Medications  ?Medication Sig Dispense Refill  ? BD PEN NEEDLE NANO 2ND GEN 32G X 4 MM MISC USE 4 TIMES A DAY AS DIRECTED    ? calcium-vitamin D (OSCAL WITH D) '500MG'$ -200UNIT (5MCG) tablet Take 1 tablet by mouth daily with breakfast.    ? Continuous Blood Gluc Sensor (FREESTYLE LIBRE 2 SENSOR) MISC Inject into the skin every 14 (fourteen) days.    ? empagliflozin (JARDIANCE) 10 MG TABS tablet Take 1 tablet (10 mg total) by mouth daily before breakfast. 30 tablet   ? glucose blood (ONE TOUCH ULTRA TEST) test strip Use to check cbgs qd 100 each 11  ? insulin aspart (NOVOLOG FLEXPEN) 100 UNIT/ML FlexPen 3-5u    ? letrozole (FEMARA) 2.5 MG tablet Take 1 tablet (2.5 mg total) by mouth daily. 90 tablet 3  ? TRESIBA FLEXTOUCH 100 UNIT/ML FlexTouch Pen Inject 22 Units into the skin daily after breakfast.    ? ?No current facility-administered medications for this visit.  ? ? ?Allergies as of 10/18/2021 - Review Complete 10/18/2021  ?Allergen Reaction Noted  ? Metformin hcl Other (See Comments) 04/27/2021  ? ? ?Family History  ?Problem Relation Age of Onset  ? Sarcoidosis Mother   ? Colon polyps Brother   ? Diabetes Brother   ? Hyperlipidemia Maternal Aunt   ? Stroke Maternal Uncle   ? Stroke Paternal Grandmother   ? Breast cancer Paternal Grandmother   ?     unknown age  ? Asthma Daughter   ? GER disease Daughter   ? Colon cancer Neg Hx   ? Esophageal cancer Neg Hx   ? Rectal cancer Neg Hx   ? Stomach cancer Neg Hx   ? ? ?Social History  ? ?Socioeconomic History  ? Marital status: Married  ?  Spouse name: Not on file  ? Number of children: Not on file  ? Years of education: Not on file  ? Highest education level: Not on file  ?Occupational History  ?  Comment: part time cake decorator  at [employer]  ?Tobacco Use  ? Smoking status: Never  ? Smokeless  tobacco: Never  ?Vaping Use  ? Vaping Use: Never used  ?Substance and Sexual Activity  ? Alcohol use: No  ? Drug use: No  ? Sexual activity: Yes  ?  Partners: Male  ?  Birth control/protection: Surgical  ?Other Topics Concern  ? Not on file  ?Social History Narrative  ? ** Merged History Encounter **  ?    ? ?Social Determinants of Health  ? ?Financial Resource Strain: Low Risk   ? Difficulty of Paying Living Expenses: Not hard at all  ?Food Insecurity: No Food Insecurity  ? Worried About Charity fundraiser in the Last Year: Never true  ? Ran Out of Food in the Last Year: Never true  ?Transportation Needs: No Transportation Needs  ? Lack of Transportation (Medical): No  ? Lack of Transportation (Non-Medical): No  ?Physical  Activity: Sufficiently Active  ? Days of Exercise per Week: 4 days  ? Minutes of Exercise per Session: 60 min  ?Stress: No Stress Concern Present  ? Feeling of Stress : Only a little  ?Social Connections: Socially Integrated  ? Frequency of Communication with Friends and Family: More than three times a week  ? Frequency of Social Gatherings with Friends and Family: Twice a week  ? Attends Religious Services: More than 4 times per year  ? Active Member of Clubs or Organizations: Yes  ? Attends Archivist Meetings: More than 4 times per year  ? Marital Status: Married  ?Intimate Partner Violence: Not At Risk  ? Fear of Current or Ex-Partner: No  ? Emotionally Abused: No  ? Physically Abused: No  ? Sexually Abused: No  ? ? ?Physical Exam: ?Vital signs in last 24 hours: ?LMP 11/06/2016 (Exact Date)  ?GEN: NAD ?EYE: Sclerae anicteric ?ENT: MMM ?CV: Non-tachycardic ?Pulm: No increased work of breathing ?GI: Soft, NT/ND ?NEURO:  Alert & Oriented ? ? ?[name], MD ?[facility] ? ?10/18/2021 8:41 AM ? ?

## 2021-10-20 ENCOUNTER — Telehealth: Payer: Self-pay

## 2021-10-20 NOTE — Telephone Encounter (Signed)
?  Follow up Call- ? ?Call back number 10/18/2021  ?Post procedure Call Back phone  # [phone]  ?Permission to leave phone message Yes  ?Some recent data might be hidden  ?  ? ?Patient questions: ? ?Do you have a fever, pain , or abdominal swelling? No. ?Pain Score  0 * ? ?Have you tolerated food without any problems? Yes.   ? ?Have you been able to return to your normal activities? Yes.   ? ?Do you have any questions about your discharge instructions: ?Diet   No. ?Medications  No. ?Follow up visit  No. ? ?Do you have questions or concerns about your Care? No. ? ?Actions: ?* If pain score is 4 or above: ?No action needed, pain <4. ? ? ?

## 2021-10-21 ENCOUNTER — Encounter: Payer: Self-pay | Admitting: Internal Medicine

## 2021-10-27 ENCOUNTER — Other Ambulatory Visit: Payer: Self-pay

## 2021-10-27 ENCOUNTER — Inpatient Hospital Stay: Payer: 59 | Attending: Hematology and Oncology

## 2021-10-27 ENCOUNTER — Ambulatory Visit (HOSPITAL_COMMUNITY)
Admission: RE | Admit: 2021-10-27 | Discharge: 2021-10-27 | Disposition: A | Discharge status: Home / Self Care | Payer: 59 | Source: Ambulatory Visit | Attending: Hematology and Oncology | Admitting: Hematology and Oncology

## 2021-10-27 DIAGNOSIS — Z170 Estrogen receptor positive status [ER+]: Secondary | ICD-10-CM | POA: Insufficient documentation

## 2021-10-27 DIAGNOSIS — C50212 Malignant neoplasm of upper-inner quadrant of left female breast: Secondary | ICD-10-CM | POA: Diagnosis not present

## 2021-10-27 DIAGNOSIS — Z79811 Long term (current) use of aromatase inhibitors: Secondary | ICD-10-CM | POA: Insufficient documentation

## 2021-10-27 DIAGNOSIS — Z923 Personal history of irradiation: Secondary | ICD-10-CM | POA: Insufficient documentation

## 2021-10-27 DIAGNOSIS — Z9013 Acquired absence of bilateral breasts and nipples: Secondary | ICD-10-CM | POA: Insufficient documentation

## 2021-10-27 LAB — CMP (CANCER CENTER ONLY)
ALT: 11 U/L (ref 0–44)
AST: 9 U/L — ABNORMAL LOW (ref 15–41)
Albumin: 4.2 g/dL (ref 3.5–5.0)
Alkaline Phosphatase: 51 U/L (ref 38–126)
Anion gap: 7 (ref 5–15)
BUN: 19 mg/dL (ref 6–20)
CO2: 30 mmol/L (ref 22–32)
Calcium: 10.6 mg/dL — ABNORMAL HIGH (ref 8.9–10.3)
Chloride: 106 mmol/L (ref 98–111)
Creatinine: 0.86 mg/dL (ref 0.44–1.00)
GFR, Estimated: >60 mL/min (ref >60)
Glucose, Bld: 254 mg/dL — ABNORMAL HIGH (ref 70–99)
Potassium: 3.8 mmol/L (ref 3.5–5.1)
Sodium: 143 mmol/L (ref 135–145)
Total Bilirubin: 0.5 mg/dL (ref 0.3–1.2)
Total Protein: 7.3 g/dL (ref 6.5–8.1)

## 2021-10-27 LAB — CBC WITH DIFFERENTIAL (CANCER CENTER ONLY)
Abs Immature Granulocytes: 0.01 10*3/uL (ref 0.00–0.07)
Basophils Absolute: 0 10*3/uL (ref 0.0–0.1)
Basophils Relative: 0 %
Eosinophils Absolute: 0.1 10*3/uL (ref 0.0–0.5)
Eosinophils Relative: 1 %
HCT: 37 % (ref 36.0–46.0)
Hemoglobin: 11.8 g/dL — ABNORMAL LOW (ref 12.0–15.0)
Immature Granulocytes: 0 %
Lymphocytes Relative: 33 %
Lymphs Abs: 1.7 10*3/uL (ref 0.7–4.0)
MCH: 26.2 pg (ref 26.0–34.0)
MCHC: 31.9 g/dL (ref 30.0–36.0)
MCV: 82 fL (ref 80.0–100.0)
Monocytes Absolute: 0.4 10*3/uL (ref 0.1–1.0)
Monocytes Relative: 7 %
Neutro Abs: 3 10*3/uL (ref 1.7–7.7)
Neutrophils Relative %: 59 %
Platelet Count: 236 10*3/uL (ref 150–400)
RBC: 4.51 MIL/uL (ref 3.87–5.11)
RDW: 15.3 % (ref 11.5–15.5)
WBC Count: 5.2 10*3/uL (ref 4.0–10.5)
nRBC: 0 % (ref 0.0–0.2)

## 2021-10-27 MED ORDER — IOHEXOL 300 MG/ML  SOLN
100.0000 mL | Freq: Once | INTRAMUSCULAR | Status: AC | PRN
  Administered 2021-10-27: 100 mL via INTRAVENOUS

## 2021-10-27 MED ORDER — SODIUM CHLORIDE (PF) 0.9 % IJ SOLN
INTRAMUSCULAR | Status: AC
  Filled 2021-10-27: qty 50

## 2021-10-27 NOTE — Progress Notes (Signed)
? ?Patient Care Team: ?Eugenia Pancoast, FNP as PCP - General (Family Medicine) ?Shawnee Knapp, MD (Family Medicine) ?[name], MD as Consulting Physician (General Surgery) ?Nicholas Lose, MD as Consulting Physician (Hematology and Oncology) ?Eppie Gibson, MD as Attending Physician (Radiation Oncology) ?Jacelyn Pi, MD as Consulting Physician (Endocrinology) ?Lavonia Drafts, MD as Consulting Physician (Obstetrics and Gynecology) ? ?DIAGNOSIS:  ?Encounter Diagnosis  ?Name Primary?  ? Malignant neoplasm of upper-inner quadrant of left breast in female, estrogen receptor positive ([place])   ? ? ?SUMMARY OF ONCOLOGIC HISTORY: ?Oncology History  ?Malignant neoplasm of upper-inner quadrant of left breast in female, estrogen receptor positive ([place])  ?06/05/2020 Initial Diagnosis  ? 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 ?Right breast benign-appearing calcifications previous stereotactic biopsy was attempted but it was too superficial and felt to be benign. ?  ?06/10/2020 Cancer Staging  ? Staging form: Breast, AJCC 8th Edition ?- Clinical stage from 06/10/2020: Stage IIIB (cT4d, cN3c, cM0, G3, ER+, PR-, HER2+) - Signed by Eppie Gibson, MD on 12/22/2020 ?Stage prefix: Initial diagnosis ?Histologic grading system: 3 grade system ?Laterality: Left ?Staged by: Pathologist and managing physician ?Stage used in treatment planning: Yes ?National guidelines used in treatment planning: Yes ?Type of national guideline used in treatment planning: NCCN ? ?  ?06/25/2020 - 10/09/2020 Chemotherapy  ? Neoadjuvant chemotherapy with [place] Perjeta ?6 cycles followed by Herceptin Perjeta maintenance for 1 year ? ? ?  ? ?  ?06/29/2020 Genetic Testing  ? Negative genetic testing: no pathogenic variants detected in [facility] Common Hereditary  Cancers Panel.  The report date is June 29, 2020.  ? ?The Common Hereditary Cancers Panel offered by [facility] includes sequencing and/or deletion duplication testing of the following 48 genes: APC, ATM, AXIN2, BARD1, BMPR1A, BRCA1, BRCA2, BRIP1, CDH1, CDK4, CDKN2A (p14ARF), CDKN2A (p16INK4a), CHEK2, CTNNA1, DICER1, EPCAM (Deletion/duplication testing only), GREM1 (promoter region deletion/duplication testing only), KIT, MEN1, MLH1, MSH2, MSH3, MSH6, MUTYH, NBN, NF1, NHTL1, PALB2, PDGFRA, PMS2, POLD1, POLE, PTEN, RAD50, RAD51C, RAD51D, RNF43, SDHB, SDHC, SDHD, SMAD4, SMARCA4. STK11, TP53, TSC1, TSC2, and VHL.  The following genes were evaluated for sequence changes only: SDHA and HOXB13 c.251G>A variant only. ?  ?10/28/2020 - 06/23/2021 Chemotherapy  ? Patient is on Treatment Plan : BREAST Trastuzumab + Pertuzumab q21d  ?   ?11/09/2020 Surgery  ? Right lumpectomy and left mastectomy [name]):  ?Right breast: no evidence of malignancy ?Left breast: no residual carcinoma, with 19 left axillary lymph nodes negative for carcinoma. ?  ?11/09/2020 Cancer Staging  ? Staging form: Breast, AJCC 8th Edition ?- Pathologic stage from 11/09/2020: No Stage Recommended (ypT0, pN0, cM0) - Signed by [name], NP on 08/23/2021 ?Stage prefix: Post-therapy ? ?  ?12/29/2020 - 02/09/2021 Radiation Therapy  ? Adjuvant radiation ?12/29/2020 through 02/09/2021 ?Site Technique Total Dose (Gy) Dose per Fx (Gy) Completed Fx Beam Energies  ?Chest Wall, Left: CW_Lt_IMN 3D 50/50 2 25/25 6X  ?Chest Wall, Left: CW_Lt_SCV_PAB 3D 50/50 2 25/25 6X, 15X  ?Chest Wall, Left: CW_Lt_Bst Electron 10/10 2 5/5 6E  ?  ?04/2021 -  Anti-estrogen oral therapy  ? Letrozole daily ?  ? ? ?CHIEF COMPLIANT: Follow-up on letrozole therapy ? ?INTERVAL HISTORY: Robin Kennedy is a 46 y.o. with above-mentioned history of  inflammatory left breast cancer who completed neoadjuvant chemotherapy, right lumpectomy, left mastectomy,  radiation therapy, and completed  Herceptin Perjeta maintenance. She presents to the clinic today for follow-up on letrozole therapy.  She is tolerating letrozole extremely well. ? ? ?ALLERGIES:  is allergic to metformin hcl. ? ?MEDICATIONS:  ?Current Outpatient Medications  ?Medication Sig Dispense Refill  ? BD PEN NEEDLE NANO 2ND GEN 32G X 4 MM MISC USE 4 TIMES A DAY AS DIRECTED    ? calcium-vitamin D (OSCAL WITH D) 500MG-200UNIT (5MCG) tablet Take 1 tablet by mouth daily with breakfast.    ? Continuous Blood Gluc Sensor (FREESTYLE LIBRE 2 SENSOR) MISC Inject into the skin every 14 (fourteen) days.    ? empagliflozin (JARDIANCE) 10 MG TABS tablet Take 1 tablet (10 mg total) by mouth daily before breakfast. 30 tablet   ? glucose blood (ONE TOUCH ULTRA TEST) test strip Use to check cbgs qd 100 each 11  ? insulin aspart (NOVOLOG FLEXPEN) 100 UNIT/ML FlexPen 3-5u    ? letrozole (FEMARA) 2.5 MG tablet Take 1 tablet (2.5 mg total) by mouth daily. 90 tablet 3  ? TRESIBA FLEXTOUCH 100 UNIT/ML FlexTouch Pen Inject 22 Units into the skin daily after breakfast.    ? ?No current facility-administered medications for this visit.  ? ? ?PHYSICAL EXAMINATION: ?ECOG PERFORMANCE STATUS: 1 - Symptomatic but completely ambulatory ? ?Vitals:  ? 10/29/21 1010  ?BP: (!) 152/88  ?Pulse: 74  ?Resp: 16  ?Temp: (!) 97 ?F (36.1 ?C)  ?SpO2: 100%  ? ?Filed Weights  ? 10/29/21 1010  ?Weight: 202 lb 6.4 oz (91.8 kg)  ?  ? ?LABORATORY DATA:  ?I have reviewed the data as listed ?CMP Latest Ref Rng & Units 10/27/2021 09/15/2021 06/23/2021  ?Glucose 70 - 99 mg/dL 254(H) 167(H) 193(H)  ?BUN 6 - 20 mg/dL 19 30(H) 22(H)  ?Creatinine 0.44 - 1.00 mg/dL 0.86 0.87 0.98  ?Sodium 135 - 145 mmol/L 143 139 142  ?Potassium 3.5 - 5.1 mmol/L 3.8 4.5 3.9  ?Chloride 98 - 111 mmol/L 106 107 109  ?CO2 22 - 32 mmol/L '30 25 23  ' ?Calcium 8.9 - 10.3 mg/dL 10.6(H) 10.0 9.5  ?Total Protein 6.5 - 8.1 g/dL 7.3 6.9 6.9  ?Total Bilirubin 0.3 - 1.2 mg/dL 0.5 0.4 0.3  ?Alkaline Phos 38 - 126 U/L 51 47 48  ?AST 15 -  41 U/L 9(L) 10 9(L)  ?ALT 0 - 44 U/L '11 13 10  ' ? ? ?Lab Results  ?Component Value Date  ? WBC 5.2 10/27/2021  ? HGB 11.8 (L) 10/27/2021  ? HCT 37.0 10/27/2021  ? MCV 82.0 10/27/2021  ? PLT 236 10/27/2021  ? NEUTROABS 3.0 10/27/2021  ? ? ?ASSESSMENT & PLAN:  ?Malignant neoplasm of upper-inner quadrant of left breast in female, estrogen receptor positive ([place]) ?06/05/2020: Enlarging left breast with pain and diffuse skin thickening: Mammogram revealed 3.5 cm mass 11 o'clock position with 3 abnormal lymph nodes: 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 ?Right breast benign-appearing calcifications previous stereotactic biopsy was attempted but it was too superficial and felt to be benign. ?  ?Recommendation based on multidisciplinary tumor board: ?1. Neoadjuvant chemotherapy with [place] ?6 cycles followed by Herceptin Perjeta maintenance for 1 year completed 06/23/2021 ?2. 11/09/20: Right lumpectomy and left mastectomy [name]): Right breast: no evidence of malignancy ?Left breast: no residual carcinoma, with 19 left axillary lymph nodes negative for carcinoma. ?3. Followed by adjuvant radiation therapy (due to inflammatory breast changes at diagnosis) completed 02/09/2021 ?4.  Antiestrogen  therapy with letrozole started July 2022 ?------------------------------------------------------------------------------------------------------------------------------------------ ? ?Current treatment: Letrozole 2.5 mg daily ?  ?PET CT scan 04/28/2021: No evidence of metastatic disease.  Postsurgical changes. ? ?Letrozole toxicities: No hot flashes or muscle pains ?Taking calcium and Vit D ? ?Breast cancer surveillance:  ?1. Mammograms 10/06/2021: Normal appearance of right breast.  Left mastectomy, breast density category B ?2. breast exam 10/29/2021: Benign ?3.  CT CAP 10/27/2021: No evidence of metastatic disease within the chest abdomen pelvis.  Postradiation changes.  Gastritis ? ?Return  to clinic in 1 year for follow-up ? ? ? ?No orders of the defined types were placed in this encounter. ? ?The patient has a good understanding of the overall plan. she agrees with it. she will call w

## 2021-10-29 ENCOUNTER — Other Ambulatory Visit: Payer: Self-pay

## 2021-10-29 ENCOUNTER — Inpatient Hospital Stay (HOSPITAL_BASED_OUTPATIENT_CLINIC_OR_DEPARTMENT_OTHER): Payer: 59 | Admitting: Hematology and Oncology

## 2021-10-29 DIAGNOSIS — C50212 Malignant neoplasm of upper-inner quadrant of left female breast: Secondary | ICD-10-CM | POA: Diagnosis present

## 2021-10-29 DIAGNOSIS — Z170 Estrogen receptor positive status [ER+]: Secondary | ICD-10-CM | POA: Diagnosis present

## 2021-10-29 DIAGNOSIS — Z9013 Acquired absence of bilateral breasts and nipples: Secondary | ICD-10-CM | POA: Diagnosis not present

## 2021-10-29 DIAGNOSIS — Z79811 Long term (current) use of aromatase inhibitors: Secondary | ICD-10-CM | POA: Diagnosis not present

## 2021-10-29 DIAGNOSIS — Z923 Personal history of irradiation: Secondary | ICD-10-CM | POA: Diagnosis not present

## 2021-10-29 NOTE — Assessment & Plan Note (Signed)
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 ?Right breast benign-appearing calcifications previous stereotactic biopsy was attempted but it was too superficial and felt to be benign. ?? ?Recommendation?based on multidisciplinary tumor board: ?1. Neoadjuvant chemotherapy with [place] ?6 cycles followed by Herceptin Perjeta maintenance for 1 year completed 06/23/2021 ?2.?11/09/20:?Right lumpectomy and left mastectomy [name]): Right breast: no evidence of malignancy ?Left breast: no residual carcinoma, with 19 left axillary lymph nodes negative for carcinoma. ?3. Followed by adjuvant radiation therapy?(due to inflammatory breast changes at diagnosis) completed 02/09/2021 ?4.  Antiestrogen therapy with letrozole started July 2022 ?------------------------------------------------------------------------------------------------------------------------------------------ ? ?Current treatment: Letrozole 2.5 mg daily ?? ?PET CT scan 04/28/2021: No evidence of metastatic disease. ?Postsurgical changes. ? ?Letrozole?toxicities:?No hot flashes or muscle pains ?Taking calcium and Vit D ? ?Breast cancer surveillance:  ?1. Mammograms 10/06/2021: Normal appearance of right breast.  Left mastectomy, breast density category B ?2. breast exam 10/29/2021: Benign ?3.  CT CAP 10/27/2021: No evidence of metastatic disease within the chest abdomen pelvis.  Postradiation changes.  Gastritis ? ?Return to clinic in 1 year for follow-up ?

## 2021-11-22 ENCOUNTER — Ambulatory Visit: Payer: 59 | Attending: General Surgery

## 2021-11-22 VITALS — Wt 205.1 lb

## 2021-11-22 DIAGNOSIS — Z483 Aftercare following surgery for neoplasm: Secondary | ICD-10-CM | POA: Insufficient documentation

## 2021-11-22 NOTE — Therapy (Signed)
?OUTPATIENT PHYSICAL THERAPY [facility]ZO SCREENING NOTE ? ? ?Patient Name: Robin Kennedy ?MRN: [id number] ?DOB:June 29, 1976, 46 y.o., female ?Today's Date: 11/22/2021 ? ?PCP: Eugenia Pancoast, FNP ?REFERRING PROVIDER: [name], MD ? ? PT End of Session - 11/22/21 0844   ? ? Visit Number 16   # unchanged due to screen only  ? PT Start Time [phone]   ? PT Stop Time 2876   ? PT Time Calculation (min) 4 min   ? Activity Tolerance Patient tolerated treatment well   ? Behavior During Therapy [facility] for tasks assessed/performed   ? ?  ?  ? ?  ? ? ?Past Medical History:  ?Diagnosis Date  ? Breast cancer metastasized to axillary lymph node, left ([place]) 11/09/2020  ? Cancer ([place]) 06/05/2020  ? left breast  ? Diabetes mellitus without complication ([place])   ? Fibroids 12/06/2016  ? Malignant neoplasm of upper-inner quadrant of left breast in female, estrogen receptor positive ([place]) 06/05/2020  ? Neuromuscular disorder ([place])   ? 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 Personal history of chemotherapy   ? ended 10-07-2020 and did herceptin thru 06-23-2021  ? Personal history of radiation therapy   ? 01-31-2021  ? SVD (spontaneous vaginal delivery)   ? X 3  ? ?Past Surgical History:  ?Procedure Laterality Date  ? BREAST BIOPSY Left 06/02/2020  ? x2  ? BREAST BIOPSY Right 06/22/2020  ? CYSTOSCOPY N/A 12/20/2016  ? Procedure: CYSTOSCOPY;  Surgeon: Lavonia Drafts, MD;  Location: [facility];  Service: Gynecology;  Laterality: N/A;  ? MODIFIED MASTECTOMY Left 11/09/2020  ? Procedure: LEFT MODIFIED MASTECTOMY WITH LYMPH NODE DISSECTION;  Surgeon: [name], MD;  Location: [facility];  Service: General;  Laterality: Left;  ? PORT-A-CATH REMOVAL Left 06/24/2021  ? Procedure: REMOVAL PORT-A-CATH;  Surgeon: [name], MD;  Location: [facility];  Service: General;  Laterality: Left;  ? PORTACATH PLACEMENT Left 06/23/2020  ? Procedure: INSERTION PORT-A-CATH WITH ULTRASOUND GUIDANCE;  Surgeon: [name], MD;  Location:  [place];  Service: General;  Laterality: Left;  ? RADIOACTIVE SEED GUIDED EXCISIONAL BREAST BIOPSY Right 11/09/2020  ? Procedure: RIGHT BREAST SEED LOCALIZED EXCISIONAL BIOPSY;  Surgeon: [name], MD;  Location: [facility];  Service: General;  Laterality: Right;  ? ROBOTIC ASSISTED TOTAL HYSTERECTOMY WITH BILATERAL SALPINGO OOPHERECTOMY Bilateral 12/20/2016  ? Procedure: ROBOTIC ASSISTED TOTAL HYSTERECTOMY WITH BILATERAL SALPINGO OOPHORECTOMY;  Surgeon: Lavonia Drafts, MD;  Location: [facility];  Service: Gynecology;  Laterality: Bilateral;  ? tooth removal  09/2021  ? TUBAL LIGATION    ? WISDOM TOOTH EXTRACTION    ? ?Patient Active Problem List  ? Diagnosis Date Noted  ? Obesity due to excess calories 09/01/2021  ? 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 Hormone receptor positive breast cancer, left ([place]) 11/09/2020  ? Screening for lipoid disorders 06/29/2020  ? Malignant neoplasm of upper-inner quadrant of left breast in female, estrogen receptor positive ([place]) 06/05/2020  ? Controlled type 2 diabetes mellitus without complication, with long-term current use of insulin ([place]) 06/19/2012  ? ? ?REFERRING DIAG: right breast cancer at risk for lymphedema ? ?THERAPY DIAG:  ?Aftercare following surgery for neoplasm ? 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 It is ER positive, PR negative, and HER2 positive with a Ki67 of 60%. She underwent neoadjuvant chemotherapy 06/25/2020-10/07/2020 followed by a right lumpectomy and a left mastectomy with an axillary lymph node dissection (19 negative nodes removed)  on 11/09/2020. She has type II diabetes. ? ?PRECAUTIONS: right UE Lymphedema risk, None ? ?SUBJECTIVE: Pt returns ? ?PAIN:  ?Are you having pain? No ? ?SOZO SCREENING: ?Patient was assessed today using the SOZO machine to determine the lymphedema index score. This was compared to her baseline score. It was  determined that she is within the recommended range when compared to her baseline and no further action is needed at this time. She will continue SOZO screenings. These are done every 3 months for 2 years post operatively followed by every 6 months for 2 years, and then annually. ? ? ? ? ?Otelia Limes, PTA ?11/22/2021, 8:50 AM ? ?  ? ?

## 2021-12-29 ENCOUNTER — Encounter (HOSPITAL_COMMUNITY): Payer: Self-pay

## 2022-02-07 ENCOUNTER — Ambulatory Visit (HOSPITAL_BASED_OUTPATIENT_CLINIC_OR_DEPARTMENT_OTHER)
Admission: RE | Admit: 2022-02-07 | Discharge: 2022-02-07 | Disposition: A | Discharge status: Home / Self Care | Payer: 59 | Source: Ambulatory Visit | Attending: Adult Health | Admitting: Adult Health

## 2022-02-07 DIAGNOSIS — E2839 Other primary ovarian failure: Secondary | ICD-10-CM | POA: Insufficient documentation

## 2022-02-08 ENCOUNTER — Telehealth: Payer: Self-pay | Admitting: *Deleted

## 2022-03-22 ENCOUNTER — Ambulatory Visit (INDEPENDENT_AMBULATORY_CARE_PROVIDER_SITE_OTHER): Payer: 59 | Admitting: Family

## 2022-03-22 ENCOUNTER — Encounter: Payer: Self-pay | Admitting: Family

## 2022-03-22 VITALS — BP 130/80 | HR 83 | Temp 98.3°F | Resp 16 | Ht 68.0 in | Wt 214.5 lb

## 2022-03-22 DIAGNOSIS — E7800 Pure hypercholesterolemia, unspecified: Secondary | ICD-10-CM

## 2022-03-22 DIAGNOSIS — E119 Type 2 diabetes mellitus without complications: Secondary | ICD-10-CM

## 2022-03-22 DIAGNOSIS — N6332 Unspecified lump in axillary tail of the left breast: Secondary | ICD-10-CM

## 2022-03-22 DIAGNOSIS — S61211A Laceration without foreign body of left index finger without damage to nail, initial encounter: Secondary | ICD-10-CM | POA: Diagnosis not present

## 2022-03-22 DIAGNOSIS — Z794 Long term (current) use of insulin: Secondary | ICD-10-CM

## 2022-03-22 DIAGNOSIS — L03112 Cellulitis of left axilla: Secondary | ICD-10-CM | POA: Diagnosis not present

## 2022-03-22 DIAGNOSIS — C50912 Malignant neoplasm of unspecified site of left female breast: Secondary | ICD-10-CM

## 2022-03-22 MED ORDER — MUPIROCIN 2 % EX OINT: 1.0000 | TOPICAL_OINTMENT | Freq: Two times a day (BID) | CUTANEOUS | 0 refills | Status: AC

## 2022-03-22 MED ORDER — AMOXICILLIN-POT CLAVULANATE 875-125 MG PO TABS: 1.0000 | ORAL_TABLET | Freq: Two times a day (BID) | ORAL | 0 refills | Status: AC

## 2022-03-22 NOTE — Assessment & Plan Note (Signed)
rx augmentin 875/125 mg po bid x 10 days Also sent in mupirocin ointment to apply to finger  Please monitor site for worsening signs/symptoms of infection to include: increasing redness, increasing tenderness, increase in size, and or pustulant drainage from site. If this is to occur please let me know immediately.

## 2022-03-22 NOTE — Assessment & Plan Note (Signed)
Continue f/u with endo as scheduled Continue medications as prescribed

## 2022-03-22 NOTE — Assessment & Plan Note (Signed)
In remission  Continue f/u with oncologist prn

## 2022-03-22 NOTE — Patient Instructions (Signed)
I have sent an electronic order over to your preferred location for the following:   Ultrasound left breast axilla  Please give this center a call to get scheduled at your convenience.   '[x]'$   [facility]      [place], [place]         Make sure to wear two piece  clothing  No lotions powders or deodorants the day of the appointment Make sure to bring picture ID and insurance card.  Bring list of medications you are currently taking including any supplements.   Due to recent changes in healthcare laws, you may see results of your imaging and/or laboratory studies on MyChart before I have had a chance to review them.  I understand that in some cases there may be results that are confusing or concerning to you. Please understand that not all results are received at the same time and often I may need to interpret multiple results in order to provide you with the best plan of care or course of treatment. Therefore, I ask that you please give me 2 business days to thoroughly review all your results before contacting my office for clarification. Should we see a critical lab result, you will be contacted sooner.   It was a pleasure seeing you today! Please do not hesitate to reach out with any questions and or concerns.  Regards,   Eugenia Pancoast FNP-C

## 2022-03-22 NOTE — Progress Notes (Signed)
Established Patient Office Visit  Subjective:  Patient ID: Robin Kennedy, female    DOB: 1976-08-04  Age: 46 y.o. MRN: [id number]  CC:  Chief Complaint  Patient presents with   Diabetes    HPI Robin Kennedy is here today for follow up.   Anemia, stable.  Calcium, slight elevation last visit. Not currently taking calcium and or vitamin D.  Lab Results  Component Value Date   HGBA1C 9.4 (H) 07/18/2018   DM2: sees Dr. Chalmers Cater, visit was two weeks ago. Still working on diabetic diet, wears sensor libre. Last a1c was around 8 or so? Novolog sliding scale, tresiba 100 unit / ml, on jardiance 10 mg  once daily,.she has f/u with her in four months.    Left axillary area, has noticed its a bit more swollen in the last two weeks, no real tenderness. Did have mild tenderness one day which has since resolved. Left mastectomy, breast exam at oncology visit 10/29/21 benign. Normal appearance of right breast per oncology. On one year f/u with oncology.  Second metacarpal with abrasion, hit it on something. Poor healing. Had some pus on it which has since improved now scabbing over slightly but still tender. Runs at times with yellowish discharge sometimes clear.   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   Personal history of chemotherapy    ended 10-07-2020 and did herceptin thru 06-23-2021   Personal history of radiation therapy     01-31-2021   SVD (spontaneous vaginal delivery)    X 3    Past Surgical History:  Procedure Laterality Date   BREAST BIOPSY Left 06/02/2020   x2   BREAST BIOPSY Right 06/22/2020   CYSTOSCOPY N/A 12/20/2016   Procedure: CYSTOSCOPY;  Surgeon: Lavonia Drafts, MD;  Location: [facility];  Service: Gynecology;  Laterality: N/A;   MODIFIED MASTECTOMY Left 11/09/2020   Procedure: LEFT MODIFIED MASTECTOMY WITH LYMPH NODE DISSECTION;  Surgeon: [name], MD;  Location: [facility];  Service: General;  Laterality: Left;   PORT-A-CATH REMOVAL Left 06/24/2021   Procedure: REMOVAL PORT-A-CATH;  Surgeon: [name], MD;  Location: [facility];  Service: General;  Laterality: Left;   PORTACATH PLACEMENT Left 06/23/2020   Procedure: INSERTION PORT-A-CATH WITH ULTRASOUND GUIDANCE;  Surgeon: [name], MD;  Location: [facility];  Service: General;  Laterality: Left;   RADIOACTIVE SEED GUIDED EXCISIONAL BREAST BIOPSY Right 11/09/2020   Procedure: RIGHT BREAST SEED LOCALIZED EXCISIONAL BIOPSY;  Surgeon: [name], MD;  Location: [facility];  Service: General;  Laterality: Right;   ROBOTIC ASSISTED TOTAL HYSTERECTOMY WITH BILATERAL SALPINGO OOPHERECTOMY Bilateral 12/20/2016   Procedure: ROBOTIC ASSISTED TOTAL HYSTERECTOMY WITH BILATERAL SALPINGO OOPHORECTOMY;  Surgeon: Lavonia Drafts, MD;  Location: [facility];  Service: Gynecology;  Laterality: Bilateral;   tooth removal  09/2021   TUBAL LIGATION     WISDOM TOOTH EXTRACTION  Family History  Problem Relation Age of Onset   Sarcoidosis Mother    Colon polyps Brother    Diabetes Brother    Hyperlipidemia Maternal Aunt    Stroke Maternal Uncle    Stroke Paternal Grandmother    Breast cancer Paternal Grandmother        unknown age   Asthma Daughter    GER disease Daughter    Colon cancer Neg Hx    Esophageal cancer Neg Hx    Rectal cancer Neg Hx    Stomach cancer Neg Hx     Social  History   Socioeconomic History   Marital status: Married    Spouse name: Not on file   Number of children: Not on file   Years of education: Not on file   Highest education level: Not on file  Occupational History    Comment: part time Doctor, hospital  at [employer]  Tobacco Use   Smoking status: Never   Smokeless tobacco: Never  Vaping Use   Vaping Use: Never used  Substance and Sexual Activity   Alcohol use: No   Drug use: No   Sexual activity: Yes    Partners: Male    Birth control/protection: Surgical  Other Topics Concern   Not on file  Social History Narrative   ** Merged History Encounter **       Social Determinants of Health   Financial Resource Strain: Low Risk  (09/01/2021)   Overall Financial Resource Strain (CARDIA)    Difficulty of Paying Living Expenses: Not hard at all  Food Insecurity: No Food Insecurity (09/01/2021)   Hunger Vital Sign    Worried About Running Out of Food in the Last Year: Never true    Ran Out of Food in the Last Year: Never true  Transportation Needs: No Transportation Needs (09/01/2021)   PRAPARE - Hydrologist (Medical): No    Lack of Transportation (Non-Medical): No  Physical Activity: Sufficiently Active (09/01/2021)   Exercise Vital Sign    Days of Exercise per Week: 4 days    Minutes of Exercise per Session: 60 min  Stress: No Stress Concern Present (09/01/2021)   [place]    Feeling of Stress : Only a little  Social Connections: Socially Integrated (09/01/2021)   Social Connection and Isolation Panel [NHANES]    Frequency of Communication with Friends and Family: More than three times a week    Frequency of Social Gatherings with Friends and Family: Twice a week    Attends Religious Services: More than 4 times per year    Active Member of [employer] or Organizations: Yes    Attends Music therapist: More than 4 times per  year    Marital Status: Married  Human resources officer Violence: Not At Risk (09/01/2021)   Humiliation, Afraid, Rape, and Kick questionnaire    Fear of Current or Ex-Partner: No    Emotionally Abused: No    Physically Abused: No    Sexually Abused: No    Outpatient Medications Prior to Visit  Medication Sig Dispense Refill   BD PEN NEEDLE NANO 2ND GEN 32G X 4 MM MISC USE 4 TIMES A DAY AS DIRECTED     Continuous Blood Gluc Sensor (FREESTYLE LIBRE 2 SENSOR) MISC Inject into the skin every 14 (fourteen) days.     empagliflozin (JARDIANCE) 10 MG TABS tablet Take 1 tablet (10 mg total) by mouth daily before  breakfast. 30 tablet    glucose blood (ONE TOUCH ULTRA TEST) test strip Use to check cbgs qd 100 each 11   insulin aspart (NOVOLOG FLEXPEN) 100 UNIT/ML FlexPen 3-5u     letrozole (FEMARA) 2.5 MG tablet Take 1 tablet (2.5 mg total) by mouth daily. 90 tablet 3   TRESIBA FLEXTOUCH 100 UNIT/ML FlexTouch Pen Inject 22 Units into the skin daily after breakfast.     calcium-vitamin D (OSCAL WITH D) '500MG'$ -200UNIT (5MCG) tablet Take 1 tablet by mouth daily with breakfast. (Patient not taking: Reported on 03/22/2022)     No facility-administered medications prior to visit.    Allergies  Allergen Reactions   Metformin Hcl Other (See Comments)    GI upset          Objective:    Physical Exam Constitutional:      General: She is not in acute distress.    Appearance: Normal appearance. She is obese. She is not ill-appearing, toxic-appearing or diaphoretic.  Skin:    Findings: Abrasion (healing abrasion second metacarpal lateral aspect at distal phalynx with slight tenderness, dry scab with mild surrounding erythema) present.          Comments: Left axillary tenderness with mass palpated Left breast with mastectomy no masses palpated  Neurological:     General: No focal deficit present.     Mental Status: She is alert and oriented to person, place, and time. Mental status is at baseline.   Psychiatric:        Mood and Affect: Mood normal.        Behavior: Behavior normal.        Thought Content: Thought content normal.        Judgment: Judgment normal.       BP 130/80   Pulse 83   Temp 98.3 F (36.8 C)   Resp 16   Ht '5\' 8"'$  (1.727 m)   Wt 214 lb 8 oz (97.3 kg)   LMP 11/06/2016 (Exact Date)   SpO2 97%   BMI 32.61 kg/m  Wt Readings from Last 3 Encounters:  03/22/22 214 lb 8 oz (97.3 kg)  11/22/21 205 lb 2 oz (93 kg)  10/29/21 202 lb 6.4 oz (91.8 kg)     Health Maintenance Due  Topic Date Due   URINE MICROALBUMIN  06/06/2013   COVID-19 Vaccine (4 - Booster for Pfizer series) 02/24/2021   HEMOGLOBIN A1C  03/07/2022   INFLUENZA VACCINE  03/15/2022    There are no preventive care reminders to display for this patient.  Lab Results  Component Value Date   TSH 1.64 09/15/2021   Lab Results  Component Value Date   WBC 5.2 10/27/2021   HGB 11.8 (L) 10/27/2021   HCT 37.0 10/27/2021   MCV 82.0 10/27/2021   PLT 236 10/27/2021   Lab Results  Component Value Date   NA 143 10/27/2021   K 3.8 10/27/2021   CO2 30 10/27/2021   GLUCOSE 254 (H) 10/27/2021   BUN 19 10/27/2021   CREATININE 0.86 10/27/2021   BILITOT 0.5 10/27/2021   ALKPHOS 51 10/27/2021   AST 9 (L) 10/27/2021   ALT 11 10/27/2021   PROT 7.3 10/27/2021   ALBUMIN 4.2 10/27/2021   CALCIUM 10.6 (H) 10/27/2021   ANIONGAP 7 10/27/2021   GFR 80.34 09/15/2021   Lab Results  Component Value Date   CHOL 176 09/15/2021   Lab Results  Component Value Date   HDL 60.30 09/15/2021   Lab Results  Component  Value Date   LDLCALC 103 (H) 09/15/2021   Lab Results  Component Value Date   TRIG 65.0 09/15/2021   Lab Results  Component Value Date   CHOLHDL 3 09/15/2021   Lab Results  Component Value Date   HGBA1C 9.4 (H) 07/18/2018      Assessment & Plan:   Problem List Items Addressed This Visit       Endocrine   Controlled type 2 diabetes mellitus without complication, with  long-term current use of insulin ([place])    Continue f/u with endo as scheduled Continue medications as prescribed         Other   Hormone receptor positive breast cancer, left ([place])    In remission  Continue f/u with oncologist prn       Relevant Medications   amoxicillin-clavulanate (AUGMENTIN) 875-125 MG tablet   Mass of axillary tail of left breast    [place] axilla pending results       Relevant Orders   US BREAST LTD UNI LEFT INC AXILLA   Cellulitis of left axilla    Cellulitis vs mass,  [place] ordered pending results  augmentin as well to see if improvement.  Heat to site as tolerated.       Relevant Medications   amoxicillin-clavulanate (AUGMENTIN) 875-125 MG tablet   Laceration of index finger of left hand without complication - Primary    rx augmentin 875/125 mg po bid x 10 days Also sent in mupirocin ointment to apply to finger  Please monitor site for worsening signs/symptoms of infection to include: increasing redness, increasing tenderness, increase in size, and or pustulant drainage from site. If this is to occur please let me know immediately.        Relevant Medications   mupirocin ointment (BACTROBAN) 2 %   Elevated LDL cholesterol level    Work on low cholesterol diet and exercise as tolerated        Meds ordered this encounter  Medications   mupirocin ointment (BACTROBAN) 2 %    Sig: Apply 1 Application topically 2 (two) times daily for 10 days.    Dispense:  20 g    Refill:  0    Order Specific Question:   Supervising Provider    Answer:   BEDSOLE, AMY E [[id number]]   amoxicillin-clavulanate (AUGMENTIN) 875-125 MG tablet    Sig: Take 1 tablet by mouth 2 (two) times daily for 10 days.    Dispense:  20 tablet    Refill:  0    Order Specific Question:   Supervising Provider    Answer:   Diona Browner, AMY E [[id number]]    Follow-up: Return in about 6 months (around 09/22/2022) for regular follow up appt .    Eugenia Pancoast, FNP

## 2022-03-22 NOTE — Assessment & Plan Note (Signed)
[place] axilla pending results

## 2022-03-22 NOTE — Assessment & Plan Note (Signed)
Cellulitis vs mass,  [place] ordered pending results  augmentin as well to see if improvement.  Heat to site as tolerated.

## 2022-03-22 NOTE — Assessment & Plan Note (Signed)
Work on low cholesterol diet and exercise as tolerated  

## 2022-03-23 ENCOUNTER — Other Ambulatory Visit: Payer: Self-pay | Admitting: Family

## 2022-03-23 ENCOUNTER — Ambulatory Visit
Admission: RE | Admit: 2022-03-23 | Discharge: 2022-03-23 | Disposition: A | Discharge status: Home / Self Care | Payer: 59 | Source: Ambulatory Visit | Attending: Family | Admitting: Family

## 2022-03-23 DIAGNOSIS — N6332 Unspecified lump in axillary tail of the left breast: Secondary | ICD-10-CM

## 2022-03-28 ENCOUNTER — Ambulatory Visit: Payer: 59 | Attending: General Surgery

## 2022-03-28 VITALS — Wt 213.5 lb

## 2022-03-28 DIAGNOSIS — Z483 Aftercare following surgery for neoplasm: Secondary | ICD-10-CM | POA: Insufficient documentation

## 2022-03-28 NOTE — Therapy (Signed)
OUTPATIENT PHYSICAL THERAPY SOZO SCREENING NOTE   Patient Name: Robin Kennedy MRN: [id number] DOB:1976-01-02, 46 y.o., female Today's Date: 03/28/2022  PCP: Eugenia Pancoast, FNP REFERRING PROVIDER: [name], MD   PT End of Session - 03/28/22 0827     Visit Number 16   # ucnhanged due to screen only   PT Start Time 0824    PT Stop Time 0833    PT Time Calculation (min) 9 min    Activity Tolerance Patient tolerated treatment well    Behavior During Therapy [facility] for tasks assessed/performed             Past Medical History:  Diagnosis Date   Breast cancer metastasized to axillary lymph node, left ([place]) 11/09/2020   Cancer ([place]) 06/05/2020   left breast   Diabetes mellitus without complication ([place])    Fibroids 12/06/2016   Malignant neoplasm of upper-inner quadrant of left breast in female, estrogen receptor positive ([place]) 06/05/2020   Neuromuscular disorder ([place])    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   Personal history of chemotherapy    ended 10-07-2020 and did herceptin thru 06-23-2021   Personal history of radiation therapy    01-31-2021   SVD (spontaneous vaginal delivery)    X 3   Past Surgical History:  Procedure Laterality Date   BREAST BIOPSY Left 06/02/2020   x2   BREAST BIOPSY Right 06/22/2020   CYSTOSCOPY N/A 12/20/2016   Procedure: CYSTOSCOPY;  Surgeon: Lavonia Drafts, MD;  Location: [facility];  Service: Gynecology;  Laterality: N/A;   MODIFIED MASTECTOMY Left 11/09/2020   Procedure: LEFT MODIFIED MASTECTOMY WITH LYMPH NODE DISSECTION;  Surgeon: [name], MD;  Location: [facility];  Service: General;  Laterality: Left;   PORT-A-CATH REMOVAL Left 06/24/2021   Procedure: REMOVAL PORT-A-CATH;  Surgeon: [name], MD;  Location: [facility];  Service: General;  Laterality: Left;   PORTACATH PLACEMENT Left 06/23/2020   Procedure: INSERTION PORT-A-CATH WITH ULTRASOUND GUIDANCE;  Surgeon: [name], MD;  Location:  [facility];  Service: General;  Laterality: Left;   RADIOACTIVE SEED GUIDED EXCISIONAL BREAST BIOPSY Right 11/09/2020   Procedure: RIGHT BREAST SEED LOCALIZED EXCISIONAL BIOPSY;  Surgeon: [name], MD;  Location: [facility];  Service: General;  Laterality: Right;   ROBOTIC ASSISTED TOTAL HYSTERECTOMY WITH BILATERAL SALPINGO OOPHERECTOMY Bilateral 12/20/2016   Procedure: ROBOTIC ASSISTED TOTAL HYSTERECTOMY WITH BILATERAL SALPINGO OOPHORECTOMY;  Surgeon: Lavonia Drafts, MD;  Location: [facility];  Service: Gynecology;  Laterality: Bilateral;   tooth removal  09/2021   TUBAL LIGATION     WISDOM TOOTH EXTRACTION     Patient Active Problem List   Diagnosis Date Noted   Mass of axillary tail of left breast 03/22/2022   Cellulitis of left axilla 03/22/2022   Laceration of index finger of left hand without complication 40/98/1191   Elevated LDL cholesterol level 03/22/2022   Obesity due to excess calories 09/01/2021   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 It is ER positive, PR negative, and HER2 positive  with a Ki67 of 60%. She underwent neoadjuvant chemotherapy 06/25/2020-10/07/2020 followed by a right lumpectomy and a left mastectomy with an axillary lymph node dissection (19 negative nodes removed) on 11/09/2020. She has type II diabetes.  PRECAUTIONS: right UE Lymphedema risk, None  SUBJECTIVE: Pt returns for her 3 month L-Dex screen.    PAIN:  Are you having pain? No  SOZO SCREENING: Patient was assessed today using the SOZO machine to determine the lymphedema index score. This was compared to her baseline score. It was determined that she is within the recommended range when compared to her baseline and no further action is needed at this time. She will continue SOZO screenings. These are done every 3 months for 2 years post operatively followed by every 6 months for 2 years, and then annually. Also advised pt to get a compression bra from [facility] [employer] [facility] as she reports she has been noticing some chest swelling since returning to work baking cakes. Info was given for her to call them and reach out to nurse navigator for a script. Pt verbalized good understanding.    L-DEX FLOWSHEETS - 03/28/22 0800       L-DEX LYMPHEDEMA SCREENING   Measurement Type Unilateral    L-DEX MEASUREMENT EXTREMITY Upper Extremity    POSITION  Standing    DOMINANT SIDE Right    At Risk Side Left    BASELINE SCORE (UNILATERAL) 4.7    L-DEX SCORE (UNILATERAL) 2.1    VALUE CHANGE (UNILAT) -2.6               Otelia Limes, PTA 03/28/2022, 8:33 AM

## 2022-03-31 IMAGING — CT CT CHEST-ABD-PELV W/ CM
2 of 5 series · 13 of 36 positions shown, 15 images · IV contrast (OMNIPAQUE)
Comparison: Priors including most recent PET-CT April 28, 2021

CLINICAL DATA: Breast cancer, metastatic, restaging.

EXAM:
CT CHEST, ABDOMEN, AND PELVIS WITH CONTRAST
TECHNIQUE: Multidetector CT imaging of the chest, abdomen and pelvis was
performed following the standard protocol during bolus
administration of intravenous contrast.

[Series 2: cap with · axial · 0.93mm/px · z∈[-653,-113]mm · 10 of 133 slices shown, 12 images]
[im 13/133  mediastinal]
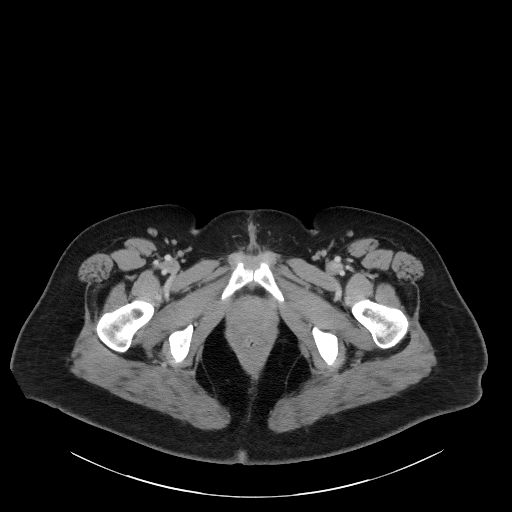
[im 13/133  bone]
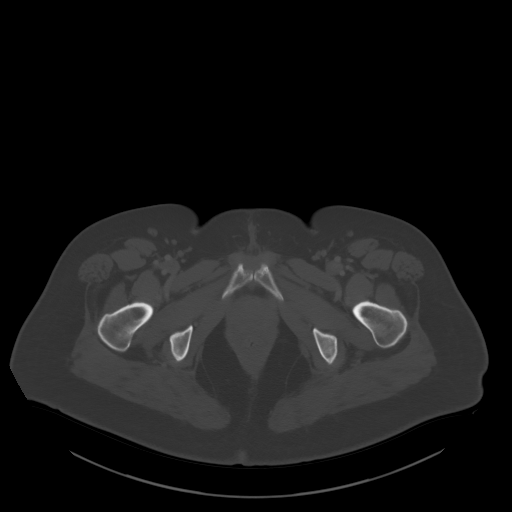
[im 25/133  mediastinal]
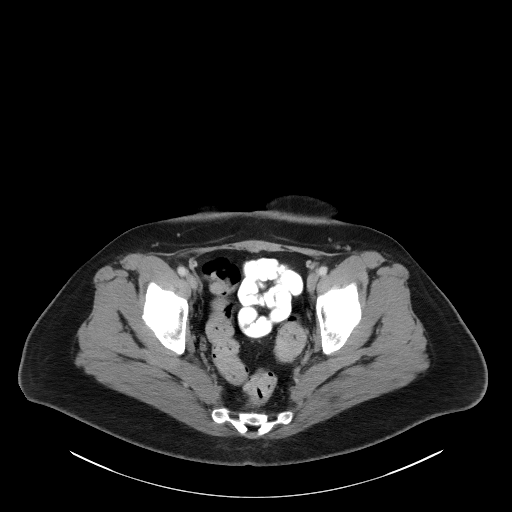
[im 37/133  mediastinal]
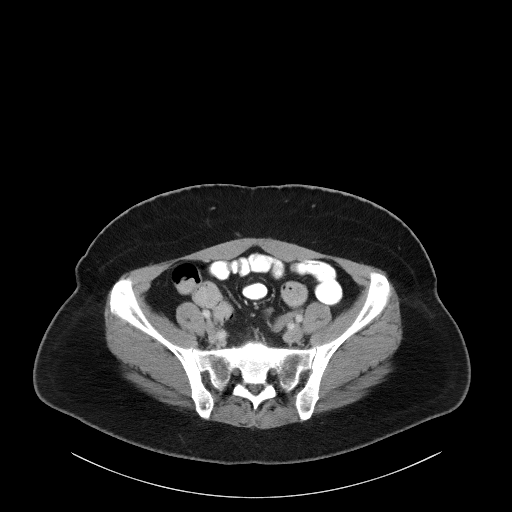
[im 49/133  mediastinal]
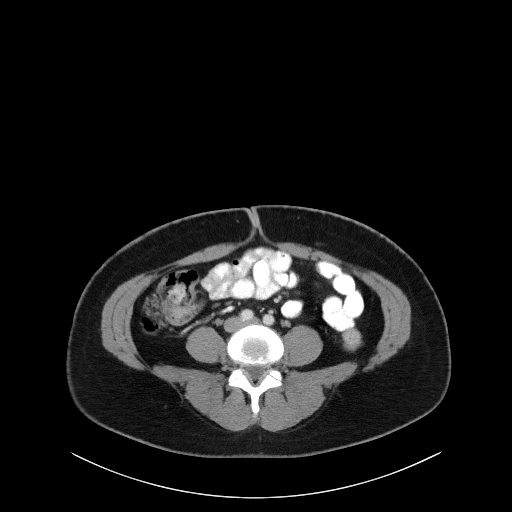
[im 61/133  mediastinal]
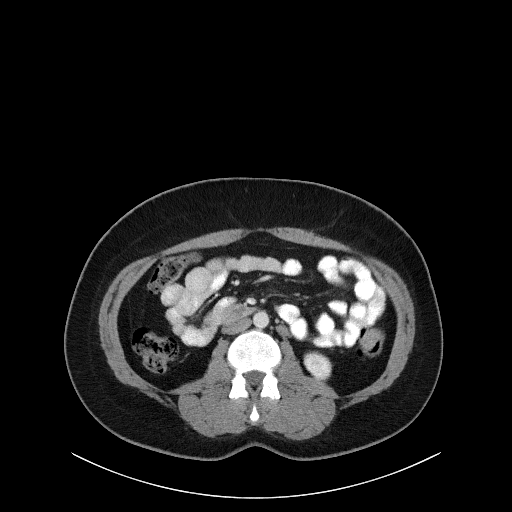
[im 73/133  mediastinal]
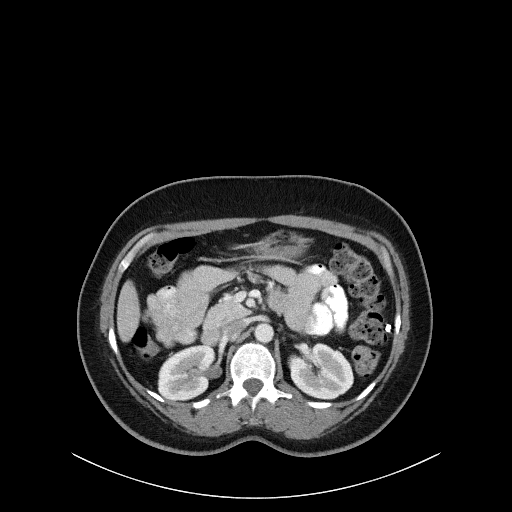
[im 85/133  mediastinal]
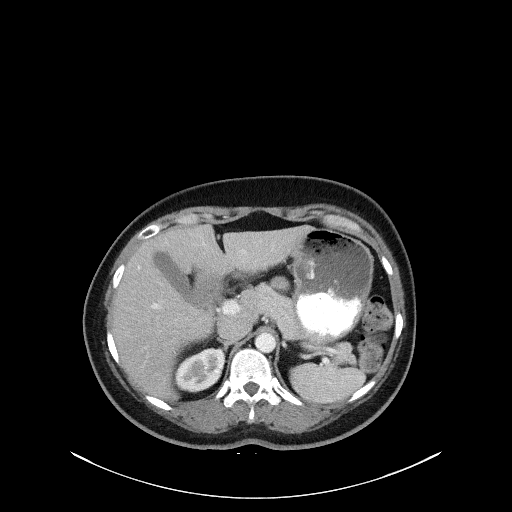
[im 97/133  mediastinal]
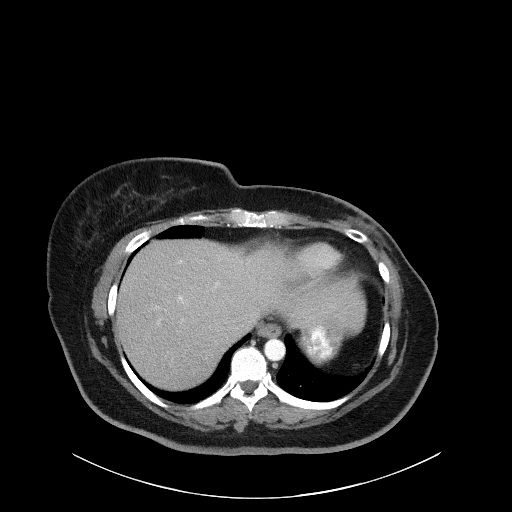
[im 109/133  mediastinal]
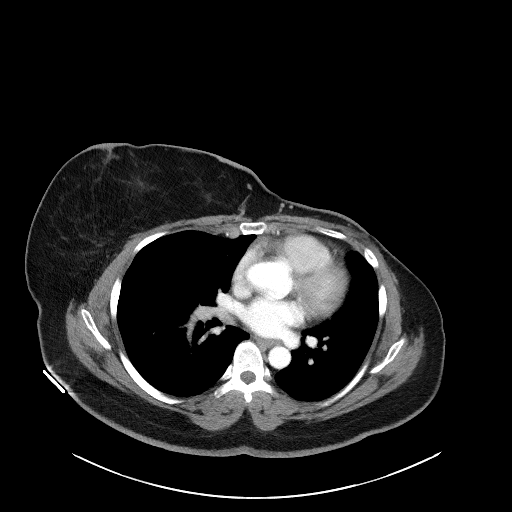
[im 109/133  bone]
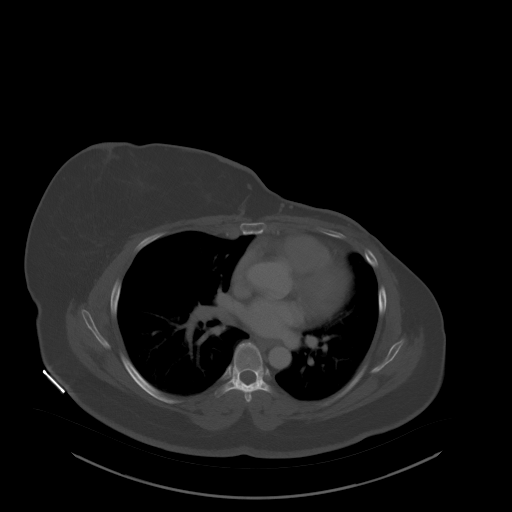
[im 121/133  mediastinal]
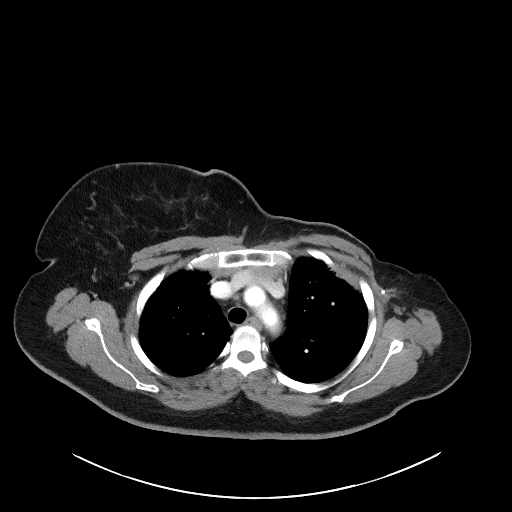

[Series 5: coronals · coronal · 0.79mm/px · 3 of 154 slices shown]
[im 31/154  mediastinal]
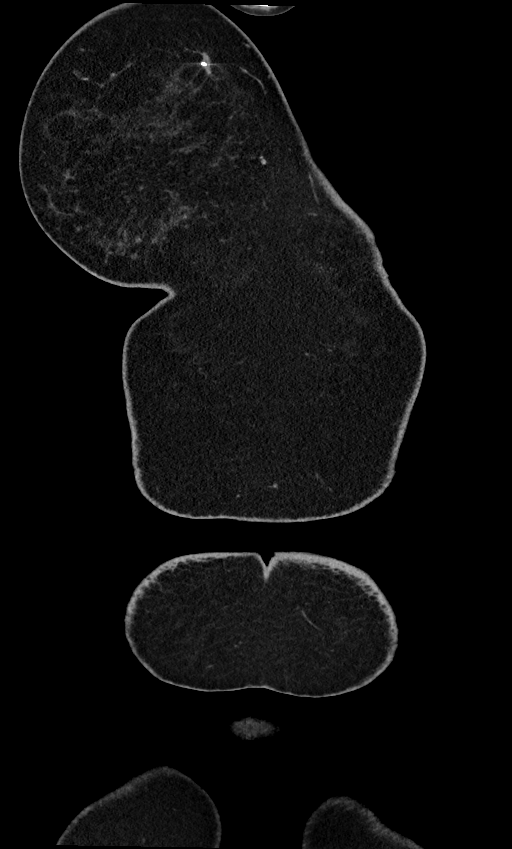
[im 62/154  mediastinal]
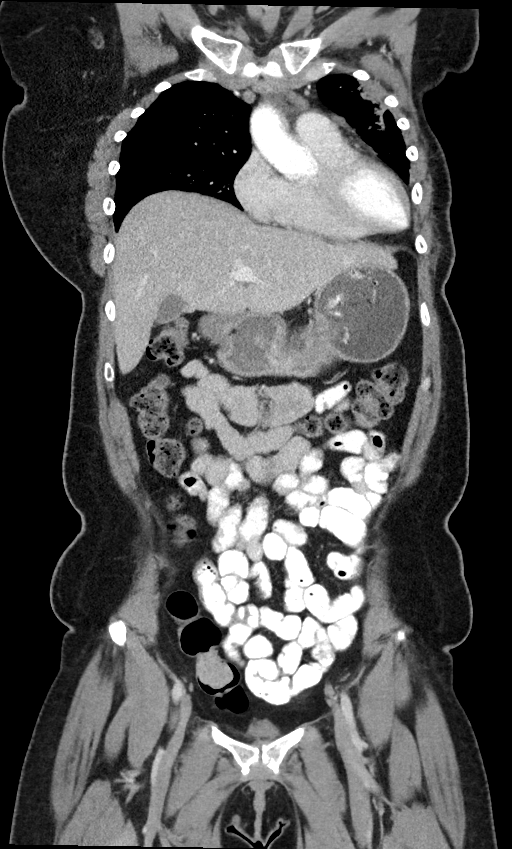
[im 92/154  mediastinal]
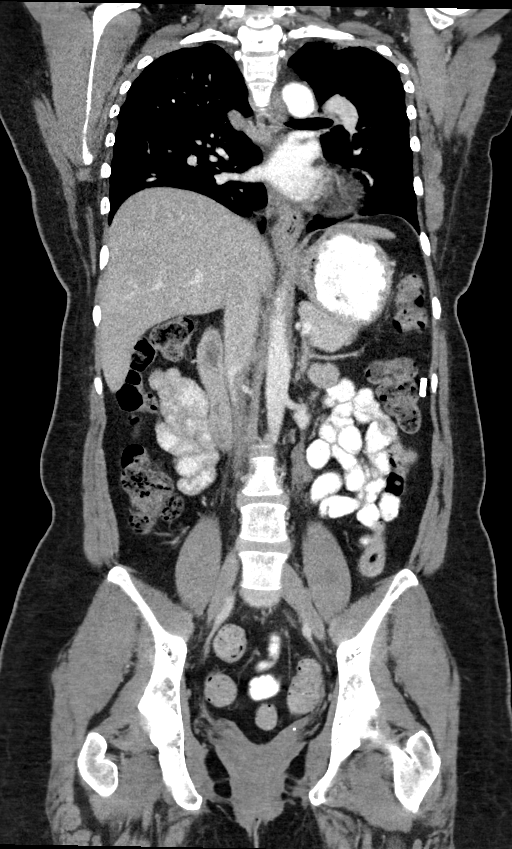

[13 of 36 positions shown; findings below may reference images not displayed]

RADIATION DOSE REDUCTION: This exam was performed according to the
departmental dose-optimization program which includes automated
exposure control, adjustment of the mA and/or kV according to
patient size and/or use of iterative reconstruction technique.

CONTRAST:  100mL OMNIPAQUE IOHEXOL 300 MG/ML  SOLN
FINDINGS: CT CHEST FINDINGS

Cardiovascular: Normal caliber thoracic aorta. No central pulmonary
embolus on this nondedicated study. Normal size heart. Trace
pericardial effusion is within physiologic normal limits.

Mediastinum/Nodes: No supraclavicular adenopathy. No discrete
thyroid nodule. No pathologically enlarged mediastinal, hilar or
axillary lymph nodes. Surgical clips in the left axilla. The
esophagus is grossly unremarkable.

Lungs/Pleura: Evolving post radiation change in the anterior and
apical left upper lobe. No suspicious pulmonary nodules or masses.
No pleural effusion. No pneumothorax.

Musculoskeletal: No aggressive lytic or blastic lesion of bone.
Postsurgical change of left mastectomy.

CT ABDOMEN PELVIS FINDINGS

Hepatobiliary: No suspicious hepatic lesion. Gallbladder is
unremarkable. No biliary ductal dilation.

Pancreas: No pancreatic ductal dilation or evidence of acute
inflammation.

Spleen: No splenomegaly or focal splenic lesion.

Adrenals/Urinary Tract: Bilateral adrenal glands appear normal. No
hydronephrosis. Kidneys demonstrate symmetric enhancement and
excretion of contrast material. Urinary bladder is unremarkable for
degree of distension.

Stomach/Bowel: Radiopaque enteric contrast material traverses the
ileocecal valve. Mild wall thickening versus underdistention of the
gastric antrum. No pathologic dilation of small or large bowel. The
appendix and terminal ileum appear normal. Moderate volume of formed
stool throughout the colon suggestive of constipation.

Vascular/Lymphatic: Normal caliber abdominal aorta. No
pathologically enlarged abdominal or pelvic lymph nodes.

Reproductive: Status post hysterectomy. No adnexal masses.

Other: No significant abdominopelvic free fluid.

Musculoskeletal: No aggressive lytic or blastic lesion of bone.
IMPRESSION: 1. No evidence of metastatic disease within the chest, abdomen, or
pelvis.
2. Evolving post radiation change in the anterior and apical left
upper lobe.
3. Mild wall thickening versus underdistention of the gastric
antrum. Correlate for gastritis.
4. Moderate volume of formed stool throughout the colon suggestive
of constipation.

## 2022-04-07 ENCOUNTER — Other Ambulatory Visit: Payer: Self-pay | Admitting: Hematology and Oncology

## 2022-04-07 ENCOUNTER — Encounter: Payer: Self-pay | Admitting: Hematology and Oncology

## 2022-04-12 ENCOUNTER — Ambulatory Visit (INDEPENDENT_AMBULATORY_CARE_PROVIDER_SITE_OTHER): Payer: 59 | Admitting: Family

## 2022-04-12 ENCOUNTER — Encounter: Payer: Self-pay | Admitting: Family

## 2022-04-12 VITALS — BP 122/76 | HR 70 | Temp 98.6°F | Resp 16 | Ht 68.0 in | Wt 216.4 lb

## 2022-04-12 DIAGNOSIS — L720 Epidermal cyst: Secondary | ICD-10-CM | POA: Diagnosis not present

## 2022-04-12 NOTE — Assessment & Plan Note (Signed)
Handout given to pt  Suspected epidermoid cyst Please monitor site for worsening signs/symptoms of infection to include: increasing redness, increasing tenderness, increase in size, and or pustulant drainage from site. If this is to occur please let me know immediately.   If grwoth of cyst, and or tenderness or restriction of rom may consider derm vs general surgery if needed to excise

## 2022-04-12 NOTE — Progress Notes (Signed)
Established Patient Office Visit  Subjective:  Patient ID: Robin Kennedy, female    DOB: 1975/09/08  Age: 46 y.o. MRN: [id number]  CC:  Chief Complaint  Patient presents with  . Hand Pain    Knot on left thumb x 1 week     HPI [name] is here today with concerns.   One week ago noticed knot on left hand, second finger.  Mobile. Was tender, not as much anymore.  No injury or trauma known to site.  No redness or warmth to site.   Past Medical History:  Diagnosis Date  . Breast cancer metastasized to axillary lymph node, left ([place]) 11/09/2020  . Cancer ([place]) 06/05/2020   left breast  . Diabetes mellitus without complication ([place])   . Fibroids 12/06/2016  . Malignant neoplasm of upper-inner quadrant of left breast in female, estrogen receptor positive ([place]) 06/05/2020  . 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 Personal history of chemotherapy    ended 10-07-2020 and did herceptin thru 06-23-2021  . Personal history of radiation therapy    01-31-2021  . SVD (spontaneous vaginal delivery)    X 3    Past Surgical History:  Procedure Laterality Date  . BREAST BIOPSY Left 06/02/2020   x2  . BREAST BIOPSY Right 06/22/2020  . CYSTOSCOPY N/A 12/20/2016   Procedure: CYSTOSCOPY;  Surgeon: Lavonia Drafts, MD;  Location: [facility];  Service: Gynecology;  Laterality: N/A;  . MODIFIED MASTECTOMY Left 11/09/2020   Procedure: LEFT MODIFIED MASTECTOMY WITH LYMPH NODE DISSECTION;  Surgeon: [name], MD;  Location: [facility];  Service: General;  Laterality: Left;  . PORT-A-CATH REMOVAL Left 06/24/2021   Procedure: REMOVAL PORT-A-CATH;  Surgeon: [name], MD;  Location: [facility];  Service: General;  Laterality: Left;  . PORTACATH PLACEMENT Left 06/23/2020   Procedure: INSERTION PORT-A-CATH WITH ULTRASOUND GUIDANCE;  Surgeon: [name], MD;  Location: [facility];  Service: General;   Laterality: Left;  . RADIOACTIVE SEED GUIDED EXCISIONAL BREAST BIOPSY Right 11/09/2020   Procedure: RIGHT BREAST SEED LOCALIZED EXCISIONAL BIOPSY;  Surgeon: [name], MD;  Location: [facility];  Service: General;  Laterality: Right;  . ROBOTIC ASSISTED TOTAL HYSTERECTOMY WITH BILATERAL SALPINGO OOPHERECTOMY Bilateral 12/20/2016   Procedure: ROBOTIC ASSISTED TOTAL HYSTERECTOMY WITH BILATERAL SALPINGO OOPHORECTOMY;  Surgeon: Lavonia Drafts, MD;  Location: [facility];  Service: Gynecology;  Laterality: Bilateral;  . tooth removal  09/2021  . TUBAL LIGATION    . WISDOM TOOTH EXTRACTION      Family History  Problem Relation Age of Onset  . Sarcoidosis Mother   . Colon polyps Brother   . Diabetes Brother   . Hyperlipidemia Maternal Aunt   . Stroke Maternal Uncle   . Stroke Paternal Grandmother   . Breast cancer Paternal Grandmother        unknown age  . Asthma Daughter   . GER disease Daughter   . Colon cancer Neg Hx   . Esophageal cancer Neg Hx   . Rectal cancer Neg Hx   . Stomach cancer Neg Hx     Social History   Socioeconomic History  . Marital status: Married    Spouse name: Not on file  . Number of children: Not on file  . Years of education: Not on file  . Highest education level: Not on file  Occupational History    Comment: part time cake decorator  at [employer]  Use  . Smoking status: Never  . Smokeless tobacco: Never  Vaping Use  . Vaping Use: Never used  Substance and Sexual Activity  . Alcohol use: No  . Drug use: No  . Sexual activity: Yes    Partners: Male    Birth control/protection: Surgical  Other Topics Concern  . Not on file  Social History Narrative   ** Merged History Encounter **       Social Determinants of Health   Financial Resource Strain: Low Risk  (09/01/2021)   Overall Financial Resource Strain (CARDIA)   . Difficulty of Paying Living Expenses: Not hard at all  Food Insecurity: No Food Insecurity  (09/01/2021)   Hunger Vital Sign   . Worried About Charity fundraiser in the Last Year: Never true   . Ran Out of Food in the Last Year: Never true  Transportation Needs: No Transportation Needs (09/01/2021)   PRAPARE - Transportation   . Lack of Transportation (Medical): No   . Lack of Transportation (Non-Medical): No  Physical Activity: Sufficiently Active (09/01/2021)   Exercise Vital Sign   . Days of Exercise per Week: 4 days   . Minutes of Exercise per Session: 60 min  Stress: No Stress Concern Present (09/01/2021)   [place]   . Feeling of Stress : Only a little  Social Connections: Socially Integrated (09/01/2021)   Social Connection and Isolation Panel [NHANES]   . Frequency of Communication with Friends and Family: More than three times a week   . Frequency of Social Gatherings with Friends and Family: Twice a week   . Attends Religious Services: More than 4 times per year   . Active Member of Clubs or Organizations: Yes   . Attends Archivist Meetings: More than 4 times per year   . Marital Status: Married  Human resources officer Violence: Not At Risk (09/01/2021)   Humiliation, Afraid, Rape, and Kick questionnaire   . Fear of Current or Ex-Partner: No   . Emotionally Abused: No   . Physically Abused: No   . Sexually Abused: No    Outpatient Medications Prior to Visit  Medication Sig Dispense Refill  . BD PEN NEEDLE NANO 2ND GEN 32G X 4 MM MISC USE 4 TIMES A DAY AS DIRECTED    . Continuous Blood Gluc Sensor (FREESTYLE LIBRE 2 SENSOR) MISC Inject into the skin every 14 (fourteen) days.    . empagliflozin (JARDIANCE) 10 MG TABS tablet Take 1 tablet (10 mg total) by mouth daily before breakfast. 30 tablet   . glucose blood (ONE TOUCH ULTRA TEST) test strip Use to check cbgs qd 100 each 11  . insulin aspart (NOVOLOG FLEXPEN) 100 UNIT/ML FlexPen 3-5u    . letrozole (FEMARA) 2.5 MG tablet TAKE 1 TABLET BY  MOUTH EVERY DAY 90 tablet 3  . TRESIBA FLEXTOUCH 100 UNIT/ML FlexTouch Pen Inject 22 Units into the skin daily after breakfast.     No facility-administered medications prior to visit.    Allergies  Allergen Reactions  . Metformin Hcl Other (See Comments)    GI upset        Objective:      Physical Exam Constitutional:      General: She is not in acute distress.    Appearance: Normal appearance. She is obese. She is not ill-appearing, toxic-appearing or diaphoretic.  Pulmonary:     Effort: Pulmonary effort is normal.  Musculoskeletal:     Comments:  Small mobile on palpation cyst, hard palpable. No warmth or erythema to site. No pus or drainage. No change in ROM   Neurological:     General: No focal deficit present.     Mental Status: She is alert and oriented to person, place, and time.  Psychiatric:        Mood and Affect: Mood normal.        Behavior: Behavior normal.        Thought Content: Thought content normal.        Judgment: Judgment normal.    BP 122/76   Pulse 70   Temp 98.6 F (37 C)   Resp 16   Ht '5\' 8"'$  (1.727 m)   Wt 216 lb 6 oz (98.1 kg)   LMP 11/06/2016 (Exact Date)   SpO2 98%   BMI 32.90 kg/m  Wt Readings from Last 3 Encounters:  04/12/22 216 lb 6 oz (98.1 kg)  03/28/22 213 lb 8 oz (96.8 kg)  03/22/22 214 lb 8 oz (97.3 kg)     Health Maintenance Due  Topic Date Due  . URINE MICROALBUMIN  06/06/2013  . COVID-19 Vaccine (4 - Pfizer risk series) 02/24/2021  . HEMOGLOBIN A1C  03/07/2022  . INFLUENZA VACCINE  03/15/2022    There are no preventive care reminders to display for this patient.  Lab Results  Component Value Date   TSH 1.64 09/15/2021   Lab Results  Component Value Date   WBC 5.2 10/27/2021   HGB 11.8 (L) 10/27/2021   HCT 37.0 10/27/2021   MCV 82.0 10/27/2021   PLT 236 10/27/2021   Lab Results  Component Value Date   NA 143 10/27/2021   K 3.8 10/27/2021   CO2 30 10/27/2021   GLUCOSE 254 (H) 10/27/2021   BUN 19  10/27/2021   CREATININE 0.86 10/27/2021   BILITOT 0.5 10/27/2021   ALKPHOS 51 10/27/2021   AST 9 (L) 10/27/2021   ALT 11 10/27/2021   PROT 7.3 10/27/2021   ALBUMIN 4.2 10/27/2021   CALCIUM 10.6 (H) 10/27/2021   ANIONGAP 7 10/27/2021   GFR 80.34 09/15/2021   Lab Results  Component Value Date   HGBA1C 9.4 (H) 07/18/2018      Assessment & Plan:   Problem List Items Addressed This Visit       Musculoskeletal and Integument   Epidermoid cyst of finger of left hand - Primary    Handout given to pt  Suspected epidermoid cyst Please monitor site for worsening signs/symptoms of infection to include: increasing redness, increasing tenderness, increase in size, and or pustulant drainage from site. If this is to occur please let me know immediately.   If grwoth of cyst, and or tenderness or restriction of rom may consider derm vs general surgery if needed to excise       No orders of the defined types were placed in this encounter.   Follow-up: No follow-ups on file.    Eugenia Pancoast, FNP

## 2022-04-12 NOTE — Patient Instructions (Signed)
   Regards,   Yaziel Brandon FNP-C  

## 2022-06-27 ENCOUNTER — Ambulatory Visit: Payer: 59 | Attending: General Surgery

## 2022-06-27 VITALS — Wt 214.1 lb

## 2022-06-27 DIAGNOSIS — Z483 Aftercare following surgery for neoplasm: Secondary | ICD-10-CM | POA: Insufficient documentation

## 2022-06-27 NOTE — Therapy (Signed)
OUTPATIENT PHYSICAL THERAPY SOZO SCREENING NOTE   Patient Name: Robin Kennedy MRN: [id number] DOB:July 11, 1976, 46 y.o., female Today's Date: 06/27/2022  PCP: Eugenia Pancoast, [place] PROVIDER: [name], MD   PT End of Session - 06/27/22 0856     Visit Number 16   # unchanged due to screen   PT Start Time 1497    PT Stop Time 0900    PT Time Calculation (min) 5 min    Activity Tolerance Patient tolerated treatment well    Behavior During Therapy [facility] for tasks assessed/performed             Past Medical History:  Diagnosis Date   Breast cancer metastasized to axillary lymph node, left ([place]) 11/09/2020   Cancer ([place]) 06/05/2020   left breast   Diabetes mellitus without complication ([place])    Fibroids 12/06/2016   Malignant neoplasm of upper-inner quadrant of left breast in female, estrogen receptor positive ([place]) 06/05/2020   Neuromuscular disorder ([place])    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   Personal history of chemotherapy    ended 10-07-2020 and did herceptin thru 06-23-2021   Personal history of radiation therapy    01-31-2021   SVD (spontaneous vaginal delivery)    X 3   Past Surgical History:  Procedure Laterality Date   BREAST BIOPSY Left 06/02/2020   x2   BREAST BIOPSY Right 06/22/2020   CYSTOSCOPY N/A 12/20/2016   Procedure: CYSTOSCOPY;  Surgeon: Lavonia Drafts, MD;  Location: [facility];  Service: Gynecology;  Laterality: N/A;   MODIFIED MASTECTOMY Left 11/09/2020   Procedure: LEFT MODIFIED MASTECTOMY WITH LYMPH NODE DISSECTION;  Surgeon: [name], MD;  Location: [facility];  Service: General;  Laterality: Left;   PORT-A-CATH REMOVAL Left 06/24/2021   Procedure: REMOVAL PORT-A-CATH;  Surgeon: [name], MD;  Location: [facility];  Service: General;  Laterality: Left;   PORTACATH PLACEMENT Left 06/23/2020   Procedure: INSERTION PORT-A-CATH WITH ULTRASOUND GUIDANCE;  Surgeon: [name], MD;  Location: [facility];  Service: General;  Laterality: Left;   RADIOACTIVE SEED GUIDED EXCISIONAL BREAST BIOPSY Right 11/09/2020   Procedure: RIGHT BREAST SEED LOCALIZED EXCISIONAL BIOPSY;  Surgeon: [name], MD;  Location: [facility];  Service: General;  Laterality: Right;   ROBOTIC ASSISTED TOTAL HYSTERECTOMY WITH BILATERAL SALPINGO OOPHERECTOMY Bilateral 12/20/2016   Procedure: ROBOTIC ASSISTED TOTAL HYSTERECTOMY WITH BILATERAL SALPINGO OOPHORECTOMY;  Surgeon: Lavonia Drafts, MD;  Location: [facility];  Service: Gynecology;  Laterality: Bilateral;   tooth removal  09/2021   TUBAL LIGATION     WISDOM TOOTH EXTRACTION     Patient Active Problem List   Diagnosis Date Noted   Epidermoid cyst of finger of left hand 04/12/2022   Mass of axillary tail of left breast 03/22/2022   Elevated LDL cholesterol level 03/22/2022   Obesity due to excess calories 09/01/2021   Nontoxic goiter 09/01/2021   Hormone receptor positive breast cancer, left ([place]) 11/09/2020   Malignant neoplasm of upper-inner quadrant of left breast in female, estrogen receptor positive ([place]) 06/05/2020   Controlled type 2 diabetes mellitus without complication, with long-term current use of insulin ([place]) 06/19/2012    REFERRING DIAG: right breast cancer at risk for lymphedema  THERAPY DIAG: Aftercare following surgery for neoplasm  PERTINENT HISTORY: Patient was diagnosed on 06/02/2020 with left grade III invasive ductal carcinoma breast cancer. It is ER positive, PR negative, and HER2 positive with a Ki67 of 60%. She underwent neoadjuvant chemotherapy 06/25/2020-10/07/2020 followed  by a right lumpectomy and a left mastectomy with an axillary lymph node dissection (19 negative nodes removed) on 11/09/2020. She has type II diabetes.  PRECAUTIONS: right UE Lymphedema risk, None  SUBJECTIVE: Pt returns for her 3 month L-Dex screen.   PAIN:  Are you having pain? No  SOZO SCREENING: Patient was assessed  today using the SOZO machine to determine the lymphedema index score. This was compared to her baseline score. It was determined that she is within the recommended range when compared to her baseline and no further action is needed at this time. She will continue SOZO screenings. These are done every 3 months for 2 years post operatively followed by every 6 months for 2 years, and then annually. 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 Info was given for her to call them and reach out to nurse navigator for a script. Pt verbalized good understanding.    L-DEX FLOWSHEETS - 06/27/22 0800       L-DEX LYMPHEDEMA SCREENING   Measurement Type Unilateral    L-DEX MEASUREMENT EXTREMITY Upper Extremity    POSITION  Standing    DOMINANT SIDE Right    At Risk Side Left    BASELINE SCORE (UNILATERAL) 4.7    L-DEX SCORE (UNILATERAL) 1.4    VALUE CHANGE (UNILAT) -3.3               Otelia Limes, PTA 06/27/2022, 8:59 AM

## 2022-07-11 ENCOUNTER — Other Ambulatory Visit (HOSPITAL_COMMUNITY): Payer: Self-pay

## 2022-07-11 MED ORDER — OZEMPIC (0.25 OR 0.5 MG/DOSE) 2 MG/3ML ~~LOC~~ SOPN
0.2500 mg | PEN_INJECTOR | SUBCUTANEOUS | 5 refills | Status: DC
Start: 2022-07-11 — End: 2023-07-27
  Filled 2022-07-11 (×2): qty 3, 42d supply, fill #0

## 2022-07-11 MED ORDER — OZEMPIC (0.25 OR 0.5 MG/DOSE) 2 MG/3ML ~~LOC~~ SOPN
PEN_INJECTOR | SUBCUTANEOUS | 5 refills | Status: DC
  Filled 2022-07-11: qty 9, 90d supply, fill #0

## 2022-09-26 ENCOUNTER — Ambulatory Visit: Payer: 59

## 2022-09-30 ENCOUNTER — Encounter: Payer: Self-pay | Admitting: General Practice

## 2022-10-11 ENCOUNTER — Other Ambulatory Visit: Payer: Self-pay | Admitting: Hematology and Oncology

## 2022-10-11 DIAGNOSIS — Z1231 Encounter for screening mammogram for malignant neoplasm of breast: Secondary | ICD-10-CM

## 2022-10-11 DIAGNOSIS — E7800 Pure hypercholesterolemia, unspecified: Secondary | ICD-10-CM | POA: Diagnosis not present

## 2022-10-11 DIAGNOSIS — E6609 Other obesity due to excess calories: Secondary | ICD-10-CM | POA: Diagnosis not present

## 2022-10-11 DIAGNOSIS — E049 Nontoxic goiter, unspecified: Secondary | ICD-10-CM | POA: Diagnosis not present

## 2022-10-11 DIAGNOSIS — E1165 Type 2 diabetes mellitus with hyperglycemia: Secondary | ICD-10-CM | POA: Diagnosis not present

## 2022-10-11 DIAGNOSIS — C50919 Malignant neoplasm of unspecified site of unspecified female breast: Secondary | ICD-10-CM | POA: Diagnosis not present

## 2022-10-12 ENCOUNTER — Ambulatory Visit
Admission: RE | Admit: 2022-10-12 | Discharge: 2022-10-12 | Disposition: A | Discharge status: Home / Self Care | Payer: 59 | Source: Ambulatory Visit | Attending: Hematology and Oncology | Admitting: Hematology and Oncology

## 2022-10-12 DIAGNOSIS — Z1231 Encounter for screening mammogram for malignant neoplasm of breast: Secondary | ICD-10-CM | POA: Diagnosis not present

## 2022-10-24 ENCOUNTER — Ambulatory Visit: Payer: 59 | Attending: General Surgery

## 2022-10-24 VITALS — Wt 206.1 lb

## 2022-10-24 DIAGNOSIS — Z483 Aftercare following surgery for neoplasm: Secondary | ICD-10-CM | POA: Insufficient documentation

## 2022-10-24 NOTE — Therapy (Signed)
OUTPATIENT PHYSICAL THERAPY SOZO SCREENING NOTE   Patient Name: Robin Kennedy MRN: [id number] DOB:12-15-1975, 47 y.o., female Today's Date: 10/24/2022  PCP: Eugenia Pancoast, FNP REFERRING PROVIDER: [name], MD   PT End of Session - 10/24/22 0831     Visit Number 16   # unchanged due to screen only   PT Start Time 0830    PT Stop Time 0834    PT Time Calculation (min) 4 min    Activity Tolerance Patient tolerated treatment well    Behavior During Therapy [facility] for tasks assessed/performed             Past Medical History:  Diagnosis Date   Breast cancer metastasized to axillary lymph node, left ([place]) 11/09/2020   Cancer ([place]) 06/05/2020   left breast   Diabetes mellitus without complication ([place])    Fibroids 12/06/2016   Malignant neoplasm of upper-inner quadrant of left breast in female, estrogen receptor positive ([place]) 06/05/2020   Neuromuscular disorder ([place])    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   Personal history of chemotherapy    ended 10-07-2020 and did herceptin thru 06-23-2021   Personal history of radiation therapy    01-31-2021   SVD (spontaneous vaginal delivery)    X 3   Past Surgical History:  Procedure Laterality Date   BREAST BIOPSY Left 06/02/2020   x2   BREAST BIOPSY Right 06/22/2020   BREAST BIOPSY Right 10/2020   CYSTOSCOPY N/A 12/20/2016   Procedure: CYSTOSCOPY;  Surgeon: Lavonia Drafts, MD;  Location: [facility];  Service: Gynecology;  Laterality: N/A;   MASTECTOMY Left    MODIFIED MASTECTOMY Left 11/09/2020   Procedure: LEFT MODIFIED MASTECTOMY WITH LYMPH NODE DISSECTION;  Surgeon: [name], MD;  Location: [facility];  Service: General;  Laterality: Left;   PORT-A-CATH REMOVAL Left 06/24/2021   Procedure: REMOVAL PORT-A-CATH;  Surgeon: [name], MD;  Location: [facility];  Service: General;  Laterality: Left;   PORTACATH PLACEMENT Left 06/23/2020   Procedure: INSERTION PORT-A-CATH WITH ULTRASOUND  GUIDANCE;  Surgeon: [name], MD;  Location: [place];  Service: General;  Laterality: Left;   RADIOACTIVE SEED GUIDED EXCISIONAL BREAST BIOPSY Right 11/09/2020   Procedure: RIGHT BREAST SEED LOCALIZED EXCISIONAL BIOPSY;  Surgeon: [name], MD;  Location: [facility];  Service: General;  Laterality: Right;   ROBOTIC ASSISTED TOTAL HYSTERECTOMY WITH BILATERAL SALPINGO OOPHERECTOMY Bilateral 12/20/2016   Procedure: ROBOTIC ASSISTED TOTAL HYSTERECTOMY WITH BILATERAL SALPINGO OOPHORECTOMY;  Surgeon: Lavonia Drafts, MD;  Location: [facility];  Service: Gynecology;  Laterality: Bilateral;   tooth removal  09/2021   TUBAL LIGATION     WISDOM TOOTH EXTRACTION     Patient Active Problem List   Diagnosis Date Noted   Epidermoid cyst of finger of left hand 04/12/2022   Mass of axillary tail of left breast 03/22/2022   Elevated LDL cholesterol level 03/22/2022   Obesity due to excess calories 09/01/2021   Nontoxic goiter 09/01/2021   Hormone receptor positive breast cancer, left ([place]) 11/09/2020   Malignant neoplasm of upper-inner quadrant of left breast in female, estrogen receptor positive ([place]) 06/05/2020   Controlled type 2 diabetes mellitus without complication, with long-term current use of insulin ([place]) 06/19/2012    REFERRING DIAG: right breast cancer at risk for lymphedema  THERAPY DIAG: Aftercare following surgery for neoplasm  PERTINENT HISTORY: Patient was diagnosed on 06/02/2020 with left grade III invasive ductal carcinoma breast cancer. It is ER positive, PR negative, and HER2  positive with a Ki67 of 60%. She underwent neoadjuvant chemotherapy 06/25/2020-10/07/2020 followed by a right lumpectomy and a left mastectomy with an axillary lymph node dissection (19 negative nodes removed) on 11/09/2020. She has type II diabetes.  PRECAUTIONS: right UE Lymphedema risk, None  SUBJECTIVE: Pt returns for her 3 month L-Dex screen.   PAIN:  Are you  having pain? No  SOZO SCREENING: Patient was assessed today using the SOZO machine to determine the lymphedema index score. This was compared to her baseline score. It was determined that she is within the recommended range when compared to her baseline and no further action is needed at this time. She will continue SOZO screenings. These are done every 3 months for 2 years post operatively followed by every 6 months for 2 years, and then annually. 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 Info was given for her to call them and reach out to nurse navigator for a script. Pt verbalized good understanding.   P: Will begin 6 months now.    L-DEX FLOWSHEETS - 10/24/22 0800       L-DEX LYMPHEDEMA SCREENING   Measurement Type Unilateral    L-DEX MEASUREMENT EXTREMITY Upper Extremity    POSITION  Standing    DOMINANT SIDE Right    At Risk Side Left    BASELINE SCORE (UNILATERAL) 4.7    L-DEX SCORE (UNILATERAL) 4.6    VALUE CHANGE (UNILAT) -0.1               Otelia Limes, PTA 10/24/2022, 8:33 AM

## 2022-10-25 NOTE — Progress Notes (Signed)
Patient Care Team: Eugenia Pancoast, [place] as PCP - General (Family Medicine) Shawnee Knapp, MD (Family Medicine) [name], MD as Consulting Physician (General Surgery) Nicholas Lose, MD as Consulting Physician (Hematology and Oncology) Eppie Gibson, MD as Attending Physician (Radiation Oncology) Jacelyn Pi, MD as Consulting Physician (Endocrinology) Lavonia Drafts, MD as Consulting Physician (Obstetrics and Gynecology)  DIAGNOSIS:  Encounter Diagnosis  Name Primary?   Malignant neoplasm of upper-inner quadrant of left breast in female, estrogen receptor positive ([place]) Yes    SUMMARY OF ONCOLOGIC HISTORY: Oncology History  Malignant neoplasm of upper-inner quadrant of left breast in female, estrogen receptor positive ([place])  06/05/2020 Initial Diagnosis   06/05/2020: Enlarging left breast with pain and diffuse skin thickening: Mammogram revealed 3.5 cm mass 11 o'clock position with 3 abnormal lymph nodes: 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 Right breast benign-appearing calcifications previous stereotactic biopsy was attempted but it was too superficial and felt to be benign.   06/10/2020 Cancer Staging   Staging form: Breast, AJCC 8th Edition - Clinical stage from 06/10/2020: Stage IIIB (cT4d, cN3c, cM0, G3, ER+, PR-, HER2+) - Signed by Eppie Gibson, MD on 12/22/2020 Stage prefix: Initial diagnosis Histologic grading system: 3 grade system Laterality: Left Staged by: Pathologist and managing physician Stage used in treatment planning: Yes National guidelines used in treatment planning: Yes Type of national guideline used in treatment planning: NCCN   06/25/2020 - 10/09/2020 Chemotherapy   Neoadjuvant chemotherapy with [facility] Perjeta 6 cycles followed by Herceptin Perjeta maintenance for 1 year       06/29/2020 Genetic Testing   Negative genetic testing: no pathogenic variants detected in [facility] Common Hereditary  Cancers Panel.  The report date is June 29, 2020.   The Common Hereditary Cancers Panel offered by [facility] includes sequencing and/or deletion duplication testing of the following 48 genes: APC, ATM, AXIN2, BARD1, BMPR1A, BRCA1, BRCA2, BRIP1, CDH1, CDK4, CDKN2A (p14ARF), CDKN2A (p16INK4a), CHEK2, CTNNA1, DICER1, EPCAM (Deletion/duplication testing only), GREM1 (promoter region deletion/duplication testing only), KIT, MEN1, MLH1, MSH2, MSH3, MSH6, MUTYH, NBN, NF1, NHTL1, PALB2, PDGFRA, PMS2, POLD1, POLE, PTEN, RAD50, RAD51C, RAD51D, RNF43, SDHB, SDHC, SDHD, SMAD4, SMARCA4. STK11, TP53, TSC1, TSC2, and VHL.  The following genes were evaluated for sequence changes only: SDHA and HOXB13 c.251G>A variant only.   10/28/2020 - 06/23/2021 Chemotherapy   Patient is on Treatment Plan : BREAST Trastuzumab + Pertuzumab q21d     11/09/2020 Surgery   Right lumpectomy and left mastectomy [name]):  Right breast: no evidence of malignancy Left breast: no residual carcinoma, with 19 left axillary lymph nodes negative for carcinoma.   11/09/2020 Cancer Staging   Staging form: Breast, AJCC 8th Edition - Pathologic stage from 11/09/2020: No Stage Recommended (ypT0, pN0, cM0) - Signed by [name], NP on 08/23/2021 Stage prefix: Post-therapy   12/29/2020 - 02/09/2021 Radiation Therapy   Adjuvant radiation 12/29/2020 through 02/09/2021 Site Technique Total Dose (Gy) Dose per Fx (Gy) Completed Fx Beam Energies  Chest Wall, Left: CW_Lt_IMN 3D 50/50 2 25/25 6X  Chest Wall, Left: CW_Lt_SCV_PAB 3D 50/50 2 25/25 6X, 15X  Chest Wall, Left: CW_Lt_Bst Electron 10/10 2 5/5 6E    04/2021 -  Anti-estrogen oral therapy   Letrozole daily     CHIEF COMPLIANT: Follow-up on letrozole therapy    INTERVAL HISTORY: Robin Kennedy is a 47 y.o. with above-mentioned history of  inflammatory left breast cancer who completed neoadjuvant chemotherapy. She presents to the clinic  today for follow-up on letrozole  therapy.    ALLERGIES:  is allergic to metformin hcl.  MEDICATIONS:  Current Outpatient Medications  Medication Sig Dispense Refill   BD PEN NEEDLE NANO 2ND GEN 32G X 4 MM MISC USE 4 TIMES A DAY AS DIRECTED     Continuous Blood Gluc Sensor (FREESTYLE LIBRE 2 SENSOR) MISC Inject into the skin every 14 (fourteen) days.     empagliflozin (JARDIANCE) 10 MG TABS tablet Take 1 tablet (10 mg total) by mouth daily before breakfast. 30 tablet    glucose blood (ONE TOUCH ULTRA TEST) test strip Use to check cbgs qd 100 each 11   insulin aspart (NOVOLOG FLEXPEN) 100 UNIT/ML FlexPen 3-5u     letrozole (FEMARA) 2.5 MG tablet TAKE 1 TABLET BY MOUTH EVERY DAY 90 tablet 3   Semaglutide,0.25 or 0.5MG /DOS, (OZEMPIC, 0.25 OR 0.5 MG/DOSE,) 2 MG/3ML SOPN Inject 0.25 mg into the skin once a week, then titrate to 0.5mg  once a week as directed. 9 mL 5   Semaglutide,0.25 or 0.5MG /DOS, (OZEMPIC, 0.25 OR 0.5 MG/DOSE,) 2 MG/3ML SOPN 0.25mg  titrate to 0.5mg  Subcutaneous once a week 90 days 9 mL 5   TRESIBA FLEXTOUCH 100 UNIT/ML FlexTouch Pen Inject 22 Units into the skin daily after breakfast.     No current facility-administered medications for this visit.    PHYSICAL EXAMINATION: ECOG PERFORMANCE STATUS: 1 - Symptomatic but completely ambulatory  Vitals:   10/31/22 1008  BP: 128/80  Pulse: 75  Resp: 14  Temp: (!) 97 F (36.1 C)  SpO2: 100%   Filed Weights   10/31/22 1008  Weight: 207 lb 11.2 oz (94.2 kg)      LABORATORY DATA:  I have reviewed the data as listed    Latest Ref Rng & Units 10/27/2021   11:27 AM 09/15/2021    9:03 AM 06/23/2021    7:39 AM  CMP  Glucose 70 - 99 mg/dL 254  167  193   BUN 6 - 20 mg/dL 19  30  22    Creatinine 0.44 - 1.00 mg/dL 0.86  0.87  0.98   Sodium 135 - 145 mmol/L 143  139  142   Potassium 3.5 - 5.1 mmol/L 3.8  4.5  3.9   Chloride 98 - 111 mmol/L 106  107  109   CO2 22 - 32 mmol/L 30  25  23    Calcium 8.9 - 10.3 mg/dL 10.6  10.0  9.5   Total Protein 6.5 - 8.1  g/dL 7.3  6.9  6.9   Total Bilirubin 0.3 - 1.2 mg/dL 0.5  0.4  0.3   Alkaline Phos 38 - 126 U/L 51  47  48   AST 15 - 41 U/L 9  10  9    ALT 0 - 44 U/L 11  13  10      Lab Results  Component Value Date   WBC 5.2 10/27/2021   HGB 11.8 (L) 10/27/2021   HCT 37.0 10/27/2021   MCV 82.0 10/27/2021   PLT 236 10/27/2021   NEUTROABS 3.0 10/27/2021    ASSESSMENT & PLAN:  Malignant neoplasm of upper-inner quadrant of left breast in female, estrogen receptor positive ([place]) 06/05/2020: Enlarging left breast with pain and diffuse skin thickening: Mammogram revealed 3.5 cm mass 11 o'clock position with 3 abnormal lymph nodes: 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 Right breast benign-appearing calcifications previous stereotactic biopsy was attempted but it was too superficial  and felt to be benign.   Recommendation based on multidisciplinary tumor board: 1. Neoadjuvant chemotherapy with T[facility] Perjeta 6 cycles followed by Herceptin Perjeta maintenance for 1 year completed 06/23/2021 2. 11/09/20: Right lumpectomy and left mastectomy [name]): Right breast: no evidence of malignancy Left breast: no residual carcinoma, with 19 left axillary lymph nodes negative for carcinoma. 3. Followed by adjuvant radiation therapy (due to inflammatory breast changes at diagnosis) completed 02/09/2021 4.  Antiestrogen therapy with letrozole started July 2022 ------------------------------------------------------------------------------------------------------------------------------------------   Current treatment: Letrozole 2.5 mg daily   PET CT scan 04/28/2021: No evidence of metastatic disease.  Postsurgical changes.   Letrozole toxicities: No hot flashes or muscle pains Taking calcium and Vit D   Breast cancer surveillance:  1. Mammograms 10/13/2022: Normal appearance of right breast.  Left mastectomy, breast density category B 2. breast exam 10/31/2022: Benign 3.  CT  CAP 10/27/2021: No evidence of metastatic disease within the chest abdomen pelvis.  Postradiation changes.  Gastritis   Patient tells me that she not had any survivorship care plan appointments in the past.  Therefore we will set her up next year to see Mendel Ryder to go to a survivorship.  After that I can see her annually.    No orders of the defined types were placed in this encounter.  The patient has a good understanding of the overall plan. she agrees with it. she will call with any problems that may develop before the next visit here. Total time spent: 30 mins including face to face time and time spent for planning, charting and co-ordination of care   Harriette Ohara, MD 10/31/22    I Gardiner Coins am acting as a Education administrator for [employer]  I have reviewed the above documentation for accuracy and completeness, and I agree with the above.

## 2022-10-30 NOTE — Assessment & Plan Note (Signed)
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 Right breast benign-appearing calcifications previous stereotactic biopsy was attempted but it was too superficial and felt to be benign.   Recommendation based on multidisciplinary tumor board: 1. Neoadjuvant chemotherapy with TCH Perjeta 6 cycles followed by Herceptin Perjeta maintenance for 1 year completed 06/23/2021 2. 11/09/20: Right lumpectomy and left mastectomy [name]): Right breast: no evidence of malignancy Left breast: no residual carcinoma, with 19 left axillary lymph nodes negative for carcinoma. 3. Followed by adjuvant radiation therapy (due to inflammatory breast changes at diagnosis) completed 02/09/2021 4.  Antiestrogen therapy with letrozole started July 2022 ------------------------------------------------------------------------------------------------------------------------------------------   Current treatment: Letrozole 2.5 mg daily   PET CT scan 04/28/2021: No evidence of metastatic disease.  Postsurgical changes.   Letrozole toxicities: No hot flashes or muscle pains Taking calcium and Vit D   Breast cancer surveillance:  1. Mammograms 10/13/2022: Normal appearance of right breast.  Left mastectomy, breast density category B 2. breast exam 10/31/2022: Benign 3.  CT CAP 10/27/2021: No evidence of metastatic disease within the chest abdomen pelvis.  Postradiation changes.  Gastritis   Return to clinic in 1 year for follow-up

## 2022-10-31 ENCOUNTER — Inpatient Hospital Stay: Payer: 59 | Attending: Hematology and Oncology | Admitting: Hematology and Oncology

## 2022-10-31 VITALS — BP 128/80 | HR 75 | Temp 97.0°F | Resp 14 | Wt 207.7 lb

## 2022-10-31 DIAGNOSIS — Z170 Estrogen receptor positive status [ER+]: Secondary | ICD-10-CM | POA: Diagnosis not present

## 2022-10-31 DIAGNOSIS — Z9012 Acquired absence of left breast and nipple: Secondary | ICD-10-CM | POA: Diagnosis not present

## 2022-10-31 DIAGNOSIS — C50212 Malignant neoplasm of upper-inner quadrant of left female breast: Secondary | ICD-10-CM | POA: Insufficient documentation

## 2022-10-31 DIAGNOSIS — Z79811 Long term (current) use of aromatase inhibitors: Secondary | ICD-10-CM | POA: Diagnosis not present

## 2022-10-31 MED ORDER — LETROZOLE 2.5 MG PO TABS: 2.5000 mg | ORAL_TABLET | Freq: Every day | ORAL | 3 refills | Status: DC

## 2023-03-29 ENCOUNTER — Ambulatory Visit (INDEPENDENT_AMBULATORY_CARE_PROVIDER_SITE_OTHER): Payer: Managed Care, Other (non HMO) | Admitting: Obstetrics & Gynecology

## 2023-03-29 ENCOUNTER — Encounter: Payer: Self-pay | Admitting: Obstetrics & Gynecology

## 2023-03-29 VITALS — BP 111/76 | HR 88 | Ht 68.0 in | Wt 219.0 lb

## 2023-03-29 DIAGNOSIS — Z01419 Encounter for gynecological examination (general) (routine) without abnormal findings: Secondary | ICD-10-CM

## 2023-03-29 NOTE — Progress Notes (Signed)
Subjective:     Robin Kennedy is a 47 y.o. female here for a routine exam.  Current complaints: no GYN complaints.      Gynecologic History Patient's last menstrual period was 11/06/2016 (exact date). Contraception: status post hysterectomy Last Pap: n/a Last mammogram: 02/02/2021. Results were: normal  Obstetric History OB History  Gravida Para Term Preterm AB Living  4 3 3  0 1 3  SAB IAB Ectopic Multiple Live Births  0 1 0   3    # Outcome Date GA Lbr Len/2nd Weight Sex Type Anes PTL Lv  4 Term 02/06/02    M Vag-Spont   LIV  3 IAB 1998          2 Term 10/09/95    M Vag-Spont   LIV  1 Term 11/19/93    F Vag-Spont   LIV     The following portions of the patient's history were reviewed and updated as appropriate: allergies, current medications, past family history, past medical history, past social history, past surgical history, and problem list.  Review of Systems Pertinent items are noted in HPI.    Objective:  BP 111/76   Pulse 88   Ht 5\' 8"  (1.727 m)   Wt 219 lb (99.3 kg)   LMP 11/06/2016 (Exact Date)   BMI 33.30 kg/m  General Appearance:    Alert, cooperative, no distress, appears stated age  Head:    Normocephalic, without obvious abnormality, atraumatic  Eyes:    conjunctiva/corneas clear, EOM's intact, both eyes  Ears:    Normal external ear canals, both ears  Nose:   Nares normal, septum midline, mucosa normal, no drainage    or sinus tenderness  Throat:   Lips, mucosa, and tongue normal; teeth and gums normal  Neck:   Supple, symmetrical, trachea midline, no adenopathy;    thyroid:  no enlargement/tenderness/nodules  Back:     Symmetric, no curvature, ROM normal, no CVA tenderness  Lungs:     respirations unlabored  Chest Wall:    No tenderness or deformity   Heart:    Regular rate and rhythm  Breast Exam:    No tenderness, masses, or nipple abnormality on right. S/p mastectomy on left. Not masses noted. Well healed.   Abdomen:     Soft, non-tender,  bowel sounds active all four quadrants,    no masses, no organomegaly  Genitalia:    Normal female without lesion, discharge or tenderness   Uterus: surgically absent   Extremities:   Extremities normal, atraumatic, no cyanosis or edema  Pulses:   2+ and symmetric all extremities  Skin:   Skin color, texture, turgor normal, no rashes or lesions     Assessment:    Healthy female exam.    Plan:  Diagnoses and all orders for this visit:  Well female exam with routine gynecological exam   F/u in 1 year or sooner   Catheleen Langhorne L. Harraway-Smith, M.D., [place]

## 2023-04-19 ENCOUNTER — Encounter: Payer: Self-pay | Admitting: Obstetrics & Gynecology

## 2023-04-24 ENCOUNTER — Ambulatory Visit: Payer: Managed Care, Other (non HMO) | Attending: General Surgery

## 2023-04-24 VITALS — Wt 217.1 lb

## 2023-04-24 DIAGNOSIS — Z483 Aftercare following surgery for neoplasm: Secondary | ICD-10-CM | POA: Insufficient documentation

## 2023-04-24 NOTE — Therapy (Signed)
OUTPATIENT PHYSICAL THERAPY SOZO SCREENING NOTE   Patient Name: Robin Kennedy MRN: [id number] DOB:[birth year], 47 y.o., female Today's Date: 04/24/2023  PCP: Mort Sawyers, FNP REFERRING PROVIDER: Almond Lint, MD   PT End of Session - 04/24/23 0815     Visit Number 16   # unchanged due to screen only   PT Start Time 0813    PT Stop Time 0817    PT Time Calculation (min) 4 min    Activity Tolerance Patient tolerated treatment well    Behavior During Therapy [facility] for tasks assessed/performed             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   Personal history of chemotherapy    ended 10-07-2020 and did herceptin thru 06-23-2021   Personal history of radiation therapy    01-31-2021   SVD (spontaneous vaginal delivery)    X 3   Past Surgical History:  Procedure Laterality Date   BREAST BIOPSY Left 06/02/2020   x2   BREAST BIOPSY Right 06/22/2020   BREAST BIOPSY Right 10/2020   CYSTOSCOPY N/A 12/20/2016   Procedure: CYSTOSCOPY;  Surgeon: Willodean Rosenthal, MD;  Location: [facility];  Service: Gynecology;  Laterality: N/A;   MASTECTOMY Left    MODIFIED MASTECTOMY Left 11/09/2020   Procedure: LEFT MODIFIED MASTECTOMY WITH LYMPH NODE DISSECTION;  Surgeon: Almond Lint, MD;  Location: [facility];  Service: General;  Laterality: Left;   PORT-A-CATH REMOVAL Left 06/24/2021   Procedure: REMOVAL PORT-A-CATH;  Surgeon: Almond Lint, MD;  Location: [facility];  Service: General;  Laterality: Left;   PORTACATH PLACEMENT Left 06/23/2020   Procedure: INSERTION PORT-A-CATH WITH ULTRASOUND  GUIDANCE;  Surgeon: Almond Lint, MD;  Location: [facility];  Service: General;  Laterality: Left;   RADIOACTIVE SEED GUIDED EXCISIONAL BREAST BIOPSY Right 11/09/2020   Procedure: RIGHT BREAST SEED LOCALIZED EXCISIONAL BIOPSY;  Surgeon: Almond Lint, MD;  Location: [facility];  Service: General;  Laterality: Right;   ROBOTIC ASSISTED TOTAL HYSTERECTOMY WITH BILATERAL SALPINGO OOPHERECTOMY Bilateral 12/20/2016   Procedure: ROBOTIC ASSISTED TOTAL HYSTERECTOMY WITH BILATERAL SALPINGO OOPHORECTOMY;  Surgeon: Willodean Rosenthal, MD;  Location: [facility];  Service: Gynecology;  Laterality: Bilateral;   tooth removal  09/2021   TUBAL LIGATION     WISDOM TOOTH EXTRACTION     Patient Active Problem List   Diagnosis Date Noted   Epidermoid cyst of finger of left hand 04/12/2022   Mass of axillary tail of left breast 03/22/2022   Elevated LDL cholesterol level 03/22/2022   Obesity due to excess calories 09/01/2021   Nontoxic goiter 09/01/2021   Hormone receptor positive breast cancer, left (HCC) 11/09/2020   Malignant neoplasm of upper-inner quadrant of left breast in female, 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   Controlled type 2 diabetes mellitus without complication, with long-term current use of insulin (HCC) 06/19/2012    REFERRING DIAG: right breast cancer at risk for lymphedema  THERAPY DIAG: Aftercare following surgery for neoplasm  PERTINENT HISTORY: Patient was diagnosed on 06/02/2020 with left grade III invasive ductal carcinoma breast cancer. It is ER positive, PR negative, and HER2  positive with a Ki67 of 60%. She underwent neoadjuvant chemotherapy 06/25/2020-10/07/2020 followed by a right lumpectomy and a left mastectomy with an axillary lymph node dissection (19 negative nodes removed) on 11/09/2020. She has type II diabetes.  PRECAUTIONS: right UE Lymphedema risk, None  SUBJECTIVE: Pt returns for her 6 month L-Dex screen.   PAIN:  Are you  having pain? No  SOZO SCREENING: Patient was assessed today using the SOZO machine to determine the lymphedema index score. This was compared to her baseline score. It was determined that she is within the recommended range when compared to her baseline and no further action is needed at this time. She will continue SOZO screenings. These are done every 3 months for 2 years post operatively followed by every 6 months for 2 years, and then annually. Also advised pt to get a compression bra from [employer] as she reports she has been noticing some chest swelling since returning to work baking cakes. Info was given for her to call them and reach out to nurse navigator for a script. Pt verbalized good understanding.      L-DEX FLOWSHEETS - 04/24/23 0800       L-DEX LYMPHEDEMA SCREENING   Measurement Type Unilateral    L-DEX MEASUREMENT EXTREMITY Upper Extremity    POSITION  Standing    DOMINANT SIDE Right    At Risk Side Left    BASELINE SCORE (UNILATERAL) 4.7    L-DEX SCORE (UNILATERAL) 1.7    VALUE CHANGE (UNILAT) -3               Hermenia Bers, PTA 04/24/2023, 8:17 AM

## 2023-07-27 ENCOUNTER — Ambulatory Visit: Payer: Managed Care, Other (non HMO) | Admitting: Family

## 2023-07-27 ENCOUNTER — Encounter: Payer: Self-pay | Admitting: Family

## 2023-07-27 VITALS — BP 124/80 | HR 87 | Temp 98.4°F | Ht 68.0 in | Wt 224.0 lb

## 2023-07-27 DIAGNOSIS — Z853 Personal history of malignant neoplasm of breast: Secondary | ICD-10-CM | POA: Diagnosis not present

## 2023-07-27 DIAGNOSIS — I889 Nonspecific lymphadenitis, unspecified: Secondary | ICD-10-CM | POA: Insufficient documentation

## 2023-07-27 DIAGNOSIS — R590 Localized enlarged lymph nodes: Secondary | ICD-10-CM | POA: Diagnosis not present

## 2023-07-27 LAB — CBC WITH DIFFERENTIAL/PLATELET
Basophils Absolute: 0 10*3/uL (ref 0.0–0.1)
Basophils Relative: 0.5 % (ref 0.0–3.0)
Eosinophils Absolute: 0.1 10*3/uL (ref 0.0–0.7)
Eosinophils Relative: 1.4 % (ref 0.0–5.0)
HCT: 39.3 % (ref 36.0–46.0)
Hemoglobin: 12.6 g/dL (ref 12.0–15.0)
Lymphocytes Relative: 29.5 % (ref 12.0–46.0)
Lymphs Abs: 2.2 10*3/uL (ref 0.7–4.0)
MCHC: 32 g/dL (ref 30.0–36.0)
MCV: 84.4 fL (ref 78.0–100.0)
Monocytes Absolute: 0.5 10*3/uL (ref 0.1–1.0)
Monocytes Relative: 7.3 % (ref 3.0–12.0)
Neutro Abs: 4.5 10*3/uL (ref 1.4–7.7)
Neutrophils Relative %: 61.3 % (ref 43.0–77.0)
Platelets: 209 10*3/uL (ref 150.0–400.0)
RBC: 4.65 Mil/uL (ref 3.87–5.11)
RDW: 15.4 % (ref 11.5–15.5)
WBC: 7.4 10*3/uL (ref 4.0–10.5)

## 2023-07-27 NOTE — Assessment & Plan Note (Addendum)
U/s soft tissue neck focused on supraclavicular, stat pending results.  If infectious process will treat accordingly Dental health: Discussed importance of regular tooth brushing, flossing, and dental visits. Pt advised to:  Please monitor site for worsening signs/symptoms of infection to include: increasing redness, increasing tenderness, increase in size, and or pustulant drainage from site. If this is to occur please let me know immediately.

## 2023-07-27 NOTE — Patient Instructions (Addendum)
  I have sent an electronic order over to your preferred location for the following:   [x]   left ultrasound axilla and left ultrasound neck  Please give this center a call to get scheduled at your convenience.   [x]   [facility]      [address] [place], [place]        [phone]

## 2023-07-27 NOTE — Assessment & Plan Note (Signed)
Stat left axillary u/s pending results Pt with h/o breast cancer Cbc ordered pending Ddx viral/bacterial/vs concern for neoplasm

## 2023-07-27 NOTE — Progress Notes (Signed)
Established Patient Office Visit  Subjective:   Patient ID: Robin Kennedy, female    DOB: 11/27/1975  Age: 47 y.o. MRN: [id number]  CC:  Chief Complaint  Patient presents with   Edema    Reports swelling on the left side of her body. This started x5 days ago.    HPI: Robin Kennedy is a 47 y.o. female presenting on 07/27/2023 for Edema (Reports swelling on the left side of her body. This started x5 days ago.)  Over the last five days has noticed  She does have some sore throat and some slight congestion that started this am. She states she has noticed some 'swelling' on her left upper shoulder as well as left lateral upper side of the back, slightly behind axillary region. Does have tenderness with movement, no known injury. Some tenderness when lifting her left should on the inner axillary area. No fever.   Does have h/o left breast cancer dx 06/05/20, had mastectomy on left side with removal of 19 lymph nodes in the axillary area. Currently on letrazole and followed by  Dr. Pamelia Hoit, [employer]. Normal mammo 10/13/22 right breast. CT cap 10/27/21 no evidence of mets within chest abdomen pelvis, post radiation. One year f/u with oncology.          ROS: Negative unless specifically indicated above in HPI.   Relevant past medical history reviewed and updated as indicated.   Allergies and medications reviewed and updated.   Current Outpatient Medications:    BD PEN NEEDLE NANO 2ND GEN 32G X 4 MM MISC, USE 4 TIMES A DAY AS DIRECTED, Disp: , Rfl:    Continuous Blood Gluc Sensor (FREESTYLE LIBRE 2 SENSOR) MISC, Inject into the skin every 14 (fourteen) days., Disp: , Rfl:    empagliflozin (JARDIANCE) 10 MG TABS tablet, Take 1 tablet (10 mg total) by mouth daily before breakfast., Disp: 30 tablet, Rfl:    glucose blood (ONE TOUCH ULTRA TEST) test strip, Use to check cbgs qd, Disp: 100 each, Rfl: 11   insulin aspart (NOVOLOG FLEXPEN) 100 UNIT/ML FlexPen, 3-5u, Disp: , Rfl:     letrozole (FEMARA) 2.5 MG tablet, Take 1 tablet (2.5 mg total) by mouth daily., Disp: 90 tablet, Rfl: 3   rosuvastatin (CRESTOR) 10 MG tablet, Take 10 mg by mouth at bedtime., Disp: , Rfl:    TRESIBA FLEXTOUCH 100 UNIT/ML FlexTouch Pen, Inject 22 Units into the skin daily after breakfast., Disp: , Rfl:   Allergies  Allergen Reactions   Metformin Hcl Other (See Comments)    GI upset    Objective:   BP 124/80 (BP Location: Right Arm, Patient Position: Sitting, Cuff Size: Large)   Pulse 87   Temp 98.4 F (36.9 C) (Temporal)   Ht 5\' 8"  (1.727 m)   Wt 224 lb (101.6 kg)   LMP 11/06/2016 (Exact Date)   SpO2 95%   BMI 34.06 kg/m    Physical Exam Constitutional:      General: She is not in acute distress.    Appearance: Normal appearance. She is normal weight. She is not ill-appearing, toxic-appearing or diaphoretic.  HENT:     Head: Normocephalic.     Right Ear: Tympanic membrane normal.     Left Ear: Tympanic membrane normal.     Nose: Nose normal.     Mouth/Throat:     Mouth: Mucous membranes are dry.     Pharynx: No oropharyngeal exudate or posterior oropharyngeal erythema.  Eyes:     Extraocular Movements: Extraocular  movements intact.     Pupils: Pupils are equal, round, and reactive to light.  Cardiovascular:     Rate and Rhythm: Normal rate and regular rhythm.     Pulses: Normal pulses.     Heart sounds: Normal heart sounds.  Pulmonary:     Effort: Pulmonary effort is normal.     Breath sounds: Normal breath sounds.  Musculoskeletal:     Cervical back: Normal range of motion.  Lymphadenopathy:     Upper Body:     Left upper body: Supraclavicular adenopathy (with tenderness) and axillary adenopathy (with tenderness) present.  Neurological:     General: No focal deficit present.     Mental Status: She is alert and oriented to person, place, and time. Mental status is at baseline.  Psychiatric:        Mood and Affect: Mood normal.        Behavior: Behavior normal.         Thought Content: Thought content normal.        Judgment: Judgment normal.     Assessment & Plan:  Axillary lymphadenitis Assessment & Plan: Stat left axillary u/s pending results Pt with h/o breast cancer Cbc ordered pending Ddx viral/bacterial/vs concern for neoplasm  Orders: -     CBC with Differential/Platelet -     US BREAST COMPLETE UNI LEFT INC AXILLA; Future  Supraclavicular lymphadenopathy Assessment & Plan: U/s soft tissue neck focused on supraclavicular, stat pending results.  If infectious process will treat accordingly Dental health: Discussed importance of regular tooth brushing, flossing, and dental visits. Pt advised to:  Please monitor site for worsening signs/symptoms of infection to include: increasing redness, increasing tenderness, increase in size, and or pustulant drainage from site. If this is to occur please let me know immediately.    Orders: -     CBC with Differential/Platelet -     US SOFT TISSUE HEAD & NECK (NON-THYROID); Future  History of breast cancer -     US BREAST COMPLETE UNI LEFT INC AXILLA; Future -     US SOFT TISSUE HEAD & NECK (NON-THYROID); Future   Sore throat with lymphadenopathy, strep ordered negative.  Less likely bacterial uri  Follow up plan: Return if symptoms worsen or fail to improve.  Mort Sawyers, FNP

## 2023-07-28 ENCOUNTER — Ambulatory Visit
Admission: RE | Admit: 2023-07-28 | Discharge: 2023-07-28 | Disposition: A | Discharge status: Home / Self Care | Payer: Managed Care, Other (non HMO) | Source: Ambulatory Visit | Attending: Family

## 2023-07-28 DIAGNOSIS — R590 Localized enlarged lymph nodes: Secondary | ICD-10-CM

## 2023-07-28 DIAGNOSIS — Z853 Personal history of malignant neoplasm of breast: Secondary | ICD-10-CM

## 2023-08-02 ENCOUNTER — Other Ambulatory Visit: Payer: Managed Care, Other (non HMO)

## 2023-08-15 ENCOUNTER — Other Ambulatory Visit: Payer: Self-pay | Admitting: Family

## 2023-08-15 ENCOUNTER — Ambulatory Visit
Admission: RE | Admit: 2023-08-15 | Discharge: 2023-08-15 | Disposition: A | Discharge status: Home / Self Care | Payer: Managed Care, Other (non HMO) | Source: Ambulatory Visit | Attending: Family | Admitting: Family

## 2023-08-15 DIAGNOSIS — Z853 Personal history of malignant neoplasm of breast: Secondary | ICD-10-CM

## 2023-08-15 DIAGNOSIS — I889 Nonspecific lymphadenitis, unspecified: Secondary | ICD-10-CM

## 2023-08-15 DIAGNOSIS — R590 Localized enlarged lymph nodes: Secondary | ICD-10-CM

## 2023-08-15 NOTE — Progress Notes (Signed)
Noted  

## 2023-09-12 ENCOUNTER — Telehealth: Payer: Self-pay

## 2023-09-12 NOTE — Telephone Encounter (Signed)
Mychart message sent to pt to call and schedule a follow up appointment.

## 2023-10-12 ENCOUNTER — Other Ambulatory Visit: Payer: Self-pay | Admitting: Hematology and Oncology

## 2023-10-12 DIAGNOSIS — Z1231 Encounter for screening mammogram for malignant neoplasm of breast: Secondary | ICD-10-CM

## 2023-10-18 ENCOUNTER — Ambulatory Visit
Admission: RE | Admit: 2023-10-18 | Discharge: 2023-10-18 | Disposition: A | Discharge status: Home / Self Care | Payer: Managed Care, Other (non HMO) | Source: Ambulatory Visit | Attending: Hematology and Oncology | Admitting: Hematology and Oncology

## 2023-10-18 DIAGNOSIS — Z1231 Encounter for screening mammogram for malignant neoplasm of breast: Secondary | ICD-10-CM

## 2023-10-23 ENCOUNTER — Ambulatory Visit: Payer: Managed Care, Other (non HMO) | Attending: General Surgery

## 2023-10-23 VITALS — Wt 219.2 lb

## 2023-10-23 DIAGNOSIS — Z483 Aftercare following surgery for neoplasm: Secondary | ICD-10-CM | POA: Insufficient documentation

## 2023-10-23 NOTE — Therapy (Signed)
 OUTPATIENT PHYSICAL THERAPY SOZO SCREENING NOTE   Patient Name: Robin Kennedy MRN: [id number] DOB:[birth year], 48 y.o., female Today's Date: 10/23/2023  PCP: Mort Sawyers, FNP REFERRING PROVIDER: Almond Lint, MD   PT End of Session - 10/23/23 8295     Visit Number 16   # unchanged due to screen only   PT Start Time 0805    PT Stop Time 0810    PT Time Calculation (min) 5 min    Activity Tolerance Patient tolerated treatment well    Behavior During Therapy [facility] for tasks assessed/performed             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   Personal history of chemotherapy    ended 10-07-2020 and did herceptin thru 06-23-2021   Personal history of radiation therapy    01-31-2021   SVD (spontaneous vaginal delivery)    X 3   Past Surgical History:  Procedure Laterality Date   BREAST BIOPSY Left 06/02/2020   x2   BREAST BIOPSY Right 06/22/2020   BREAST BIOPSY Right 10/2020   CYSTOSCOPY N/A 12/20/2016   Procedure: CYSTOSCOPY;  Surgeon: Willodean Rosenthal, MD;  Location: [facility];  Service: Gynecology;  Laterality: N/A;   MASTECTOMY Left    MODIFIED MASTECTOMY Left 11/09/2020   Procedure: LEFT MODIFIED MASTECTOMY WITH LYMPH NODE DISSECTION;  Surgeon: Almond Lint, MD;  Location: [facility];  Service: General;  Laterality: Left;   PORT-A-CATH REMOVAL Left 06/24/2021   Procedure: REMOVAL PORT-A-CATH;  Surgeon: Almond Lint, MD;  Location: [facility];  Service: General;  Laterality: Left;   PORTACATH PLACEMENT Left 06/23/2020   Procedure: INSERTION PORT-A-CATH WITH ULTRASOUND  GUIDANCE;  Surgeon: Almond Lint, MD;  Location: [facility];  Service: General;  Laterality: Left;   RADIOACTIVE SEED GUIDED EXCISIONAL BREAST BIOPSY Right 11/09/2020   Procedure: RIGHT BREAST SEED LOCALIZED EXCISIONAL BIOPSY;  Surgeon: Almond Lint, MD;  Location: [facility];  Service: General;  Laterality: Right;   ROBOTIC ASSISTED TOTAL HYSTERECTOMY WITH BILATERAL SALPINGO OOPHERECTOMY Bilateral 12/20/2016   Procedure: ROBOTIC ASSISTED TOTAL HYSTERECTOMY WITH BILATERAL SALPINGO OOPHORECTOMY;  Surgeon: Willodean Rosenthal, MD;  Location: [facility];  Service: Gynecology;  Laterality: Bilateral;   tooth removal  09/2021   TUBAL LIGATION     WISDOM TOOTH EXTRACTION     Patient Active Problem List   Diagnosis Date Noted   Supraclavicular lymphadenopathy 07/27/2023   Axillary lymphadenitis 07/27/2023   History of breast cancer 07/27/2023   Epidermoid cyst of finger of left hand 04/12/2022   Elevated LDL cholesterol level 03/22/2022   Obesity due to excess calories 09/01/2021   Nontoxic goiter 09/01/2021   Hormone receptor positive breast cancer, left (HCC) 11/09/2020   Malignant neoplasm of upper-inner quadrant of left breast in female, 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   Controlled type 2 diabetes mellitus without complication, with long-term current use of insulin (HCC) 06/19/2012    REFERRING DIAG: right breast cancer at risk for lymphedema  THERAPY DIAG: Aftercare following surgery for neoplasm  PERTINENT HISTORY: Patient was diagnosed on 06/02/2020 with left grade III invasive ductal carcinoma breast cancer. It  is ER positive, PR negative, and HER2 positive with a Ki67 of 60%. She underwent neoadjuvant chemotherapy 06/25/2020-10/07/2020 followed by a right lumpectomy and a left mastectomy with an axillary lymph node dissection (19 negative nodes removed) on 11/09/2020. She has type II diabetes.  PRECAUTIONS: right UE Lymphedema risk,  None  SUBJECTIVE: Pt returns for her 6 month L-Dex screen.   PAIN:  Are you having pain? No  SOZO SCREENING: Patient was assessed today using the SOZO machine to determine the lymphedema index score. This was compared to her baseline score. It was determined that she is within the recommended range when compared to her baseline and no further action is needed at this time. She will continue SOZO screenings. These are done every 3 months for 2 years post operatively followed by every 6 months for 2 years, and then annually. Also advised pt to get a compression bra from [facility] as she reports she has been noticing some chest swelling since returning to work baking cakes. Info was given for her to call them and reach out to nurse navigator for a script. Pt verbalized good understanding.      L-DEX FLOWSHEETS - 10/23/23 0800       L-DEX LYMPHEDEMA SCREENING   Measurement Type Unilateral    L-DEX MEASUREMENT EXTREMITY Upper Extremity    POSITION  Standing    DOMINANT SIDE Right    At Risk Side Left    BASELINE SCORE (UNILATERAL) 4.7    L-DEX SCORE (UNILATERAL) 4.4    VALUE CHANGE (UNILAT) -0.3            P: Pt has one more 6 month screen and then can be annual.    Hermenia Bers, PTA 10/23/2023, 8:10 AM

## 2023-10-25 ENCOUNTER — Encounter: Payer: Self-pay | Admitting: Family

## 2023-10-25 ENCOUNTER — Ambulatory Visit
Admission: RE | Admit: 2023-10-25 | Discharge: 2023-10-25 | Disposition: A | Discharge status: Home / Self Care | Source: Ambulatory Visit | Attending: Family | Admitting: Family

## 2023-10-25 ENCOUNTER — Ambulatory Visit: Admitting: Family

## 2023-10-25 VITALS — BP 110/80 | HR 103 | Temp 98.1°F | Ht 68.0 in | Wt 221.2 lb

## 2023-10-25 DIAGNOSIS — J029 Acute pharyngitis, unspecified: Secondary | ICD-10-CM

## 2023-10-25 DIAGNOSIS — E7800 Pure hypercholesterolemia, unspecified: Secondary | ICD-10-CM

## 2023-10-25 DIAGNOSIS — R590 Localized enlarged lymph nodes: Secondary | ICD-10-CM

## 2023-10-25 DIAGNOSIS — J301 Allergic rhinitis due to pollen: Secondary | ICD-10-CM

## 2023-10-25 DIAGNOSIS — E119 Type 2 diabetes mellitus without complications: Secondary | ICD-10-CM | POA: Diagnosis not present

## 2023-10-25 DIAGNOSIS — R739 Hyperglycemia, unspecified: Secondary | ICD-10-CM

## 2023-10-25 DIAGNOSIS — Z794 Long term (current) use of insulin: Secondary | ICD-10-CM

## 2023-10-25 LAB — CBC WITH DIFFERENTIAL/PLATELET
Basophils Absolute: 0 10*3/uL (ref 0.0–0.1)
Basophils Relative: 0.6 % (ref 0.0–3.0)
Eosinophils Absolute: 0.1 10*3/uL (ref 0.0–0.7)
Eosinophils Relative: 2.1 % (ref 0.0–5.0)
HCT: 40.7 % (ref 36.0–46.0)
Hemoglobin: 12.9 g/dL (ref 12.0–15.0)
Lymphocytes Relative: 39.9 % (ref 12.0–46.0)
Lymphs Abs: 2 10*3/uL (ref 0.7–4.0)
MCHC: 31.6 g/dL (ref 30.0–36.0)
MCV: 84.2 fl (ref 78.0–100.0)
Monocytes Absolute: 0.3 10*3/uL (ref 0.1–1.0)
Monocytes Relative: 6.9 % (ref 3.0–12.0)
Neutro Abs: 2.5 10*3/uL (ref 1.4–7.7)
Neutrophils Relative %: 50.5 % (ref 43.0–77.0)
Platelets: 197 10*3/uL (ref 150.0–400.0)
RBC: 4.84 Mil/uL (ref 3.87–5.11)
RDW: 15.4 % (ref 11.5–15.5)
WBC: 5 10*3/uL (ref 4.0–10.5)

## 2023-10-25 LAB — POCT RAPID STREP A (OFFICE): Rapid Strep A Screen: NEGATIVE

## 2023-10-25 LAB — COMPREHENSIVE METABOLIC PANEL
ALT: 13 U/L (ref 0–35)
AST: 10 U/L (ref 0–37)
Albumin: 4.4 g/dL (ref 3.5–5.2)
Alkaline Phosphatase: 57 U/L (ref 39–117)
BUN: 14 mg/dL (ref 6–23)
CO2: 25 meq/L (ref 19–32)
Calcium: 9.6 mg/dL (ref 8.4–10.5)
Chloride: 107 meq/L (ref 96–112)
Creatinine, Ser: 0.83 mg/dL (ref 0.40–1.20)
GFR: 83.76 mL/min (ref >60.00)
Glucose, Bld: 243 mg/dL — ABNORMAL HIGH (ref 70–99)
Potassium: 3.6 meq/L (ref 3.5–5.1)
Sodium: 141 meq/L (ref 135–145)
Total Bilirubin: 0.5 mg/dL (ref 0.2–1.2)
Total Protein: 7.1 g/dL (ref 6.0–8.3)

## 2023-10-25 LAB — LIPID PANEL
Cholesterol: 125 mg/dL (ref 0–200)
HDL: 55.3 mg/dL (ref >39.00)
LDL Cholesterol: 54 mg/dL (ref 0–99)
NonHDL: 69.44
Total CHOL/HDL Ratio: 2
Triglycerides: 78 mg/dL (ref 0.0–149.0)
VLDL: 15.6 mg/dL (ref 0.0–40.0)

## 2023-10-25 LAB — HEMOGLOBIN A1C: Hgb A1c MFr Bld: 11.9 % — ABNORMAL HIGH (ref 4.6–6.5)

## 2023-10-25 NOTE — Patient Instructions (Signed)
  I have sent in your order electronically for the following: ultrasound neck  at this location below. Please call to schedule the appointment at your convenience  [facility] outpatient imaging center off [address], [place] [address] Phone [phone]-  8-5 pm

## 2023-10-25 NOTE — Assessment & Plan Note (Signed)
Ordered hga1c today pending results. Work on diabetic diet and exercise as tolerated. Yearly foot exam, and annual eye exam.   

## 2023-10-25 NOTE — Assessment & Plan Note (Signed)
 Ordered lipid panel, pending results. Work on low cholesterol diet and exercise as tolerated

## 2023-10-25 NOTE — Progress Notes (Signed)
 Established Patient Office Visit  Subjective:   Patient ID: Robin Kennedy, female    DOB: Jun 20, 1976  Age: 48 y.o. MRN: [id number]  CC:  Chief Complaint  Patient presents with   Medical Management of Chronic Issues    Was seen at the dentist and was told that she needed to be seen by her PCP for a nodule on the right side of her jaw.    HPI: Robin Kennedy is a 48 y.o. female presenting on 10/25/2023 for Medical Management of Chronic Issues (Was seen at the dentist and was told that she needed to be seen by her PCP for a nodule on the right side of her jaw.)  Was sick back end of January for about two weeks and she did notice a lymph node on the right side of her neck but didn't bother her because she was sick. Then she went to the dentist march 6th they palpated the area and advised her to see her pcp because they said it was bigger than it should be.   She does note some tenderness upon palpation. She hasn't been able to pay attention if it grew in size or not. No recent fever. No night sweats other than 'normal' night sweats associated with menopause. No abn weight loss.   Some sore throat but not issues with swallowing. No issues with breathing.  Some slight nasal congestion. No ear pain.  Lots of drainage and mucous production.          ROS: Negative unless specifically indicated above in HPI.   Relevant past medical history reviewed and updated as indicated.   Allergies and medications reviewed and updated.   Current Outpatient Medications:    BD PEN NEEDLE NANO 2ND GEN 32G X 4 MM MISC, USE 4 TIMES A DAY AS DIRECTED, Disp: , Rfl:    Continuous Blood Gluc Sensor (FREESTYLE LIBRE 2 SENSOR) MISC, Inject into the skin every 14 (fourteen) days., Disp: , Rfl:    empagliflozin (JARDIANCE) 10 MG TABS tablet, Take 1 tablet (10 mg total) by mouth daily before breakfast., Disp: 30 tablet, Rfl:    glucose blood (ONE TOUCH ULTRA TEST) test strip, Use to check cbgs  qd, Disp: 100 each, Rfl: 11   insulin aspart (NOVOLOG FLEXPEN) 100 UNIT/ML FlexPen, 3-5u, Disp: , Rfl:    letrozole (FEMARA) 2.5 MG tablet, Take 1 tablet (2.5 mg total) by mouth daily., Disp: 90 tablet, Rfl: 3   rosuvastatin (CRESTOR) 10 MG tablet, Take 10 mg by mouth at bedtime., Disp: , Rfl:    TRESIBA FLEXTOUCH 100 UNIT/ML FlexTouch Pen, Inject 22 Units into the skin daily after breakfast., Disp: , Rfl:   Allergies  Allergen Reactions   Metformin Hcl Other (See Comments)    GI upset    Objective:   BP 110/80 (BP Location: Right Arm, Patient Position: Sitting, Cuff Size: Large)   Pulse (!) 103   Temp 98.1 F (36.7 C) (Temporal)   Ht 5\' 8"  (1.727 m)   Wt 221 lb 3.2 oz (100.3 kg)   LMP 11/06/2016 (Exact Date)   SpO2 97%   BMI 33.63 kg/m    Physical Exam Constitutional:      General: She is not in acute distress.    Appearance: Normal appearance. She is normal weight. She is not ill-appearing, toxic-appearing or diaphoretic.  HENT:     Head: Normocephalic.     Right Ear: Tympanic membrane normal.     Left Ear: Tympanic membrane normal.  Nose: Nose normal.     Right Turbinates: Swollen.     Left Turbinates: Swollen.     Mouth/Throat:     Mouth: Mucous membranes are dry.     Pharynx: Postnasal drip present. No oropharyngeal exudate or posterior oropharyngeal erythema.  Eyes:     Extraocular Movements: Extraocular movements intact.     Pupils: Pupils are equal, round, and reactive to light.  Cardiovascular:     Rate and Rhythm: Normal rate and regular rhythm.     Pulses: Normal pulses.     Heart sounds: Normal heart sounds.  Pulmonary:     Effort: Pulmonary effort is normal.     Breath sounds: Normal breath sounds.  Musculoskeletal:     Cervical back: Normal range of motion.  Lymphadenopathy:     Head:     Right side of head: Submandibular (slight tenderness on palpation) adenopathy present.  Neurological:     General: No focal deficit present.     Mental  Status: She is alert and oriented to person, place, and time. Mental status is at baseline.  Psychiatric:        Mood and Affect: Mood normal.        Behavior: Behavior normal.        Thought Content: Thought content normal.        Judgment: Judgment normal.     Assessment & Plan:  Submandibular lymphadenopathy -     CBC with Differential/Platelet -     US SOFT TISSUE HEAD & NECK (NON-THYROID); Future  Sore throat -     POCT rapid strep A  Hyperglycemia -     Comprehensive metabolic panel  Controlled type 2 diabetes mellitus without complication, with long-term current use of insulin (HCC) Assessment & Plan: Ordered hga1c today pending results. Work on diabetic diet and exercise as tolerated. Yearly foot exam, and annual eye exam.    Orders: -     Hemoglobin A1c -     Microalbumin / creatinine urine ratio  Elevated LDL cholesterol level Assessment & Plan: Ordered lipid panel, pending results. Work on low cholesterol diet and exercise as tolerated   Orders: -     Lipid panel  Supraclavicular lymphadenopathy Assessment & Plan: U/s neck to r/o neoplasm vs suspected reactive lymph node  Symptomatic for allergies     Seasonal allergic rhinitis due to pollen Assessment & Plan: Recommendation for flonase/zyrtec       Follow up plan: Return in about 6 months (around 04/26/2024).  Mort Sawyers, FNP

## 2023-10-25 NOTE — Assessment & Plan Note (Signed)
 Recommendation for flonase/zyrtec

## 2023-10-25 NOTE — Assessment & Plan Note (Signed)
 U/s neck to r/o neoplasm vs suspected reactive lymph node  Symptomatic for allergies

## 2023-10-26 ENCOUNTER — Encounter: Payer: Self-pay | Admitting: Family

## 2023-10-26 LAB — MICROALBUMIN / CREATININE URINE RATIO
Creatinine,U: 56.5 mg/dL
Microalb Creat Ratio: 12.4 mg/g (ref 0.0–30.0)
Microalb, Ur: <0.7 mg/dL

## 2023-10-27 ENCOUNTER — Encounter: Payer: Self-pay | Admitting: Family

## 2023-10-27 ENCOUNTER — Other Ambulatory Visit: Payer: Self-pay | Admitting: Family

## 2023-10-27 DIAGNOSIS — R590 Localized enlarged lymph nodes: Secondary | ICD-10-CM

## 2023-10-30 ENCOUNTER — Encounter: Payer: Self-pay | Admitting: *Deleted

## 2023-10-31 ENCOUNTER — Encounter: Payer: Self-pay | Admitting: Adult Health

## 2023-10-31 ENCOUNTER — Inpatient Hospital Stay: Payer: 59 | Attending: Adult Health | Admitting: Adult Health

## 2023-10-31 VITALS — BP 141/81 | HR 85 | Temp 98.0°F | Resp 16 | Ht 68.0 in | Wt 222.9 lb

## 2023-10-31 DIAGNOSIS — Z13820 Encounter for screening for osteoporosis: Secondary | ICD-10-CM

## 2023-10-31 DIAGNOSIS — C50212 Malignant neoplasm of upper-inner quadrant of left female breast: Secondary | ICD-10-CM | POA: Diagnosis not present

## 2023-10-31 DIAGNOSIS — Z1721 Progesterone receptor positive status: Secondary | ICD-10-CM | POA: Diagnosis not present

## 2023-10-31 DIAGNOSIS — C50912 Malignant neoplasm of unspecified site of left female breast: Secondary | ICD-10-CM | POA: Insufficient documentation

## 2023-10-31 DIAGNOSIS — Z79899 Other long term (current) drug therapy: Secondary | ICD-10-CM | POA: Diagnosis not present

## 2023-10-31 DIAGNOSIS — Z9221 Personal history of antineoplastic chemotherapy: Secondary | ICD-10-CM | POA: Insufficient documentation

## 2023-10-31 DIAGNOSIS — Z1732 Human epidermal growth factor receptor 2 negative status: Secondary | ICD-10-CM | POA: Insufficient documentation

## 2023-10-31 DIAGNOSIS — Z170 Estrogen receptor positive status [ER+]: Secondary | ICD-10-CM

## 2023-10-31 DIAGNOSIS — Z9012 Acquired absence of left breast and nipple: Secondary | ICD-10-CM | POA: Insufficient documentation

## 2023-10-31 DIAGNOSIS — Z803 Family history of malignant neoplasm of breast: Secondary | ICD-10-CM | POA: Diagnosis not present

## 2023-10-31 DIAGNOSIS — R591 Generalized enlarged lymph nodes: Secondary | ICD-10-CM | POA: Diagnosis not present

## 2023-10-31 DIAGNOSIS — E2839 Other primary ovarian failure: Secondary | ICD-10-CM

## 2023-10-31 DIAGNOSIS — Z923 Personal history of irradiation: Secondary | ICD-10-CM | POA: Insufficient documentation

## 2023-10-31 DIAGNOSIS — R519 Headache, unspecified: Secondary | ICD-10-CM | POA: Diagnosis not present

## 2023-10-31 DIAGNOSIS — E119 Type 2 diabetes mellitus without complications: Secondary | ICD-10-CM | POA: Insufficient documentation

## 2023-10-31 DIAGNOSIS — Z79811 Long term (current) use of aromatase inhibitors: Secondary | ICD-10-CM

## 2023-10-31 NOTE — Progress Notes (Unsigned)
 SURVIVORSHIP VISIT:  BRIEF ONCOLOGIC HISTORY:  Oncology History  Malignant neoplasm of upper-inner quadrant of left breast in female, estrogen receptor positive (HCC)  06/05/2020 Initial Diagnosis   06/05/2020: Enlarging left breast with pain and diffuse skin thickening: Mammogram revealed 3.5 cm mass 11 o'clock position with 3 abnormal lymph nodes: 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 Right breast benign-appearing calcifications previous stereotactic biopsy was attempted but it was too superficial and felt to be benign.   06/10/2020 Cancer Staging   Staging form: Breast, AJCC 8th Edition - Clinical stage from 06/10/2020: Stage IIIB (cT4d, cN3c, cM0, G3, ER+, PR-, HER2+) - Signed by Lonie Peak, MD on 12/22/2020 Stage prefix: Initial diagnosis Histologic grading system: 3 grade system Laterality: Left Staged by: Pathologist and managing physician Stage used in treatment planning: Yes National guidelines used in treatment planning: Yes Type of national guideline used in treatment planning: NCCN   06/25/2020 - 10/09/2020 Chemotherapy   Neoadjuvant chemotherapy with [facility] Perjeta 6 cycles followed by Herceptin Perjeta maintenance for 1 year       06/29/2020 Genetic Testing   Negative genetic testing: no pathogenic variants detected in [facility] Common Hereditary Cancers Panel.  The report date is June 29, 2020.   The Common Hereditary Cancers Panel offered by [facility] includes sequencing and/or deletion duplication testing of the following 48 genes: APC, ATM, AXIN2, BARD1, BMPR1A, BRCA1, BRCA2, BRIP1, CDH1, CDK4, CDKN2A (p14ARF), CDKN2A (p16INK4a), CHEK2, CTNNA1, DICER1, EPCAM (Deletion/duplication testing only), GREM1 (promoter region deletion/duplication testing only), KIT, MEN1, MLH1, MSH2, MSH3, MSH6, MUTYH, NBN, NF1, NHTL1, PALB2, PDGFRA, PMS2, POLD1, POLE, PTEN, RAD50, RAD51C, RAD51D, RNF43, SDHB, SDHC, SDHD, SMAD4, SMARCA4. STK11, TP53,  TSC1, TSC2, and VHL.  The following genes were evaluated for sequence changes only: SDHA and HOXB13 c.251G>A variant only.   10/28/2020 - 06/23/2021 Chemotherapy   Patient is on Treatment Plan : BREAST Trastuzumab + Pertuzumab q21d     11/09/2020 Surgery   Right lumpectomy and left mastectomy Donell Beers):  Right breast: no evidence of malignancy Left breast: no residual carcinoma, with 19 left axillary lymph nodes negative for carcinoma.   11/09/2020 Cancer Staging   Staging form: Breast, AJCC 8th Edition - Pathologic stage from 11/09/2020: No Stage Recommended (ypT0, pN0, cM0) - Signed by Loa Socks, NP on 08/23/2021 Stage prefix: Post-therapy   12/29/2020 - 02/09/2021 Radiation Therapy   Adjuvant radiation 12/29/2020 through 02/09/2021 Site Technique Total Dose (Gy) Dose per Fx (Gy) Completed Fx Beam Energies  Chest Wall, Left: CW_Lt_IMN 3D 50/50 2 25/25 6X  Chest Wall, Left: CW_Lt_SCV_PAB 3D 50/50 2 25/25 6X, 15X  Chest Wall, Left: CW_Lt_Bst Electron 10/10 2 5/5 6E     04/2021 -  Anti-estrogen oral therapy   Letrozole daily     INTERVAL HISTORY:  Robin Kennedy to review her survivorship care plan detailing her treatment course for breast cancer, as well as monitoring long-term side effects of that treatment, education regarding health maintenance, screening, and overall wellness and health promotion.     Overall, Robin Kennedy reports feeling quite well.  She is taking letrozole daily with good tolerance.    She also has a h/o diabetes.  The patient has been trying to manage her diabetes through a plant-based diet but has noticed that her blood sugar levels increase when she deviates from this diet.   She notes a slightly enlarged lymph node that has been present for six weeks. The lymph node was discovered during  a dental procedure and has not changed in size. The patient denies any difficulty swallowing but reports an increase in mucus production. The patient also reports  an unusual headache, which has been present for a few days. She was referred to ENT for further evaluation of the lymphadenopathy after ultrasound performed on 10/15/2023 demonstrated a 1.2 cm enlarged submandibular lymphadenopathy.   REVIEW OF SYSTEMS:  Review of Systems  Constitutional:  Negative for appetite change, chills, fatigue, fever and unexpected weight change.  HENT:   Negative for hearing loss, lump/mass, mouth sores and trouble swallowing.   Eyes:  Negative for eye problems and icterus.  Respiratory:  Negative for chest tightness, cough and shortness of breath.   Cardiovascular:  Negative for chest pain, leg swelling and palpitations.  Gastrointestinal:  Negative for abdominal distention, abdominal pain, constipation, diarrhea, nausea and vomiting.  Endocrine: Negative for hot flashes.  Genitourinary:  Negative for difficulty urinating.   Musculoskeletal:  Negative for arthralgias.  Skin:  Negative for itching and rash.  Neurological:  Negative for dizziness, extremity weakness, headaches and numbness.  Hematological:  Positive for adenopathy. Does not bruise/bleed easily.  Psychiatric/Behavioral:  Negative for depression. The patient is not nervous/anxious.   Breast: Denies any new nodularity, masses, tenderness, nipple changes, or nipple discharge.       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   Personal history of chemotherapy    ended 10-07-2020 and did herceptin thru 06-23-2021   Personal history of radiation therapy    01-31-2021   SVD (spontaneous vaginal delivery)     X 3   Past Surgical History:  Procedure Laterality Date   BREAST BIOPSY Left 06/02/2020   x2   BREAST BIOPSY Right 06/22/2020   BREAST BIOPSY Right 10/2020   CYSTOSCOPY N/A 12/20/2016   Procedure: CYSTOSCOPY;  Surgeon: Willodean Rosenthal, MD;  Location: [facility];  Service: Gynecology;  Laterality: N/A;   MASTECTOMY Left    MODIFIED MASTECTOMY Left 11/09/2020   Procedure: LEFT MODIFIED MASTECTOMY WITH LYMPH NODE DISSECTION;  Surgeon: Almond Lint, MD;  Location: [facility];  Service: General;  Laterality: Left;   PORT-A-CATH REMOVAL Left 06/24/2021   Procedure: REMOVAL PORT-A-CATH;  Surgeon: Almond Lint, MD;  Location: [facility];  Service: General;  Laterality: Left;   PORTACATH PLACEMENT Left 06/23/2020   Procedure: INSERTION PORT-A-CATH WITH ULTRASOUND GUIDANCE;  Surgeon: Almond Lint, MD;  Location: [facility];  Service: General;  Laterality: Left;   RADIOACTIVE SEED GUIDED EXCISIONAL BREAST BIOPSY Right 11/09/2020   Procedure: RIGHT BREAST SEED LOCALIZED EXCISIONAL BIOPSY;  Surgeon: Almond Lint, MD;  Location:  SURGERY CENTER;  Service: General;  Laterality: Right;   ROBOTIC ASSISTED TOTAL HYSTERECTOMY WITH BILATERAL SALPINGO OOPHERECTOMY Bilateral 12/20/2016   Procedure: ROBOTIC ASSISTED TOTAL HYSTERECTOMY WITH BILATERAL SALPINGO OOPHORECTOMY;  Surgeon: Willodean Rosenthal, MD;  Location: [facility];  Service: Gynecology;  Laterality: Bilateral;   tooth removal  09/2021   TUBAL LIGATION     WISDOM TOOTH EXTRACTION       ALLERGIES:  Allergies  Allergen Reactions   Metformin Hcl Other (See Comments)  GI upset     CURRENT MEDICATIONS:  Outpatient Encounter Medications as of 10/31/2023  Medication Sig   BD PEN NEEDLE NANO 2ND GEN 32G X 4 MM MISC USE 4 TIMES A DAY AS DIRECTED   Continuous Blood Gluc Sensor (FREESTYLE LIBRE 2 SENSOR) MISC Inject into the skin every 14 (fourteen) days.   empagliflozin (JARDIANCE) 10 MG TABS tablet  Take 1 tablet (10 mg total) by mouth daily before breakfast.   glucose blood (ONE TOUCH ULTRA TEST) test strip Use to check cbgs qd   insulin aspart (NOVOLOG FLEXPEN) 100 UNIT/ML FlexPen 3-5u   Insulin Glargine (TOUJEO MAX SOLOSTAR [place]) Inject 30 Units into the skin daily.   letrozole (FEMARA) 2.5 MG tablet Take 1 tablet (2.5 mg total) by mouth daily.   rosuvastatin (CRESTOR) 10 MG tablet Take 10 mg by mouth at bedtime.   [DISCONTINUED] TRESIBA FLEXTOUCH 100 UNIT/ML FlexTouch Pen Inject 22 Units into the skin daily after breakfast. (Patient not taking: Reported on 10/31/2023)   No facility-administered encounter medications on file as of 10/31/2023.     ONCOLOGIC FAMILY HISTORY:  Family History  Problem Relation Age of Onset   Sarcoidosis Mother    Colon polyps Brother    Diabetes Brother    Hyperlipidemia Maternal Aunt    Stroke Maternal Uncle    Stroke Paternal Grandmother    Breast cancer Paternal Grandmother        unknown age   Asthma Daughter    GER disease Daughter    Colon cancer Neg Hx    Esophageal cancer Neg Hx    Rectal cancer Neg Hx    Stomach cancer Neg Hx      SOCIAL HISTORY:  Social History   Socioeconomic History   Marital status: Married    Spouse name: Not on file   Number of children: Not on file   Years of education: Not on file   Highest education level: Bachelor's degree (e.g., BA, AB, BS)  Occupational History    Comment: part time cake decorator  at [employer]  Tobacco Use   Smoking status: Never   Smokeless tobacco: Never  Vaping Use   Vaping status: Never Used  Substance and Sexual Activity   Alcohol use: No   Drug use: No   Sexual activity: Yes    Partners: Male    Birth control/protection: Surgical  Other Topics Concern   Not on file  Social History Narrative   ** Merged History Encounter **       Social Drivers of Health   Financial Resource Strain: Low Risk  (10/19/2023)   Overall Financial Resource Strain (CARDIA)     Difficulty of Paying Living Expenses: Not hard at all  Food Insecurity: No Food Insecurity (10/19/2023)   Hunger Vital Sign    Worried About Running Out of Food in the Last Year: Never true    Ran Out of Food in the Last Year: Never true  Transportation Needs: No Transportation Needs (10/19/2023)   PRAPARE - Administrator, Civil Service (Medical): No    Lack of Transportation (Non-Medical): No  Physical Activity: Insufficiently Active (10/19/2023)   Exercise Vital Sign    Days of Exercise per Week: 3 days    Minutes of Exercise per Session: 30 min  Stress: No Stress Concern Present (10/19/2023)   [employer] of Occupational Health - Occupational Stress Questionnaire    Feeling of Stress : Only a little  Social Connections: Socially Integrated (10/19/2023)  Social Advertising account executive [NHANES]    Frequency of Communication with Friends and Family: More than three times a week    Frequency of Social Gatherings with Friends and Family: Twice a week    Attends Religious Services: More than 4 times per year    Active Member of [employer] or Organizations: Yes    Attends Engineer, structural: More than 4 times per year    Marital Status: Married  Catering manager Violence: Not At Risk (09/01/2021)   Humiliation, Afraid, Rape, and Kick questionnaire    Fear of Current or Ex-Partner: No    Emotionally Abused: No    Physically Abused: No    Sexually Abused: No     OBSERVATIONS/OBJECTIVE:  BP (!) 141/81 (BP Location: Left Arm, Patient Position: Sitting)   Pulse 85   Temp 98 F (36.7 C) (Temporal)   Resp 16   Ht 5\' 8"  (1.727 m)   Wt 222 lb 14.4 oz (101.1 kg)   LMP 11/06/2016 (Exact Date)   SpO2 99%   BMI 33.89 kg/m  GENERAL: Patient is a well appearing female in no acute distress HEENT:  Sclerae anicteric.  Oropharynx clear and moist. No ulcerations or evidence of oropharyngeal candidiasis. Neck is supple.  NODES:  No cervical, supraclavicular, or axillary  lymphadenopathy palpated.  BREAST EXAM: Breast status postmastectomy and radiation no sign of local recurrence right breast status postlumpectomy, benign on LUNGS:  Clear to auscultation bilaterally.  No wheezes or rhonchi. HEART:  Regular rate and rhythm. No murmur appreciated. ABDOMEN:  Soft, nontender.  Positive, normoactive bowel sounds. No organomegaly palpated. MSK:  No focal spinal tenderness to palpation. Full range of motion bilaterally in the upper extremities. EXTREMITIES:  No peripheral edema.   SKIN:  Clear with no obvious rashes or skin changes. No nail dyscrasia. NEURO:  Nonfocal. Well oriented.  Appropriate affect.   LABORATORY DATA:  None for this visit.  DIAGNOSTIC IMAGING:  None for this visit.      ASSESSMENT AND PLAN:  Ms.. Kennedy is a pleasant 48 y.o. 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  She presents to the Survivorship Clinic for our initial meeting and routine follow-up post-completion of treatment for breast cancer.    1. Stage 3B left breast cancer:  Robin Kennedy is continuing to recover from definitive treatment for breast cancer. She will follow-up with her medical oncologist, Dr. Pamelia Hoit in 1 year with history and physical exam per surveillance protocol.  She will continue her anti-estrogen therapy with letrozole. Thus far, she is tolerating the letrozole well, with minimal side effects. Her mammogram is due 10/2024; orders placed today.   Today, a comprehensive survivorship care plan and treatment summary was reviewed with the patient today detailing her breast cancer diagnosis, treatment course, potential late/long-term effects of treatment, appropriate follow-up care with recommendations for the  future, and patient education resources.  A copy of this summary, along with a letter will be sent to the patient's primary care provider via mail/fax/In Basket message after today's visit.    2. Right submandibular lymphadenopathy: Evaluation with ENT pending.   3. Headaches: Recent onset.  Patient will monitor.  If they worsen or continue, will obtain imaging.  She knows to call us if they don't resolve.   4. Bone health:  Given Ms. Oliveto's age/history  of breast cancer and her current treatment regimen including anti-estrogen therapy with Letrozole, she is at risk for bone demineralization.  Her last DEXA scan was 01/2022, which was normal.  Repeat is recommended every 2 years--orders placed for 01/2024 repeat.  I She was given education on specific activities to promote bone health.  5. Cancer screening:  Due to Ms. Moorer's history and her age, she should receive screening for skin cancers, colon cancer, and gynecologic cancers.  The information and recommendations are listed on the patient's comprehensive care plan/treatment summary and were reviewed in detail with the patient.    6. Health maintenance and wellness promotion: Ms. Soohoo was encouraged to consume 5-7 servings of fruits and vegetables per day. We reviewed the "Nutrition Rainbow" handout.  She was also encouraged to engage in moderate to vigorous exercise for 30 minutes per day most days of the week.  She was instructed to limit her alcohol consumption and continue to abstain from tobacco use.     7. Support services/counseling: It is not uncommon for this period of the patient's cancer care trajectory to be one of many emotions and stressors.   She was given information regarding our available services and encouraged to contact me with any questions or for help enrolling in any of our support group/programs.    Follow up instructions:    -Return to cancer center in 6 months for f/u with Dr. Pamelia Hoit  -Mammogram due  in 10/2024 -Bone density testing in 01/2024 -She is welcome to return back to the Survivorship Clinic at any time; no additional follow-up needed at this time.  -Consider referral back to survivorship as a long-term survivor for continued surveillance  The patient was provided an opportunity to ask questions and all were answered. The patient agreed with the plan and demonstrated an understanding of the instructions.   Total encounter time:45 minutes*in face-to-face visit time, chart review, lab review, care coordination, order entry, and documentation of the encounter time.    Lillard Anes, NP 11/01/23 1:42 PM Medical Oncology and Hematology [facility] [address] [place], [place] [address] Tel. [phone]    Fax. [phone]  *Total Encounter Time as defined by the Centers for Medicare and Medicaid Services includes, in addition to the face-to-face time of a patient visit (documented in the note above) non-face-to-face time: obtaining and reviewing outside history, ordering and reviewing medications, tests or procedures, care coordination (communications with other health care professionals or caregivers) and documentation in the medical record.

## 2023-11-01 ENCOUNTER — Telehealth: Payer: Self-pay | Admitting: Adult Health

## 2023-11-01 NOTE — Telephone Encounter (Signed)
 Scheduled appointment per 3/18 los. Talked with the patient and she is aware of the made appointment.

## 2023-11-06 ENCOUNTER — Encounter: Payer: Self-pay | Admitting: Adult Health

## 2023-11-06 ENCOUNTER — Encounter: Payer: Self-pay | Admitting: Family

## 2023-11-09 ENCOUNTER — Ambulatory Visit (INDEPENDENT_AMBULATORY_CARE_PROVIDER_SITE_OTHER): Admitting: Otolaryngology

## 2023-11-09 ENCOUNTER — Encounter (INDEPENDENT_AMBULATORY_CARE_PROVIDER_SITE_OTHER): Payer: Self-pay

## 2023-11-09 VITALS — BP 148/88 | HR 104 | Resp 92 | Wt 220.0 lb

## 2023-11-09 DIAGNOSIS — R591 Generalized enlarged lymph nodes: Secondary | ICD-10-CM

## 2023-11-09 MED ORDER — AMOXICILLIN-POT CLAVULANATE 875-125 MG PO TABS: 1.0000 | ORAL_TABLET | Freq: Two times a day (BID) | ORAL | 0 refills | Status: AC

## 2023-11-09 NOTE — Patient Instructions (Signed)
 Take Augmentin 875 mg by mouth (PO) twice daily for 10 days; take with food, take probiotic or yogurt with it Call earlier if size increases, becomes painful, feel other nodes, or have ear pain or other issues

## 2023-11-09 NOTE — Progress Notes (Signed)
 Dear Dr. Orlin Black, Here is my assessment for our mutual patient, [facility]. Thank you for allowing me the opportunity to care for your patient. Please do not hesitate to contact me should you have any other questions. Sincerely, Dr. Milon Aloe  Otolaryngology Clinic Note Referring provider: Dr. Orlin Black HPI:  Robin Kennedy is a 48 y.o. female kindly referred by Dr. Orlin Black for evaluation of submandibular lymphadenopathy.  Initial visit (10/2023): Noticed it first in February 2025, dentist noticed it and advised checking. Did have a URI right prior to noticing it ("cold" - cough, congestion, runny nose). She does report that it is about the same in size. No pain, no ear pain, dysphagia, odynophagia, hoarseness, SOB, hemoptysis, unintentional weight loss. No facial numbness. Does have hot flashes (going through menopause). No dental infections or issues.   No antibiotics for this, has not tried anything. Never had it biopsied. She has had a US  for this on 10/26/2023.   H&N Surgery: no Personal or FHx of bleeding dz or anesthesia difficulty: no  GLP-1: no AP/AC: no  Tobacco: no. Alcohol: no  PMHx: HLD, T2DM, Breast cancer  Independent Review of Additional Tests or Records:  CBC 10/25/2023: WBC 5.0, Hgb 12.9, Plt 197 Felicita Horns FNP notes 10/25/2023: seen at dentist, nodule right side of her jaw after had a URI in Jan; some tenderness on palpatoin; no B symptoms; Dx: submandibular LAD; Rx: CBC and US  Neck 10/25/2023 independently reviewed:   Fatty hilum preserved PMH/Meds/All/SocHx/FamHx/ROS:   Past Medical History:  Diagnosis Date   Breast cancer metastasized to axillary lymph node, left (HCC) 11/09/2020   Cancer (HCC) 06/05/2020   left breast   Diabetes mellitus without complication (HCC)    Fibroids 12/06/2016   Malignant neoplasm of upper-inner quadrant of left breast in female, 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   Neuromuscular disorder (HCC)    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   Personal history of chemotherapy    ended 10-07-2020 and did herceptin thru 06-23-2021   Personal history of radiation therapy    01-31-2021   SVD (spontaneous vaginal delivery)    X 3     Past Surgical History:  Procedure Laterality Date   BREAST BIOPSY Left 06/02/2020   x2   BREAST BIOPSY Right 06/22/2020   BREAST BIOPSY Right 10/2020   CYSTOSCOPY N/A 12/20/2016   Procedure: CYSTOSCOPY;  Surgeon: Lenord Radon, MD;  Location: [facility];  Service: Gynecology;  Laterality: N/A;   MASTECTOMY Left    MODIFIED MASTECTOMY Left 11/09/2020   Procedure: LEFT MODIFIED MASTECTOMY WITH LYMPH NODE DISSECTION;  Surgeon: Lockie Rima, MD;  Location: [facility];  Service: General;  Laterality: Left;   PORT-A-CATH REMOVAL Left 06/24/2021   Procedure: REMOVAL PORT-A-CATH;  Surgeon: Lockie Rima, MD;  Location: [facility];  Service: General;  Laterality: Left;   PORTACATH PLACEMENT Left 06/23/2020   Procedure: INSERTION PORT-A-CATH WITH ULTRASOUND GUIDANCE;  Surgeon: Lockie Rima, MD;  Location: [facility];  Service: General;  Laterality: Left;   RADIOACTIVE SEED GUIDED EXCISIONAL BREAST BIOPSY Right 11/09/2020   Procedure: RIGHT BREAST SEED LOCALIZED EXCISIONAL BIOPSY;  Surgeon: Lockie Rima, MD;  Location: [facility];  Service: General;  Laterality: Right;   ROBOTIC ASSISTED TOTAL HYSTERECTOMY WITH BILATERAL SALPINGO OOPHERECTOMY Bilateral 12/20/2016   Procedure: ROBOTIC ASSISTED TOTAL HYSTERECTOMY WITH BILATERAL SALPINGO OOPHORECTOMY;  Surgeon: Lenord Radon, MD;  Location: [facility];  Service: Gynecology;  Laterality: Bilateral;   tooth removal  09/2021   TUBAL  LIGATION     WISDOM TOOTH EXTRACTION      Family History  Problem Relation Age of Onset   Sarcoidosis Mother    Colon polyps Brother    Diabetes Brother    Hyperlipidemia Maternal Aunt    Stroke Maternal Uncle    Stroke Paternal Grandmother     Breast cancer Paternal Grandmother        unknown age   Asthma Daughter    GER disease Daughter    Colon cancer Neg Hx    Esophageal cancer Neg Hx    Rectal cancer Neg Hx    Stomach cancer Neg Hx      Social Connections: Socially Integrated (10/19/2023)   Social Connection and Isolation Panel [NHANES]    Frequency of Communication with Friends and Family: More than three times a week    Frequency of Social Gatherings with Friends and Family: Twice a week    Attends Religious Services: More than 4 times per year    Active Member of [employer] or Organizations: Yes    Attends Engineer, structural: More than 4 times per year    Marital Status: Married      Current Outpatient Medications:    BD PEN NEEDLE NANO 2ND GEN 32G X 4 MM MISC, USE 4 TIMES A DAY AS DIRECTED, Disp: , Rfl:    Continuous Blood Gluc Sensor (FREESTYLE LIBRE 2 SENSOR) MISC, Inject into the skin every 14 (fourteen) days., Disp: , Rfl:    empagliflozin (JARDIANCE) 10 MG TABS tablet, Take 1 tablet (10 mg total) by mouth daily before breakfast., Disp: 30 tablet, Rfl:    glucose blood (ONE TOUCH ULTRA TEST) test strip, Use to check cbgs qd, Disp: 100 each, Rfl: 11   insulin aspart (NOVOLOG FLEXPEN) 100 UNIT/ML FlexPen, 3-5u, Disp: , Rfl:    Insulin Glargine (TOUJEO MAX SOLOSTAR [place]), Inject 30 Units into the skin daily., Disp: , Rfl:    letrozole (FEMARA) 2.5 MG tablet, Take 1 tablet (2.5 mg total) by mouth daily., Disp: 90 tablet, Rfl: 3   rosuvastatin (CRESTOR) 10 MG tablet, Take 10 mg by mouth at bedtime., Disp: , Rfl:    Physical Exam:   BP (!) 148/88 (BP Location: Right Arm, Patient Position: Sitting, Cuff Size: Large)   Pulse (!) 104   Resp (!) 92   Wt 220 lb (99.8 kg)   LMP 11/06/2016 (Exact Date)   BMI 33.45 kg/m   Salient findings:  CN II-XII intact  Bilateral EAC clear and TM intact with well pneumatized middle ear spaces Anterior rhinoscopy: Septum relatively midline; bilateral inferior turbinates  without significant hypertrophy No lesions of oral cavity/oropharynx No respiratory distress or stridor Right perifacial node - close to angle of mandible, soft, mobile, well demarcated; no other palpable lymphadenopathy  Seprately Identifiable Procedures:  None today  Impression & Plans:  Robin Kennedy is a 48 y.o. female with h/o breast cancer now with:  1. Head and neck lymphadenopathy   Noted right submandibular lymph node after a URI; has not changed in size; US  seems to show fatty hilum preserved; no history of smoking We discussed options including: medical (abx), biopsy, further workup with imaging She opted for antibiotics; if it persists, would recommend FNA v/s excisional biopsy  Start Augmentin BIDx10d F/u 8 weeks, sooner if any concerns or necessary  See below regarding exact medications prescribed this encounter including dosages and route: Meds ordered this encounter  Medications   amoxicillin-clavulanate (AUGMENTIN) 875-125 MG tablet  Sig: Take 1 tablet by mouth 2 (two) times daily for 10 days.    Dispense:  20 tablet    Refill:  0      Thank you for allowing me the opportunity to care for your patient. Please do not hesitate to contact me should you have any other questions.  Sincerely, Milon Aloe, MD Otolaryngologist (ENT), [facility] Phone: [phone] Fax: [phone]  11/26/2023, 4:32 PM   MDM:  Level 4 Complexity/Problems addressed: mod - new problem, unknown prognosis Data complexity: mod - independent review of notes, labs; independent interpretation of US  - Morbidity: mod  - Prescription Drug prescribed or managed: yes

## 2023-12-17 ENCOUNTER — Other Ambulatory Visit: Payer: Self-pay | Admitting: Hematology and Oncology

## 2024-02-09 ENCOUNTER — Encounter (INDEPENDENT_AMBULATORY_CARE_PROVIDER_SITE_OTHER): Payer: Self-pay | Admitting: Otolaryngology

## 2024-02-09 ENCOUNTER — Ambulatory Visit (INDEPENDENT_AMBULATORY_CARE_PROVIDER_SITE_OTHER): Admitting: Otolaryngology

## 2024-02-09 VITALS — BP 128/81 | HR 94 | Ht 68.0 in | Wt 216.0 lb

## 2024-02-09 DIAGNOSIS — R591 Generalized enlarged lymph nodes: Secondary | ICD-10-CM | POA: Diagnosis not present

## 2024-02-09 NOTE — Patient Instructions (Signed)
 I have ordered an imaging study for you to complete prior to your next visit. Please call [facility] Scheduling at [phone] to schedule your imaging if you have not received a call within 24 hours. If you are unable to complete your imaging study prior to your next scheduled visit please call our office to let us know.

## 2024-02-09 NOTE — Progress Notes (Signed)
 Dear Dr. Corwin, Here is my assessment for our mutual patient, Robin Kennedy. Thank you for allowing me the opportunity to care for your patient. Please do not hesitate to contact me should you have any other questions. Sincerely, Dr. Eldora Blanch  Otolaryngology Clinic Note Referring provider: Dr. Corwin HPI:  Robin Kennedy is a 48 y.o. female kindly referred by Dr. Corwin for evaluation of submandibular lymphadenopathy.  Initial visit (10/2023): Noticed it first in February 2025, dentist noticed it and advised checking. Did have a URI right prior to noticing it (cold - cough, congestion, runny nose). She does report that it is about the same in size. No pain, no ear pain, dysphagia, odynophagia, hoarseness, SOB, hemoptysis, unintentional weight loss. No facial numbness. Does have hot flashes (going through menopause). No dental infections or issues.   No antibiotics for this, has not tried anything. Never had it biopsied. She has had a US  for this on 10/26/2023.   --------------------------------------------------------- 02/09/2024 Persistent lymphadenopathy; no additional adenopathy. No recent URI since last visit. No dysphagia, ear pain, odynophagia, hoarseness, hoarseness, SOB, hemoptysis, unintentional weight loss, skin cancers. No dental issues. Antibiotics did not help.   H&N Surgery: no Personal or FHx of bleeding dz or anesthesia difficulty: no  GLP-1: no AP/AC: no  Tobacco: no. Alcohol: no  PMHx: HLD, T2DM, Breast cancer  Independent Review of Additional Tests or Records:  CBC 10/25/2023: WBC 5.0, Hgb 12.9, Plt 197 Robin Corwin FNP notes 10/25/2023: seen at dentist, nodule right side of her jaw after had a URI in Jan; some tenderness on palpatoin; no B symptoms; Dx: submandibular LAD; Rx: CBC and US  Neck 10/25/2023 independently reviewed:   Fatty hilum preserved PMH/Meds/All/SocHx/FamHx/ROS:   Past Medical History:  Diagnosis Date   Breast cancer  metastasized to axillary lymph node, left (HCC) 11/09/2020   Cancer (HCC) 06/05/2020   left breast   Diabetes mellitus without complication (HCC)    Fibroids 12/06/2016   Malignant neoplasm of upper-inner quadrant of left breast in female, 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   Neuromuscular disorder (HCC)    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   Personal history of chemotherapy    ended 10-07-2020 and did herceptin  thru 06-23-2021   Personal history of radiation therapy    01-31-2021   SVD (spontaneous vaginal delivery)    X 3     Past Surgical History:  Procedure Laterality Date   BREAST BIOPSY Left 06/02/2020   x2   BREAST BIOPSY Right 06/22/2020   BREAST BIOPSY Right 10/2020   CYSTOSCOPY N/A 12/20/2016   Procedure: CYSTOSCOPY;  Surgeon: Corene Coy, MD;  Location: [facility];  Service: Gynecology;  Laterality: N/A;   MASTECTOMY Left    MODIFIED MASTECTOMY Left 11/09/2020   Procedure: LEFT MODIFIED MASTECTOMY WITH LYMPH NODE DISSECTION;  Surgeon: Aron Shoulders, MD;  Location: [facility];  Service: General;  Laterality: Left;   PORT-A-CATH REMOVAL Left 06/24/2021   Procedure: REMOVAL PORT-A-CATH;  Surgeon: Aron Shoulders, MD;  Location: [facility];  Service: General;  Laterality: Left;   PORTACATH PLACEMENT Left 06/23/2020   Procedure: INSERTION PORT-A-CATH WITH ULTRASOUND GUIDANCE;  Surgeon: Aron Shoulders, MD;  Location: [facility];  Service: General;  Laterality: Left;   RADIOACTIVE SEED GUIDED EXCISIONAL BREAST BIOPSY Right 11/09/2020   Procedure: RIGHT BREAST SEED LOCALIZED EXCISIONAL BIOPSY;  Surgeon: Aron Shoulders, MD;  Location: [facility];  Service: General;  Laterality: Right;   ROBOTIC ASSISTED TOTAL HYSTERECTOMY WITH BILATERAL SALPINGO OOPHERECTOMY Bilateral  12/20/2016   Procedure: ROBOTIC ASSISTED TOTAL HYSTERECTOMY WITH BILATERAL SALPINGO OOPHORECTOMY;  Surgeon: Corene Coy, MD;  Location: [facility];   Service: Gynecology;  Laterality: Bilateral;   tooth removal  09/2021   TUBAL LIGATION     WISDOM TOOTH EXTRACTION      Family History  Problem Relation Age of Onset   Sarcoidosis Mother    Colon polyps Brother    Diabetes Brother    Hyperlipidemia Maternal Aunt    Stroke Maternal Uncle    Stroke Paternal Grandmother    Breast cancer Paternal Grandmother        unknown age   Asthma Daughter    GER disease Daughter    Colon cancer Neg Hx    Esophageal cancer Neg Hx    Rectal cancer Neg Hx    Stomach cancer Neg Hx      Social Connections: Socially Integrated (10/19/2023)   Social Connection and Isolation Panel    Frequency of Communication with Friends and Family: More than three times a week    Frequency of Social Gatherings with Friends and Family: Twice a week    Attends Religious Services: More than 4 times per year    Active Member of [employer] or Organizations: Yes    Attends Engineer, structural: More than 4 times per year    Marital Status: Married      Current Outpatient Medications:    BD PEN NEEDLE NANO 2ND GEN 32G X 4 MM MISC, USE 4 TIMES A DAY AS DIRECTED, Disp: , Rfl:    Continuous Blood Gluc Sensor (FREESTYLE LIBRE 2 SENSOR) MISC, Inject into the skin every 14 (fourteen) days., Disp: , Rfl:    empagliflozin  (JARDIANCE ) 10 MG TABS tablet, Take 1 tablet (10 mg total) by mouth daily before breakfast., Disp: 30 tablet, Rfl:    glucose blood (ONE TOUCH ULTRA TEST) test strip, Use to check cbgs qd, Disp: 100 each, Rfl: 11   HUMALOG KWIKPEN 100 UNIT/ML KwikPen, SMARTSIG:Unit(s) SUB-Q, Disp: , Rfl:    insulin  aspart (NOVOLOG FLEXPEN) 100 UNIT/ML FlexPen, 3-5u, Disp: , Rfl:    Insulin  Glargine (TOUJEO MAX SOLOSTAR [place]), Inject 30 Units into the skin daily., Disp: , Rfl:    letrozole  (FEMARA ) 2.5 MG tablet, TAKE 1 TABLET BY MOUTH EVERY DAY, Disp: 90 tablet, Rfl: 1   pregabalin (LYRICA) 50 MG capsule, Take 50 mg by mouth 3 (three) times daily., Disp: , Rfl:     rosuvastatin (CRESTOR) 10 MG tablet, Take 10 mg by mouth at bedtime., Disp: , Rfl:    TOUJEO SOLOSTAR 300 UNIT/ML Solostar Pen, INJECT 30 UNITS SUBCUTANEOUSLY EVERY MORNING AND 15 UNITS SUBCUTANEOUSLY EVERY EVENING, Disp: , Rfl:    Physical Exam:   BP 128/81 (BP Location: Right Arm, Patient Position: Sitting, Cuff Size: Large)   Pulse 94   Ht 5' 8 (1.727 m)   Wt 216 lb (98 kg)   LMP 11/06/2016 (Exact Date)   SpO2 95%   BMI 32.84 kg/m   Salient findings:  CN II-XII intact  Bilateral EAC clear and TM intact with well pneumatized middle ear spaces Anterior rhinoscopy: Septum relatively midline; bilateral inferior turbinates without significant hypertrophy No lesions of oral cavity/oropharynx No respiratory distress or stridor Right perifacial node - close to angle of mandible, soft, mobile, well demarcated; no other palpable lymphadenopathy --- this is stable; TFL was indicated to better evaluate the proximal airway, given the patient's history and exam findings, and is detailed below.   Seprately Identifiable  Procedures:  Procedure Note Pre-procedure diagnosis: Persistent neck lymphadenopathy; rule out carcinoma Post-procedure diagnosis: Same Procedure: Transnasal Fiberoptic Laryngoscopy, CPT 31575 - Mod 25 Indication: see above Complications: None apparent EBL: 0 mL  The procedure was undertaken to further evaluate the patient's complaint above, with mirror exam inadequate for appropriate examination due to gag reflex and poor patient tolerance  Procedure:  Patient was identified as correct patient. Verbal consent was obtained. The nose was sprayed with oxymetazoline and 4% lidocaine . The The flexible laryngoscope was passed through the nose to view the nasal cavity, pharynx (oropharynx, hypopharynx) and larynx.  The larynx was examined at rest and during multiple phonatory tasks. Documentation was obtained and reviewed with patient. The scope was removed. The patient tolerated the  procedure well.  Findings: The nasal cavity and nasopharynx did not reveal any masses or lesions, mucosa appeared to be without obvious lesions. The tongue base, pharyngeal walls, piriform sinuses, vallecula, epiglottis and postcricoid region are normal in appearance. The visualized portion of the subglottis and proximal trachea is widely patent. The vocal folds are mobile bilaterally. There are no lesions on the free edge of the vocal folds nor elsewhere in the larynx worrisome for malignancy.    Electronically signed by: Eldora KATHEE Blanch, MD 02/09/2024 1:14 PM   Impression & Plans:  Robin Kennedy is a 48 y.o. female with h/o breast cancer now with:  1. Head and neck lymphadenopathy    Noted right submandibular lymph node after a URI; maybe slightly larger? Prior US  reassuring but given persistence, will bx.  no history of smoking  F/u in 6 weeks with FNA  See below regarding exact medications prescribed this encounter including dosages and route: No orders of the defined types were placed in this encounter.     Thank you for allowing me the opportunity to care for your patient. Please do not hesitate to contact me should you have any other questions.  Sincerely, Eldora Blanch, MD Otolaryngologist (ENT), [facility] Phone: [phone] Fax: [phone]  02/09/2024, 1:14 PM   I have personally spent 31 minutes involved in face-to-face and non-face-to-face activities for this patient on the day of the visit.  Professional time spent excludes any procedures performed but includes the following activities, in addition to those noted in the documentation: preparing to see the patient (review of outside documentation and results), performing a medically appropriate examination, counseling, documenting in the electronic health record

## 2024-02-12 NOTE — Progress Notes (Signed)
 Karalee Wilkie POUR, MD  [place] Approved for US  guided FNA bx of right submandibular LN to r/o metastatic disease.  HKM       Previous Messages    ----- Message ----- From: Zakk Borgen Sent: 02/09/2024   1:41 PM EDT To: Andrew Blasius; Ir Procedure Requests Subject: US  FNA BIOPSY SOFT TISSUE 1ST LESION          Procedure : US  FNA BIOPSY SOFT TISSUE 1ST LESION  Reason: right neck lymph node, persistent, rule out carcinoma Dx: Head and neck lymphadenopathy [R59.1 (ICD-10-CM)]    History : US  Soft tissue head and neck  Provider : Tobie Eldora NOVAK, MD  Contact :  [phone]

## 2024-02-26 ENCOUNTER — Ambulatory Visit (HOSPITAL_BASED_OUTPATIENT_CLINIC_OR_DEPARTMENT_OTHER)
Admission: RE | Admit: 2024-02-26 | Discharge: 2024-02-26 | Disposition: A | Discharge status: Home / Self Care | Source: Ambulatory Visit | Attending: Adult Health | Admitting: Adult Health

## 2024-02-26 DIAGNOSIS — Z79811 Long term (current) use of aromatase inhibitors: Secondary | ICD-10-CM | POA: Diagnosis present

## 2024-02-26 DIAGNOSIS — C50212 Malignant neoplasm of upper-inner quadrant of left female breast: Secondary | ICD-10-CM | POA: Diagnosis present

## 2024-02-26 DIAGNOSIS — Z13820 Encounter for screening for osteoporosis: Secondary | ICD-10-CM | POA: Insufficient documentation

## 2024-02-26 DIAGNOSIS — Z170 Estrogen receptor positive status [ER+]: Secondary | ICD-10-CM | POA: Diagnosis present

## 2024-02-26 DIAGNOSIS — E2839 Other primary ovarian failure: Secondary | ICD-10-CM | POA: Insufficient documentation

## 2024-02-28 ENCOUNTER — Ambulatory Visit: Payer: Self-pay | Admitting: *Deleted

## 2024-02-29 ENCOUNTER — Other Ambulatory Visit: Payer: Self-pay | Admitting: Radiology

## 2024-03-04 ENCOUNTER — Ambulatory Visit (HOSPITAL_COMMUNITY)
Admission: RE | Admit: 2024-03-04 | Discharge: 2024-03-04 | Disposition: A | Discharge status: Home / Self Care | Source: Ambulatory Visit | Attending: Otolaryngology | Admitting: Otolaryngology

## 2024-03-04 DIAGNOSIS — R591 Generalized enlarged lymph nodes: Secondary | ICD-10-CM | POA: Insufficient documentation

## 2024-03-04 DIAGNOSIS — Z853 Personal history of malignant neoplasm of breast: Secondary | ICD-10-CM | POA: Diagnosis present

## 2024-03-04 MED ORDER — LIDOCAINE HCL 1 % IJ SOLN
INTRAMUSCULAR | Status: AC
Start: 2024-03-04 — End: 2024-03-04
  Filled 2024-03-04: qty 20

## 2024-03-12 LAB — SURGICAL PATHOLOGY

## 2024-03-15 LAB — CYTOLOGY - NON PAP

## 2024-03-27 ENCOUNTER — Encounter (INDEPENDENT_AMBULATORY_CARE_PROVIDER_SITE_OTHER): Payer: Self-pay | Admitting: Otolaryngology

## 2024-03-27 ENCOUNTER — Ambulatory Visit (INDEPENDENT_AMBULATORY_CARE_PROVIDER_SITE_OTHER): Admitting: Otolaryngology

## 2024-03-27 VITALS — BP 127/82 | HR 93 | Ht 68.0 in | Wt 216.0 lb

## 2024-03-27 DIAGNOSIS — R591 Generalized enlarged lymph nodes: Secondary | ICD-10-CM | POA: Diagnosis not present

## 2024-03-27 NOTE — Progress Notes (Signed)
 Dear Dr. Corwin, Here is my assessment for our mutual patient, Robin Kennedy. Thank you for allowing me the opportunity to care for your patient. Please do not hesitate to contact me should you have any other questions. Sincerely, Dr. Eldora Blanch  Otolaryngology Clinic Note Referring provider: Dr. Corwin HPI:  Robin Kennedy is a 48 y.o. female kindly referred by Dr. Corwin for evaluation of submandibular lymphadenopathy.  Initial visit (10/2023): Noticed it first in February 2025, dentist noticed it and advised checking. Did have a URI right prior to noticing it (cold - cough, congestion, runny nose). She does report that it is about the same in size. No pain, no ear pain, dysphagia, odynophagia, hoarseness, SOB, hemoptysis, unintentional weight loss. No facial numbness. Does have hot flashes (going through menopause). No dental infections or issues.   No antibiotics for this, has not tried anything. Never had it biopsied. She has had a US  for this on 10/26/2023.   --------------------------------------------------------- 02/09/2024 Persistent lymphadenopathy; no additional adenopathy. No recent URI since last visit. No dysphagia, ear pain, odynophagia, hoarseness, hoarseness, SOB, hemoptysis, unintentional weight loss, skin cancers. No dental issues. Antibiotics did not help. --------------------------------------------------------- 03/27/2024 Persistent lymphadenopathy; no additional adenopathy; no recent URI; we did discuss her biopsy results. No dysphagia, ear pain, odynophagia, hoarseness, hoarseness, SOB, hemoptysis, unintentional weight loss, skin cancers.    H&N Robin: no Personal or FHx of bleeding dz or anesthesia difficulty: no  GLP-1: no AP/AC: no  Tobacco: no. Alcohol: no  PMHx: HLD, T2DM, Breast cancer  Independent Review of Additional Tests or Records:  CBC 10/25/2023: WBC 5.0, Hgb 12.9, Plt 197 Robin Corwin FNP notes 10/25/2023: seen at dentist, nodule  right side of her jaw after had a URI in Jan; some tenderness on palpatoin; no B symptoms; Dx: submandibular LAD; Rx: CBC and US  Neck 10/25/2023 independently reviewed:   Fatty hilum preserved  FNA 03/04/2024: lymphoid tissue, no carcinoma noted; no evidence of lymphoproliferative disorder  PMH/Meds/All/SocHx/FamHx/ROS:   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   Personal history of chemotherapy    ended 10-07-2020 and did herceptin  thru 06-23-2021   Personal history of radiation therapy    01-31-2021   SVD (spontaneous vaginal delivery)    X 3     Past Surgical History:  Procedure Laterality Date   BREAST BIOPSY Left 06/02/2020   x2   BREAST BIOPSY Right 06/22/2020   BREAST BIOPSY Right 10/2020   CYSTOSCOPY N/A 12/20/2016   Procedure: CYSTOSCOPY;  Surgeon: Corene Coy, MD;  Location: [facility];  Service: Gynecology;  Laterality: N/A;   MASTECTOMY Left    MODIFIED MASTECTOMY Left 11/09/2020   Procedure: LEFT MODIFIED MASTECTOMY WITH LYMPH NODE DISSECTION;  Surgeon: Aron Shoulders, MD;  Location: [facility];  Service: General;  Laterality: Left;   PORT-A-CATH REMOVAL Left 06/24/2021   Procedure: REMOVAL PORT-A-CATH;  Surgeon: Aron Shoulders, MD;  Location: [facility];  Service: General;  Laterality: Left;   PORTACATH PLACEMENT Left 06/23/2020   Procedure: INSERTION PORT-A-CATH WITH ULTRASOUND GUIDANCE;  Surgeon: Aron Shoulders, MD;  Location: [facility];  Service: General;  Laterality: Left;  RADIOACTIVE SEED GUIDED EXCISIONAL BREAST BIOPSY Right 11/09/2020   Procedure: RIGHT  BREAST SEED LOCALIZED EXCISIONAL BIOPSY;  Surgeon: Aron Shoulders, MD;  Location: [facility];  Service: General;  Laterality: Right;   ROBOTIC ASSISTED TOTAL HYSTERECTOMY WITH BILATERAL SALPINGO OOPHERECTOMY Bilateral 12/20/2016   Procedure: ROBOTIC ASSISTED TOTAL HYSTERECTOMY WITH BILATERAL SALPINGO OOPHORECTOMY;  Surgeon: Corene Coy, MD;  Location: [facility];  Service: Gynecology;  Laterality: Bilateral;   tooth removal  09/2021   TUBAL LIGATION     WISDOM TOOTH EXTRACTION      Family History  Problem Relation Age of Onset   Sarcoidosis Mother    Colon polyps Brother    Diabetes Brother    Hyperlipidemia Maternal Aunt    Stroke Maternal Uncle    Stroke Paternal Grandmother    Breast cancer Paternal Grandmother        unknown age   Asthma Daughter    GER disease Daughter    Colon cancer Neg Hx    Esophageal cancer Neg Hx    Rectal cancer Neg Hx    Stomach cancer Neg Hx      Social Connections: Socially Integrated (10/19/2023)   Social Connection and Isolation Panel    Frequency of Communication with Friends and Family: More than three times a week    Frequency of Social Gatherings with Friends and Family: Twice a week    Attends Religious Services: More than 4 times per year    Active Member of [employer] or Organizations: Yes    Attends Engineer, structural: More than 4 times per year    Marital Status: Married      Current Outpatient Medications:    BD PEN NEEDLE NANO 2ND GEN 32G X 4 MM MISC, USE 4 TIMES A DAY AS DIRECTED, Disp: , Rfl:    Continuous Blood Gluc Sensor (FREESTYLE LIBRE 2 SENSOR) MISC, Inject into the skin every 14 (fourteen) days., Disp: , Rfl:    empagliflozin  (JARDIANCE ) 10 MG TABS tablet, Take 1 tablet (10 mg total) by mouth daily before breakfast., Disp: 30 tablet, Rfl:    glucose blood (ONE TOUCH ULTRA TEST) test strip, Use to check cbgs qd, Disp: 100 each, Rfl: 11   HUMALOG KWIKPEN 100 UNIT/ML KwikPen, SMARTSIG:Unit(s) SUB-Q,  Disp: , Rfl:    insulin  aspart (NOVOLOG FLEXPEN) 100 UNIT/ML FlexPen, 3-5u, Disp: , Rfl:    Insulin  Glargine (TOUJEO MAX SOLOSTAR [place]), Inject 30 Units into the skin daily., Disp: , Rfl:    JARDIANCE  25 MG TABS tablet, Take 25 mg by mouth daily., Disp: , Rfl:    letrozole  (FEMARA ) 2.5 MG tablet, TAKE 1 TABLET BY MOUTH EVERY DAY, Disp: 90 tablet, Rfl: 1   pregabalin (LYRICA) 50 MG capsule, Take 50 mg by mouth 3 (three) times daily., Disp: , Rfl:    rosuvastatin (CRESTOR) 10 MG tablet, Take 10 mg by mouth at bedtime., Disp: , Rfl:    TOUJEO SOLOSTAR 300 UNIT/ML Solostar Pen, INJECT 30 UNITS SUBCUTANEOUSLY EVERY MORNING AND 15 UNITS SUBCUTANEOUSLY EVERY EVENING, Disp: , Rfl:    Physical Exam:   BP 127/82 (BP Location: Right Arm, Patient Position: Sitting, Cuff Size: Large)   Pulse 93   Ht 5' 8 (1.727 m)   Wt 216 lb (98 kg)   LMP 11/06/2016 (Exact Date)   SpO2 95%   BMI 32.84 kg/m   Salient findings:  CN II-XII intact Anterior rhinoscopy: Septum relatively midline; bilateral inferior turbinates without significant hypertrophy No lesions of oral cavity/oropharynx No  respiratory distress or stridor Right perifacial node - close to angle of mandible, soft, mobile, well demarcated; no other palpable lymphadenopathy --- this is stable   Seprately Identifiable Procedures:  Procedure Note (prior, not today) Pre-procedure diagnosis: Persistent neck lymphadenopathy; rule out carcinoma Post-procedure diagnosis: Same Procedure: Transnasal Fiberoptic Laryngoscopy, CPT 31575 - Mod 25 Indication: see above Complications: None apparent EBL: 0 mL  The procedure was undertaken to further evaluate the patient's complaint above, with mirror exam inadequate for appropriate examination due to gag reflex and poor patient tolerance  Procedure:  Patient was identified as correct patient. Verbal consent was obtained. The nose was sprayed with oxymetazoline and 4% lidocaine . The The flexible laryngoscope  was passed through the nose to view the nasal cavity, pharynx (oropharynx, hypopharynx) and larynx.  The larynx was examined at rest and during multiple phonatory tasks. Documentation was obtained and reviewed with patient. The scope was removed. The patient tolerated the procedure well.  Findings: The nasal cavity and nasopharynx did not reveal any masses or lesions, mucosa appeared to be without obvious lesions. The tongue base, pharyngeal walls, piriform sinuses, vallecula, epiglottis and postcricoid region are normal in appearance. The visualized portion of the subglottis and proximal trachea is widely patent. The vocal folds are mobile bilaterally. There are no lesions on the free edge of the vocal folds nor elsewhere in the larynx worrisome for malignancy.    Electronically signed by: Eldora KATHEE Blanch, MD 03/27/2024 9:17 AM   Impression & Plans:  Robin Kennedy is a 48 y.o. female with h/o breast cancer now with:  1. Head and neck lymphadenopathy    Noted right submandibular lymph node after a URI; FNA w/o carcinoma and now current US  with relatively stable size. no history of smoking  We discussed options: observation v/s excision  She opted for observation - f/u Feb with US   See below regarding exact medications prescribed this encounter including dosages and route: No orders of the defined types were placed in this encounter.     Thank you for allowing me the opportunity to care for your patient. Please do not hesitate to contact me should you have any other questions.  Sincerely, Eldora Blanch, MD Otolaryngologist (ENT), [facility] Phone: [phone] Fax: [phone]  03/27/2024, 9:17 AM   I have personally spent 30 minutes involved in face-to-face and non-face-to-face activities for this patient on the day of the visit.  Professional time spent excludes any procedures performed but includes the following activities, in addition to those noted in the  documentation: preparing to see the patient (review of outside documentation and results), interpretation of US  images (during FNA), performing a medically appropriate examination, extensive counseling, documenting in the electronic health record

## 2024-04-22 ENCOUNTER — Ambulatory Visit: Attending: General Surgery

## 2024-04-22 VITALS — Wt 219.2 lb

## 2024-04-22 DIAGNOSIS — Z483 Aftercare following surgery for neoplasm: Secondary | ICD-10-CM | POA: Insufficient documentation

## 2024-04-22 NOTE — Therapy (Signed)
 OUTPATIENT PHYSICAL THERAPY SOZO SCREENING NOTE   Patient Name: Robin Kennedy MRN: [id number] DOB:10-13-1975, 48 y.o., female Today's Date: 04/22/2024  PCP: Corwin Antu, FNP REFERRING PROVIDER: Aron Shoulders, MD   PT End of Session - 04/22/24 9196     Visit Number 16   # unchanged due to screen o nly   PT Start Time 0800    PT Stop Time 0804    PT Time Calculation (min) 4 min    Activity Tolerance Patient tolerated treatment well    Behavior During Therapy [facility] for tasks assessed/performed          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   Personal history of chemotherapy    ended 10-07-2020 and did herceptin  thru 06-23-2021   Personal history of radiation therapy    01-31-2021   SVD (spontaneous vaginal delivery)    X 3   Past Surgical History:  Procedure Laterality Date   BREAST BIOPSY Left 06/02/2020   x2   BREAST BIOPSY Right 06/22/2020   BREAST BIOPSY Right 10/2020   CYSTOSCOPY N/A 12/20/2016   Procedure: CYSTOSCOPY;  Surgeon: Corene Coy, MD;  Location: [facility];  Service: Gynecology;  Laterality: N/A;   MASTECTOMY Left    MODIFIED MASTECTOMY Left 11/09/2020   Procedure: LEFT MODIFIED MASTECTOMY WITH LYMPH NODE DISSECTION;  Surgeon: Aron Shoulders, MD;  Location: [facility];  Service: General;  Laterality: Left;   PORT-A-CATH REMOVAL Left 06/24/2021   Procedure: REMOVAL PORT-A-CATH;  Surgeon: Aron Shoulders, MD;  Location[facility];  Service: General;  Laterality: Left;   PORTACATH PLACEMENT Left 06/23/2020   Procedure: INSERTION PORT-A-CATH WITH ULTRASOUND  GUIDANCE;  Surgeon: Aron Shoulders, MD;  Location: [facility];  Service: General;  Laterality: Left;   RADIOACTIVE SEED GUIDED EXCISIONAL BREAST BIOPSY Right 11/09/2020   Procedure: RIGHT BREAST SEED LOCALIZED EXCISIONAL BIOPSY;  Surgeon: Aron Shoulders, MD;  Location: [facility];  Service: General;  Laterality: Right;   ROBOTIC ASSISTED TOTAL HYSTERECTOMY WITH BILATERAL SALPINGO OOPHERECTOMY Bilateral 12/20/2016   Procedure: ROBOTIC ASSISTED TOTAL HYSTERECTOMY WITH BILATERAL SALPINGO OOPHORECTOMY;  Surgeon: Corene Coy, MD;  Location: [facility];  Service: Gynecology;  Laterality: Bilateral;   tooth removal  09/2021   TUBAL LIGATION     WISDOM TOOTH EXTRACTION     Patient Active Problem List   Diagnosis Date Noted   Seasonal allergic rhinitis due to pollen 10/25/2023   History of breast cancer 07/27/2023   Epidermoid cyst of finger of left hand 04/12/2022   Elevated LDL cholesterol level 03/22/2022   Obesity due to excess calories 09/01/2021   Nontoxic goiter 09/01/2021   Malignant neoplasm of upper-inner quadrant of left breast in female, 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   Controlled type 2 diabetes mellitus without complication, with long-term current use of insulin  (HCC) 06/19/2012    REFERRING DIAG: right breast cancer at risk for lymphedema  THERAPY DIAG: Aftercare following surgery for neoplasm  PERTINENT HISTORY: Patient was diagnosed on 06/02/2020 with left grade III invasive ductal carcinoma breast cancer. It is ER positive, PR negative, and HER2 positive with a Ki67 of 60%.  She underwent neoadjuvant chemotherapy 06/25/2020-10/07/2020 followed by a right lumpectomy and a left mastectomy with an axillary lymph node dissection (19 negative nodes removed) on 11/09/2020. She has type II diabetes.  PRECAUTIONS: right UE Lymphedema risk, None  SUBJECTIVE: Pt returns for her 6 month L-Dex screen.   PAIN:  Are you having pain? No  SOZO  SCREENING: Patient was assessed today using the SOZO machine to determine the lymphedema index score. This was compared to her baseline score. It was determined that she is within the recommended range when compared to her baseline and no further action is needed at this time. She will continue SOZO screenings. These are done every 3 months for 2 years post operatively followed by every 6 months for 2 years, and then annually. Also advised pt to get a compression bra from [facility] as she reports she has been noticing some chest swelling since returning to work baking cakes. Info was given for her to call them and reach out to nurse navigator for a script. Pt verbalized good understanding.      L-DEX FLOWSHEETS - 04/22/24 0800       L-DEX LYMPHEDEMA SCREENING   Measurement Type Unilateral    L-DEX MEASUREMENT EXTREMITY Upper Extremity    POSITION  Standing    DOMINANT SIDE Right    At Risk Side Left    BASELINE SCORE (UNILATERAL) 4.7    L-DEX SCORE (UNILATERAL) 7.9    VALUE CHANGE (UNILAT) 3.2         P: Cont 6 month screen and then can be annual.    Aden Berwyn Caldron, PTA 04/22/2024, 8:04 AM

## 2024-05-14 ENCOUNTER — Other Ambulatory Visit: Payer: Self-pay | Admitting: *Deleted

## 2024-05-14 MED ORDER — LETROZOLE 2.5 MG PO TABS: 2.5000 mg | ORAL_TABLET | Freq: Every day | ORAL | 1 refills | Status: DC

## 2024-06-20 ENCOUNTER — Other Ambulatory Visit: Payer: Self-pay | Admitting: Hematology and Oncology

## 2024-06-25 ENCOUNTER — Encounter: Payer: Self-pay | Admitting: Obstetrics & Gynecology

## 2024-06-25 ENCOUNTER — Ambulatory Visit (INDEPENDENT_AMBULATORY_CARE_PROVIDER_SITE_OTHER): Admitting: Obstetrics & Gynecology

## 2024-06-25 VITALS — BP 120/75 | HR 109 | Ht 68.0 in | Wt 223.5 lb

## 2024-06-25 DIAGNOSIS — C50212 Malignant neoplasm of upper-inner quadrant of left female breast: Secondary | ICD-10-CM

## 2024-06-25 DIAGNOSIS — Z90710 Acquired absence of both cervix and uterus: Secondary | ICD-10-CM

## 2024-06-25 DIAGNOSIS — Z170 Estrogen receptor positive status [ER+]: Secondary | ICD-10-CM

## 2024-06-25 DIAGNOSIS — Z7689 Persons encountering health services in other specified circumstances: Secondary | ICD-10-CM

## 2024-06-25 DIAGNOSIS — R102 Pelvic and perineal pain unspecified side: Secondary | ICD-10-CM | POA: Diagnosis not present

## 2024-06-25 NOTE — OR Nursing (Signed)
 Received call from Dr. Herchel requesting for procedure to be changed on Operative record from 12/20/2016 to reflect Robotic Assisted Abdominal Hysterectomy with Bilateral Salpingectomy. OR Record update to reflect correct procedure performed per Dr. Herchel.

## 2024-06-25 NOTE — Progress Notes (Signed)
 GYNECOLOGY OFFICE VISIT NOTE  History:  Robin Kennedy is a 48 y.o. [phone] here today to re-establish care with me today.  She was my patient in the past, but was referred to Dr. Frederick (my partner) to undergo robot assisted hysterectomy 2018. Dr. Corene was her gynecologist, but recently left the system, so she is back to establish care with me.  She has had significant health changes in the interim:  was diagnosed with Stage IIIB ER+, PR-, HER2+ breast cancer in 2021.  She is s/p mastectomy, radiation and chemotherapy and currently on Letrozole  anti-estrogen therapy.  She reports occasional hot flashes and night sweats, also decreased libido but does not want any intervention now.  Of note, her FSH on 03/11/2021 was 101.  She also reports intermittent lower right sided pelvic pain, she is concerned about her ovaries and wants this evaluated.  She denies any abnormal vaginal discharge, bleeding or other concerns.  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   Personal history of chemotherapy    ended 10-07-2020 and did herceptin  thru 06-23-2021   Personal history of radiation therapy    01-31-2021   SVD (spontaneous vaginal delivery)    X 3    Past Surgical History:  Procedure Laterality Date   BREAST BIOPSY Left 06/02/2020   x2   BREAST BIOPSY Right 06/22/2020   BREAST BIOPSY Right 10/2020   CYSTOSCOPY N/A 12/20/2016   Procedure: CYSTOSCOPY;  Surgeon: Corene Coy, MD;  Location: [facility];  Service: Gynecology;  Laterality: N/A;   MASTECTOMY Left    MODIFIED MASTECTOMY Left 11/09/2020    Procedure: LEFT MODIFIED MASTECTOMY WITH LYMPH NODE DISSECTION;  Surgeon: Aron Shoulders, MD;  Location: [facility];  Service: General;  Laterality: Left;   PORT-A-CATH REMOVAL Left 06/24/2021   Procedure: REMOVAL PORT-A-CATH;  Surgeon: Aron Shoulders, MD;  [facility];  Service: General;  Laterality: Left;   PORTACATH PLACEMENT Left 06/23/2020   Procedure: INSERTION PORT-A-CATH WITH ULTRASOUND GUIDANCE;  Surgeon: Aron Shoulders, MD;  Location: [facility];  Service: General;  Laterality: Left;   RADIOACTIVE SEED GUIDED EXCISIONAL BREAST BIOPSY Right 11/09/2020   Procedure: RIGHT BREAST SEED LOCALIZED EXCISIONAL BIOPSY;  Surgeon: Aron Shoulders, MD;  [facility];  Service: General;  Laterality: Right;   ROBOTIC ASSISTED TOTAL HYSTERECTOMY WITH BILATERAL SALPINGO OOPHERECTOMY Bilateral 12/20/2016   OVARIES STILL IN PLACE. Bilateral salpingectomy done, not salpingoophorectomy   Tooth removal  09/2021   TUBAL LIGATION     WISDOM TOOTH EXTRACTION     The following portions of the patient's history were reviewed and updated as appropriate: allergies, current medications, past family history, past medical history, past social history, past surgical history and problem list.   Review of Systems:  Pertinent items noted in HPI and remainder of comprehensive ROS otherwise negative.  Physical Exam:  BP 120/75   Pulse (!) 109   Ht 5' 8 (1.727 m)   Wt 223 lb 8 oz (101.4 kg)   LMP 11/06/2016 (Exact Date)   BMI 33.98 kg/m  CONSTITUTIONAL: Well-developed, well-nourished female in no acute distress.  HEENT:  Normocephalic, atraumatic. External right and left ear normal. No scleral icterus.  NECK: Normal range of motion, supple, no masses noted on observation SKIN: No rash noted. Not diaphoretic. No erythema. No pallor. MUSCULOSKELETAL: Normal range of motion. No edema noted. NEUROLOGIC: Alert and oriented to person, place, and time. Normal muscle  tone coordination. No cranial nerve deficit noted on observation. PSYCHIATRIC: Normal mood and affect. Normal behavior. Normal judgment and thought content. CARDIOVASCULAR: Normal heart rate noted RESPIRATORY: Effort and breath sounds normal, no problems with respiration noted ABDOMEN: No masses or other overt distention noted on observation. No tenderness on exam.   PELVIC: Deferred   Assessment and Plan:     1. Pelvic pain Pelvic ultrasound ordered to evaluate ovaries, will follow up results and manage accordingly. Ovaries should be very small given her postmenopausal state, but will see what the ultrasound shows. Pain is likely musculoskeletal. - US  PELVIC COMPLETE WITH TRANSVAGINAL; Future  2. Malignant neoplasm of upper-inner quadrant of left breast in female, estrogen receptor positive (HCC) 3. S/P total hysterectomy and bilateral salpingectomy Patient's surgery in 2018 was erroneously entered as robotic total hysterectomy, bilateral salpingo-oophorectomy; she just had bilateral salpingectomy (only uterus, cervix and fallopian tubes were removed and this was confirmed upon reading operative note and pathology report).  Patient is menopausal, but sent a message to her oncologist (Dr. Odean) to make sure that there is no concern with her ovaries remaining in place given her ER+, PR-, HER2+ status.  He felt there was no reason to recommend oophorectomy at this point given her menopausal state. Discussed this with patient.   3. Encounter to re-establish care (Primary) Welcomed patient back to our office here at [facility]. Told her we were always available for any GYN concerns.   Please refer to After Visit Summary for other counseling recommendations.   Return for any gynecologic concerns.    I spent 45 minutes dedicated to the care of this patient including pre-visit review of records, face to face time with the patient discussing her conditions and treatments, post visit ordering  of medications and appropriate tests or procedures, coordinating care, discussing care with her oncologist and documenting this visit encounter.    GLORIS HUGGER, MD, FACOG Obstetrician & Gynecologist, [facility] for [employer], [facility]

## 2024-06-28 ENCOUNTER — Ambulatory Visit: Payer: Self-pay | Admitting: Obstetrics & Gynecology

## 2024-06-28 ENCOUNTER — Ambulatory Visit (HOSPITAL_BASED_OUTPATIENT_CLINIC_OR_DEPARTMENT_OTHER)
Admission: RE | Admit: 2024-06-28 | Discharge: 2024-06-28 | Disposition: A | Discharge status: Home / Self Care | Source: Ambulatory Visit | Attending: Obstetrics & Gynecology | Admitting: Obstetrics & Gynecology

## 2024-06-28 DIAGNOSIS — R102 Pelvic and perineal pain unspecified side: Secondary | ICD-10-CM | POA: Diagnosis present

## 2024-06-28 DIAGNOSIS — Z170 Estrogen receptor positive status [ER+]: Secondary | ICD-10-CM | POA: Insufficient documentation

## 2024-06-28 DIAGNOSIS — C50212 Malignant neoplasm of upper-inner quadrant of left female breast: Secondary | ICD-10-CM | POA: Insufficient documentation

## 2024-08-14 ENCOUNTER — Encounter: Admitting: Obstetrics and Gynecology

## 2024-08-27 ENCOUNTER — Other Ambulatory Visit: Payer: Self-pay | Admitting: Endocrinology

## 2024-08-27 DIAGNOSIS — E049 Nontoxic goiter, unspecified: Secondary | ICD-10-CM

## 2024-08-27 DIAGNOSIS — E1165 Type 2 diabetes mellitus with hyperglycemia: Secondary | ICD-10-CM

## 2024-09-03 ENCOUNTER — Ambulatory Visit
Admission: RE | Admit: 2024-09-03 | Discharge: 2024-09-03 | Disposition: A | Source: Ambulatory Visit | Attending: Endocrinology | Admitting: Endocrinology

## 2024-09-03 DIAGNOSIS — E049 Nontoxic goiter, unspecified: Secondary | ICD-10-CM

## 2024-09-03 DIAGNOSIS — E1165 Type 2 diabetes mellitus with hyperglycemia: Secondary | ICD-10-CM

## 2024-10-01 ENCOUNTER — Ambulatory Visit (INDEPENDENT_AMBULATORY_CARE_PROVIDER_SITE_OTHER): Admitting: Otolaryngology

## 2024-10-21 ENCOUNTER — Ambulatory Visit

## 2024-10-30 ENCOUNTER — Ambulatory Visit: Admitting: Hematology and Oncology
